# Patient Record
Sex: Female | Born: 1955 | Race: Black or African American | Hispanic: No | Marital: Single | Smoking: Former smoker
Health system: Southern US, Community
[De-identification: ages and names within clinical notes are randomized; demographics above are authoritative.]

## PROBLEM LIST (undated history)

## (undated) DIAGNOSIS — F419 Anxiety disorder, unspecified: Secondary | ICD-10-CM

## (undated) DIAGNOSIS — G4733 Obstructive sleep apnea (adult) (pediatric): Secondary | ICD-10-CM

## (undated) DIAGNOSIS — E119 Type 2 diabetes mellitus without complications: Secondary | ICD-10-CM

## (undated) DIAGNOSIS — H409 Unspecified glaucoma: Secondary | ICD-10-CM

## (undated) DIAGNOSIS — F32A Depression, unspecified: Secondary | ICD-10-CM

## (undated) DIAGNOSIS — R0600 Dyspnea, unspecified: Secondary | ICD-10-CM

## (undated) DIAGNOSIS — I509 Heart failure, unspecified: Secondary | ICD-10-CM

## (undated) DIAGNOSIS — I5030 Unspecified diastolic (congestive) heart failure: Secondary | ICD-10-CM

## (undated) DIAGNOSIS — I671 Cerebral aneurysm, nonruptured: Secondary | ICD-10-CM

## (undated) DIAGNOSIS — G4700 Insomnia, unspecified: Secondary | ICD-10-CM

## (undated) DIAGNOSIS — R079 Chest pain, unspecified: Secondary | ICD-10-CM

## (undated) DIAGNOSIS — I10 Essential (primary) hypertension: Secondary | ICD-10-CM

## (undated) DIAGNOSIS — G43109 Migraine with aura, not intractable, without status migrainosus: Secondary | ICD-10-CM

## (undated) DIAGNOSIS — K219 Gastro-esophageal reflux disease without esophagitis: Secondary | ICD-10-CM

## (undated) DIAGNOSIS — R011 Cardiac murmur, unspecified: Secondary | ICD-10-CM

## (undated) DIAGNOSIS — R32 Unspecified urinary incontinence: Secondary | ICD-10-CM

## (undated) DIAGNOSIS — J45909 Unspecified asthma, uncomplicated: Secondary | ICD-10-CM

## (undated) DIAGNOSIS — E785 Hyperlipidemia, unspecified: Secondary | ICD-10-CM

## (undated) DIAGNOSIS — J302 Other seasonal allergic rhinitis: Secondary | ICD-10-CM

## (undated) DIAGNOSIS — D649 Anemia, unspecified: Secondary | ICD-10-CM

## (undated) DIAGNOSIS — G629 Polyneuropathy, unspecified: Secondary | ICD-10-CM

## (undated) DIAGNOSIS — Z87442 Personal history of urinary calculi: Secondary | ICD-10-CM

## (undated) DIAGNOSIS — I219 Acute myocardial infarction, unspecified: Secondary | ICD-10-CM

## (undated) DIAGNOSIS — R51 Headache: Secondary | ICD-10-CM

## (undated) DIAGNOSIS — Z7982 Long term (current) use of aspirin: Secondary | ICD-10-CM

## (undated) DIAGNOSIS — G459 Transient cerebral ischemic attack, unspecified: Secondary | ICD-10-CM

## (undated) DIAGNOSIS — R519 Headache, unspecified: Secondary | ICD-10-CM

## (undated) DIAGNOSIS — F329 Major depressive disorder, single episode, unspecified: Secondary | ICD-10-CM

## (undated) DIAGNOSIS — M1990 Unspecified osteoarthritis, unspecified site: Secondary | ICD-10-CM

## (undated) DIAGNOSIS — N189 Chronic kidney disease, unspecified: Secondary | ICD-10-CM

## (undated) DIAGNOSIS — J449 Chronic obstructive pulmonary disease, unspecified: Secondary | ICD-10-CM

## (undated) DIAGNOSIS — G4730 Sleep apnea, unspecified: Secondary | ICD-10-CM

## (undated) DIAGNOSIS — M7581 Other shoulder lesions, right shoulder: Secondary | ICD-10-CM

## (undated) DIAGNOSIS — N183 Chronic kidney disease, stage 3 unspecified: Secondary | ICD-10-CM

## (undated) DIAGNOSIS — M1612 Unilateral primary osteoarthritis, left hip: Secondary | ICD-10-CM

## (undated) DIAGNOSIS — I639 Cerebral infarction, unspecified: Secondary | ICD-10-CM

## (undated) DIAGNOSIS — S46009A Unspecified injury of muscle(s) and tendon(s) of the rotator cuff of unspecified shoulder, initial encounter: Secondary | ICD-10-CM

## (undated) DIAGNOSIS — Z7901 Long term (current) use of anticoagulants: Secondary | ICD-10-CM

## (undated) HISTORY — PX: TOTAL KNEE ARTHROPLASTY: SHX125

## (undated) HISTORY — DX: Chest pain, unspecified: R07.9

## (undated) HISTORY — PX: CHOLECYSTECTOMY: SHX55

## (undated) HISTORY — DX: Unspecified diastolic (congestive) heart failure: I50.30

## (undated) HISTORY — PX: CARDIAC CATHETERIZATION: SHX172

## (undated) HISTORY — PX: JOINT REPLACEMENT: SHX530

## (undated) HISTORY — PX: TONSILLECTOMY: SUR1361

---

## 2003-02-05 DIAGNOSIS — I10 Essential (primary) hypertension: Secondary | ICD-10-CM | POA: Insufficient documentation

## 2003-12-22 ENCOUNTER — Other Ambulatory Visit: Payer: Self-pay

## 2004-01-18 ENCOUNTER — Other Ambulatory Visit: Payer: Self-pay

## 2004-06-12 ENCOUNTER — Other Ambulatory Visit: Payer: Self-pay

## 2004-10-07 ENCOUNTER — Emergency Department: Payer: Self-pay | Admitting: Emergency Medicine

## 2004-12-12 ENCOUNTER — Ambulatory Visit: Payer: Self-pay | Admitting: Anesthesiology

## 2004-12-14 ENCOUNTER — Ambulatory Visit: Payer: Self-pay | Admitting: Anesthesiology

## 2005-04-12 ENCOUNTER — Emergency Department: Payer: Self-pay | Admitting: Emergency Medicine

## 2005-09-05 ENCOUNTER — Other Ambulatory Visit: Payer: Self-pay

## 2005-09-05 ENCOUNTER — Inpatient Hospital Stay: Payer: Self-pay | Admitting: Internal Medicine

## 2005-09-21 ENCOUNTER — Emergency Department: Payer: Self-pay | Admitting: Emergency Medicine

## 2005-09-21 ENCOUNTER — Other Ambulatory Visit: Payer: Self-pay

## 2005-12-10 ENCOUNTER — Emergency Department: Payer: Self-pay | Admitting: General Practice

## 2006-07-07 ENCOUNTER — Emergency Department: Payer: Self-pay | Admitting: Emergency Medicine

## 2006-08-03 ENCOUNTER — Emergency Department: Payer: Self-pay | Admitting: Emergency Medicine

## 2006-08-07 ENCOUNTER — Emergency Department: Payer: Self-pay | Admitting: Internal Medicine

## 2006-09-16 ENCOUNTER — Emergency Department: Payer: Self-pay | Admitting: Emergency Medicine

## 2006-10-19 ENCOUNTER — Ambulatory Visit: Payer: Self-pay | Admitting: Nurse Practitioner

## 2007-01-22 ENCOUNTER — Emergency Department: Payer: Self-pay | Admitting: Emergency Medicine

## 2007-01-22 ENCOUNTER — Other Ambulatory Visit: Payer: Self-pay

## 2007-02-01 ENCOUNTER — Other Ambulatory Visit: Payer: Self-pay

## 2007-02-01 ENCOUNTER — Emergency Department: Payer: Self-pay | Admitting: Emergency Medicine

## 2007-05-25 ENCOUNTER — Emergency Department: Payer: Self-pay | Admitting: Emergency Medicine

## 2007-06-16 ENCOUNTER — Emergency Department: Payer: Self-pay | Admitting: Internal Medicine

## 2007-08-30 ENCOUNTER — Emergency Department: Payer: Self-pay | Admitting: Emergency Medicine

## 2008-01-14 ENCOUNTER — Emergency Department: Payer: Self-pay | Admitting: Emergency Medicine

## 2008-01-16 ENCOUNTER — Emergency Department (HOSPITAL_COMMUNITY): Admission: EM | Admit: 2008-01-16 | Discharge: 2008-01-16 | Payer: Self-pay | Admitting: Emergency Medicine

## 2008-01-30 ENCOUNTER — Ambulatory Visit (HOSPITAL_COMMUNITY): Admission: RE | Admit: 2008-01-30 | Discharge: 2008-01-30 | Payer: Self-pay | Admitting: Orthopedic Surgery

## 2008-03-24 IMAGING — CT CT CERVICAL SPINE WITHOUT CONTRAST
3 series · 16 of 33 positions shown, 19 images · non-contrast
Comparison: none

REASON FOR EXAM: pain
COMMENTS:   LMP: Post-Menopausal

PROCEDURE:     CT  - CT CERVICAL SPINE WO  - August 31, 2007 [DATE]
RESULT:
HISTORY: Pain.

[Series 3: axial · axial · 0.30mm/px · z∈[-310,-162]mm · 8 of 94 slices shown, 10 images]
[im 8/94  soft-tissue]
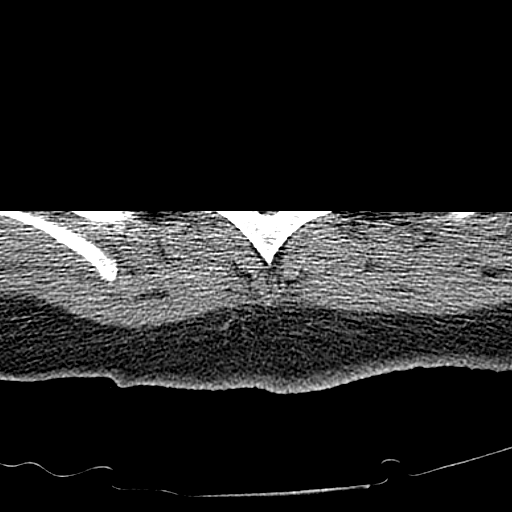
[im 8/94  bone]
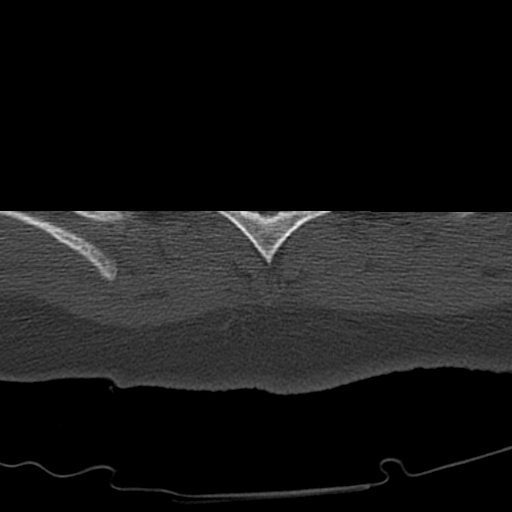
[im 22/94  bone]
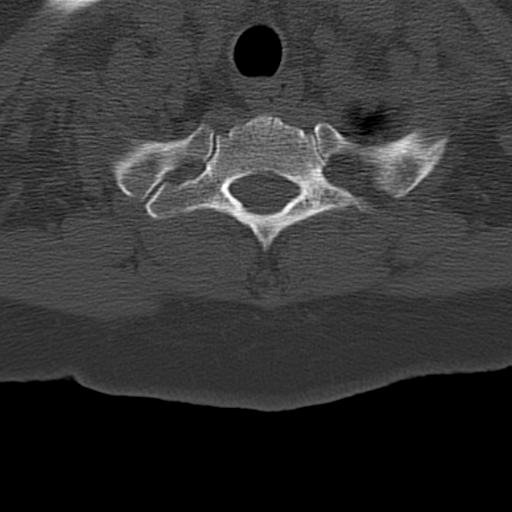
[im 29/94  bone]
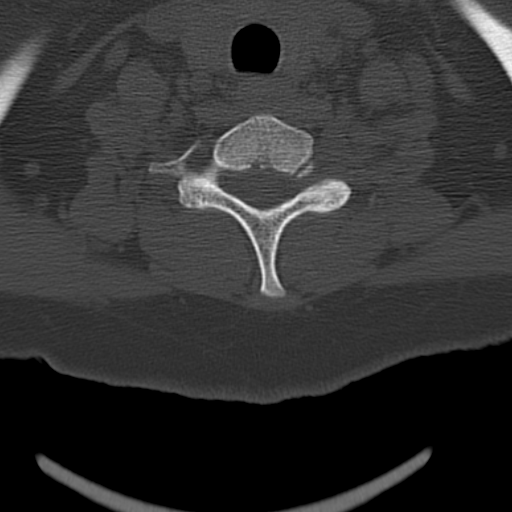
[im 43/94  bone]
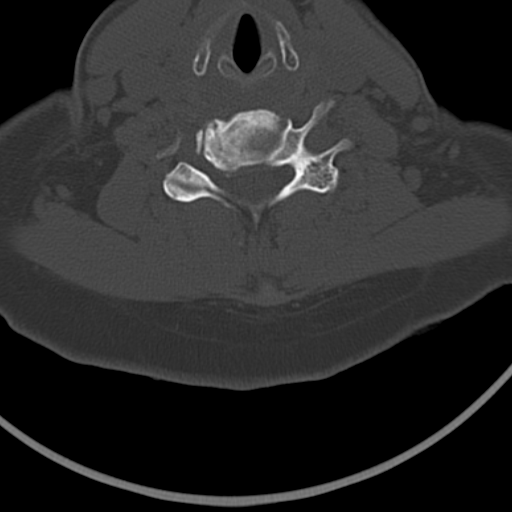
[im 51/94  soft-tissue]
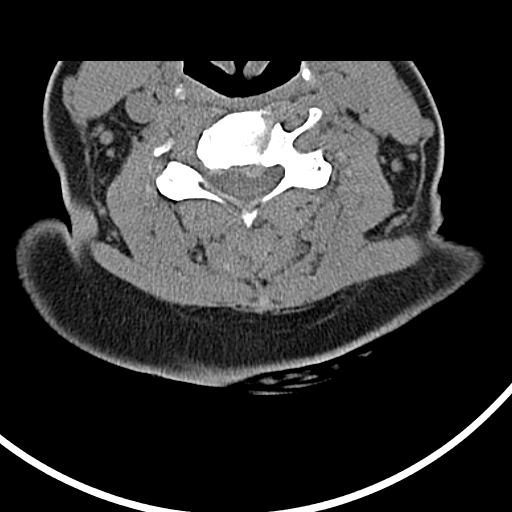
[im 51/94  bone]
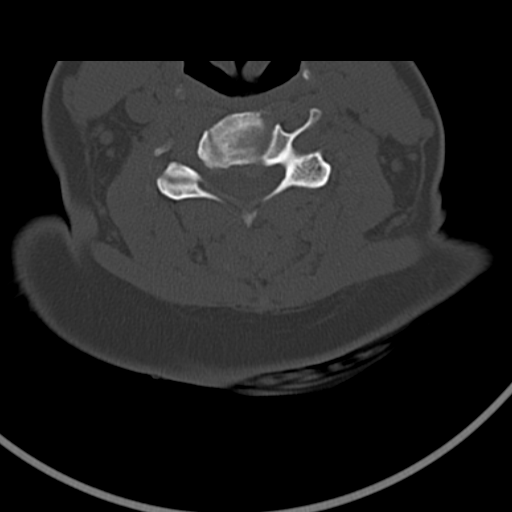
[im 65/94  bone]
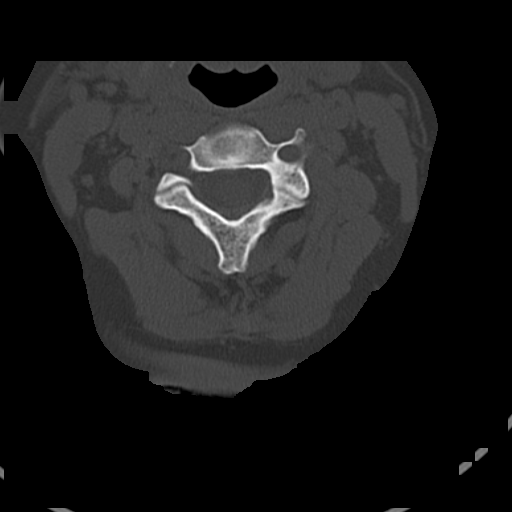
[im 72/94  bone]
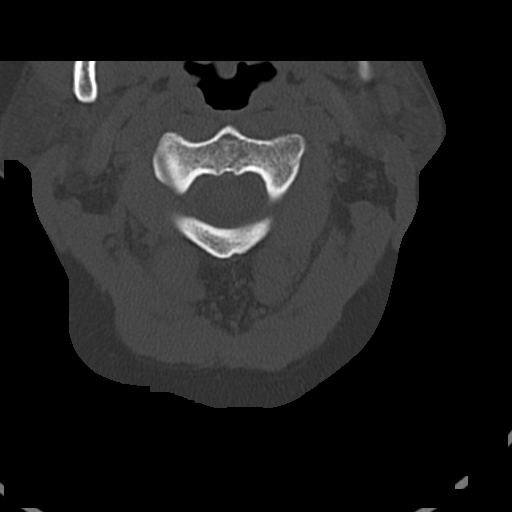
[im 86/94  bone]
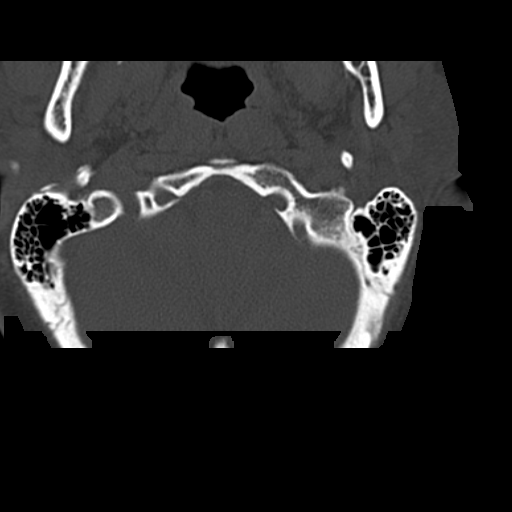

[Series 4: coronal · coronal · 0.46mm/px · 3 of 37 slices shown]
[im 8/37  bone]
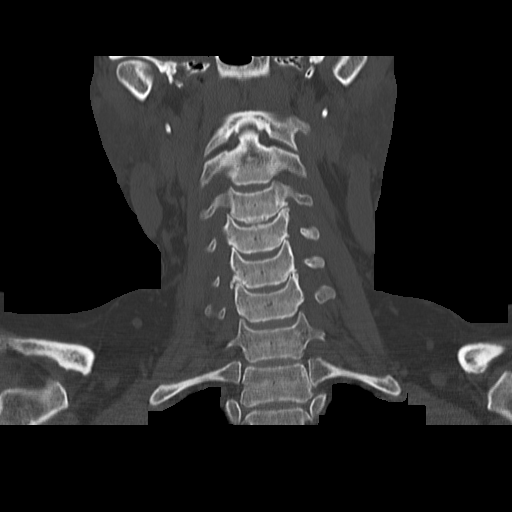
[im 15/37  bone]
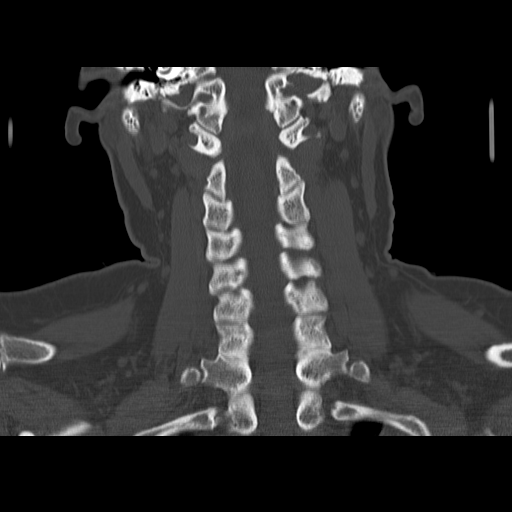
[im 22/37  bone]
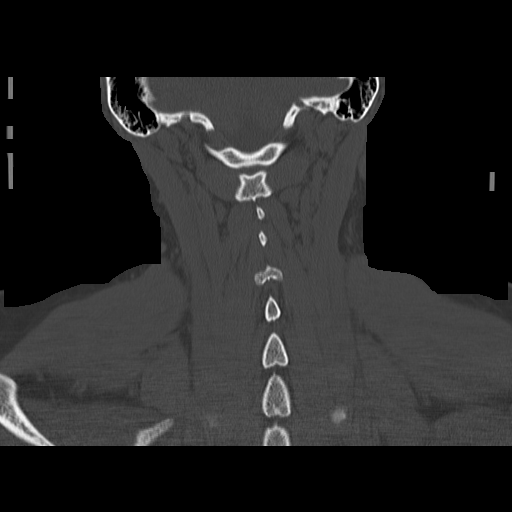

[Series 5: sagittal · sagittal · 0.46mm/px · 5 of 41 slices shown, 6 images]
[im 11/41  bone]
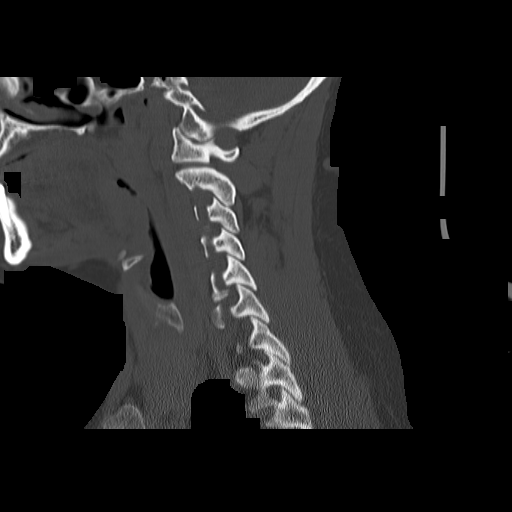
[im 14/41  bone]
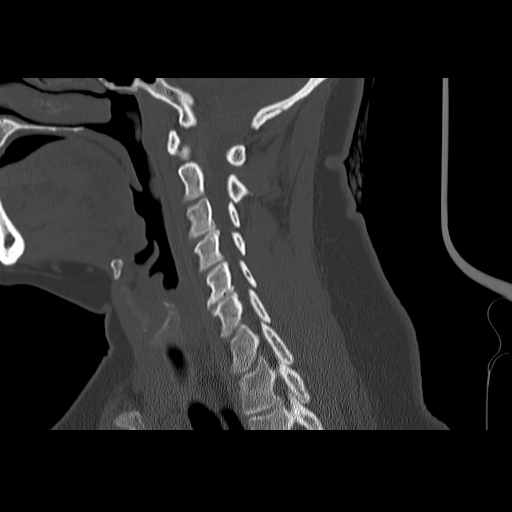
[im 17/41  bone]
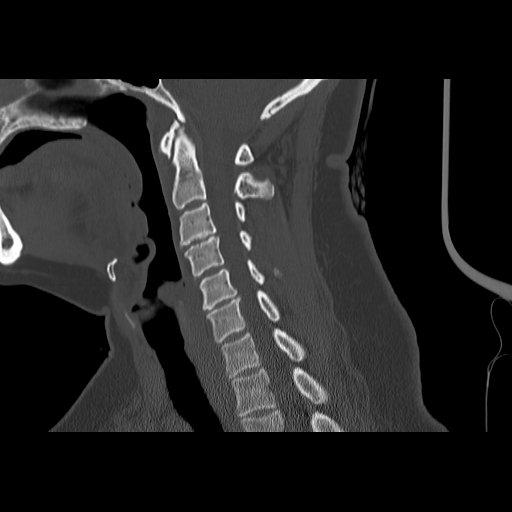
[im 21/41  soft-tissue]
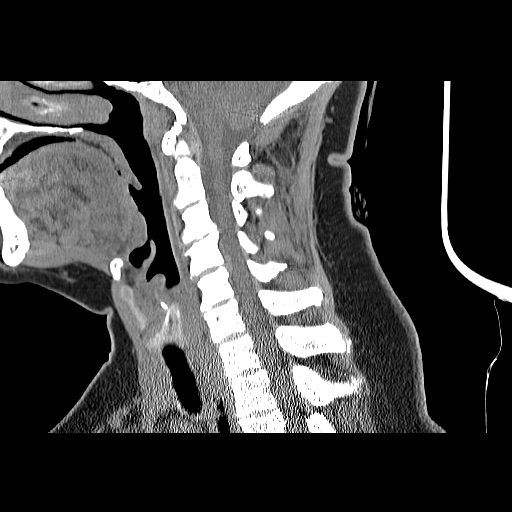
[im 21/41  bone]
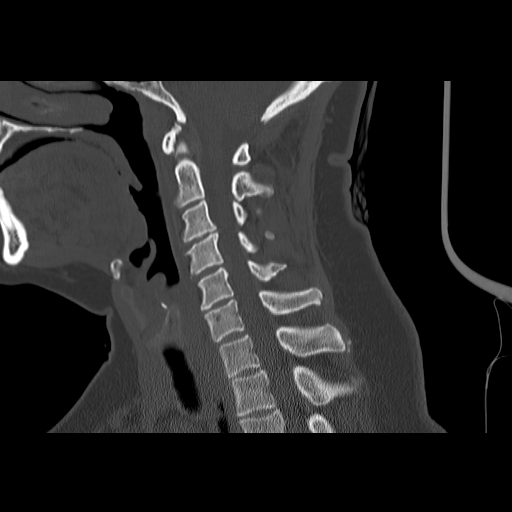
[im 27/41  bone]
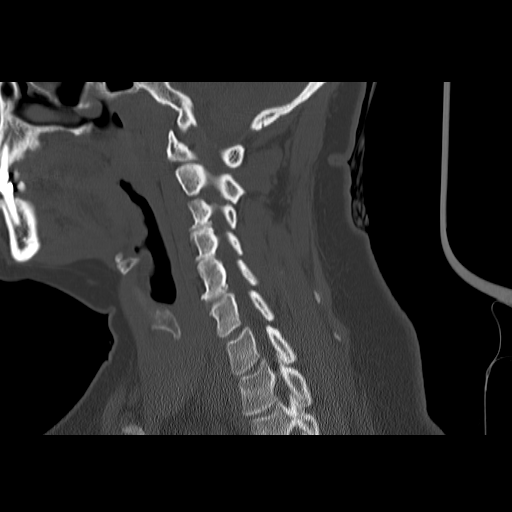

[16 of 33 positions shown; findings below may reference images not displayed]

COMPARISON STUDIES: No recent.

PROCEDURE AND FINDINGS: Standard CT of the cervical spine is obtained. No
evidence of displaced fracture. Diffuse degenerative change is noted.
Multilevel disc degeneration with degenerative endplate osteophyte formation
is noted. Multilevel annular bulge cannot be excluded.
IMPRESSION: 1)Diffuse severe degenerative changes of the cervical spine. MRI may prove
useful for further evaluation.

2)No acute abnormality.

## 2008-03-27 ENCOUNTER — Emergency Department: Payer: Self-pay | Admitting: Unknown Physician Specialty

## 2008-03-27 ENCOUNTER — Other Ambulatory Visit: Payer: Self-pay

## 2008-06-10 ENCOUNTER — Emergency Department: Payer: Self-pay | Admitting: Unknown Physician Specialty

## 2008-09-24 ENCOUNTER — Ambulatory Visit: Payer: Self-pay | Admitting: Family Medicine

## 2008-10-06 ENCOUNTER — Observation Stay: Payer: Self-pay | Admitting: *Deleted

## 2008-11-13 HISTORY — PX: BRAIN SURGERY: SHX531

## 2009-01-06 ENCOUNTER — Emergency Department: Payer: Self-pay | Admitting: Emergency Medicine

## 2009-01-31 ENCOUNTER — Emergency Department: Payer: Self-pay | Admitting: Emergency Medicine

## 2009-04-05 DIAGNOSIS — I725 Aneurysm of other precerebral arteries: Secondary | ICD-10-CM

## 2009-04-05 HISTORY — DX: Aneurysm of other precerebral arteries: I72.5

## 2009-06-01 ENCOUNTER — Ambulatory Visit: Payer: Self-pay | Admitting: Internal Medicine

## 2009-08-13 ENCOUNTER — Emergency Department: Payer: Self-pay | Admitting: Unknown Physician Specialty

## 2009-09-05 ENCOUNTER — Emergency Department: Payer: Self-pay | Admitting: Emergency Medicine

## 2010-04-20 ENCOUNTER — Emergency Department: Payer: Self-pay | Admitting: Emergency Medicine

## 2010-05-21 ENCOUNTER — Emergency Department: Payer: Self-pay | Admitting: Emergency Medicine

## 2010-08-24 ENCOUNTER — Ambulatory Visit: Payer: Self-pay | Admitting: Family Medicine

## 2010-09-14 ENCOUNTER — Ambulatory Visit: Payer: Self-pay | Admitting: Family Medicine

## 2010-12-07 ENCOUNTER — Ambulatory Visit: Payer: Self-pay

## 2010-12-13 ENCOUNTER — Ambulatory Visit: Payer: Self-pay

## 2011-01-29 ENCOUNTER — Emergency Department: Payer: Self-pay | Admitting: Emergency Medicine

## 2011-02-05 ENCOUNTER — Emergency Department: Payer: Self-pay | Admitting: Internal Medicine

## 2011-07-24 ENCOUNTER — Emergency Department: Payer: Self-pay | Admitting: Emergency Medicine

## 2011-09-10 ENCOUNTER — Inpatient Hospital Stay: Payer: Self-pay | Admitting: Internal Medicine

## 2011-09-11 DIAGNOSIS — Z9889 Other specified postprocedural states: Secondary | ICD-10-CM

## 2011-09-11 DIAGNOSIS — I2510 Atherosclerotic heart disease of native coronary artery without angina pectoris: Secondary | ICD-10-CM

## 2011-09-11 HISTORY — DX: Atherosclerotic heart disease of native coronary artery without angina pectoris: I25.10

## 2011-09-11 HISTORY — PX: LEFT HEART CATH AND CORONARY ANGIOGRAPHY: CATH118249

## 2011-09-11 HISTORY — DX: Other specified postprocedural states: Z98.890

## 2011-10-11 ENCOUNTER — Ambulatory Visit: Payer: Self-pay | Admitting: Family Medicine

## 2011-11-01 ENCOUNTER — Ambulatory Visit: Payer: Self-pay | Admitting: Gastroenterology

## 2011-11-16 ENCOUNTER — Emergency Department: Payer: Self-pay | Admitting: Emergency Medicine

## 2011-12-26 ENCOUNTER — Encounter: Payer: Self-pay | Admitting: Family Medicine

## 2012-01-12 ENCOUNTER — Encounter: Payer: Self-pay | Admitting: Family Medicine

## 2012-02-12 ENCOUNTER — Encounter: Payer: Self-pay | Admitting: Family Medicine

## 2012-04-21 ENCOUNTER — Emergency Department: Payer: Self-pay | Admitting: Emergency Medicine

## 2012-06-20 ENCOUNTER — Ambulatory Visit: Payer: Self-pay | Admitting: Specialist

## 2012-06-20 LAB — CBC WITH DIFFERENTIAL/PLATELET
Basophil %: 0.5 %
Eosinophil #: 0.2 10*3/uL (ref 0.0–0.7)
Eosinophil %: 6.9 %
HCT: 40 % (ref 35.0–47.0)
HGB: 13.1 g/dL (ref 12.0–16.0)
Lymphocyte #: 1.2 10*3/uL (ref 1.0–3.6)
MCH: 28 pg (ref 26.0–34.0)
MCHC: 32.7 g/dL (ref 32.0–36.0)
MCV: 85 fL (ref 80–100)
Monocyte #: 0.3 x10 3/mm (ref 0.2–0.9)
Neutrophil #: 1.5 10*3/uL (ref 1.4–6.5)
Neutrophil %: 44.9 %
RBC: 4.68 10*6/uL (ref 3.80–5.20)

## 2012-06-20 LAB — BASIC METABOLIC PANEL
BUN: 15 mg/dL (ref 7–18)
Creatinine: 1.19 mg/dL (ref 0.60–1.30)
EGFR (Non-African Amer.): 50 — ABNORMAL LOW
Glucose: 97 mg/dL (ref 65–99)
Potassium: 3.7 mmol/L (ref 3.5–5.1)
Sodium: 143 mmol/L (ref 136–145)

## 2012-06-27 ENCOUNTER — Ambulatory Visit: Payer: Self-pay | Admitting: Specialist

## 2012-06-28 LAB — PATHOLOGY REPORT

## 2012-10-14 ENCOUNTER — Ambulatory Visit: Payer: Self-pay | Admitting: Family Medicine

## 2012-12-19 ENCOUNTER — Emergency Department: Payer: Self-pay | Admitting: Emergency Medicine

## 2013-02-10 ENCOUNTER — Emergency Department: Payer: Self-pay | Admitting: Emergency Medicine

## 2013-02-11 LAB — URINALYSIS, COMPLETE
Glucose,UR: NEGATIVE mg/dL (ref 0–75)
Leukocyte Esterase: NEGATIVE
WBC UR: 4 /HPF (ref 0–5)

## 2013-11-13 ENCOUNTER — Emergency Department: Payer: Self-pay | Admitting: Emergency Medicine

## 2013-11-13 LAB — RAPID INFLUENZA A&B ANTIGENS

## 2014-03-20 ENCOUNTER — Emergency Department: Payer: Self-pay | Admitting: Emergency Medicine

## 2014-11-04 ENCOUNTER — Ambulatory Visit: Payer: Self-pay | Admitting: Family Medicine

## 2015-03-02 NOTE — Op Note (Signed)
PATIENT NAME:  Kristine Rangel, Vylette A MR#:  [id number] DATE OF BIRTH:  [birth year]  DATE OF PROCEDURE:  06/27/2012  PREOPERATIVE DIAGNOSIS: Recurrent volar radial ganglion, left wrist.  POSTOPERATIVE DIAGNOSIS: Recurrent volar radial ganglion, left wrist.  PROCEDURE PERFORMED:  Excision of deep volar radial ganglion, left wrist.   SURGEON: Valinda HoarHoward E. Maxwell Lemen, M.D.   ANESTHESIA: General endotracheal.   COMPLICATIONS: None.   DRAINS: None.   DESCRIPTION OF PROCEDURE: The patient was brought to the Operating Room where she underwent satisfactory general endotracheal anesthesia in the supine position. The left arm was prepped and draped in sterile fashion and an Esmarch applied. The tourniquet was inflated to 250 mmHg. Tourniquet times were 23 minutes followed by a second session of 17 minutes. The previous longitudinal incision was reopened carefully and blunt dissection carried out. She had a large recurrent ganglion which was adherent to surrounding tissues. The flexor radialis tendon sheath was opened and the tendon retracted. The radial artery was identified proximal and distal to the cyst and was found to be totally adherent to the cyst and involving the cyst capsule. This was carefully dissected out away from the ganglion itself and isolated with a vessel loop drain. A small hole in the vessel wall was identified during this process due to the extreme friability of the vessel. The cyst was then followed distally and deep down into the radiocarpal joint. It was excised along with a portion of the capsule. The rongeur was used to remove any remaining tissue in the capsule. The wound was irrigated. Sponges were applied to the wound and the tourniquet was deflated with good return of blood flow to the hand. After releasing the pressure on the artery, there was some bleeding from a hole in the artery. The tourniquet was reinflated for the second 17 minutes and a small hole in the vessel wall was repaired with  6-0 Prolene suture. The tourniquet was deflated again and there was no appreciable bleeding at all and there was good pulsation. The wound was again irrigated. There was no appreciable bleeding. Electrocautery had been used on small veins. The wound was then closed with 4-0 Vicryl and 5-0 nylon, 0.5% Marcaine was placed in the wound, and a dry sterile compression hand dressing and volar splint was applied. The tourniquet was already deflated and there was excellent circulation in the hand. The patient was awakened and taken to recovery in good condition.  ____________________________ Valinda HoarHoward E. Camary Sosa, MD hem:slb D: 06/27/2012 10:46:11 ET T: 06/27/2012 13:33:17 ET JOB#: [id number]  cc: Valinda HoarHoward E. Infant Doane, MD, <Dictator> Valinda HoarHOWARD E Sahmya Arai MD ELECTRONICALLY SIGNED 06/28/2012 10:59

## 2015-05-05 ENCOUNTER — Emergency Department: Payer: Medicare Other

## 2015-05-05 ENCOUNTER — Encounter: Payer: Self-pay | Admitting: Emergency Medicine

## 2015-05-05 ENCOUNTER — Emergency Department
Admission: EM | Admit: 2015-05-05 | Discharge: 2015-05-05 | Disposition: A | Discharge status: Home / Self Care | Payer: Medicare Other | Attending: Emergency Medicine | Admitting: Emergency Medicine

## 2015-05-05 ENCOUNTER — Other Ambulatory Visit: Payer: Self-pay

## 2015-05-05 DIAGNOSIS — M79602 Pain in left arm: Secondary | ICD-10-CM | POA: Diagnosis not present

## 2015-05-05 DIAGNOSIS — I10 Essential (primary) hypertension: Secondary | ICD-10-CM | POA: Diagnosis not present

## 2015-05-05 DIAGNOSIS — R079 Chest pain, unspecified: Secondary | ICD-10-CM | POA: Diagnosis not present

## 2015-05-05 DIAGNOSIS — E119 Type 2 diabetes mellitus without complications: Secondary | ICD-10-CM | POA: Insufficient documentation

## 2015-05-05 DIAGNOSIS — M542 Cervicalgia: Secondary | ICD-10-CM | POA: Diagnosis not present

## 2015-05-05 DIAGNOSIS — Z880 Allergy status to penicillin: Secondary | ICD-10-CM | POA: Diagnosis not present

## 2015-05-05 DIAGNOSIS — Z87891 Personal history of nicotine dependence: Secondary | ICD-10-CM | POA: Insufficient documentation

## 2015-05-05 DIAGNOSIS — I252 Old myocardial infarction: Secondary | ICD-10-CM | POA: Insufficient documentation

## 2015-05-05 HISTORY — DX: Gastro-esophageal reflux disease without esophagitis: K21.9

## 2015-05-05 HISTORY — DX: Essential (primary) hypertension: I10

## 2015-05-05 HISTORY — DX: Hyperlipidemia, unspecified: E78.5

## 2015-05-05 HISTORY — DX: Acute myocardial infarction, unspecified: I21.9

## 2015-05-05 HISTORY — DX: Type 2 diabetes mellitus without complications: E11.9

## 2015-05-05 HISTORY — DX: Unspecified asthma, uncomplicated: J45.909

## 2015-05-05 HISTORY — DX: Cerebral infarction, unspecified: I63.9

## 2015-05-05 LAB — CBC
HEMATOCRIT: 41 % (ref 35.0–47.0)
HEMOGLOBIN: 12.9 g/dL (ref 12.0–16.0)
MCH: 27.1 pg (ref 26.0–34.0)
MCHC: 31.6 g/dL — ABNORMAL LOW (ref 32.0–36.0)
MCV: 86 fL (ref 80.0–100.0)
PLATELETS: 280 10*3/uL (ref 150–440)
RBC: 4.77 MIL/uL (ref 3.80–5.20)
RDW: 15 % — AB (ref 11.5–14.5)
WBC: 4.3 10*3/uL (ref 3.6–11.0)

## 2015-05-05 LAB — BASIC METABOLIC PANEL
ANION GAP: 7 (ref 5–15)
BUN: 21 mg/dL — ABNORMAL HIGH (ref 6–20)
CHLORIDE: 111 mmol/L (ref 101–111)
CO2: 24 mmol/L (ref 22–32)
CREATININE: 1.03 mg/dL — AB (ref 0.44–1.00)
Calcium: 9.9 mg/dL (ref 8.9–10.3)
GFR calc non Af Amer: 57 mL/min — ABNORMAL LOW (ref >60)
Glucose, Bld: 94 mg/dL (ref 65–99)
POTASSIUM: 3.8 mmol/L (ref 3.5–5.1)
SODIUM: 142 mmol/L (ref 135–145)

## 2015-05-05 LAB — TROPONIN I

## 2015-05-05 MED ORDER — NITROGLYCERIN 0.4 MG SL SUBL
SUBLINGUAL_TABLET | SUBLINGUAL | Status: AC
  Filled 2015-05-05: qty 1

## 2015-05-05 MED ORDER — NITROGLYCERIN 0.4 MG SL SUBL
0.4000 mg | SUBLINGUAL_TABLET | SUBLINGUAL | Status: DC | PRN
  Administered 2015-05-05: 0.4 mg via SUBLINGUAL

## 2015-05-05 MED ORDER — IOHEXOL 240 MG/ML SOLN: 25.0000 mL | Freq: Once | INTRAMUSCULAR | Status: DC | PRN

## 2015-05-05 MED ORDER — IOHEXOL 350 MG/ML SOLN
100.0000 mL | Freq: Once | INTRAVENOUS | Status: AC | PRN
  Administered 2015-05-05: 100 mL via INTRAVENOUS

## 2015-05-05 NOTE — ED Notes (Signed)
Patient c/o left sided sharp chest pain since Sunday. Radiates to left arm, neck and back. Hx MI; states this feels similar.

## 2015-05-05 NOTE — ED Notes (Signed)
Pt reports that she has had left arm, neck and chest pain x 3-4 days.  Reports increased pain in her neck and arm with raising her arm up.  nontender on palpation.  Pt ambulatory from wheelchair to bed without difficulty.  Pt drove herself here.  Denies change in pain since Sunday.

## 2015-05-05 NOTE — ED Provider Notes (Signed)
[facility] Emergency Department Provider Note  ____________________________________________  Time seen: 12:30 PM  I have reviewed the triage vital signs and the nursing notes.   HISTORY  Chief Complaint Chest Pain    HPI Kristine Rangel is a 59 y.o. female who complains of chest pain for 4 days. The pain was rapid in onset, feels sharp in her central chest radiating to her back and left arm. It hurts when she moves her left arm. It is not exertional or pleuritic. No fever chills cough shortness of breath diaphoresis or vomiting. She does feel slightly nauseated. She reports having a heart attack in the 90s, and although she does not remember the circumstances of that or what was done are found at the time, she remembers that this feels similar. She follows up with Dr. call would for cardiology, and has an appointment on July 5.  The pain is moderate in intensity at this time. No aggravating or alleviating factors. Only associated with nausea.     Past Medical History  Diagnosis Date  . MI (myocardial infarction)   . Hypertension   . Hyperlipidemia   . Diabetes mellitus without complication   . GERD (gastroesophageal reflux disease)   . Stroke     TIA's    There are no active problems to display for this patient.   Past Surgical History  Procedure Laterality Date  . Brain surgery      anuerysm repair  . Joint replacement      bilateral knees    No current outpatient prescriptions on file.  Allergies Penicillins  No family history on file.  Social History History  Substance Use Topics  . Smoking status: Former Games developer  . Smokeless tobacco: Not on file  . Alcohol Use: No    Review of Systems  Constitutional: No fever or chills. No weight changes Eyes:No blurry vision or double vision.  ENT: No sore throat. Cardiovascular: Chest pain as above. Respiratory: No dyspnea or cough. Gastrointestinal: Negative for abdominal pain, vomiting  and diarrhea.  No BRBPR or melena. Genitourinary: Negative for dysuria, urinary retention, bloody urine, or difficulty urinating. Musculoskeletal: Negative for back pain. No joint swelling or pain. Skin: Negative for rash. Neurological: Negative for headaches, focal weakness or numbness. Psychiatric:No anxiety or depression.   Endocrine:No hot/cold intolerance, changes in energy, or sleep difficulty.  10-point ROS otherwise negative.  ____________________________________________   PHYSICAL EXAM:  VITAL SIGNS: ED Triage Vitals  Enc Vitals Group     BP 05/05/15 1117 118/59 mmHg     Pulse Rate 05/05/15 1117 75     Resp 05/05/15 1117 15     Temp 05/05/15 1117 97.5 F (36.4 C)     Temp Source 05/05/15 1117 Oral     SpO2 05/05/15 1117 97 %     Weight 05/05/15 1117 275 lb (124.739 kg)     Height 05/05/15 1117 5\' 5"  (1.651 m)     Head Cir --      Peak Flow --      Pain Score 05/05/15 1112 10     Pain Loc --      Pain Edu? --      Excl. in GC? --      Constitutional: Alert and oriented. Well appearing and in no distress. Eyes: No scleral icterus. No conjunctival pallor. PERRL. EOMI ENT   Head: Normocephalic and atraumatic.   Nose: No congestion/rhinnorhea. No septal hematoma   Mouth/Throat: MMM, no pharyngeal erythema. No peritonsillar mass.  No uvula shift.   Neck: No stridor. No SubQ emphysema. No meningismus. Hematological/Lymphatic/Immunilogical: No cervical lymphadenopathy. Cardiovascular: RRR. Normal bilateral dorsalis pedis pulses. Normal 2+ right radial pulse, nonpalpable left radial pulse, with good cap refill and warmth in the left hand. Bilateral upper extremity blood pressures are equivalent around 130/60.Marland Kitchen No murmurs, rubs, or gallops. Respiratory: Normal respiratory effort without tachypnea nor retractions. Breath sounds are clear and equal bilaterally. No wheezes/rales/rhonchi. Chest wall nontender Gastrointestinal: Soft and nontender. No distention.  There is no CVA tenderness.  No rebound, rigidity, or guarding. Genitourinary: deferred Musculoskeletal: Left neck tender with spasm of left trapezius. No head turning or deviation. Pain increased with abduction of left arm above head. normal range of motion in all extremities. No joint effusions.  No lower extremity tenderness.  No edema. Neurologic:   Normal speech and language.  CN 2-10 normal. Motor grossly intact. No pronator drift.  Normal gait. No gross focal neurologic deficits are appreciated.  Skin:  Skin is warm, dry and intact. No rash noted.  No petechiae, purpura, or bullae. Psychiatric: Mood and affect are normal. Speech and behavior are normal. Patient exhibits appropriate insight and judgment.  ____________________________________________    LABS (pertinent positives/negatives) (all labs ordered are listed, but only abnormal results are displayed) Labs Reviewed  BASIC METABOLIC PANEL - Abnormal; Notable for the following:    BUN 21 (*)    Creatinine, Ser 1.03 (*)    GFR calc non Af Amer 57 (*)    All other components within normal limits  CBC - Abnormal; Notable for the following:    MCHC 31.6 (*)    RDW 15.0 (*)    All other components within normal limits  TROPONIN I   ____________________________________________   EKG  Interpreted by me  Date: 05/05/2015  Rate: 76  Rhythm: normal sinus rhythm  QRS Axis: normal  Intervals: normal  ST/T Wave abnormalities: normal  Conduction Disutrbances: none  Narrative Interpretation: unremarkable      ____________________________________________    RADIOLOGY  CT angiography chest unremarkable  ____________________________________________   PROCEDURES  ____________________________________________   INITIAL IMPRESSION / ASSESSMENT AND PLAN / ED COURSE  Pertinent labs & imaging results that were available during my care of the patient were reviewed by me and considered in my medical decision making  (see chart for details).  Patient presents with constant chest pain for 4 days. By history this is highly unlikely to be cardiac in nature, but there is a concern that it could be due to aortic dissection. Low suspicion of PE as she has completely normal vital signs with a heart rate of 60, respiratory rate of 12, oxygen saturation of 100%, blood pressure 120/70 and symptoms are not pleuritic and there is no shortness of breath. We will do troponins and CT angiogram of the chest to evaluate. We'll give her nitroglycerin to attempt symptom relief in the meantime. ----------------------------------------- 3:27 PM on 05/05/2015 -----------------------------------------  Workup negative. Troponin negative 2. Chest pain improving. Very likely musculoskeletal in nature given the exam findings and the negative workup. Patient will try gentle range of motion exercises and follow up with cardiology as scheduled for continued monitoring of her symptoms. ____________________________________________   FINAL CLINICAL IMPRESSION(S) / ED DIAGNOSES  Final diagnoses:  Chest pain at rest   musculoskeletal pain in the chest neck and left arm    Sharman Cheek, MD 05/05/15 1529

## 2015-05-05 NOTE — ED Notes (Signed)
AAOx3.  Skin warm and dry.  Ambulates with easy, steady gait with cane.  No SOB/ DOE.  D/C home

## 2015-05-05 NOTE — Discharge Instructions (Signed)

## 2015-08-04 ENCOUNTER — Other Ambulatory Visit: Payer: Self-pay | Admitting: Orthopedic Surgery

## 2015-08-04 DIAGNOSIS — M5033 Other cervical disc degeneration, cervicothoracic region: Secondary | ICD-10-CM

## 2015-08-13 ENCOUNTER — Ambulatory Visit: Admission: RE | Admit: 2015-08-13 | Payer: Medicare Other | Source: Ambulatory Visit

## 2015-08-17 ENCOUNTER — Ambulatory Visit: Admission: RE | Admit: 2015-08-17 | Payer: Medicare Other | Source: Ambulatory Visit

## 2015-08-18 ENCOUNTER — Ambulatory Visit
Admission: RE | Admit: 2015-08-18 | Discharge: 2015-08-18 | Disposition: A | Discharge status: Home / Self Care | Payer: Medicare Other | Source: Ambulatory Visit | Attending: Orthopedic Surgery | Admitting: Orthopedic Surgery

## 2015-08-18 DIAGNOSIS — M50222 Other cervical disc displacement at C5-C6 level: Secondary | ICD-10-CM | POA: Insufficient documentation

## 2015-08-18 DIAGNOSIS — M5033 Other cervical disc degeneration, cervicothoracic region: Secondary | ICD-10-CM | POA: Insufficient documentation

## 2015-08-18 DIAGNOSIS — M4802 Spinal stenosis, cervical region: Secondary | ICD-10-CM | POA: Insufficient documentation

## 2015-10-12 ENCOUNTER — Encounter: Payer: Self-pay | Admitting: Emergency Medicine

## 2015-10-12 ENCOUNTER — Emergency Department
Admission: EM | Admit: 2015-10-12 | Discharge: 2015-10-12 | Disposition: A | Discharge status: Home / Self Care | Payer: Medicare Other | Attending: Emergency Medicine | Admitting: Emergency Medicine

## 2015-10-12 DIAGNOSIS — Z880 Allergy status to penicillin: Secondary | ICD-10-CM | POA: Diagnosis not present

## 2015-10-12 DIAGNOSIS — Y93F2 Activity, caregiving, lifting: Secondary | ICD-10-CM | POA: Insufficient documentation

## 2015-10-12 DIAGNOSIS — Y9289 Other specified places as the place of occurrence of the external cause: Secondary | ICD-10-CM | POA: Insufficient documentation

## 2015-10-12 DIAGNOSIS — M5440 Lumbago with sciatica, unspecified side: Secondary | ICD-10-CM

## 2015-10-12 DIAGNOSIS — X501XXA Overexertion from prolonged static or awkward postures, initial encounter: Secondary | ICD-10-CM | POA: Insufficient documentation

## 2015-10-12 DIAGNOSIS — Y998 Other external cause status: Secondary | ICD-10-CM | POA: Insufficient documentation

## 2015-10-12 DIAGNOSIS — I10 Essential (primary) hypertension: Secondary | ICD-10-CM | POA: Insufficient documentation

## 2015-10-12 DIAGNOSIS — S4991XA Unspecified injury of right shoulder and upper arm, initial encounter: Secondary | ICD-10-CM | POA: Diagnosis present

## 2015-10-12 DIAGNOSIS — S46911A Strain of unspecified muscle, fascia and tendon at shoulder and upper arm level, right arm, initial encounter: Secondary | ICD-10-CM | POA: Insufficient documentation

## 2015-10-12 DIAGNOSIS — Z87891 Personal history of nicotine dependence: Secondary | ICD-10-CM | POA: Insufficient documentation

## 2015-10-12 DIAGNOSIS — E119 Type 2 diabetes mellitus without complications: Secondary | ICD-10-CM | POA: Insufficient documentation

## 2015-10-12 MED ORDER — DIAZEPAM 5 MG/ML IJ SOLN
10.0000 mg | Freq: Once | INTRAMUSCULAR | Status: AC
  Administered 2015-10-12: 10 mg via INTRAMUSCULAR
  Filled 2015-10-12: qty 2

## 2015-10-12 MED ORDER — KETOROLAC TROMETHAMINE 60 MG/2ML IM SOLN
60.0000 mg | Freq: Once | INTRAMUSCULAR | Status: AC
  Administered 2015-10-12: 60 mg via INTRAMUSCULAR
  Filled 2015-10-12: qty 2

## 2015-10-12 MED ORDER — BACLOFEN 10 MG PO TABS: 10.0000 mg | ORAL_TABLET | Freq: Three times a day (TID) | ORAL | Status: DC

## 2015-10-12 MED ORDER — MELOXICAM 15 MG PO TABS: 15.0000 mg | ORAL_TABLET | Freq: Every day | ORAL | Status: DC

## 2015-10-12 MED ORDER — HYDROCODONE-ACETAMINOPHEN 5-325 MG PO TABS: 1.0000 | ORAL_TABLET | ORAL | Status: DC | PRN

## 2015-10-12 NOTE — Discharge Instructions (Signed)
Chronic Back Pain ° When back pain lasts longer than 3 months, it is called chronic back pain. People with chronic back pain often go through certain periods that are more intense (flare-ups).  °CAUSES °Chronic back pain can be caused by wear and tear (degeneration) on different structures in your back. These structures include: °· The bones of your spine (vertebrae) and the joints surrounding your spinal cord and nerve roots (facets). °· The strong, fibrous tissues that connect your vertebrae (ligaments). °Degeneration of these structures may result in pressure on your nerves. This can lead to constant pain. °HOME CARE INSTRUCTIONS °· Avoid bending, heavy lifting, prolonged sitting, and activities which make the problem worse. °· Take brief periods of rest throughout the day to reduce your pain. Lying down or standing usually is better than sitting while you are resting. °· Take over-the-counter or prescription medicines only as directed by your caregiver. °SEEK IMMEDIATE MEDICAL CARE IF:  °· You have weakness or numbness in one of your legs or feet. °· You have trouble controlling your bladder or bowels. °· You have nausea, vomiting, abdominal pain, shortness of breath, or fainting. °  °This information is not intended to replace advice given to you by your health care provider. Make sure you discuss any questions you have with your health care provider. °  °Document Released: 12/07/2004 Document Revised: 01/22/2012 Document Reviewed: 04/19/2015 °Elsevier Interactive Patient Education ©2016 [employer] ° °Sciatica °Sciatica is pain, weakness, numbness, or tingling along your sciatic nerve. The nerve starts in the lower back and runs down the back of each leg. Nerve damage or certain conditions pinch or put pressure on the sciatic nerve. This causes the pain, weakness, and other discomforts of sciatica. °HOME CARE  °· Only take medicine as told by your doctor. °· Apply ice to the affected area for 20 minutes. Do  this 3-4 times a day for the first 48-72 hours. Then try heat in the same way. °· Exercise, stretch, or do your usual activities if these do not make your pain worse. °· Go to physical therapy as told by your doctor. °· Keep all doctor visits as told. °· Do not wear high heels or shoes that are not supportive. °· Get a firm mattress if your mattress is too soft to lessen pain and discomfort. °GET HELP RIGHT AWAY IF:  °· You cannot control when you poop (bowel movement) or pee (urinate). °· You have more weakness in your lower back, lower belly (pelvis), butt (buttocks), or legs. °· You have redness or puffiness (swelling) of your back. °· You have a burning feeling when you pee. °· You have pain that gets worse when you lie down. °· You have pain that wakes you from your sleep. °· Your pain is worse than past pain. °· Your pain lasts longer than 4 weeks. °· You are suddenly losing weight without reason. °MAKE SURE YOU:  °· Understand these instructions. °· Will watch this condition. °· Will get help right away if you are not doing well or get worse. °  °This information is not intended to replace advice given to you by your health care provider. Make sure you discuss any questions you have with your health care provider. °  °Document Released: 08/08/2008 Document Revised: 07/21/2015 Document Reviewed: 03/10/2012 °Elsevier Interactive Patient Education ©2016 Elsevier Inc. ° °

## 2015-10-12 NOTE — ED Provider Notes (Signed)
[facility] Emergency Department Provider Note  ____________________________________________  Time seen: Approximately 2:11 PM  I have reviewed the triage vital signs and the nursing notes.   HISTORY  Chief Complaint Back Pain    HPI Kristine Rangel is a 59 y.o. female presents for lower back pain, right shoulder pain. Patient reports past medical history significant for bulging disks. Denies any trauma other than lifting all over the Thanksgiving weekend and putting up Christmas decorations.   Past Medical History  Diagnosis Date  . MI (myocardial infarction) (HCC)   . Hypertension   . Hyperlipidemia   . Diabetes mellitus without complication (HCC)   . GERD (gastroesophageal reflux disease)   . Stroke (HCC)     TIA's  . Asthma     There are no active problems to display for this patient.   Past Surgical History  Procedure Laterality Date  . Brain surgery      anuerysm repair  . Joint replacement      bilateral knees    Current Outpatient Rx  Name  Route  Sig  Dispense  Refill  . baclofen (LIORESAL) 10 MG tablet   Oral   Take 1 tablet (10 mg total) by mouth 3 (three) times daily.   30 tablet   0   . HYDROcodone-acetaminophen (NORCO) 5-325 MG tablet   Oral   Take 1-2 tablets by mouth every 4 (four) hours as needed for moderate pain.   15 tablet   0   . meloxicam (MOBIC) 15 MG tablet   Oral   Take 1 tablet (15 mg total) by mouth daily.   30 tablet   0     Allergies Penicillins  No family history on file.  Social History Social History  Substance Use Topics  . Smoking status: Former Games developermoker  . Smokeless tobacco: None  . Alcohol Use: No    Review of Systems Constitutional: No fever/chills Eyes: No visual changes. ENT: No sore throat. Cardiovascular: Denies chest pain. Respiratory: Denies shortness of breath. Gastrointestinal: No abdominal pain.  No nausea, no vomiting.  No diarrhea.  No constipation. Genitourinary:  Negative for dysuria. Musculoskeletal: Positive for bilateral lower back pain. Positive for right shoulder pain. Skin: Negative for rash. Neurological: Negative for headaches, focal weakness or numbness.  10-point ROS otherwise negative.  ____________________________________________   PHYSICAL EXAM:  VITAL SIGNS: ED Triage Vitals  Enc Vitals Group     BP 10/12/15 1301 126/57 mmHg     Pulse Rate 10/12/15 1301 76     Resp 10/12/15 1301 16     Temp 10/12/15 1301 97.9 F (36.6 C)     Temp src --      SpO2 10/12/15 1301 97 %     Weight 10/12/15 1301 275 lb (124.739 kg)     Height 10/12/15 1301 5\' 5"  (1.651 m)     Head Cir --      Peak Flow --      Pain Score 10/12/15 1300 10     Pain Loc --      Pain Edu? --      Excl. in GC? --     Constitutional: Alert and oriented. Well appearing and in mild distress. Patient is morbidly obese and has difficulty moving around and lying down. Eyes: Conjunctivae are normal. PERRL. EOMI. Head: Atraumatic. Nose: No congestion/rhinnorhea. Mouth/Throat: Mucous membranes are moist.  Oropharynx non-erythematous. Neck: No stridor. No cervical spinal tenderness to palpation.  Cardiovascular: Normal rate, regular rhythm. Grossly  normal heart sounds.  Good peripheral circulation. Respiratory: Normal respiratory effort.  No retractions. Lungs CTAB. Gastrointestinal: Soft and nontender. No distention. No abdominal bruits. No CVA tenderness. Musculoskeletal: Straight leg raise positive bilateral. Increased motion pain with abduction and abduction of the right shoulder. Neurologic:  Normal speech and language. No gross focal neurologic deficits are appreciated. No gait instability. Skin:  Skin is warm, dry and intact. No rash noted. Psychiatric: Mood and affect are normal. Speech and behavior are normal.  ____________________________________________   LABS (all labs ordered are listed, but only abnormal results are displayed)  Labs Reviewed - No data  to display ____________________________________________  RADIOLOGY  Deferred at this time. ____________________________________________   PROCEDURES  Procedure(s) performed: None  Critical Care performed: No  ____________________________________________   INITIAL IMPRESSION / ASSESSMENT AND PLAN / ED COURSE  Pertinent labs & imaging results that were available during my care of the patient were reviewed by me and considered in my medical decision making (see chart for details).  Acute shoulder and lumbar strain. Rx given for baclofen 10 mg daily, hydrocodone 5/325, and meloxicam 15 mg daily. Patient to follow-up with PCP or return to the ER with any worsening symptomology.  Patient voices no other emergency medical complaints at this time. ____________________________________________   FINAL CLINICAL IMPRESSION(S) / ED DIAGNOSES  Final diagnoses:  Low back pain with sciatica, sciatica laterality unspecified, unspecified back pain laterality  Shoulder strain, right, initial encounter      Evangeline Dakin, PA-C 10/12/15 1440  Sharman Cheek, MD 10/12/15 1601

## 2015-10-12 NOTE — ED Notes (Signed)
C/o lower back pain radiating to legs, right greater than left, and right arm.  Onset of symptoms Thanksgiving.  Patient has had similar episodes of back pain due to "buldging discs"

## 2015-12-08 ENCOUNTER — Other Ambulatory Visit: Payer: Self-pay | Admitting: Family Medicine

## 2015-12-08 DIAGNOSIS — Z0000 Encounter for general adult medical examination without abnormal findings: Secondary | ICD-10-CM

## 2016-01-03 ENCOUNTER — Ambulatory Visit
Admission: RE | Admit: 2016-01-03 | Discharge: 2016-01-03 | Disposition: A | Discharge status: Home / Self Care | Payer: Medicare Other | Source: Ambulatory Visit | Attending: Family Medicine | Admitting: Family Medicine

## 2016-01-03 DIAGNOSIS — Z1231 Encounter for screening mammogram for malignant neoplasm of breast: Secondary | ICD-10-CM | POA: Insufficient documentation

## 2016-01-03 DIAGNOSIS — Z0000 Encounter for general adult medical examination without abnormal findings: Secondary | ICD-10-CM

## 2016-04-08 ENCOUNTER — Emergency Department
Admission: EM | Admit: 2016-04-08 | Discharge: 2016-04-08 | Disposition: A | Discharge status: Home / Self Care | Payer: Medicare Other | Attending: Emergency Medicine | Admitting: Emergency Medicine

## 2016-04-08 ENCOUNTER — Emergency Department: Payer: Medicare Other

## 2016-04-08 ENCOUNTER — Encounter: Payer: Self-pay | Admitting: Emergency Medicine

## 2016-04-08 DIAGNOSIS — Z87891 Personal history of nicotine dependence: Secondary | ICD-10-CM | POA: Diagnosis not present

## 2016-04-08 DIAGNOSIS — I252 Old myocardial infarction: Secondary | ICD-10-CM | POA: Insufficient documentation

## 2016-04-08 DIAGNOSIS — Z8673 Personal history of transient ischemic attack (TIA), and cerebral infarction without residual deficits: Secondary | ICD-10-CM | POA: Diagnosis not present

## 2016-04-08 DIAGNOSIS — J45909 Unspecified asthma, uncomplicated: Secondary | ICD-10-CM | POA: Diagnosis not present

## 2016-04-08 DIAGNOSIS — Z7982 Long term (current) use of aspirin: Secondary | ICD-10-CM | POA: Insufficient documentation

## 2016-04-08 DIAGNOSIS — R200 Anesthesia of skin: Secondary | ICD-10-CM | POA: Insufficient documentation

## 2016-04-08 DIAGNOSIS — R531 Weakness: Secondary | ICD-10-CM

## 2016-04-08 DIAGNOSIS — Z79899 Other long term (current) drug therapy: Secondary | ICD-10-CM | POA: Insufficient documentation

## 2016-04-08 DIAGNOSIS — I10 Essential (primary) hypertension: Secondary | ICD-10-CM | POA: Insufficient documentation

## 2016-04-08 DIAGNOSIS — E785 Hyperlipidemia, unspecified: Secondary | ICD-10-CM | POA: Insufficient documentation

## 2016-04-08 DIAGNOSIS — E119 Type 2 diabetes mellitus without complications: Secondary | ICD-10-CM | POA: Diagnosis not present

## 2016-04-08 LAB — COMPREHENSIVE METABOLIC PANEL
ALT: 13 U/L — ABNORMAL LOW (ref 14–54)
AST: 18 U/L (ref 15–41)
Albumin: 3.5 g/dL (ref 3.5–5.0)
Alkaline Phosphatase: 91 U/L (ref 38–126)
Anion gap: 3 — ABNORMAL LOW (ref 5–15)
BUN: 15 mg/dL (ref 6–20)
CHLORIDE: 117 mmol/L — AB (ref 101–111)
CO2: 21 mmol/L — ABNORMAL LOW (ref 22–32)
Calcium: 9.4 mg/dL (ref 8.9–10.3)
Creatinine, Ser: 1.03 mg/dL — ABNORMAL HIGH (ref 0.44–1.00)
GFR, EST NON AFRICAN AMERICAN: 57 mL/min — AB (ref >60)
Glucose, Bld: 91 mg/dL (ref 65–99)
POTASSIUM: 5 mmol/L (ref 3.5–5.1)
Sodium: 141 mmol/L (ref 135–145)
Total Bilirubin: 0.8 mg/dL (ref 0.3–1.2)
Total Protein: 6.3 g/dL — ABNORMAL LOW (ref 6.5–8.1)

## 2016-04-08 LAB — CBC
HEMATOCRIT: 38.1 % (ref 35.0–47.0)
Hemoglobin: 12.2 g/dL (ref 12.0–16.0)
MCH: 27.3 pg (ref 26.0–34.0)
MCHC: 31.9 g/dL — ABNORMAL LOW (ref 32.0–36.0)
MCV: 85.6 fL (ref 80.0–100.0)
Platelets: 224 10*3/uL (ref 150–440)
RBC: 4.45 MIL/uL (ref 3.80–5.20)
RDW: 15.3 % — ABNORMAL HIGH (ref 11.5–14.5)
WBC: 6 10*3/uL (ref 3.6–11.0)

## 2016-04-08 LAB — URINALYSIS COMPLETE WITH MICROSCOPIC (ARMC ONLY)
Bilirubin Urine: NEGATIVE
Glucose, UA: NEGATIVE mg/dL
HGB URINE DIPSTICK: NEGATIVE
Ketones, ur: NEGATIVE mg/dL
LEUKOCYTES UA: NEGATIVE
NITRITE: NEGATIVE
PROTEIN: NEGATIVE mg/dL
SPECIFIC GRAVITY, URINE: 1.019 (ref 1.005–1.030)
pH: 7 (ref 5.0–8.0)

## 2016-04-08 LAB — TROPONIN I: Troponin I: <0.03 ng/mL (ref <0.031)

## 2016-04-08 NOTE — ED Provider Notes (Signed)
[facility] Emergency Department Provider Note  Time seen: 1:26 PM  I have reviewed the triage vital signs and the nursing notes.   HISTORY  Chief Complaint Numbness    HPI Kristine Rangel is a 60 y.o. female with a past medical history of MI, hypertension, hyperlipidemia, CVA with right-sided deficits, asthma, presents the emergency department with right-sided numbness and weakness. According to the patient around 2:00 this morning she awoke feeling that her hands were tight and numb bilaterally. She states later on this morning she feels that her symptoms have progressed more to the right side. She states a history of CVA in the past with mild right-sided deficits. The patient uses a cane at baseline to ambulate. Patient feels like her typical weakness and numbness of the right side was worsened, and then she developed a headache so she came to the emergency department for evaluation. States very mild headache at this time. Patient also has a history of a brain aneurysm status post coiling, but denies any history of bleed.Patient states she is feeling somewhat better at this time, but continues to feel more weak on the right than the left. She does admit this is somewhat baseline, but somewhat worse than baseline.     Past Medical History  Diagnosis Date  . MI (myocardial infarction) (HCC)   . Hypertension   . Hyperlipidemia   . Diabetes mellitus without complication (HCC)   . GERD (gastroesophageal reflux disease)   . Stroke (HCC)     TIA's  . Asthma     There are no active problems to display for this patient.   Past Surgical History  Procedure Laterality Date  . Brain surgery      anuerysm repair  . Joint replacement      bilateral knees    Current Outpatient Rx  Name  Route  Sig  Dispense  Refill  . baclofen (LIORESAL) 10 MG tablet   Oral   Take 1 tablet (10 mg total) by mouth 3 (three) times daily.   30 tablet   0   .  HYDROcodone-acetaminophen (NORCO) 5-325 MG tablet   Oral   Take 1-2 tablets by mouth every 4 (four) hours as needed for moderate pain.   15 tablet   0   . meloxicam (MOBIC) 15 MG tablet   Oral   Take 1 tablet (15 mg total) by mouth daily.   30 tablet   0     Allergies Penicillins  Family History  Problem Relation Age of Onset  . Breast cancer Maternal Aunt     Social History Social History  Substance Use Topics  . Smoking status: Former Games developer  . Smokeless tobacco: None  . Alcohol Use: No    Review of Systems Constitutional: Negative for fever. Cardiovascular: Negative for chest pain. Respiratory: Negative for shortness of breath. Gastrointestinal: Negative for abdominal pain, vomiting and diarrhea. Musculoskeletal: Negative for back pain. Neurological: Mild headache. Increased right-sided weakness/numbness compared to baseline. 10-point ROS otherwise negative.  ____________________________________________   PHYSICAL EXAM:  VITAL SIGNS: ED Triage Vitals  Enc Vitals Group     BP 04/08/16 1250 116/54 mmHg     Pulse Rate 04/08/16 1250 71     Resp 04/08/16 1250 18     Temp 04/08/16 1250 97.9 F (36.6 C)     Temp Source 04/08/16 1250 Oral     SpO2 04/08/16 1250 100 %     Weight 04/08/16 1250 275 lb (124.739 kg)  Height 04/08/16 1250 5\' 5"  (1.651 m)     Head Cir --      Peak Flow --      Pain Score 04/08/16 1250 10     Pain Loc --      Pain Edu? --      Excl. in GC? --     Constitutional: Alert and oriented. Well appearing and in no distress. Eyes: Normal exam ENT   Head: Normocephalic and atraumatic.   Mouth/Throat: Mucous membranes are moist. Cardiovascular: Normal rate, regular rhythm. No murmurs, rubs, or gallops. Respiratory: Normal respiratory effort without tachypnea nor retractions. Breath sounds are clearGastrointestinal: Soft and nontender. No distention.   Musculoskeletal: Nontender with normal range of motion in all extremities.   Neurologic:  Normal speech and language. No cranial nerve deficits. Equal grip strengths bilaterally. No pronator drift. Patient does states subjective decreased sensation which she states is mild in the right upper extremity and right lower extremity. 5/5 motor in all extremities. Skin:  Skin is warm, dry and intact.  Psychiatric: Mood and affect are normal. Speech is normal.  ____________________________________________    EKG  EKG reviewed and interpreted by myself shows normal sinus rhythm at 70 bpm, narrow QRS, normal axis, normal intervals, no ST changes. Normal EKG.  ____________________________________________    RADIOLOGY  CT head shows no acute change.   INITIAL IMPRESSION / ASSESSMENT AND PLAN / ED COURSE  Pertinent labs & imaging results that were available during my care of the patient were reviewed by me and considered in my medical decision making (see chart for details).  Patient presents the emergency department with bilateral hand numbness and tightness, which has now progressed more to the right side per patient. Denies any tightness or numbness in the left hand currently. Does state a mild headache with increased right-sided deficits compared to her baseline.  We will check a CT head, labs, and closely monitor in the emergency department.  Patient's labs are largely within normal limits. Patient continues to state she feels like the right arm and leg are more numb than normal. Given the increased deficit we will attempt to obtain an MRI to rule out new CVA. Patient appears very well on exam, no distress, lying in bed watching TV comfortably.   Patient care signed out to oncoming physician.  ____________________________________________   FINAL CLINICAL IMPRESSION(S) / ED DIAGNOSES  Right-sided numbness/weakness   Minna AntisKevin Mkenzie Dotts, MD 04/08/16 1529

## 2016-04-08 NOTE — ED Notes (Signed)
Patient transported to MRI 

## 2016-04-08 NOTE — ED Notes (Signed)
States awoke approx 2 or 3 in am and had bilateral hand numbness. Through day has continued and then developed gradually R face and arm and leg numbness. Slight R and leg weakness but she states this is residual from previous stroke.

## 2016-04-08 NOTE — Discharge Instructions (Signed)
Weakness °Weakness is a lack of strength. You may feel weak all over your body or just in one part of your body. Weakness can be serious. In some cases, you may need more medical tests. °HOME CARE °· Rest. °· Eat a well-balanced diet. °· Try to exercise every day. °· Only take medicines as told by your doctor. °GET HELP RIGHT AWAY IF:  °· You cannot do your normal daily activities. °· You cannot walk up and down stairs, or you feel very tired when you do so. °· You have shortness of breath or chest pain. °· You have trouble moving parts of your body. °· You have weakness in only one body part or on only one side of the body. °· You have a fever. °· You have trouble speaking or swallowing. °· You cannot control when you pee (urinate) or poop (bowel movement). °· You have black or bloody throw up (vomit) or poop. °· Your weakness gets worse or spreads to other body parts. °· You have new aches or pains. °MAKE SURE YOU:  °· Understand these instructions. °· Will watch your condition. °· Will get help right away if you are not doing well or get worse. °  °This information is not intended to replace advice given to you by your health care provider. Make sure you discuss any questions you have with your health care provider. °  °Document Released: 10/12/2008 Document Revised: 04/30/2012 Document Reviewed: 12/29/2011 °Elsevier Interactive Patient Education ©2016 [employer]vier [employer] ° °

## 2016-04-08 NOTE — ED Provider Notes (Signed)
Ms. ENT is a 60 year old female with a history of CVA who is presenting to the emergency department today with right-sided numbness and weakness. She is pending an MRI at this time. The sign out from Dr. Lenard LancePaduchowski is to follow-up with MRI. Physical Exam  BP 130/77 mmHg  Pulse 62  Temp(Src) 97.9 F (36.6 C) (Oral)  Resp 13  Ht 5\' 5"  (1.651 m)  Wt 275 lb (124.739 kg)  BMI 45.76 kg/m2  SpO2 100% ----------------------------------------- 4:33 PM on 04/08/2016 -----------------------------------------   Physical Exam Patient resting comfortably this time. 5 out of 5 motor strength in all extremities at this time. Sensation is intact to light touch throughout and the patient says that she feels that she is back to her baseline. ED Course  Procedures   MR Brain Wo Contrast (Final result) Result time: 04/08/16 15:55:45    Final result by Rad Results In Interface (04/08/16 15:55:45)    Narrative:   CLINICAL DATA: Increased right-sided weakness and numbness  EXAM: MRI HEAD WITHOUT CONTRAST  TECHNIQUE: Multiplanar, multiecho pulse sequences of the brain and surrounding structures were obtained without intravenous contrast.  COMPARISON: CT head 04/08/2016  FINDINGS: Ventricle size is normal. Cerebral volume is normal.  Negative for acute infarction. Small hyperintensity right frontal white matter likely due to chronic ischemia. Brainstem appears normal.  Embolization coils in the mid basilar artery causing artifact.  Negative for intracranial hemorrhage. Negative for mass or edema.  Paranasal sinuses clear. Normal orbit bilaterally.  Pituitary and skull base normal.  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   Electronically Signed By: Marlan Palauharles Clark M.D. On: 04/08/2016 15:55         MDM No acute findings on the MRI. The patient was made aware of her testing results. She'll be  discharged home to follow-up with her primary care doctor and I'll also be giving her the number for neurology. She says she does not have an outpatient neurologist follow with it this time. She understands the plan for discharge and is willing to comply.     Myrna Blazeravid Matthew Tarrin Menn, MD 04/08/16 [phone]

## 2016-06-20 ENCOUNTER — Other Ambulatory Visit: Payer: Self-pay | Admitting: Orthopedic Surgery

## 2016-06-20 DIAGNOSIS — M25512 Pain in left shoulder: Secondary | ICD-10-CM

## 2016-07-01 ENCOUNTER — Ambulatory Visit
Admission: RE | Admit: 2016-07-01 | Discharge: 2016-07-01 | Disposition: A | Discharge status: Home / Self Care | Payer: Medicare Other | Source: Ambulatory Visit | Attending: Orthopedic Surgery | Admitting: Orthopedic Surgery

## 2016-07-01 DIAGNOSIS — M7552 Bursitis of left shoulder: Secondary | ICD-10-CM | POA: Insufficient documentation

## 2016-07-01 DIAGNOSIS — S46022A Laceration of muscle(s) and tendon(s) of the rotator cuff of left shoulder, initial encounter: Secondary | ICD-10-CM | POA: Insufficient documentation

## 2016-07-01 DIAGNOSIS — M25512 Pain in left shoulder: Secondary | ICD-10-CM | POA: Insufficient documentation

## 2016-07-01 DIAGNOSIS — X58XXXA Exposure to other specified factors, initial encounter: Secondary | ICD-10-CM | POA: Diagnosis not present

## 2016-10-14 ENCOUNTER — Encounter: Payer: Self-pay | Admitting: Emergency Medicine

## 2016-10-14 ENCOUNTER — Emergency Department
Admission: EM | Admit: 2016-10-14 | Discharge: 2016-10-14 | Disposition: A | Discharge status: Home / Self Care | Payer: Medicare Other | Attending: Emergency Medicine | Admitting: Emergency Medicine

## 2016-10-14 DIAGNOSIS — I252 Old myocardial infarction: Secondary | ICD-10-CM | POA: Insufficient documentation

## 2016-10-14 DIAGNOSIS — E119 Type 2 diabetes mellitus without complications: Secondary | ICD-10-CM | POA: Diagnosis not present

## 2016-10-14 DIAGNOSIS — W25XXXA Contact with sharp glass, initial encounter: Secondary | ICD-10-CM | POA: Insufficient documentation

## 2016-10-14 DIAGNOSIS — Z7982 Long term (current) use of aspirin: Secondary | ICD-10-CM | POA: Insufficient documentation

## 2016-10-14 DIAGNOSIS — J45909 Unspecified asthma, uncomplicated: Secondary | ICD-10-CM | POA: Diagnosis not present

## 2016-10-14 DIAGNOSIS — Y929 Unspecified place or not applicable: Secondary | ICD-10-CM | POA: Diagnosis not present

## 2016-10-14 DIAGNOSIS — S90852A Superficial foreign body, left foot, initial encounter: Secondary | ICD-10-CM

## 2016-10-14 DIAGNOSIS — Z87891 Personal history of nicotine dependence: Secondary | ICD-10-CM | POA: Diagnosis not present

## 2016-10-14 DIAGNOSIS — Y939 Activity, unspecified: Secondary | ICD-10-CM | POA: Insufficient documentation

## 2016-10-14 DIAGNOSIS — I10 Essential (primary) hypertension: Secondary | ICD-10-CM | POA: Diagnosis not present

## 2016-10-14 DIAGNOSIS — Y999 Unspecified external cause status: Secondary | ICD-10-CM | POA: Insufficient documentation

## 2016-10-14 DIAGNOSIS — Z79899 Other long term (current) drug therapy: Secondary | ICD-10-CM | POA: Diagnosis not present

## 2016-10-14 NOTE — Discharge Instructions (Signed)
Please monitor left foot for any redness, warmth, swelling, pain. If any redness, warmth, swelling, pain return to the ER.

## 2016-10-14 NOTE — ED Notes (Signed)
See triage note. Pt states she stepped on glass yesterday after breaking a pot lid. States she removed several small shards with tweezers yesterday but feels like there is a piece she missed in her L heel. States she can feel it when she puts weight on foot.

## 2016-10-14 NOTE — ED Triage Notes (Signed)
Pt stepped on glass yesterday and feels like still has glass in left foot. Denies bleeding.

## 2016-10-14 NOTE — ED Provider Notes (Signed)
[facility]-EMERGENCY DEPARTMENT Provider Note   CSN: [id number] Arrival date & time: 10/14/16  1519     History   Chief Complaint Chief Complaint  Patient presents with  . glass in foot    HPI Kristine Rangel is a 60 y.o. female presents to the emergency department for evaluation of foreign body sensation into her left heel. Patient states yesterday she believes she stepped on a piece of glass. It is felt a sharp pain in the left heel with every step. Patient's pain is moderate and only with weightbearing. She denies any redness or swelling warmth or drainage. She is tried to remove this on her own and is also had her son try to remove it with no success. She is borderline diabetic, tetanus is up-to-date. She is currently ambulating with no assisted devices.  HPI  Past Medical History:  Diagnosis Date  . Asthma   . Diabetes mellitus without complication (HCC)   . GERD (gastroesophageal reflux disease)   . Hyperlipidemia   . Hypertension   . MI (myocardial infarction)   . Stroke [facility])    TIA's    There are no active problems to display for this patient.   Past Surgical History:  Procedure Laterality Date  . BRAIN SURGERY     anuerysm repair  . JOINT REPLACEMENT     bilateral knees    OB History    No data available       Home Medications    Prior to Admission medications   Medication Sig Start Date End Date Taking? Authorizing Provider  acetaminophen (TYLENOL) 325 MG tablet Take 650 mg by mouth every 6 (six) hours as needed for mild pain or moderate pain.    Historical Provider, MD  albuterol (PROVENTIL) (2.5 MG/3ML) 0.083% nebulizer solution Take 2.5 mg by nebulization every 6 (six) hours as needed. For wheezing. 03/16/16   Historical Provider, MD  aspirin 81 MG chewable tablet Chew 81 mg by mouth at bedtime.    Historical Provider, MD  atorvastatin (LIPITOR) 40 MG tablet Take 40 mg by mouth at bedtime. 03/13/16   Historical Provider, MD  baclofen (LIORESAL) 10 MG  tablet Take 1 tablet (10 mg total) by mouth 3 (three) times daily. 10/12/15   Charmayne Sheer Beers, PA-C  brimonidine-timolol (COMBIGAN) 0.2-0.5 % ophthalmic solution Place 1 drop into both eyes 2 (two) times daily.    Historical Provider, MD  brinzolamide (AZOPT) 1 % ophthalmic suspension Place 1 drop into both eyes at bedtime.     Historical Provider, MD  cetirizine (ZYRTEC) 10 MG tablet Take 10 mg by mouth daily.    Historical Provider, MD  DULoxetine (CYMBALTA) 60 MG capsule Take 60 mg by mouth daily. 03/13/16   Historical Provider, MD  esomeprazole (NEXIUM) 40 MG capsule Take 40 mg by mouth daily. 03/17/16   Historical Provider, MD  fluticasone (FLONASE) 50 MCG/ACT nasal spray Place 1 spray into both nostrils daily as needed for allergies or rhinitis.  03/16/16   Historical Provider, MD  furosemide (LASIX) 40 MG tablet Take 40 mg by mouth 2 (two) times daily.    Historical Provider, MD  HYDROcodone-acetaminophen (NORCO) 5-325 MG tablet Take 1-2 tablets by mouth every 4 (four) hours as needed for moderate pain. 10/12/15   Charmayne Sheer Beers, PA-C  meloxicam (MOBIC) 15 MG tablet Take 1 tablet (15 mg total) by mouth daily. 10/12/15   Charmayne Sheer Beers, PA-C  potassium chloride (K-DUR) 10 MEQ tablet Take 10 mEq by mouth daily. 03/13/16  Historical Provider, MD  SYMBICORT 160-4.5 MCG/ACT inhaler Inhale 2 puffs into the lungs 2 (two) times daily. 03/13/16   Historical Provider, MD  tiZANidine (ZANAFLEX) 2 MG tablet Take 2 mg by mouth See admin instructions. Take 1 tablet by mouth 3 to 4 times a day as needed for muscle spasms.    Historical Provider, MD  topiramate (TOPAMAX) 100 MG tablet Take 100 mg by mouth at bedtime.  03/13/16   Historical Provider, MD  VESICARE 10 MG tablet Take 10 mg by mouth daily. 03/13/16   Historical Provider, MD    Family History Family History  Problem Relation Age of Onset  . Breast cancer Maternal Aunt     Social History Social History  Substance Use Topics  . Smoking status: Former  Games developermoker  . Smokeless tobacco: Not on file  . Alcohol use No     Allergies   Penicillins   Review of Systems Review of Systems  Constitutional: Negative for activity change, chills, fatigue and fever.  Gastrointestinal: Negative for abdominal pain, diarrhea, nausea and vomiting.  Musculoskeletal: Positive for arthralgias (left heel). Negative for gait problem.  Skin: Negative for rash and wound.  Neurological: Negative for weakness and numbness.  Hematological: Negative for adenopathy.  Psychiatric/Behavioral: Negative for agitation, behavioral problems and confusion.     Physical Exam Updated Vital Signs BP 130/79 (BP Location: Left Arm)   Pulse 80   Temp 98.2 F (36.8 C) (Oral)   Resp 18   Ht 5\' 5"  (1.651 m)   Wt 116.1 kg   SpO2 96%   BMI 42.60 kg/m   Physical Exam  Constitutional: She appears well-developed and well-nourished. No distress.  HENT:  Head: Normocephalic and atraumatic.  Eyes: Conjunctivae and EOM are normal.  Neck: Normal range of motion. Neck supple.  Cardiovascular: Normal rate and regular rhythm.   No murmur heard. Pulmonary/Chest: Effort normal and breath sounds normal. No respiratory distress.  Abdominal: Soft. There is no tenderness.  Musculoskeletal:  Examination of the left foot shows patient has no swelling warmth or erythema. Patient has a tender area to the heel with no signs of infection and no visible or palpable foreign body. Area of tenderness is scraped with an 11 blade, glass is felt against the blade in this area is opened up. Along the callus of her heel there is a small piece of glass that is removed. There is no active bleeding, no redness warmth or drainage. Patient is able to ambulate with no pain and states her symptoms have resolved.  Skin: Skin is warm and dry.  Psychiatric: She has a normal mood and affect. Her behavior is normal. Thought content normal.  Nursing note and vitals reviewed.    ED Treatments / Results   Labs (all labs ordered are listed, but only abnormal results are displayed) Labs Reviewed - No data to display  EKG  EKG Interpretation None       Radiology No results found.  Procedures Procedures (including critical care time) Foreign body removal. Foreign body removed with an 11 blade. Piece of glass removed from the callus of the left heel.  Medications Ordered in ED Medications - No data to display   Initial Impression / Assessment and Plan / ED Course  I have reviewed the triage vital signs and the nursing notes.  Pertinent labs & imaging results that were available during my care of the patient were reviewed by me and considered in my medical decision making (see chart for details).  Clinical Course     60 year old female with foreign body sensation into the left heel. A small piece of glass was removed using an 11 blade. Glass was superficial within the callus of her heel. No signs of infection and no bleeding noted. Patient's symptoms resolved. She is educated on signs and symptoms to return to the emergency department for.  Final Clinical Impressions(s) / ED Diagnoses   Final diagnoses:  Foreign body in left foot, initial encounter    New Prescriptions New Prescriptions   No medications on file     Evon Slackhomas C Korion Cuevas, PA-C 10/14/16 1618    Evon Slackhomas C Nicholas Trompeter, PA-C 10/14/16 1619    Sharman CheekPhillip Stafford, MD 10/14/16 2118

## 2016-10-25 ENCOUNTER — Other Ambulatory Visit: Payer: Self-pay | Admitting: Orthopedic Surgery

## 2016-11-14 ENCOUNTER — Inpatient Hospital Stay: Admission: RE | Admit: 2016-11-14 | Payer: Medicare Other | Source: Ambulatory Visit

## 2016-11-16 ENCOUNTER — Encounter
Admission: RE | Admit: 2016-11-16 | Discharge: 2016-11-16 | Disposition: A | Discharge status: Home / Self Care | Payer: Medicare Other | Source: Ambulatory Visit | Attending: Orthopedic Surgery | Admitting: Orthopedic Surgery

## 2016-11-16 DIAGNOSIS — M25512 Pain in left shoulder: Secondary | ICD-10-CM | POA: Insufficient documentation

## 2016-11-16 DIAGNOSIS — Z01812 Encounter for preprocedural laboratory examination: Secondary | ICD-10-CM | POA: Diagnosis not present

## 2016-11-16 HISTORY — DX: Polyneuropathy, unspecified: G62.9

## 2016-11-16 HISTORY — DX: Headache: R51

## 2016-11-16 HISTORY — DX: Cerebral aneurysm, nonruptured: I67.1

## 2016-11-16 HISTORY — DX: Unspecified osteoarthritis, unspecified site: M19.90

## 2016-11-16 HISTORY — DX: Heart failure, unspecified: I50.9

## 2016-11-16 HISTORY — DX: Unspecified injury of muscle(s) and tendon(s) of the rotator cuff of unspecified shoulder, initial encounter: S46.009A

## 2016-11-16 HISTORY — DX: Unspecified glaucoma: H40.9

## 2016-11-16 HISTORY — DX: Other seasonal allergic rhinitis: J30.2

## 2016-11-16 HISTORY — DX: Unspecified urinary incontinence: R32

## 2016-11-16 HISTORY — DX: Dyspnea, unspecified: R06.00

## 2016-11-16 HISTORY — DX: Headache, unspecified: R51.9

## 2016-11-16 HISTORY — DX: Chronic kidney disease, unspecified: N18.9

## 2016-11-16 LAB — APTT: APTT: 34 s (ref 24–36)

## 2016-11-16 LAB — CBC WITH DIFFERENTIAL/PLATELET
BASOS ABS: 0 10*3/uL (ref 0–0.1)
Basophils Relative: 1 %
EOS ABS: 0.3 10*3/uL (ref 0–0.7)
EOS PCT: 8 %
HCT: 41.1 % (ref 35.0–47.0)
Hemoglobin: 13.4 g/dL (ref 12.0–16.0)
LYMPHS ABS: 1.4 10*3/uL (ref 1.0–3.6)
Lymphocytes Relative: 37 %
MCH: 27.3 pg (ref 26.0–34.0)
MCHC: 32.5 g/dL (ref 32.0–36.0)
MCV: 84.1 fL (ref 80.0–100.0)
MONO ABS: 0.5 10*3/uL (ref 0.2–0.9)
Monocytes Relative: 12 %
Neutro Abs: 1.6 10*3/uL (ref 1.4–6.5)
Neutrophils Relative %: 42 %
PLATELETS: 238 10*3/uL (ref 150–440)
RBC: 4.89 MIL/uL (ref 3.80–5.20)
RDW: 15.7 % — AB (ref 11.5–14.5)
WBC: 3.8 10*3/uL (ref 3.6–11.0)

## 2016-11-16 LAB — BASIC METABOLIC PANEL
Anion gap: 5 (ref 5–15)
BUN: 16 mg/dL (ref 6–20)
CO2: 22 mmol/L (ref 22–32)
CREATININE: 0.95 mg/dL (ref 0.44–1.00)
Calcium: 10.1 mg/dL (ref 8.9–10.3)
Chloride: 111 mmol/L (ref 101–111)
GFR calc Af Amer: >60 mL/min (ref >60)
GLUCOSE: 89 mg/dL (ref 65–99)
Potassium: 3.6 mmol/L (ref 3.5–5.1)
Sodium: 138 mmol/L (ref 135–145)

## 2016-11-16 LAB — PROTIME-INR
INR: 1.01
PROTHROMBIN TIME: 13.3 s (ref 11.4–15.2)

## 2016-11-16 MED ORDER — CHLORHEXIDINE GLUCONATE CLOTH 2 % EX PADS
6.0000 | MEDICATED_PAD | Freq: Once | CUTANEOUS | Status: DC
  Filled 2016-11-16: qty 6

## 2016-11-16 NOTE — Pre-Procedure Instructions (Signed)
Component Name Value Ref Range  LV Ejection Fraction (%) 55   Aortic Valve Stenosis Grade none   Aortic Valve Regurgitation Grade none   Aortic Valve Max Velocity (m/s) 1.2 m/sec   Mitral Valve Stenosis Grade none   Mitral Valve Regurgitation Grade moderate   Tricuspid Valve Regurgitation Grade mild   Tricuspid Valve Regurgitation Max Velocity (m/s) 2.6 m/sec   Right Ventricle Systolic Pressure (mmHg) 37.2 mmHg   LV End Diastolic Diameter (cm) 3.9 cm  LV End Systolic Diameter (cm) 2.6 cm  LV Septum Wall Thickness (cm) 1.3 cm  LV Posterior Wall Thickness (cm) 1.0 cm  Left Atrium Diameter (cm) 4.5 cm  Result Narrative   CARDIOLOGY DEPARTMENT Leafy HalfYANCEY, Alysha [facility] [id number] [facility] Acct #: [id number] [address], [place], [place] [id number] Date: 06/02/2015 11:13 AM  Adult Female Age: [age] yrs  ECHOCARDIOGRAM REPORT Outpatient STUDY:CHEST WALL TAPE: KC::KCWC  ECHO:Yes DOPPLER:YesFILE: MD1: COLOR:YesCONTRAST:Yes MACHINE:Philips RV BIOPSY:No 3D:No SOUND QLTY:Moderate  MEDIUM:None ___________________________________________________________________________________________   HISTORY:Chest pain REASON:Assess, LV function INDICATION:R06.02 (ICD-10-CM) - SOB (shortness of breath) ___________________________________________________________________________________________  ECHOCARDIOGRAPHIC MEASUREMENTS 2D DIMENSIONS AORTA ValuesNormal RangeMAIN PAValuesNormal Range Annulus:1.8 cm[2.1 - 2.5]PA  Main:nm* [1.5 - 2.1] Aorta Sin:nm* [2.7 - 3.3] RIGHT VENTRICLE ST Junction:nm* [2.3 - 2.9]RV Base:nm* [ < 4.2] Asc.Aorta:nm* [2.3 - 3.1] RV Mid:nm* [ < 3.5]  LEFT VENTRICLERV Length:nm* [ < 8.6] LVIDd:3.9 cm[3.9 - 5.3] INFERIOR VENA CAVA LVIDs:2.6 cmMax. IVC:nm* [ <= 2.1]  FS:33.8 %[> 25]Min. IVC:nm* SWT:1.3 cm[0.5 - 0.9] ------------------ PWT:1.0 cm[0.5 - 0.9] nm* - not measured  LEFT ATRIUM LA Diam:4.5 cm[2.7 - 3.8] LA A4C Area:nm* [ < 20] LA Volume:nm* [22 - 52] ___________________________________________________________________________________________  ECHOCARDIOGRAPHIC DESCRIPTIONS  AORTIC ROOT Size:Normal Dissection:INDETERM FOR DISSECTION  AORTIC VALVE Leaflets:TricuspidMorphology:Normal Mobility:Fully mobile  LEFT VENTRICLE Size:Normal Anterior:Normal  Contraction:NormalLateral:Normal Closest EF:>55% (Estimated) Septal:Normal  LV Masses:No MassesApical:Normal  HYQ:MVHQLVH:MILD LVH Inferior:Normal  Posterior:Normal Dias.FxClass:N/A  MITRAL VALVE Leaflets:Normal Mobility:Fully mobile Morphology:Normal  LEFT ATRIUM Size:MODERATELY ENLARGED LA Masses:No masses  MAIN PA Size:Normal  PULMONIC VALVE Leaflets:N/AMorphology:Normal Mobility:Fully mobile  RIGHT VENTRICLE  RV Masses:No MassesSize:Normal  Free  Wall:NormalContraction:Normal  TRICUSPID VALVE Leaflets:Normal Mobility:Fully mobile Morphology:Normal  RIGHT ATRIUM Size:Normal RA Other:None  RA Mass:No masses  PERICARDIUM  Fluid:No effusion  INFERIOR VENACAVA Size:Not seen Not Seen  ____________________________________________________________________  DOPPLER ECHO and OTHER SPECIAL PROCEDURES  Aortic:No AR No AS 124.0 cm/sec peak vel 6.2 mmHg peak grad   Mitral:MODERATE MR No MS 3.3 cm^2 by DOPPLER MV Inflow E Vel=90.7 cm/sec MV Annulus E'Vel=8.8 cm/sec E/E'Ratio=10.3  Tricuspid:MILD TR No TS 261.0 cm/sec peak TR vel37.2 mmHg peak RV pressure  Pulmonary:No PR No PS 80.2 cm/sec peak vel2.6 mmHg peak grad    ___________________________________________________________________________________________ INTERPRETATION NORMAL LEFT VENTRICULAR SYSTOLIC FUNCTION WITH MILD LVH MODERATE VALVULAR REGURGITATION (See above) NO VALVULAR STENOSIS MILD PHTN Normal overall left ventricular function ejection fraction greater than 55%   ___________________________________________________________________________________________ Electronically signed by: Dorothyann Pengwayne Callwood, MD on 06/03/2015 12:55 PM Performed By: Merla Richesoar, Christy C, RDCS Ordering Physician: Dorothyann PengALLWOOD, DWAYNE  ___________________________________________________________________________________________  Status Results Details    [facility]' [email or web address] Summary

## 2016-11-16 NOTE — Pre-Procedure Instructions (Signed)
Result Impression  Normal myocardial perfusion scan no evidence of  stress-induced myocardial ischemia ejection fraction of 54%,conclusion  negative scan  Result Narrative  Procedure: Pharmacologic Myocardial Perfusion Imaging ONE day procedure  Indication: Angina at rest - Plan: NM myocardial perfusion SPECT multiple  (stress and rest), ECG stress test only Ordering Physician:   Dr. Dorothyann Pengwayne Callwood   Clinical History: 61 y.o. year old female recent anginal symptoms Vitals: Height: 65 in Weight: 287 lb Cardiac risk factors include:  Hyperlipidemia, HTN and Obesity    Procedure:  Pharmacologic stress testing was performed with Regadenoson using a single  use 0.4mg /355ml (0.08 mg/ml) prefilled syringe intravenously infused as a  bolus dose. The stress test was stopped due to Infusion completion.Blood  pressure response was normal.  Rest HR: 55bpm Rest BP: 130/3376mmHg Max HR: 93bpm Min BP: 146/7362mmHg  Stress Test Administered by: Percell BostonINA WALLACE, CMA  ECG Interpretation: Rest ZOX:WRUEAVECG:normal sinus rhythm, none Stress WUJ:WJXBJYECG:normal sinus rhythm,  Recovery NWG:NFAOZHECG:normal sinus rhythm ECG Interpretation:non-diagnostic due to pharmacologic testing.   Administrations This Visit  regadenoson (LEXISCAN) 0.4 mg/5 mL inj syringe 0.4 mg  Admin Date Action Dose Route Administered By      06/02/2015 Given 0.4 mg Intravenous Myer PeerScott N Goard, CNMT      technetium Tc2529m sestamibi (CARDIOLITE) injection 14.24 millicurie  Admin Date Action Dose Route Administered By      06/02/2015 Given by Other 14.24 millicurie Intravenous Scott N Goard,  CNMT      technetium Tc7029m sestamibi (CARDIOLITE) injection 35.46 millicurie  Admin Date Action Dose Route Administered By      06/02/2015 Given by Other 35.46 millicurie Intravenous Scott N Goard,  CNMT        Gated post-stress perfusion imaging was performed 30 minutes after stress.  Rest  images were performed 30 minutes after injection.  Gated LV Analysis:    LVEF= 54 %  FINDINGS: Regional wall motion:reveals normal myocardial thickening and wall  motion. The overall quality of the study is good. Artifacts noted: no Left ventricular cavity: normal.  Perfusion Analysis:SPECT images demonstrate homogeneous tracer  distribution throughout the myocardium.  Status Results Details

## 2016-11-16 NOTE — Pre-Procedure Instructions (Signed)
Medical clearance on chart. 

## 2016-11-16 NOTE — Patient Instructions (Signed)
  Your procedure is scheduled on: 1/11/110/18  Thurs Report to Same Day Surgery 2nd floor medical mall [facility](Medical Mall Entrance-take elevator on left to 2nd floor.  Check in with surgery information desk.) To find out your arrival time please call [phone] between 1PM - 3PM on 11/22/16 Wed  Remember: Instructions that are not followed completely may result in serious medical risk, up to and including death, or upon the discretion of your surgeon and anesthesiologist your surgery may need to be rescheduled.    _x___ 1. Do not eat food or drink liquids after midnight. No gum chewing or hard candies.     __x__ 2. No Alcohol for 24 hours before or after surgery.   __x__3. No Smoking for 24 prior to surgery.   ____  4. Bring all medications with you on the day of surgery if instructed.    __x__ 5. Notify your doctor if there is any change in your medical condition     (cold, fever, infections).     Do not wear jewelry, make-up, hairpins, clips or nail polish.  Do not wear lotions, powders, or perfumes. You may wear deodorant.  Do not shave 48 hours prior to surgery. Men may shave face and neck.  Do not bring valuables to the hospital.    [facility] is not responsible for any belongings or valuables.               Contacts, dentures or bridgework may not be worn into surgery.  Leave your suitcase in the car. After surgery it may be brought to your room.  For patients admitted to the hospital, discharge time is determined by your treatment team.   Patients discharged the day of surgery will not be allowed to drive home.  You will need someone to drive you home and stay with you the night of your procedure.    Please read over the following fact sheets that you were given:   [facility] Preparing for Surgery and or MRSA Information   _x___ Take these medicines the morning of surgery with A SIP OF WATER:    1. esomeprazole (NEXIUM  2.SYMBICORT 160  3.traMADol (ULTRAM as  needed  4.  5.  6.  ____Fleets enema or Magnesium Citrate as directed.   _x___ Use CHG Soap or sage wipes as directed on instruction sheet   ____ Use inhalers on the day of surgery and bring to hospital day of surgery  ____ Stop metformin 2 days prior to surgery    ____ Take 1/2 of usual insulin dose the night before surgery and none on the morning of           surgery.   _x___ Stop Aspirin, Coumadin, Pllavix ,Eliquis, Effient, or Pradaxa stop Aspirin per primary MD instruction and Meloxicam today  x__ Stop Anti-inflammatories such as Advil, Aleve, Ibuprofen, Motrin, Naproxen,          Naprosyn, Goodies powders or aspirin products. Ok to take Tylenol.   ____ Stop supplements until after surgery.    ____ Bring C-Pap to the hospital.

## 2016-11-22 MED ORDER — CLINDAMYCIN PHOSPHATE 900 MG/50ML IV SOLN
900.0000 mg | Freq: Once | INTRAVENOUS | Status: AC
  Administered 2016-11-23: 900 mg via INTRAVENOUS

## 2016-11-23 ENCOUNTER — Ambulatory Visit: Payer: Medicare Other | Admitting: Anesthesiology

## 2016-11-23 ENCOUNTER — Encounter: Payer: Self-pay | Admitting: Orthopedic Surgery

## 2016-11-23 ENCOUNTER — Observation Stay
Admission: RE | Admit: 2016-11-23 | Discharge: 2016-11-24 | Disposition: A | Discharge status: Home / Self Care | Payer: Medicare Other | Source: Ambulatory Visit | Attending: Orthopedic Surgery | Admitting: Orthopedic Surgery

## 2016-11-23 ENCOUNTER — Encounter
Admission: RE | Disposition: A | Discharge status: Home / Self Care | Payer: Self-pay | Source: Ambulatory Visit | Attending: Orthopedic Surgery

## 2016-11-23 DIAGNOSIS — R2681 Unsteadiness on feet: Secondary | ICD-10-CM

## 2016-11-23 DIAGNOSIS — I272 Pulmonary hypertension, unspecified: Secondary | ICD-10-CM | POA: Diagnosis not present

## 2016-11-23 DIAGNOSIS — E1122 Type 2 diabetes mellitus with diabetic chronic kidney disease: Secondary | ICD-10-CM | POA: Insufficient documentation

## 2016-11-23 DIAGNOSIS — Z79899 Other long term (current) drug therapy: Secondary | ICD-10-CM | POA: Diagnosis not present

## 2016-11-23 DIAGNOSIS — M19012 Primary osteoarthritis, left shoulder: Secondary | ICD-10-CM | POA: Diagnosis not present

## 2016-11-23 DIAGNOSIS — M75122 Complete rotator cuff tear or rupture of left shoulder, not specified as traumatic: Secondary | ICD-10-CM | POA: Diagnosis not present

## 2016-11-23 DIAGNOSIS — H409 Unspecified glaucoma: Secondary | ICD-10-CM | POA: Diagnosis not present

## 2016-11-23 DIAGNOSIS — I69351 Hemiplegia and hemiparesis following cerebral infarction affecting right dominant side: Secondary | ICD-10-CM | POA: Diagnosis not present

## 2016-11-23 DIAGNOSIS — E1140 Type 2 diabetes mellitus with diabetic neuropathy, unspecified: Secondary | ICD-10-CM | POA: Insufficient documentation

## 2016-11-23 DIAGNOSIS — Z880 Allergy status to penicillin: Secondary | ICD-10-CM | POA: Diagnosis not present

## 2016-11-23 DIAGNOSIS — I130 Hypertensive heart and chronic kidney disease with heart failure and stage 1 through stage 4 chronic kidney disease, or unspecified chronic kidney disease: Secondary | ICD-10-CM | POA: Diagnosis not present

## 2016-11-23 DIAGNOSIS — N189 Chronic kidney disease, unspecified: Secondary | ICD-10-CM | POA: Diagnosis not present

## 2016-11-23 DIAGNOSIS — Z9889 Other specified postprocedural states: Secondary | ICD-10-CM | POA: Diagnosis not present

## 2016-11-23 DIAGNOSIS — M25512 Pain in left shoulder: Secondary | ICD-10-CM

## 2016-11-23 DIAGNOSIS — Z7982 Long term (current) use of aspirin: Secondary | ICD-10-CM | POA: Insufficient documentation

## 2016-11-23 DIAGNOSIS — I252 Old myocardial infarction: Secondary | ICD-10-CM | POA: Insufficient documentation

## 2016-11-23 DIAGNOSIS — I509 Heart failure, unspecified: Secondary | ICD-10-CM | POA: Diagnosis not present

## 2016-11-23 DIAGNOSIS — K219 Gastro-esophageal reflux disease without esophagitis: Secondary | ICD-10-CM | POA: Insufficient documentation

## 2016-11-23 DIAGNOSIS — Z91030 Bee allergy status: Secondary | ICD-10-CM | POA: Insufficient documentation

## 2016-11-23 DIAGNOSIS — Z87891 Personal history of nicotine dependence: Secondary | ICD-10-CM | POA: Insufficient documentation

## 2016-11-23 DIAGNOSIS — M7552 Bursitis of left shoulder: Secondary | ICD-10-CM | POA: Diagnosis not present

## 2016-11-23 DIAGNOSIS — M6281 Muscle weakness (generalized): Secondary | ICD-10-CM

## 2016-11-23 DIAGNOSIS — M75102 Unspecified rotator cuff tear or rupture of left shoulder, not specified as traumatic: Secondary | ICD-10-CM | POA: Diagnosis present

## 2016-11-23 DIAGNOSIS — J45909 Unspecified asthma, uncomplicated: Secondary | ICD-10-CM | POA: Insufficient documentation

## 2016-11-23 DIAGNOSIS — M7542 Impingement syndrome of left shoulder: Secondary | ICD-10-CM | POA: Diagnosis not present

## 2016-11-23 HISTORY — PX: SHOULDER ARTHROSCOPY WITH OPEN ROTATOR CUFF REPAIR: SHX6092

## 2016-11-23 LAB — GLUCOSE, CAPILLARY
Glucose-Capillary: 103 mg/dL — ABNORMAL HIGH (ref 65–99)
Glucose-Capillary: 100 mg/dL — ABNORMAL HIGH (ref 65–99)

## 2016-11-23 SURGERY — ARTHROSCOPY, SHOULDER WITH REPAIR, ROTATOR CUFF, OPEN
Anesthesia: General | Laterality: Left

## 2016-11-23 MED ORDER — ONDANSETRON HCL 4 MG/2ML IJ SOLN
INTRAMUSCULAR | Status: AC
  Filled 2016-11-23: qty 2

## 2016-11-23 MED ORDER — BRIMONIDINE TARTRATE 0.2 % OP SOLN
1.0000 [drp] | Freq: Two times a day (BID) | OPHTHALMIC | Status: DC
  Administered 2016-11-23 – 2016-11-24 (×2): 1 [drp] via OPHTHALMIC
  Filled 2016-11-23: qty 5

## 2016-11-23 MED ORDER — ROCURONIUM BROMIDE 100 MG/10ML IV SOLN
INTRAVENOUS | Status: DC | PRN
  Administered 2016-11-23: 50 mg via INTRAVENOUS
  Administered 2016-11-23: 10 mg via INTRAVENOUS

## 2016-11-23 MED ORDER — PHENOL 1.4 % MT LIQD
1.0000 | OROMUCOSAL | Status: DC | PRN
  Filled 2016-11-23: qty 177

## 2016-11-23 MED ORDER — BISACODYL 10 MG RE SUPP: 10.0000 mg | Freq: Every day | RECTAL | Status: DC | PRN

## 2016-11-23 MED ORDER — SUCCINYLCHOLINE CHLORIDE 200 MG/10ML IV SOSY
PREFILLED_SYRINGE | INTRAVENOUS | Status: AC
  Filled 2016-11-23: qty 10

## 2016-11-23 MED ORDER — ROPIVACAINE HCL 5 MG/ML IJ SOLN
INTRAMUSCULAR | Status: DC | PRN
  Administered 2016-11-23: 30 mL via PERINEURAL

## 2016-11-23 MED ORDER — LIDOCAINE HCL (CARDIAC) 20 MG/ML IV SOLN
INTRAVENOUS | Status: DC | PRN
  Administered 2016-11-23: 3 mg via INTRAVENOUS

## 2016-11-23 MED ORDER — NEOMYCIN-POLYMYXIN B GU 40-200000 IR SOLN
Status: DC | PRN
  Administered 2016-11-23: 2 mL

## 2016-11-23 MED ORDER — ONDANSETRON HCL 4 MG/2ML IJ SOLN
4.0000 mg | Freq: Four times a day (QID) | INTRAMUSCULAR | Status: DC | PRN
Start: 2016-11-23 — End: 2016-11-24

## 2016-11-23 MED ORDER — DIPHENHYDRAMINE HCL 12.5 MG/5ML PO ELIX: 12.5000 mg | ORAL_SOLUTION | ORAL | Status: DC | PRN

## 2016-11-23 MED ORDER — SENNA 8.6 MG PO TABS
1.0000 | ORAL_TABLET | Freq: Two times a day (BID) | ORAL | Status: DC
  Administered 2016-11-23 – 2016-11-24 (×3): 8.6 mg via ORAL
  Filled 2016-11-23 (×4): qty 1

## 2016-11-23 MED ORDER — SUGAMMADEX SODIUM 500 MG/5ML IV SOLN
INTRAVENOUS | Status: DC | PRN
  Administered 2016-11-23: 200 mg via INTRAVENOUS
  Administered 2016-11-23: 50 mg via INTRAVENOUS

## 2016-11-23 MED ORDER — OXYCODONE HCL 5 MG/5ML PO SOLN: 5.0000 mg | Freq: Once | ORAL | Status: DC | PRN

## 2016-11-23 MED ORDER — ONDANSETRON HCL 4 MG/2ML IJ SOLN
INTRAMUSCULAR | Status: DC | PRN
  Administered 2016-11-23: 4 mg via INTRAVENOUS

## 2016-11-23 MED ORDER — LIDOCAINE HCL (PF) 1 % IJ SOLN
INTRAMUSCULAR | Status: AC
  Filled 2016-11-23: qty 30

## 2016-11-23 MED ORDER — LIDOCAINE HCL (PF) 2 % IJ SOLN
INTRAMUSCULAR | Status: DC | PRN
  Administered 2016-11-23: 3 mL

## 2016-11-23 MED ORDER — LATANOPROST 0.005 % OP SOLN
1.0000 [drp] | Freq: Every day | OPHTHALMIC | Status: DC
  Administered 2016-11-23: 1 [drp] via OPHTHALMIC
  Filled 2016-11-23: qty 2.5

## 2016-11-23 MED ORDER — BUPIVACAINE HCL (PF) 0.25 % IJ SOLN
INTRAMUSCULAR | Status: AC
  Filled 2016-11-23: qty 30

## 2016-11-23 MED ORDER — FENTANYL CITRATE (PF) 100 MCG/2ML IJ SOLN
INTRAMUSCULAR | Status: AC
  Filled 2016-11-23: qty 2

## 2016-11-23 MED ORDER — DARIFENACIN HYDROBROMIDE ER 15 MG PO TB24
15.0000 mg | ORAL_TABLET | Freq: Every day | ORAL | Status: DC
  Administered 2016-11-24: 15 mg via ORAL
  Filled 2016-11-23 (×2): qty 1

## 2016-11-23 MED ORDER — PHENYLEPHRINE HCL 10 MG/ML IJ SOLN
INTRAMUSCULAR | Status: AC
  Filled 2016-11-23: qty 1

## 2016-11-23 MED ORDER — POLYETHYLENE GLYCOL 3350 17 G PO PACK: 17.0000 g | PACK | Freq: Every day | ORAL | Status: DC | PRN

## 2016-11-23 MED ORDER — TOPIRAMATE 25 MG PO TABS
100.0000 mg | ORAL_TABLET | Freq: Every day | ORAL | Status: DC
  Administered 2016-11-23: 100 mg via ORAL
  Filled 2016-11-23: qty 4

## 2016-11-23 MED ORDER — LIDOCAINE HCL (PF) 1 % IJ SOLN
INTRAMUSCULAR | Status: AC
  Filled 2016-11-23: qty 5

## 2016-11-23 MED ORDER — FENTANYL CITRATE (PF) 100 MCG/2ML IJ SOLN
INTRAMUSCULAR | Status: AC
  Administered 2016-11-23: 50 ug via INTRAVENOUS
  Filled 2016-11-23: qty 2

## 2016-11-23 MED ORDER — FUROSEMIDE 40 MG PO TABS
40.0000 mg | ORAL_TABLET | Freq: Two times a day (BID) | ORAL | Status: DC
  Administered 2016-11-23 – 2016-11-24 (×3): 40 mg via ORAL
  Filled 2016-11-23 (×3): qty 1

## 2016-11-23 MED ORDER — ASPIRIN 81 MG PO CHEW
81.0000 mg | CHEWABLE_TABLET | Freq: Every day | ORAL | Status: DC
  Administered 2016-11-23: 81 mg via ORAL
  Filled 2016-11-23: qty 1

## 2016-11-23 MED ORDER — EPHEDRINE SULFATE 50 MG/ML IJ SOLN
INTRAMUSCULAR | Status: DC | PRN
  Administered 2016-11-23: 50 mg via INTRAVENOUS
  Administered 2016-11-23: 15 mg via INTRAVENOUS

## 2016-11-23 MED ORDER — LORATADINE 10 MG PO TABS
10.0000 mg | ORAL_TABLET | Freq: Every day | ORAL | Status: DC
  Administered 2016-11-23 – 2016-11-24 (×2): 10 mg via ORAL
  Filled 2016-11-23 (×2): qty 1

## 2016-11-23 MED ORDER — MENTHOL 3 MG MT LOZG
1.0000 | LOZENGE | OROMUCOSAL | Status: DC | PRN
  Filled 2016-11-23: qty 9

## 2016-11-23 MED ORDER — ALBUTEROL SULFATE (2.5 MG/3ML) 0.083% IN NEBU: 2.5000 mg | INHALATION_SOLUTION | Freq: Four times a day (QID) | RESPIRATORY_TRACT | Status: DC | PRN

## 2016-11-23 MED ORDER — ONDANSETRON HCL 4 MG PO TABS: 4.0000 mg | ORAL_TABLET | Freq: Four times a day (QID) | ORAL | Status: DC | PRN

## 2016-11-23 MED ORDER — BUPIVACAINE HCL 0.25 % IJ SOLN
INTRAMUSCULAR | Status: DC | PRN
  Administered 2016-11-23 (×2): 30 mL

## 2016-11-23 MED ORDER — PROMETHAZINE HCL 25 MG/ML IJ SOLN: 6.2500 mg | INTRAMUSCULAR | Status: DC | PRN

## 2016-11-23 MED ORDER — EPINEPHRINE 0.3 MG/0.3ML IJ SOAJ
0.3000 mg | Freq: Once | INTRAMUSCULAR | Status: DC | PRN
  Filled 2016-11-23 (×2): qty 0.3

## 2016-11-23 MED ORDER — ACETAMINOPHEN 325 MG PO TABS: 650.0000 mg | ORAL_TABLET | Freq: Four times a day (QID) | ORAL | Status: DC | PRN

## 2016-11-23 MED ORDER — POTASSIUM CHLORIDE CRYS ER 10 MEQ PO TBCR
10.0000 meq | EXTENDED_RELEASE_TABLET | Freq: Every day | ORAL | Status: DC
  Administered 2016-11-23 – 2016-11-24 (×2): 10 meq via ORAL
  Filled 2016-11-23 (×2): qty 1

## 2016-11-23 MED ORDER — MIDAZOLAM HCL 2 MG/2ML IJ SOLN
1.0000 mg | Freq: Once | INTRAMUSCULAR | Status: AC
  Administered 2016-11-23: 1 mg via INTRAVENOUS

## 2016-11-23 MED ORDER — ATROPINE SULFATE 0.4 MG/ML IJ SOLN
INTRAMUSCULAR | Status: AC
  Filled 2016-11-23: qty 1

## 2016-11-23 MED ORDER — EPINEPHRINE PF 1 MG/ML IJ SOLN
INTRAMUSCULAR | Status: DC | PRN
  Administered 2016-11-23: 1 mg

## 2016-11-23 MED ORDER — PHENYLEPHRINE HCL 10 MG/ML IJ SOLN
INTRAMUSCULAR | Status: DC | PRN
  Administered 2016-11-23: 40 ug via INTRAVENOUS
  Administered 2016-11-23: 150 ug via INTRAVENOUS
  Administered 2016-11-23 (×2): 80 ug via INTRAVENOUS
  Administered 2016-11-23: 50 ug via INTRAVENOUS
  Administered 2016-11-23: 80 ug via INTRAVENOUS
  Administered 2016-11-23: 40 ug via INTRAVENOUS

## 2016-11-23 MED ORDER — FLUTICASONE PROPIONATE 50 MCG/ACT NA SUSP
1.0000 | Freq: Every day | NASAL | Status: DC | PRN
  Filled 2016-11-23: qty 16

## 2016-11-23 MED ORDER — EPHEDRINE 5 MG/ML INJ
INTRAVENOUS | Status: AC
  Filled 2016-11-23: qty 10

## 2016-11-23 MED ORDER — EPINEPHRINE 30 MG/30ML IJ SOLN
INTRAMUSCULAR | Status: AC
  Filled 2016-11-23: qty 1

## 2016-11-23 MED ORDER — PANTOPRAZOLE SODIUM 40 MG PO TBEC
40.0000 mg | DELAYED_RELEASE_TABLET | Freq: Every day | ORAL | Status: DC
  Administered 2016-11-24: 40 mg via ORAL
  Filled 2016-11-23: qty 1

## 2016-11-23 MED ORDER — SUGAMMADEX SODIUM 200 MG/2ML IV SOLN
INTRAVENOUS | Status: AC
  Filled 2016-11-23: qty 2

## 2016-11-23 MED ORDER — NEOMYCIN-POLYMYXIN B GU 40-200000 IR SOLN
Status: AC
  Filled 2016-11-23: qty 2

## 2016-11-23 MED ORDER — HYDROMORPHONE HCL 1 MG/ML IJ SOLN
0.5000 mg | INTRAMUSCULAR | Status: DC | PRN
  Administered 2016-11-23 (×2): 0.5 mg via INTRAVENOUS
  Filled 2016-11-23 (×2): qty 1

## 2016-11-23 MED ORDER — FENTANYL CITRATE (PF) 100 MCG/2ML IJ SOLN
25.0000 ug | INTRAMUSCULAR | Status: DC | PRN
  Administered 2016-11-23 (×2): 50 ug via INTRAVENOUS

## 2016-11-23 MED ORDER — MIDAZOLAM HCL 2 MG/2ML IJ SOLN
INTRAMUSCULAR | Status: AC
  Administered 2016-11-23: 1 mg via INTRAVENOUS
  Filled 2016-11-23: qty 2

## 2016-11-23 MED ORDER — GLYCOPYRROLATE 0.2 MG/ML IJ SOLN
INTRAMUSCULAR | Status: AC
  Filled 2016-11-23: qty 1

## 2016-11-23 MED ORDER — ROCURONIUM BROMIDE 50 MG/5ML IV SOSY
PREFILLED_SYRINGE | INTRAVENOUS | Status: AC
  Filled 2016-11-23: qty 5

## 2016-11-23 MED ORDER — MEPERIDINE HCL 25 MG/ML IJ SOLN: 6.2500 mg | INTRAMUSCULAR | Status: DC | PRN

## 2016-11-23 MED ORDER — DOCUSATE SODIUM 100 MG PO CAPS
100.0000 mg | ORAL_CAPSULE | Freq: Two times a day (BID) | ORAL | Status: DC
  Administered 2016-11-23 – 2016-11-24 (×3): 100 mg via ORAL
  Filled 2016-11-23 (×3): qty 1

## 2016-11-23 MED ORDER — FENTANYL CITRATE (PF) 100 MCG/2ML IJ SOLN
INTRAMUSCULAR | Status: DC | PRN
  Administered 2016-11-23: 25 ug via INTRAVENOUS
  Administered 2016-11-23 (×2): 50 ug via INTRAVENOUS

## 2016-11-23 MED ORDER — METOCLOPRAMIDE HCL 5 MG/ML IJ SOLN: 5.0000 mg | Freq: Three times a day (TID) | INTRAMUSCULAR | Status: DC | PRN

## 2016-11-23 MED ORDER — MOMETASONE FURO-FORMOTEROL FUM 200-5 MCG/ACT IN AERO
2.0000 | INHALATION_SPRAY | Freq: Two times a day (BID) | RESPIRATORY_TRACT | Status: DC
  Administered 2016-11-23 – 2016-11-24 (×2): 2 via RESPIRATORY_TRACT
  Filled 2016-11-23: qty 8.8

## 2016-11-23 MED ORDER — MAGNESIUM CITRATE PO SOLN
1.0000 | Freq: Once | ORAL | Status: DC | PRN
  Filled 2016-11-23: qty 296

## 2016-11-23 MED ORDER — MIDAZOLAM HCL 2 MG/2ML IJ SOLN
INTRAMUSCULAR | Status: AC
  Filled 2016-11-23: qty 2

## 2016-11-23 MED ORDER — LABETALOL HCL 5 MG/ML IV SOLN
INTRAVENOUS | Status: AC
  Filled 2016-11-23: qty 4

## 2016-11-23 MED ORDER — OXYCODONE HCL 5 MG PO TABS: 5.0000 mg | ORAL_TABLET | Freq: Once | ORAL | Status: DC | PRN

## 2016-11-23 MED ORDER — TIZANIDINE HCL 4 MG PO TABS
4.0000 mg | ORAL_TABLET | Freq: Four times a day (QID) | ORAL | Status: DC | PRN
  Administered 2016-11-24: 4 mg via ORAL
  Filled 2016-11-23: qty 1

## 2016-11-23 MED ORDER — DULOXETINE HCL 60 MG PO CPEP
60.0000 mg | ORAL_CAPSULE | Freq: Every day | ORAL | Status: DC
  Administered 2016-11-23: 60 mg via ORAL
  Filled 2016-11-23: qty 1

## 2016-11-23 MED ORDER — ACETAMINOPHEN 10 MG/ML IV SOLN
INTRAVENOUS | Status: DC | PRN
  Administered 2016-11-23: 1000 mg via INTRAVENOUS

## 2016-11-23 MED ORDER — CLINDAMYCIN PHOSPHATE 900 MG/50ML IV SOLN
900.0000 mg | Freq: Four times a day (QID) | INTRAVENOUS | Status: AC
  Administered 2016-11-23 – 2016-11-24 (×3): 900 mg via INTRAVENOUS
  Filled 2016-11-23 (×4): qty 50

## 2016-11-23 MED ORDER — ACETAMINOPHEN 650 MG RE SUPP: 650.0000 mg | Freq: Four times a day (QID) | RECTAL | Status: DC | PRN

## 2016-11-23 MED ORDER — PROPOFOL 10 MG/ML IV BOLUS
INTRAVENOUS | Status: AC
  Filled 2016-11-23: qty 20

## 2016-11-23 MED ORDER — CLINDAMYCIN PHOSPHATE 900 MG/50ML IV SOLN
INTRAVENOUS | Status: AC
  Filled 2016-11-23: qty 50

## 2016-11-23 MED ORDER — METOCLOPRAMIDE HCL 10 MG PO TABS: 5.0000 mg | ORAL_TABLET | Freq: Three times a day (TID) | ORAL | Status: DC | PRN

## 2016-11-23 MED ORDER — ATORVASTATIN CALCIUM 20 MG PO TABS
40.0000 mg | ORAL_TABLET | Freq: Every day | ORAL | Status: DC
  Administered 2016-11-23: 40 mg via ORAL
  Filled 2016-11-23: qty 2

## 2016-11-23 MED ORDER — MELOXICAM 7.5 MG PO TABS
15.0000 mg | ORAL_TABLET | Freq: Every day | ORAL | Status: DC
  Administered 2016-11-23 – 2016-11-24 (×2): 15 mg via ORAL
  Filled 2016-11-23 (×2): qty 2

## 2016-11-23 MED ORDER — BRIMONIDINE TARTRATE-TIMOLOL 0.2-0.5 % OP SOLN: 1.0000 [drp] | Freq: Two times a day (BID) | OPHTHALMIC | Status: DC

## 2016-11-23 MED ORDER — PROPOFOL 10 MG/ML IV BOLUS
INTRAVENOUS | Status: DC | PRN
  Administered 2016-11-23: 120 mg via INTRAVENOUS

## 2016-11-23 MED ORDER — SODIUM CHLORIDE 0.9 % IV SOLN
INTRAVENOUS | Status: DC
  Administered 2016-11-23 (×2): via INTRAVENOUS

## 2016-11-23 MED ORDER — ROPIVACAINE HCL 5 MG/ML IJ SOLN
INTRAMUSCULAR | Status: AC
  Filled 2016-11-23: qty 40

## 2016-11-23 MED ORDER — LABETALOL HCL 5 MG/ML IV SOLN
INTRAVENOUS | Status: DC | PRN
  Administered 2016-11-23: 5 mg via INTRAVENOUS

## 2016-11-23 MED ORDER — TIMOLOL MALEATE 0.5 % OP SOLN
1.0000 [drp] | Freq: Two times a day (BID) | OPHTHALMIC | Status: DC
  Administered 2016-11-23 – 2016-11-24 (×2): 1 [drp] via OPHTHALMIC
  Filled 2016-11-23: qty 5

## 2016-11-23 MED ORDER — OXYCODONE HCL 5 MG PO TABS
5.0000 mg | ORAL_TABLET | ORAL | Status: DC | PRN
  Administered 2016-11-23 – 2016-11-24 (×6): 10 mg via ORAL
  Filled 2016-11-23 (×5): qty 2

## 2016-11-23 MED ORDER — ACETAMINOPHEN 10 MG/ML IV SOLN
INTRAVENOUS | Status: AC
  Filled 2016-11-23: qty 100

## 2016-11-23 MED ORDER — GLYCOPYRROLATE 0.2 MG/ML IJ SOLN
INTRAMUSCULAR | Status: DC | PRN
  Administered 2016-11-23: .2 mg via INTRAVENOUS

## 2016-11-23 MED ORDER — ALUM & MAG HYDROXIDE-SIMETH 200-200-20 MG/5ML PO SUSP: 30.0000 mL | ORAL | Status: DC | PRN

## 2016-11-23 SURGICAL SUPPLY — 68 items
ADAPTER IRRIG TUBE 2 SPIKE SOL (ADAPTER) ×4 IMPLANT
ANCHOR ALL-SUT Q-FIX 2.8 (Anchor) ×4 IMPLANT
BUR RADIUS 4.0X18.5 (BURR) ×2 IMPLANT
BUR RADIUS 5.5 (BURR) ×2 IMPLANT
CANISTER SUCT LVC 12 LTR MEDI- (MISCELLANEOUS) ×2 IMPLANT
CANNULA 5.75X7 CRYSTAL CLEAR (CANNULA) ×6 IMPLANT
CANNULA PARTIAL THREAD 2X7 (CANNULA) ×2 IMPLANT
CANNULA TWIST IN 8.25X9CM (CANNULA) ×4 IMPLANT
CONNECTOR PERFECT PASSER (CONNECTOR) ×2 IMPLANT
COOLER POLAR GLACIER W/PUMP (MISCELLANEOUS) ×2 IMPLANT
CRADLE LAMINECT ARM (MISCELLANEOUS) ×2 IMPLANT
DEVICE SUCT BLK HOLE OR FLOOR (MISCELLANEOUS) IMPLANT
DRAPE IMP U-DRAPE 54X76 (DRAPES) ×4 IMPLANT
DRAPE INCISE IOBAN 66X45 STRL (DRAPES) ×2 IMPLANT
DRAPE SHEET LG 3/4 BI-LAMINATE (DRAPES) ×2 IMPLANT
DRAPE U-SHAPE 47X51 STRL (DRAPES) ×2 IMPLANT
DURAPREP 26ML APPLICATOR (WOUND CARE) ×6 IMPLANT
ELECT REM PT RETURN 9FT ADLT (ELECTROSURGICAL) ×2
ELECTRODE REM PT RTRN 9FT ADLT (ELECTROSURGICAL) ×1 IMPLANT
GAUZE PETRO XEROFOAM 1X8 (MISCELLANEOUS) ×2 IMPLANT
GAUZE SPONGE 4X4 12PLY STRL (GAUZE/BANDAGES/DRESSINGS) ×4 IMPLANT
GLOVE BIOGEL PI IND STRL 9 (GLOVE) ×1 IMPLANT
GLOVE BIOGEL PI INDICATOR 9 (GLOVE) ×1
GLOVE SURG 9.0 ORTHO LTXF (GLOVE) ×4 IMPLANT
GOWN STRL REUS TWL 2XL XL LVL4 (GOWN DISPOSABLE) ×2 IMPLANT
GOWN STRL REUS W/ TWL LRG LVL3 (GOWN DISPOSABLE) ×1 IMPLANT
GOWN STRL REUS W/ TWL LRG LVL4 (GOWN DISPOSABLE) ×1 IMPLANT
GOWN STRL REUS W/TWL LRG LVL3 (GOWN DISPOSABLE) ×1
GOWN STRL REUS W/TWL LRG LVL4 (GOWN DISPOSABLE) ×1
IV LACTATED RINGER IRRG 3000ML (IV SOLUTION) ×13
IV LR IRRIG 3000ML ARTHROMATIC (IV SOLUTION) ×13 IMPLANT
KIT RM TURNOVER STRD PROC AR (KITS) ×2 IMPLANT
KIT STABILIZATION SHOULDER (MISCELLANEOUS) ×2 IMPLANT
KIT SUTURE 2.8 Q-FIX DISP (MISCELLANEOUS) ×2 IMPLANT
KIT SUTURETAK 3.0 INSERT PERC (KITS) IMPLANT
MANIFOLD NEPTUNE II (INSTRUMENTS) ×2 IMPLANT
MASK FACE SPIDER DISP (MASK) ×2 IMPLANT
MAT BLUE FLOOR 46X72 FLO (MISCELLANEOUS) ×2 IMPLANT
NDL SAFETY 18GX1.5 (NEEDLE) ×2 IMPLANT
NDL SAFETY 22GX1.5 (NEEDLE) ×2 IMPLANT
NS IRRIG 500ML POUR BTL (IV SOLUTION) ×2 IMPLANT
PACK ARTHROSCOPY SHOULDER (MISCELLANEOUS) ×2 IMPLANT
PAD WRAPON POLAR SHDR XLG (MISCELLANEOUS) ×1 IMPLANT
PASSER SUT CAPTURE FIRST (SUTURE) ×2 IMPLANT
SET TUBE SUCT SHAVER OUTFL 24K (TUBING) ×2 IMPLANT
SET TUBE TIP INTRA-ARTICULAR (MISCELLANEOUS) ×2 IMPLANT
STRAP SAFETY BODY (MISCELLANEOUS) ×2 IMPLANT
STRIP CLOSURE SKIN 1/2X4 (GAUZE/BANDAGES/DRESSINGS) ×2 IMPLANT
SUT ETHILON 4-0 (SUTURE) ×1
SUT ETHILON 4-0 FS2 18XMFL BLK (SUTURE) ×1
SUT KNTLS 2.8 MAGNUM (Anchor) ×8 IMPLANT
SUT LASSO 90 DEG SD STR (SUTURE) IMPLANT
SUT MNCRL 4-0 (SUTURE) ×1
SUT MNCRL 4-0 27XMFL (SUTURE) ×1
SUT PDS AB 0 CT1 27 (SUTURE) ×4 IMPLANT
SUT PERFECTPASSER WHITE CART (SUTURE) ×8 IMPLANT
SUT SMART STITCH CARTRIDGE (SUTURE) ×8 IMPLANT
SUT VIC AB 0 CT1 36 (SUTURE) ×4 IMPLANT
SUT VIC AB 2-0 CT2 27 (SUTURE) ×2 IMPLANT
SUTURE ETHLN 4-0 FS2 18XMF BLK (SUTURE) ×1 IMPLANT
SUTURE MAGNUM WIRE 2X48 BLK (SUTURE) ×4 IMPLANT
SUTURE MNCRL 4-0 27XMF (SUTURE) ×1 IMPLANT
SYRINGE 10CC LL (SYRINGE) ×2 IMPLANT
TAPE MICROFOAM 4IN (TAPE) ×2 IMPLANT
TUBING ARTHRO INFLOW-ONLY STRL (TUBING) ×2 IMPLANT
TUBING CONNECTING 10 (TUBING) ×2 IMPLANT
WAND HAND CNTRL MULTIVAC 90 (MISCELLANEOUS) ×2 IMPLANT
WRAPON POLAR PAD SHDR XLG (MISCELLANEOUS) ×2

## 2016-11-23 NOTE — Anesthesia Preprocedure Evaluation (Signed)
Anesthesia Evaluation  Patient identified by MRN, date of birth, ID band Patient awake    Reviewed: Allergy & Precautions, NPO status , Patient's Chart, lab work & pertinent test results  History of Anesthesia Complications Negative for: history of anesthetic complications  Airway Mallampati: II  TM Distance: >3 FB Neck ROM: Full    Dental no notable dental hx.    Pulmonary asthma , neg sleep apnea, neg COPD, former smoker,    breath sounds clear to auscultation- rhonchi (-) wheezing      Cardiovascular hypertension, (-) angina+ Past MI and +CHF (diastolic)   Rhythm:Regular Rate:Normal - Systolic murmurs and - Diastolic murmurs Echo 06/02/15: NORMAL LEFT VENTRICULAR SYSTOLIC FUNCTION WITH MILD LVH MODERATE VALVULAR REGURGITATION (See above) NO VALVULAR STENOSIS MILD PHTN Normal overall left ventricular function ejection fraction greater than 55%  NM stress test 06/02/15: negative for ischemia   Neuro/Psych  Headaches, CVA (R sided weakness), Residual Symptoms negative psych ROS   GI/Hepatic Neg liver ROS, GERD  ,  Endo/Other  diabetes  Renal/GU negative Renal ROS     Musculoskeletal  (+) Arthritis ,   Abdominal (+) + obese,   Peds  Hematology negative hematology ROS (+)   Anesthesia Other Findings Past Medical History: No date: Arthritis No date: Asthma No date: Bladder leak No date: Cerebral aneurysm     Comment: history-has coils No date: CHF (congestive heart failure) (HCC) No date: Chronic kidney disease No date: Diabetes mellitus without complication (HCC) No date: Dyspnea No date: GERD (gastroesophageal reflux disease) No date: Glaucoma No date: Headache No date: Hyperlipidemia No date: Hypertension No date: MI (myocardial infarction) No date: Neuropathy (HCC) No date: Rotator cuff injury No date: Seasonal allergies No date: Stroke [facility](HCC)     Comment: TIA's   Reproductive/Obstetrics                              Anesthesia Physical Anesthesia Plan  ASA: III  Anesthesia Plan: General   Post-op Pain Management:  Regional for Post-op pain   Induction: Intravenous  Airway Management Planned: Oral ETT  Additional Equipment:   Intra-op Plan:   Post-operative Plan: Extubation in OR  Informed Consent: I have reviewed the patients History and Physical, chart, labs and discussed the procedure including the risks, benefits and alternatives for the proposed anesthesia with the patient or authorized representative who has indicated his/her understanding and acceptance.   Dental advisory given  Plan Discussed with: CRNA and Anesthesiologist  Anesthesia Plan Comments:         Anesthesia Quick Evaluation

## 2016-11-23 NOTE — Anesthesia Procedure Notes (Signed)
Anesthesia Regional Block:  Interscalene brachial plexus block  Pre-Anesthetic Checklist: ,, timeout performed, Correct Patient, Correct Site, Correct Laterality, Correct Procedure, Correct Position, site marked, Risks and benefits discussed,  Surgical consent,  Pre-op evaluation,  At surgeon's request and post-op pain management  Laterality: Left  Prep: chloraprep       Needles:  Injection technique: Single-shot  Needle Type: Stimiplex     Needle Length: 9cm 9 cm Needle Gauge: 22 and 22 G    Additional Needles:  Procedures: ultrasound guided (picture in chart) Interscalene brachial plexus block Narrative:  Start time: 11/23/2016 7:25 AM End time: 11/23/2016 7:35 AM Injection made incrementally with aspirations every 5 mL.  Performed by: Personally  Anesthesiologist: Japleen Tornow  Additional Notes: Functioning IV was confirmed and monitors were applied.  A 50mm 22ga Stimuplex needle was used. Sterile prep and drape,hand hygiene and sterile gloves were used.  Negative aspiration and negative test dose prior to incremental administration of local anesthetic. The patient tolerated the procedure well.

## 2016-11-23 NOTE — Anesthesia Postprocedure Evaluation (Signed)
Anesthesia Post Note  Patient: Sharyn Drossathy A Vanhandel  Procedure(s) Performed: Procedure(s) (LRB): SHOULDER ARTHROSCOPY WITH OPEN ROTATOR CUFF REPAIR, ARTHROSCOPIC SUBACROMIAL DECOMPRESSION, DISTAL CLAVICLE EXCISION (Left)  Patient location during evaluation: PACU Anesthesia Type: General Level of consciousness: awake and alert and oriented Pain management: pain level controlled Vital Signs Assessment: post-procedure vital signs reviewed and stable Respiratory status: spontaneous breathing, nonlabored ventilation and respiratory function stable Cardiovascular status: blood pressure returned to baseline and stable Postop Assessment: no signs of nausea or vomiting Anesthetic complications: no     Last Vitals:  Vitals:   11/23/16 1147 11/23/16 1155  BP:  (!) 133/94  Pulse: 81 83  Resp: 18 15  Temp:      Last Pain:  Vitals:   11/23/16 0614  TempSrc: Oral  PainSc: 9                  Maeleigh Buschman

## 2016-11-23 NOTE — H&P (Signed)
The patient has been re-examined, and the chart reviewed, and there have been no interval changes to the documented history and physical.    The risks, benefits, and alternatives have been discussed at length, and the patient is willing to proceed.   

## 2016-11-23 NOTE — Transfer of Care (Signed)
Immediate Anesthesia Transfer of Care Note  Patient: Kristine Rangel  Procedure(s) Performed: Procedure(s): SHOULDER ARTHROSCOPY WITH OPEN ROTATOR CUFF REPAIR, ARTHROSCOPIC SUBACROMIAL DECOMPRESSION, DISTAL CLAVICLE EXCISION (Left)  Patient Location: PACU  Anesthesia Type:General  Level of Consciousness: sedated  Airway & Oxygen Therapy: Patient Spontanous Breathing and Patient connected to face mask oxygen  Post-op Assessment: Report given to RN and Post -op Vital signs reviewed and stable  Post vital signs: Reviewed and stable  Last Vitals:  Vitals:   11/23/16 0750 11/23/16 1125  BP:  138/77  Pulse: 80 86  Resp: 20 20  Temp:  36.3 C    Last Pain:  Vitals:   11/23/16 0614  TempSrc: Oral  PainSc: 9          Complications: No apparent anesthesia complications

## 2016-11-23 NOTE — Op Note (Signed)
11/23/2016  11:32 AM  PATIENT:  Kristine Rangel  61 y.o. female  PRE-OPERATIVE DIAGNOSIS: Left shoulder partial versus full-thickness rotator cuff tear, acromioclavicular arthritis, shoulder impingement  POST-OPERATIVE DIAGNOSIS:  Full thickness rotator cuff tear involving the supra and infraspinatus, subacromial impingement and acromioclavicular arthritis  PROCEDURE:  Procedure(s): LEFT SHOULDER ARTHROSCOPY WITH OPEN ROTATOR CUFF REPAIR, ARTHROSCOPIC SUBACROMIAL DECOMPRESSION, DISTAL CLAVICLE EXCISION   SURGEON:  Surgeon(s) and Role:    * Thornton Park, MD - Primary  ANESTHESIA:   local, regional and general   PREOPERATIVE INDICATIONS:  [name] is a  60 y.o. female with a diagnosis of partial versus full-thickness tear of her left rotator cuff who failed conservative measures and elected for surgical management and elected for surgical management.  MRI suggested a full-thickness tear involving the supraspinatus.    The risks benefits and alternatives were discussed with the patient preoperatively including but not limited to the risks of infection, bleeding, nerve injury, persistent pain or weakness, failure of the hardware, re-tear of the rotator cuff and the need for further surgery. Medical risks include DVT and pulmonary embolism, myocardial infarction, stroke, pneumonia, respiratory failure and death. Patient understood these risks and wished to proceed.  OPERATIVE IMPLANTS: ArthroCare Magnum 2 anchors x 4 & Smith [employer]hew Q Fix anchors x 2  OPERATIVE FINDINGS: Full-thickness tear of the supraspinatus extending into the infraspinatus, spontaneous rupture of the long head of the biceps tendon, no glenohumeral joint arthrosis for labral tears, extensive before meals joint arthrosis and subacromial impingement with resulting bursitis.  OPERATIVE PROCEDURE: The patient was met in the preoperative area. The left shoulder was signed with the word yes and my initials according the  hospital's correct site of surgery protocol.  History and physical was updated. Patient underwent an interscalene block in the preoperative area by the anesthesia service. Patient was brought to the operating room where she underwent general anesthesia. The patient was placed in a beachchair position.  A spider arm positioner was used for this case. Examination under anesthesia revealed no limitation of motion or instability with load shift testing. The patient had a negative sulcus sign.  Patient was prepped and draped in a sterile fashion. A timeout was performed to verify the patient's name, date of birth, medical record number, correct site of surgery and correct procedure to be performed there was also used to verify the patient received antibiotics that all appropriate instruments, implants and radiographs studies were available in the room. Once all in attendance were in agreement case began.  Bony landmarks were drawn out with a surgical marker along with proposed arthroscopy incisions. These were pre-injected with 1% lidocaine plain. An 11 blade was used to establish a posterior portal through which the arthroscope was placed in the glenohumeral joint. A full diagnostic examination of the shoulder was performed.  Patient was found to have a full-thickness tear involving the supraspinatus, starting into the infraspinatus. Patient had a prior spontaneous rupture of the long head of the biceps tendon. Biceps stump was debrided with a 4.0 resector shaver blade.  The arthroscope was then placed in the subacromial space.   Extensive bursitis was encountered and debrided using a 4-0 resector shaver blade and a 90 ArthroCare wand from a lateral portal which was established under direct visualization using an 18-gauge spinal needle. A subacromial decompression was also performed using a 5.5 mm resector shaver blade from the lateral portal. A distal clavicle excision was then performed with the 5.5 resector  shaver  blade through the anterior portal. All arthroscopic instruments were then removed.  Two Perfect Pass suture were placed in the lateral border of the rotator cuff tear. All arthroscopic instruments were then removed and the mini-open portion of the procedure began.   A saber-type incision was made along the lateral border of the acromion. The deltoid muscle was identified and split in line with its fibers which allowed visualization of the rotator cuff. The Perfect Pass sutures previously placed in the lateral border of the rotator cuff were brought out through the deltoid split.  A 5.5 mm resector shaver blade was then used to debride the greater tuberosity of all torn fibers of the rotator cuff.  Two Smith and [employer] Fix anchors were placed at the articular margin of the humeral head with the greater tuberosity. The suture limbs of the Q Fix anchor were passed medially through the rotator cuff using a First Pass suture passer and clamped for later repair.   Two additional Perfect Pass sutures were then placed in the lateral border of the rotator cuff. The four Perfect Pass sutures were then anchored to the greater tuberosity footprint using four Magnum 2 anchors. These anchors were tensioned to allow for anatomic reduction of the rotator cuff to the greater tuberosity. The medial row sutures were then tied down using an arthroscopic knot tying technique.  Arthroscopic images of the repair were taken with the arthroscope both externally and from inside the glenohumeral joint.  All incisions were copiously irrigated. The deltoid fascia was repaired using a 0 Vicryl suture.  The subcutaneous tissue of all incisions were closed with a 2-0 Vicryl. Skin closure for the arthroscopic incisions was performed with 4-0 nylon. The skin edges of the saber incision was approximated with a running 4-0 undyed Monocryl.  0.25% Marcaine plain was then injected into the subacromial space for postoperative pain control. A  dry sterile dressing was applied.  The patient was placed in an abduction sling, with a Polar Care sleeve, a TENS unit.  All sharp and it instrument counts were correct at the conclusion of the case. I was scrubbed and present for the entire case. I spoke with the patient's family postoperatively to let them know the case had gone without complication and the patient was stable in recovery room.

## 2016-11-23 NOTE — Anesthesia Procedure Notes (Signed)
Procedure Name: Intubation Date/Time: 11/23/2016 8:15 AM Performed by: Henrietta HooverPOPE, Jeni Duling Pre-anesthesia Checklist: Patient identified, Emergency Drugs available, Suction available, Patient being monitored and Timeout performed Patient Re-evaluated:Patient Re-evaluated prior to inductionOxygen Delivery Method: Circle system utilized Preoxygenation: Pre-oxygenation with 100% oxygen Intubation Type: IV induction Ventilation: Mask ventilation without difficulty Tube type: Oral Tube size: 7.0 mm Number of attempts: 1 Airway Equipment and Method: Stylet Placement Confirmation: ETT inserted through vocal cords under direct vision,  positive ETCO2 and breath sounds checked- equal and bilateral Secured at: 22 cm Tube secured with: Tape Dental Injury: Teeth and Oropharynx as per pre-operative assessment

## 2016-11-23 NOTE — [place] FL2 (Signed)
[place] MEDICAID FL2 LEVEL OF CARE SCREENING TOOL     IDENTIFICATION  Patient Name: Kristine Rangel Birthdate: 28-May-1956 Sex: female Admission Date (Current Location): 11/23/2016  [facility] and [place] Number:  [address]  ([id number]) Facility and Address:  [facility], [address] [place], [place] [address]      Provider Number: [id number]  Attending Physician Name and Address:  Juanell Fairly, MD  Relative Name and Phone Number:       Current Level of Care: Hospital Recommended Level of Care: Skilled Nursing Facility Prior Approval Number:    Date Approved/Denied:   PASRR Number:  [id number])  Discharge Plan: SNF    Current Diagnoses: Patient Active Problem List   Diagnosis Date Noted  . S/P left rotator cuff repair 11/23/2016    Orientation RESPIRATION BLADDER Height & Weight     Self, Time, Situation, Place  Normal Continent Weight:   Height:     BEHAVIORAL SYMPTOMS/MOOD NEUROLOGICAL BOWEL NUTRITION STATUS   (none)  (none) Continent Diet (Regular Diet)  AMBULATORY STATUS COMMUNICATION OF NEEDS Skin   Extensive Assist Verbally Surgical wounds (Incision: Left Shoulder )                       Personal Care Assistance Level of Assistance  Bathing, Feeding, Dressing Bathing Assistance: Limited assistance Feeding assistance: Independent Dressing Assistance: Limited assistance     Functional Limitations Info  Sight, Hearing, Speech Sight Info: Adequate Hearing Info: Adequate Speech Info: Adequate    SPECIAL CARE FACTORS FREQUENCY  PT (By licensed PT), OT (By licensed OT)     PT Frequency:  (5) OT Frequency:  (5)            Contractures      Additional Factors Info  Code Status, Allergies Code Status Info:  (Full Code. ) Allergies Info:  (Bee Venom, Penicillins)           Current Medications (11/23/2016):  This is the current hospital active medication list Current Facility-Administered Medications   Medication Dose Route Frequency Provider Last Rate Last Dose  . acetaminophen (TYLENOL) tablet 650 mg  650 mg Oral Q6H PRN Juanell Fairly, MD       Or  . acetaminophen (TYLENOL) suppository 650 mg  650 mg Rectal Q6H PRN Juanell Fairly, MD      . albuterol (PROVENTIL) (2.5 MG/3ML) 0.083% nebulizer solution 2.5 mg  2.5 mg Nebulization Q6H PRN Juanell Fairly, MD      . alum & mag hydroxide-simeth (MAALOX/MYLANTA) 200-200-20 MG/5ML suspension 30 mL  30 mL Oral Q4H PRN Juanell Fairly, MD      . aspirin chewable tablet 81 mg  81 mg Oral QHS Juanell Fairly, MD      . atorvastatin (LIPITOR) tablet 40 mg  40 mg Oral QHS Juanell Fairly, MD      . bisacodyl (DULCOLAX) suppository 10 mg  10 mg Rectal Daily PRN Juanell Fairly, MD      . brimonidine (ALPHAGAN) 0.2 % ophthalmic solution 1 drop  1 drop Both Eyes BID Valentina Gu, RPH       And  . timolol (TIMOPTIC) 0.5 % ophthalmic solution 1 drop  1 drop Both Eyes BID Valentina Gu, RPH      . clindamycin (CLEOCIN) IVPB 900 mg  900 mg Intravenous Q6H Juanell Fairly, MD      . darifenacin (ENABLEX) 24 hr tablet 15 mg  15 mg Oral Daily Juanell Fairly, MD      .  diphenhydrAMINE (BENADRYL) 12.5 MG/5ML elixir 12.5-25 mg  12.5-25 mg Oral Q4H PRN Juanell FairlyKevin Krasinski, MD      . docusate sodium (COLACE) capsule 100 mg  100 mg Oral BID Juanell FairlyKevin Krasinski, MD      . DULoxetine (CYMBALTA) DR capsule 60 mg  60 mg Oral QHS Juanell FairlyKevin Krasinski, MD      . EPINEPHrine (EPI-PEN) injection 0.3 mg  0.3 mg Subcutaneous Once PRN Juanell FairlyKevin Krasinski, MD      . fluticasone (FLONASE) 50 MCG/ACT nasal spray 1 spray  1 spray Each Nare Daily PRN Juanell FairlyKevin Krasinski, MD      . furosemide (LASIX) tablet 40 mg  40 mg Oral BID WC Juanell FairlyKevin Krasinski, MD      . HYDROmorphone (DILAUDID) injection 0.5 mg  0.5 mg Intravenous Q2H PRN Juanell FairlyKevin Krasinski, MD      . latanoprost (XALATAN) 0.005 % ophthalmic solution 1 drop  1 drop Both Eyes QHS Juanell FairlyKevin Krasinski, MD      . lidocaine (PF) (XYLOCAINE) 1 %  injection           . loratadine (CLARITIN) tablet 10 mg  10 mg Oral Daily Juanell FairlyKevin Krasinski, MD      . magnesium citrate solution 1 Bottle  1 Bottle Oral Once PRN Juanell FairlyKevin Krasinski, MD      . meloxicam [facility](MOBIC) tablet 15 mg  15 mg Oral Daily Juanell FairlyKevin Krasinski, MD      . menthol-cetylpyridinium (CEPACOL) lozenge 3 mg  1 lozenge Oral PRN Juanell FairlyKevin Krasinski, MD       Or  . phenol (CHLORASEPTIC) mouth spray 1 spray  1 spray Mouth/Throat PRN Juanell FairlyKevin Krasinski, MD      . metoCLOPramide (REGLAN) tablet 5-10 mg  5-10 mg Oral Q8H PRN Juanell FairlyKevin Krasinski, MD       Or  . metoCLOPramide (REGLAN) injection 5-10 mg  5-10 mg Intravenous Q8H PRN Juanell FairlyKevin Krasinski, MD      . mometasone-formoterol [facility](DULERA) 200-5 MCG/ACT inhaler 2 puff  2 puff Inhalation BID Juanell FairlyKevin Krasinski, MD      . ondansetron [facility](ZOFRAN) tablet 4 mg  4 mg Oral Q6H PRN Juanell FairlyKevin Krasinski, MD       Or  . ondansetron [facility](ZOFRAN) injection 4 mg  4 mg Intravenous Q6H PRN Juanell FairlyKevin Krasinski, MD      . oxyCODONE (Oxy IR/ROXICODONE) immediate release tablet 5-10 mg  5-10 mg Oral Q3H PRN Juanell FairlyKevin Krasinski, MD      . Melene Muller[START ON 11/24/2016] pantoprazole (PROTONIX) EC tablet 40 mg  40 mg Oral Daily Juanell FairlyKevin Krasinski, MD      . polyethylene glycol (MIRALAX / GLYCOLAX) packet 17 g  17 g Oral Daily PRN Juanell FairlyKevin Krasinski, MD      . potassium chloride (K-DUR,KLOR-CON) CR tablet 10 mEq  10 mEq Oral Daily Juanell FairlyKevin Krasinski, MD      . [facility](SENOKOT) tablet 8.6 mg  1 tablet Oral BID Juanell FairlyKevin Krasinski, MD      . tiZANidine (ZANAFLEX) tablet 4 mg  4 mg Oral Q6H PRN Juanell FairlyKevin Krasinski, MD      . topiramate (TOPAMAX) tablet 100 mg  100 mg Oral QHS Juanell FairlyKevin Krasinski, MD         Discharge Medications: Please see discharge summary for a list of discharge medications.  Relevant Imaging Results:  Relevant Lab Results:   Additional Information  (SSN: [id number])  Dulse Rutan, Darleen CrockerBailey M, LCSW

## 2016-11-24 DIAGNOSIS — M75122 Complete rotator cuff tear or rupture of left shoulder, not specified as traumatic: Secondary | ICD-10-CM | POA: Diagnosis not present

## 2016-11-24 LAB — CBC
HCT: 34.5 % — ABNORMAL LOW (ref 35.0–47.0)
Hemoglobin: 11.4 g/dL — ABNORMAL LOW (ref 12.0–16.0)
MCH: 27.9 pg (ref 26.0–34.0)
MCHC: 33 g/dL (ref 32.0–36.0)
MCV: 84.7 fL (ref 80.0–100.0)
PLATELETS: 253 10*3/uL (ref 150–440)
RBC: 4.08 MIL/uL (ref 3.80–5.20)
RDW: 15.1 % — AB (ref 11.5–14.5)
WBC: 6.9 10*3/uL (ref 3.6–11.0)

## 2016-11-24 LAB — BASIC METABOLIC PANEL
ANION GAP: 5 (ref 5–15)
BUN: 12 mg/dL (ref 6–20)
CALCIUM: 9.1 mg/dL (ref 8.9–10.3)
CO2: 23 mmol/L (ref 22–32)
Chloride: 110 mmol/L (ref 101–111)
Creatinine, Ser: 0.94 mg/dL (ref 0.44–1.00)
GFR calc Af Amer: >60 mL/min (ref >60)
Glucose, Bld: 117 mg/dL — ABNORMAL HIGH (ref 65–99)
POTASSIUM: 3.6 mmol/L (ref 3.5–5.1)
SODIUM: 138 mmol/L (ref 135–145)

## 2016-11-24 MED ORDER — OXYCODONE HCL 5 MG PO TABS: 5.0000 mg | ORAL_TABLET | ORAL | 0 refills | Status: DC | PRN

## 2016-11-24 NOTE — Evaluation (Addendum)
Occupational Therapy Evaluation Patient Details Name: Kristine DrossCathy A Rangel MRN: [id number] DOB: 1956-06-23 Today's Date: 11/24/2016    History of Present Illness Pt is a 61 y.o. female s/p 11/23/16 L shoulder arthroscopy with open rotator cuff repair, arthroscopic subacromial decompression and distal clavicle excision secondary to full thickness rotator cuff tear involving supraspinatus and infraspinatus, subacromial impingement and acromioclavicular arthritis.  PMH includes asthma, htn, h/o MI, CHF, CVA (R sided weakness), cerebral aneurysms with coils, CKD, DM, and neuropathy.   Clinical Impression   Pt. Is a 61 y.o. Female who [facility] for a Left Rotator Cuff Repair Surgery. Pt. Presents with BUE limitations, LUE immobilization, right shoulder ROM limitations, pain, weakness, and impaired functional mobility which hinder her ability to complete ADL tasks. Pt. could benefit from skilled OT services for ADL training, A/E training, UE there. Ex., ROM, ther. Activity, positioning, and pt. education about home modification, and DME. Pt. Tolerated initial ROM per conservative Protocol well.     Follow Up Recommendations  Home health OT    Equipment Recommendations       Recommendations for Other Services       Precautions / Restrictions Precautions Precautions: Shoulder (Left) Shoulder Interventions: Shoulder sling/immobilizer Restrictions Weight Bearing Restrictions: Yes LUE Weight Bearing: Non weight bearing                                                     ADL Overall ADL's : Needs assistance/impaired Eating/Feeding: Set up               Upper Body Dressing : Maximal assistance Pt. education was provided about brace position, and application. Pt. was also educated about one armed UE dressing techniques, and position for donning shirt. Pt. reports she prefers to wear shirt positioned over brace.    Lower Body Dressing: Maximal assistance                 General ADL Comments: Pt. education was provided about A/E use for LE ADLs.     Vision     Perception     Praxis      Pertinent Vitals/Pain Pain Assessment: 0-10 Pain Score: 10-Worst pain ever Pain Location: Left shoulder Pain Intervention(s): Limited activity within patient's tolerance;Repositioned;Monitored during session;Premedicated before session     Hand Dominance Right   Extremity/Trunk Assessment Upper Extremity Assessment Upper Extremity Assessment: LUE deficits/detail (RUE shoulder limitations. Pt. reports right shoulder needs surgery next.) LUE Deficits / Details: LUE ROM per MD for conservative protocol: No shoulder ROM, AROM for elbow, wrist, and digits. LUE: Unable to fully assess due to immobilization           Communication Communication Communication: No difficulties   Cognition Arousal/Alertness: Awake/alert Behavior During Therapy: WFL for tasks assessed/performed Overall Cognitive Status: Within Functional Limits for tasks assessed                     General Comments       Exercises       Shoulder Instructions      Home Living Family/patient expects to be discharged to:: Private residence Living Arrangements: Children Available Help at Discharge: Family;Personal care attendant Type of Home: House Home Access: Level entry     Home Layout: One level         Bathroom Toilet: Standard  Bathroom Accessibility: Yes   Home Equipment: Walker - 2 wheels;Cane - single point;Tub bench;Grab bars - tub/shower          Prior Functioning/Environment Level of Independence: Needs assistance    ADL's / Homemaking Assistance Needed: Personal care aide assist with morning care, cooking, and cleaning.            OT Problem List: Decreased range of motion;Pain;Impaired UE functional use;Decreased knowledge of use of DME or AE;Decreased activity tolerance   OT Treatment/Interventions: Self-care/ADL  training;Therapeutic exercise;DME and/or AE instruction;Patient/family education;Therapeutic activities    OT Goals(Current goals can be found in the care plan section) Acute Rehab OT Goals Patient Stated Goal: To return home, reduces pain, and increase independence OT Goal Formulation: With patient Potential to Achieve Goals: Good ADL Goals Pt Will Perform Eating: with modified independence Pt Will Perform Grooming: with modified independence Pt Will Perform Upper Body Dressing: with min assist Pt Will Perform Lower Body Dressing: with min assist Pt Will Transfer to Toilet: with min assist  OT Frequency: Min 1X/week   Barriers to D/C:            Co-evaluation              End of Session    Activity Tolerance: Patient tolerated treatment well Patient left: with call bell/phone within reach;in chair;with chair alarm set   Time: [phone], and [phone] An additional 15 min ADL training session prior to discharge OT Time Calculation (min): 34 min., and 15 min Total 49 min Charges:  OT General Charges $OT Visit: 1 Procedure OT Evaluation $OT Eval Moderate Complexity: 1 Procedure OT Treatments $Self Care/Home Management : 23-37 mins G-Codes: OT G-codes **NOT FOR INPATIENT CLASS** Functional Assessment Tool Used: Clinical judgement based on pt. current functional level Functional Limitation: Self care Self Care Current Status (Z3086): At least 20 percent but less than 40 percent impaired, limited or restricted Self Care Goal Status (V7846): At least 1 percent but less than 20 percent impaired, limited or restricted  Olegario Messier, MS, OTR/L 11/24/2016, 2:00 PM

## 2016-11-24 NOTE — Care Management Note (Signed)
Case Management Note  Patient Details  Name: KADEISHA BETSCH MRN: [id number] Date of Birth: 04-Jul-1956  Subjective/Objectiveent:  Case discussed with attending and he is agreeable that patient would benefit from PT and OT. Met with patient at bedside. Discussed home health. She is agreeable. Referral to [facility] for PT and OT.  PCP is Dr. Delight Stare at [facility]. She is not able to drive, home bound.                  Action/Plan: [facility] for PT and OT.   Expected Discharge Date:                  Expected Discharge Plan:  [place]  In-House Referral:     Discharge planning Services  CM Consult  Post Acute Care Choice:  Home Health Choice offered to:  Patient  DME Arranged:    DME Agency:     HH Arranged:  PT, OT HH Agency:  [facility] (now [facility])  Status of Service:  In process, will continue to follow  If discussed at Long Length of Stay Meetings, dates discussed:    Additional Comments:  Jolly Mango, RN 11/24/2016, 1:52 PM

## 2016-11-24 NOTE — Progress Notes (Signed)
  Subjective:  Postoperative day 1 status post left shoulder rotator cuff repair. Patient reports pain as moderate.    Objective:   VITALS:   Vitals:   11/23/16 2034 11/23/16 2100 11/24/16 0442 11/24/16 0816  BP: (!) 118/48 130/71 (!) 119/53 125/60  Pulse: 100  91 87  Resp: 18  19 20   Temp: 98.5 F (36.9 C)  98.2 F (36.8 C) 98.2 F (36.8 C)  TempSrc: Oral  Oral Oral  SpO2: 95%  96% 100%  Weight:      Height:        PHYSICAL EXAM:  Left upper extremity: Patient is an abduction sling. Her dressing is clean dry and intact. She is a TENS unit in place. She can flex and extend her fingers and has intact sensation to light touch in all 5 digits. She has a palpable radial pulse and her fingers well-perfused.   LABS  Results for orders placed or performed during the hospital encounter of 11/23/16 (from the past 24 hour(s))  CBC     Status: Abnormal   Collection Time: 11/24/16  5:46 AM  Result Value Ref Range   WBC 6.9 3.6 - 11.0 K/uL   RBC 4.08 3.80 - 5.20 MIL/uL   Hemoglobin 11.4 (L) 12.0 - 16.0 g/dL   HCT 14.734.5 (L) 82.935.0 - 56.247.0 %   MCV 84.7 80.0 - 100.0 fL   MCH 27.9 26.0 - 34.0 pg   MCHC 33.0 32.0 - 36.0 g/dL   RDW 13.015.1 (H) 86.511.5 - 78.414.5 %   Platelets 253 150 - 440 K/uL  Basic metabolic panel     Status: Abnormal   Collection Time: 11/24/16  5:46 AM  Result Value Ref Range   Sodium 138 135 - 145 mmol/L   Potassium 3.6 3.5 - 5.1 mmol/L   Chloride 110 101 - 111 mmol/L   CO2 23 22 - 32 mmol/L   Glucose, Bld 117 (H) 65 - 99 mg/dL   BUN 12 6 - 20 mg/dL   Creatinine, Ser 6.960.94 0.44 - 1.00 mg/dL   Calcium 9.1 8.9 - 29.510.3 mg/dL   GFR calc non Af Amer >60 >60 mL/min   GFR calc Af Amer >60 >60 mL/min   Anion gap 5 5 - 15    No results found.  Assessment/Plan: 1 Day Post-Op   Active Problems:   S/P left rotator cuff repair  Patient is making good progress postop. Her pain is improved. She'll be discharged home today with home health. She'll follow up with me in the office  in approximately 10 days for wound check and suture removal.    Juanell FairlyKRASINSKI, Tron Flythe , MD 11/24/2016, 1:49 PM

## 2016-11-24 NOTE — Evaluation (Signed)
Physical Therapy Evaluation Patient Details Name: Kristine DrossCathy A Rangel MRN: [id number] DOB: Sep 21, 1956 Today's Date: 11/24/2016   History of Present Illness  Pt is a 61 y.o. female s/p 11/23/16 L shoulder arthroscopy with open rotator cuff repair, arthroscopic subacromial decompression and distal clavicle excision secondary to full thickness rotator cuff tear involving supraspinatus and infraspinatus, subacromial impingement and acromioclavicular arthritis.  PMH includes asthma, htn, h/o MI, CHF, CVA (R sided weakness), cerebral aneurysms with coils, CKD, DM, and neuropathy.  Clinical Impression  Prior to hospital admission, pt [facility] (pt reports someone always walks with her for safety d/t balance issues).  Pt's son lives with pt and pt has caregiver assist (see below for details); per pt report she has 24/7 assist set-up upon discharge from hospital.  Currently pt is SBA to CGA with transfers and CGA with ambulation 60 feet with SPC; pt mildly unsteady with ambulation but no overt loss of balance noted requiring assist to steady.  Pt would benefit from skilled PT to address noted impairments and functional limitations.  Recommend pt discharge to home with 24/7 assist for safety with functional mobility/ambulation, use of SPC, and HHPT when medically appropriate.    Follow Up Recommendations Home health PT;Supervision/Assistance - 24 hour    Equipment Recommendations  Cane (Pt reports already owning [facility])    Recommendations for Other Services       Precautions / Restrictions Precautions Precautions: Shoulder Shoulder Interventions: Shoulder sling/immobilizer;At all times Restrictions Weight Bearing Restrictions: Yes LUE Weight Bearing: Non weight bearing      Mobility  Bed Mobility Overal bed mobility: Needs Assistance Bed Mobility: Sit to Supine       Sit to supine: Supervision   General bed mobility comments: HOB elevated per set-up at home (pt has wedge she uses and  pillows); SBA for safety  Transfers Overall transfer level: Needs assistance Equipment used: None Transfers: Sit to/from BJ'sStand;Stand Pivot Transfers Sit to Stand: Supervision Stand pivot transfers: Min guard (to toilet)       General transfer comment: strong stand from recliner and commode; steady without loss of balance  Ambulation/Gait Ambulation/Gait assistance: Min guard Ambulation Distance (Feet): 60 Feet Assistive device: Straight cane   Gait velocity: decreased   General Gait Details: mild decreased stance time R LE; decreased R step length; mildly unsteady but no overt loss of balance noted requiring assist to steady  Stairs            Wheelchair Mobility    Modified Rankin (Stroke Patients Only)       Balance Overall balance assessment: Needs assistance Sitting-balance support: No upper extremity supported;Feet supported Sitting balance-Leahy Scale: Good     Standing balance support: No upper extremity supported Standing balance-Leahy Scale: Good Standing balance comment: doffing/donning underwear for toileting with R UE steady without loss of balance                             Pertinent Vitals/Pain Pain Assessment: 0-10 Pain Score: 10-Worst pain ever Pain Location: Left shoulder Pain Descriptors / Indicators: Sore;Tender;Sharp;Shooting Pain Intervention(s): Limited activity within patient's tolerance;Repositioned;Monitored during session;Premedicated before session  Vitals (HR and O2 on room air) stable and WFL throughout treatment session.    Home Living Family/patient expects to be discharged to:: Private residence Living Arrangements: Children (Pt's son stays with pt) Available Help at Discharge: Family;Personal care attendant Type of Home: House Home Access: Level entry     Home Layout:  One level Home Equipment: Walker - 2 wheels;Cane - single point;Tub bench;Grab bars - tub/shower      Prior Function Level of Independence:  Needs assistance   Gait / Transfers Assistance Needed: Pt ambulates with SPC and always has someone with her when ambulating for safety d/t h/o balance issues.  ADL's / Homemaking Assistance Needed: Personal care aide assists with morning care, cooking, bathing, and cleaning (8 am to noon and 12:30-3:30 pm M-F; also comes every other Sunday); pt's son and family helps otherwise  Comments: Pt denies any falls in past 6 months.  Pt reports having 24/7 assist set-up for discharge home already.     Hand Dominance   Dominant Hand: Right    Extremity/Trunk Assessment   Upper Extremity Assessment Upper Extremity Assessment: Defer to OT evaluation LUE Deficits / Details: LUE ROM per MD for conservative protocol (see OT consult): No shoulder ROM; AROM for elbow, wrist, and digits. LUE: Unable to fully assess due to immobilization    Lower Extremity Assessment Lower Extremity Assessment: RLE deficits/detail;LLE deficits/detail RLE Deficits / Details: at least 3+/5 hip flexion, knee flexion/extension, and DF LLE Deficits / Details: ROM and strength WFL       Communication   Communication: No difficulties  Cognition Arousal/Alertness: Awake/alert Behavior During Therapy: WFL for tasks assessed/performed Overall Cognitive Status: Within Functional Limits for tasks assessed                      General Comments General comments (skin integrity, edema, etc.): Pt sitting up in chair upon PT arrival.  Nursing cleared pt for participation in physical therapy.  Pt agreeable to PT session.     Exercises     Assessment/Plan    PT Assessment Patient needs continued PT services  PT Problem List Decreased activity tolerance;Decreased balance;Decreased mobility;Pain          PT Treatment Interventions DME instruction;Gait training;Functional mobility training;Therapeutic activities;Therapeutic exercise;Balance training;Patient/family education    PT Goals (Current goals can be  found in the Care Plan section)  Acute Rehab PT Goals Patient Stated Goal: to return home and have less pain PT Goal Formulation: With patient Time For Goal Achievement: 12/08/16 Potential to Achieve Goals: Fair    Frequency BID   Barriers to discharge        Co-evaluation               End of Session Equipment Utilized During Treatment: Gait belt;Other (comment) (L UE shoulder sling/immobilizer) Activity Tolerance: Patient limited by pain (in L shoulder) Patient left: in bed;with call bell/phone within reach;with bed alarm set Nurse Communication: Mobility status;Patient requests pain meds;Precautions;Weight bearing status         Time: 1610-9604 PT Time Calculation (min) (ACUTE ONLY): 33 min   Charges:   PT Evaluation $PT Eval Low Complexity: 1 Procedure PT Treatments $Therapeutic Activity: 8-22 mins   PT G Codes:   PT G-Codes **NOT FOR INPATIENT CLASS** Functional Assessment Tool Used: AM-PAC without stairs Functional Limitation: Mobility: Walking and moving around Mobility: Walking and Moving Around Current Status (V4098): At least 40 percent but less than 60 percent impaired, limited or restricted Mobility: Walking and Moving Around Goal Status [phone]): At least 1 percent but less than 20 percent impaired, limited or restricted    Hendricks Limes 11/24/2016, 2:12 PM Hendricks Limes, PT [phone]

## 2016-11-24 NOTE — Progress Notes (Signed)
Kristine Rangel to be D/C'd Home per MD order.  Discussed with the patient and all questions fully answered.  VSS, Skin clean, dry and intact without evidence of skin break down, no evidence of skin tears noted. IV catheter discontinued intact. Site without signs and symptoms of complications. Dressing and pressure applied.  An After Visit Summary was printed and given to the patient. Patient received prescription.  D/c education completed with patient/family including follow up instructions, medication list, d/c activities limitations if indicated, with other d/c instructions as indicated by MD - patient able to verbalize understanding, all questions fully answered.   Patient instructed to return to ED, call 911, or call MD for any changes in condition.   Patient escorted via WC, and D/C home via private auto.  Harvie HeckMelanie Anel Creighton 11/24/2016 5:24 PM

## 2016-11-24 NOTE — Progress Notes (Signed)
Physical Therapy Treatment Patient Details Name: Kristine Rangel MRN: [id number] DOB: [birth year] Today's Date: 11/24/2016    History of Present Illness Pt is a 61 y.o. female s/p 11/23/16 L shoulder arthroscopy with open rotator cuff repair, arthroscopic subacromial decompression and distal clavicle excision secondary to full thickness rotator cuff tear involving supraspinatus and infraspinatus, subacromial impingement and acromioclavicular arthritis.  PMH includes asthma, htn, h/o MI, CHF, CVA (R sided weakness), cerebral aneurysms with coils, CKD, DM, and neuropathy.    PT Comments    Pt able to progress to ambulating 120 feet with SPC CGA; pt with decreased cadence but no loss of balance noted with ambulation.  Pt reports having 24/7 assist set-up at home.  Recommend discharge home with 24/7 assist for functional mobility/ambulation, use of SPC, and HHPT.   Follow Up Recommendations  Home health PT;Supervision/Assistance - 24 hour     Equipment Recommendations  Cane (Pt reports already owning [facility])    Recommendations for Other Services       Precautions / Restrictions Precautions Precautions: Shoulder Shoulder Interventions: Shoulder sling/immobilizer;At all times Restrictions Weight Bearing Restrictions: Yes LUE Weight Bearing: Non weight bearing    Mobility  Bed Mobility  Transfers Overall transfer level: Needs assistance Equipment used: None Transfers: Sit to/from Stand;Stand Pivot Transfers Sit to Stand: Supervision Stand pivot transfers: Supervision (to bedside commode)       General transfer comment: steady standing without loss of balance  Ambulation/Gait Ambulation/Gait assistance: Min guard Ambulation Distance (Feet): 120 Feet Assistive device: Straight cane   Gait velocity: decreased   General Gait Details: mild decreased stance time R LE; decreased R step length; improved steadiness noted from morning session   Stairs            Wheelchair  Mobility    Modified Rankin (Stroke Patients Only)       Balance Overall balance assessment: Needs assistance Sitting-balance support: No upper extremity supported;Feet supported Sitting balance-Leahy Scale: Good     Standing balance support: No upper extremity supported Standing balance-Leahy Scale: Good Standing balance comment: doffing/donning underwear for toileting with R UE steady without loss of balance                    Cognition Arousal/Alertness: Awake/alert Behavior During Therapy: WFL for tasks assessed/performed Overall Cognitive Status: Within Functional Limits for tasks assessed                      Exercises      General Comments General comments (skin integrity, edema, etc.): Pt sitting up in chair upon PT arrival.  Nursing cleared pt for participation in physical therapy.  Pt agreeable to PT session.      Pertinent Vitals/Pain Pain Assessment: 0-10 Pain Score: 6  Pain Location: Left shoulder Pain Descriptors / Indicators: Sore;Tender;Sharp;Shooting Pain Intervention(s): Limited activity within patient's tolerance;Monitored during session;Premedicated before session;Ice applied  Vitals (HR and O2 on room air) stable and WFL throughout treatment session.    Home Living Family/patient expects to be discharged to:: Private residence Living Arrangements: Children (Pt's son stays with pt) Available Help at Discharge: Family;Personal care attendant Type of Home: House Home Access: Level entry   Home Layout: One level Home Equipment: Walker - 2 wheels;Cane - single point;Tub bench;Grab bars - tub/shower      Prior Function Level of Independence: Needs assistance  Gait / Transfers Assistance Needed: Pt ambulates with SPC and always has someone with her when ambulating for safety d/t h/o  balance issues. ADL's / Homemaking Assistance Needed: Personal care aide assists with morning care, cooking, bathing, and cleaning (8 am to noon and  12:30-3:30 pm M-F; also comes every other Sunday); pt's son and family helps otherwise Comments: Pt denies any falls in past 6 months.  Pt reports having 24/7 assist set-up for discharge home already.   PT Goals (current goals can now be found in the care plan section) Acute Rehab PT Goals Patient Stated Goal: to return home and have less pain PT Goal Formulation: With patient Time For Goal Achievement: 12/08/16 Potential to Achieve Goals: Fair Progress towards PT goals: Progressing toward goals    Frequency    BID      PT Plan Current plan remains appropriate    Co-evaluation             End of Session Equipment Utilized During Treatment: Gait belt;Other (comment) (L UE shoulder sling/immobilizer) Activity Tolerance: Patient tolerated treatment well Patient left:  (sitting on edge of bed with OT present)     Time: 1440-1503 PT Time Calculation (min) (ACUTE ONLY): 23 min  Charges:  $Gait Training: 8-22 mins $Therapeutic Activity: 8-22 mins                    G Codes:  Functional Assessment Tool Used: AM-PAC without stairs Functional Limitation: Mobility: Walking and moving around Mobility: Walking and Moving Around Current Status ([id number](G8978): At least 40 percent but less than 60 percent impaired, limited or restricted Mobility: Walking and Moving Around Goal Status [phone](G8979): At least 1 percent but less than 20 percent impaired, limited or restricted   Hendricks Limesmily Gurjit Loconte 11/24/2016, 5:17 PM Hendricks LimesEmily Chrisma Hurlock, PT [phone]

## 2016-11-24 NOTE — Progress Notes (Signed)
Patient complaining of 10 out of 10 pain after receiving muscle relaxer and pain medication. When RN rounds patient is usually asleep. Polar care has been readjusted, patient repositioned. Surgical site looks clean and dry. Patient got up to chair with RN and tech this morning but is now back to bed. Harvie Heck.  Javeah Loeza, RN

## 2016-11-24 NOTE — Progress Notes (Signed)
  Subjective:  Patient is up out of bed to a chair. Patient reports pain as mild to moderate.    Objective:   VITALS:   Vitals:   11/23/16 2100 11/24/16 0442 11/24/16 0816 11/24/16 1352  BP: 130/71 (!) 119/53 125/60 107/66  Pulse:  91 87 81  Resp:  19 20 18   Temp:  98.2 F (36.8 C) 98.2 F (36.8 C) 97.9 F (36.6 C)  TempSrc:  Oral Oral   SpO2:  96% 100% 98%  Weight:      Height:        PHYSICAL EXAM:  Left upper extremity extremity is in an abduction sling. Dressing remains clean and dry. She is nervous intact.   LABS  Results for orders placed or performed during the hospital encounter of 11/23/16 (from the past 24 hour(s))  CBC     Status: Abnormal   Collection Time: 11/24/16  5:46 AM  Result Value Ref Range   WBC 6.9 3.6 - 11.0 K/uL   RBC 4.08 3.80 - 5.20 MIL/uL   Hemoglobin 11.4 (L) 12.0 - 16.0 g/dL   HCT 96.034.5 (L) 45.435.0 - 09.847.0 %   MCV 84.7 80.0 - 100.0 fL   MCH 27.9 26.0 - 34.0 pg   MCHC 33.0 32.0 - 36.0 g/dL   RDW 11.915.1 (H) 14.711.5 - 82.914.5 %   Platelets 253 150 - 440 K/uL  Basic metabolic panel     Status: Abnormal   Collection Time: 11/24/16  5:46 AM  Result Value Ref Range   Sodium 138 135 - 145 mmol/L   Potassium 3.6 3.5 - 5.1 mmol/L   Chloride 110 101 - 111 mmol/L   CO2 23 22 - 32 mmol/L   Glucose, Bld 117 (H) 65 - 99 mg/dL   BUN 12 6 - 20 mg/dL   Creatinine, Ser 5.620.94 0.44 - 1.00 mg/dL   Calcium 9.1 8.9 - 13.010.3 mg/dL   GFR calc non Af Amer >60 >60 mL/min   GFR calc Af Amer >60 >60 mL/min   Anion gap 5 5 - 15    No results found.  Assessment/Plan: 1 Day Post-Op   Active Problems:   S/P left rotator cuff repair  Patient is going to be discharged home this evening with home health. She'll follow up with me in the office in 7-10 days. She is to wear the abduction sling at all times. Her bandage remained be removed in 3 days. She may remove the sling after bandage comes off to shower. Otherwise she remains in the sling at all times.    Juanell FairlyKRASINSKI,  Kayan Blissett , MD 11/24/2016, 5:02 PM

## 2016-11-24 NOTE — Progress Notes (Signed)
PT is recommending home health. RN case manager aware of above. Please reconsult if future social work needs arise. CSW signing off.   Alliana Mcauliff, LCSW [phone]  

## 2016-11-24 NOTE — Discharge Summary (Signed)
Physician Discharge Summary  Patient ID: Kristine DrossCathy A Parillo MRN: [id number] DOB/AGE: Dec 16, 1955 61 y.o.  Admit date: 11/23/2016 Discharge date: 11/24/2016  Admission Diagnoses:  Left shoulder rotator cuff tear status post surgical fixation  Discharge Diagnoses:  Left shoulder rotator cuff tear Active Problems:   S/P left rotator cuff repair   Past Medical History:  Diagnosis Date  . Arthritis   . Asthma   . Bladder leak   . Cerebral aneurysm    history-has coils  . CHF (congestive heart failure) (HCC)   . Chronic kidney disease   . Diabetes mellitus without complication (HCC)   . Dyspnea   . GERD (gastroesophageal reflux disease)   . Glaucoma   . Headache   . Hyperlipidemia   . Hypertension   . MI (myocardial infarction)   . Neuropathy (HCC)   . Rotator cuff injury   . Seasonal allergies   . Stroke [facility](HCC)    TIA's    Surgeries: Procedure(s): SHOULDER ARTHROSCOPY WITH OPEN ROTATOR CUFF REPAIR, ARTHROSCOPIC SUBACROMIAL DECOMPRESSION, DISTAL CLAVICLE EXCISION on 11/23/2016   Consultants (if any):   Discharged Condition: Improved  Hospital Course: Kristine DrossCathy A Lewers is an 61 y.o. female who was admitted 11/23/2016 with a diagnosis of  The shoulder rotator cuff tear and went to the operating room on 11/23/2016 and underwent an uncomplicated surgical repair.  Patient was admitted for postop pain control.  She was given perioperative antibiotics:  Anti-infectives    Start     Dose/Rate Route Frequency Ordered Stop   11/23/16 1700  clindamycin (CLEOCIN) IVPB 900 mg     900 mg 100 mL/hr over 30 Minutes Intravenous Every 6 hours 11/23/16 1521 11/24/16 0527   11/23/16 0555  clindamycin (CLEOCIN) 900 MG/50ML IVPB    Comments:  SHARPE, EVE: cabinet override      11/23/16 0555 11/23/16 0758   11/22/16 2300  clindamycin (CLEOCIN) IVPB 900 mg     900 mg 100 mL/hr over 30 Minutes Intravenous  Once 11/22/16 2248 11/23/16 0758    .  She was given sequential compression devices, early  ambulation for DVT prophylaxis.  She benefited maximally from the hospital stay and there were no complications.    Recent vital signs:  Vitals:   11/24/16 0816 11/24/16 1352  BP: 125/60 107/66  Pulse: 87 81  Resp: 20 18  Temp: 98.2 F (36.8 C) 97.9 F (36.6 C)    Recent laboratory studies:  Lab Results  Component Value Date   HGB 11.4 (L) 11/24/2016   HGB 13.4 11/16/2016   HGB 12.2 04/08/2016   Lab Results  Component Value Date   WBC 6.9 11/24/2016   PLT 253 11/24/2016   Lab Results  Component Value Date   INR 1.01 11/16/2016   Lab Results  Component Value Date   NA 138 11/24/2016   K 3.6 11/24/2016   CL 110 11/24/2016   CO2 23 11/24/2016   BUN 12 11/24/2016   CREATININE 0.94 11/24/2016   GLUCOSE 117 (H) 11/24/2016    Discharge Medications:   Allergies as of 11/24/2016      Reactions   Bee Venom Anaphylaxis   Penicillins Hives, Swelling   Has patient had a PCN reaction causing immediate rash, facial/tongue/throat swelling, SOB or lightheadedness with hypotension: Yes Has patient had a PCN reaction causing severe rash involving mucus membranes or skin necrosis: No Has patient had a PCN reaction that required hospitalization No Has patient had a PCN reaction occurring within the last 10 years: No  If all of the above answers are "NO", then may proceed with Cephalosporin use.      Medication List    STOP taking these medications   traMADol 50 MG tablet Commonly known as:  ULTRAM     TAKE these medications   albuterol (2.5 MG/3ML) 0.083% nebulizer solution Commonly known as:  PROVENTIL Take 2.5 mg by nebulization every 6 (six) hours as needed. For wheezing.   aspirin 81 MG chewable tablet Chew 81 mg by mouth at bedtime.   atorvastatin 40 MG tablet Commonly known as:  LIPITOR Take 40 mg by mouth at bedtime.   cetirizine 10 MG tablet Commonly known as:  ZYRTEC Take 10 mg by mouth daily.   COMBIGAN 0.2-0.5 % ophthalmic solution Generic drug:   brimonidine-timolol Place 1 drop into both eyes 2 (two) times daily.   DULoxetine 60 MG capsule Commonly known as:  CYMBALTA Take 60 mg by mouth at bedtime.   EPINEPHrine 0.3 mg/0.3 mL Soaj injection Commonly known as:  EPI-PEN Inject 0.3 mg into the skin once as needed.   esomeprazole 40 MG capsule Commonly known as:  NEXIUM Take 40 mg by mouth daily.   fluticasone 50 MCG/ACT nasal spray Commonly known as:  FLONASE Place 1 spray into both nostrils daily as needed for allergies or rhinitis.   furosemide 40 MG tablet Commonly known as:  LASIX Take 40 mg by mouth 2 (two) times daily with a meal.   latanoprost 0.005 % ophthalmic solution Commonly known as:  XALATAN Place 1 drop into both eyes at bedtime.   meloxicam 15 MG tablet Commonly known as:  MOBIC Take 1 tablet (15 mg total) by mouth daily.   oxyCODONE 5 MG immediate release tablet Commonly known as:  Oxy IR/ROXICODONE Take 1-2 tablets (5-10 mg total) by mouth every 4 (four) hours as needed for breakthrough pain.   potassium chloride 10 MEQ tablet Commonly known as:  K-DUR Take 10 mEq by mouth daily.   SYMBICORT 160-4.5 MCG/ACT inhaler Generic drug:  budesonide-formoterol Inhale 2 puffs into the lungs 2 (two) times daily.   tiZANidine 4 MG tablet Commonly known as:  ZANAFLEX Take 4 mg by mouth every 6 (six) hours as needed for muscle spasms.   topiramate 100 MG tablet Commonly known as:  TOPAMAX Take 100 mg by mouth at bedtime.   VESICARE 10 MG tablet Generic drug:  solifenacin Take 10 mg by mouth daily.       Diagnostic Studies: No results found.  Disposition: 01-Home or Self Care  Discharge Instructions    Call MD / Call 911    Complete by:  As directed    If you experience chest pain or shortness of breath, CALL 911 and be transported to the hospital emergency room.  If you develope a fever above 101 F, pus (white drainage) or increased drainage or redness at the wound, or calf pain, call your  surgeon's office.   Constipation Prevention    Complete by:  As directed    Drink plenty of fluids.  Prune juice may be helpful.  You may use a stool softener, such as Colace (over the counter) 100 mg twice a day.  Use MiraLax (over the counter) for constipation as needed.   Diet - low sodium heart healthy    Complete by:  As directed    Discharge instructions    Complete by:  As directed    Wear sling at all times, including sleep.  You will need to use  the sling for a total of 4 weeks following surgery.  Do not try and lift your arm away from your body for any reason.   Keep the dressing dry.  You may remove bandage in 3 days.  Leave the Steri-Strips (white medical tape) in place.  You may place additional Band-Aids over top of the Steri-Strips if you wish.  May shower once dressing is removed in 3 days.  Remove sling carefully only for showers, leaving arm down by your side while in the shower.  Make sure to take some pain medication this evening before you fall asleep, in preparation for the nerve block wearing off in the middle of the night.  If the the pain medication causes itching, or is too strong, try taking a single tablet at a time, or combining with Benadryl.  You may be most comfortable sleeping in a recliner.  If you do sleep in near bed, placed pillows behind the shoulder that have the operation to support it.   Driving restrictions    Complete by:  As directed    No driving for 4 weeks   Increase activity slowly as tolerated    Complete by:  As directed    Lifting restrictions    Complete by:  As directed    No lifting for 8-12 weeks         Signed: Juanell Fairly ,MD 11/24/2016, 5:04 PM

## 2016-12-07 ENCOUNTER — Encounter: Payer: Self-pay | Admitting: Orthopedic Surgery

## 2017-01-08 ENCOUNTER — Other Ambulatory Visit: Payer: Self-pay | Admitting: Family Medicine

## 2017-01-08 DIAGNOSIS — Z1231 Encounter for screening mammogram for malignant neoplasm of breast: Secondary | ICD-10-CM

## 2017-01-12 ENCOUNTER — Encounter: Payer: Self-pay | Admitting: Emergency Medicine

## 2017-01-12 ENCOUNTER — Emergency Department: Payer: Medicare Other

## 2017-01-12 ENCOUNTER — Emergency Department
Admission: EM | Admit: 2017-01-12 | Discharge: 2017-01-12 | Disposition: A | Discharge status: Home / Self Care | Payer: Medicare Other | Attending: Emergency Medicine | Admitting: Emergency Medicine

## 2017-01-12 DIAGNOSIS — Y999 Unspecified external cause status: Secondary | ICD-10-CM | POA: Diagnosis not present

## 2017-01-12 DIAGNOSIS — J45909 Unspecified asthma, uncomplicated: Secondary | ICD-10-CM | POA: Diagnosis not present

## 2017-01-12 DIAGNOSIS — Z79899 Other long term (current) drug therapy: Secondary | ICD-10-CM | POA: Diagnosis not present

## 2017-01-12 DIAGNOSIS — N189 Chronic kidney disease, unspecified: Secondary | ICD-10-CM | POA: Diagnosis not present

## 2017-01-12 DIAGNOSIS — Z7982 Long term (current) use of aspirin: Secondary | ICD-10-CM | POA: Insufficient documentation

## 2017-01-12 DIAGNOSIS — Y9289 Other specified places as the place of occurrence of the external cause: Secondary | ICD-10-CM | POA: Insufficient documentation

## 2017-01-12 DIAGNOSIS — I130 Hypertensive heart and chronic kidney disease with heart failure and stage 1 through stage 4 chronic kidney disease, or unspecified chronic kidney disease: Secondary | ICD-10-CM | POA: Insufficient documentation

## 2017-01-12 DIAGNOSIS — M25512 Pain in left shoulder: Secondary | ICD-10-CM | POA: Insufficient documentation

## 2017-01-12 DIAGNOSIS — Y9301 Activity, walking, marching and hiking: Secondary | ICD-10-CM | POA: Insufficient documentation

## 2017-01-12 DIAGNOSIS — F17290 Nicotine dependence, other tobacco product, uncomplicated: Secondary | ICD-10-CM | POA: Insufficient documentation

## 2017-01-12 DIAGNOSIS — I509 Heart failure, unspecified: Secondary | ICD-10-CM | POA: Insufficient documentation

## 2017-01-12 DIAGNOSIS — M25561 Pain in right knee: Secondary | ICD-10-CM | POA: Insufficient documentation

## 2017-01-12 DIAGNOSIS — E1122 Type 2 diabetes mellitus with diabetic chronic kidney disease: Secondary | ICD-10-CM | POA: Diagnosis not present

## 2017-01-12 DIAGNOSIS — S8991XA Unspecified injury of right lower leg, initial encounter: Secondary | ICD-10-CM | POA: Diagnosis present

## 2017-01-12 MED ORDER — OXYCODONE HCL 5 MG PO TABS: 5.0000 mg | ORAL_TABLET | Freq: Four times a day (QID) | ORAL | 0 refills | Status: DC | PRN

## 2017-01-12 NOTE — ED Notes (Addendum)
See triage note [facility]  Having pain left shoulder and right knee
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
## 2017-01-12 NOTE — ED Triage Notes (Signed)
Pt was at [employer] and someone in a power wheelchair was turning around and the cart wouldn't stop and pinned her right ankle/leg per pt.  She fell with incident. Recently had left shoulder surgery. Able to walk to front of store to get to chair.  Appears to some swelling to right thigh. Some pain to left arm.

## 2017-02-02 ENCOUNTER — Ambulatory Visit: Payer: Medicare Other | Attending: Family Medicine

## 2017-03-28 ENCOUNTER — Ambulatory Visit: Payer: No Typology Code available for payment source | Attending: Family Medicine

## 2017-06-05 ENCOUNTER — Other Ambulatory Visit: Payer: Self-pay | Admitting: Orthopedic Surgery

## 2017-06-05 ENCOUNTER — Encounter
Admission: RE | Admit: 2017-06-05 | Discharge: 2017-06-05 | Disposition: A | Discharge status: Home / Self Care | Payer: Medicare Other | Source: Ambulatory Visit | Attending: Orthopedic Surgery | Admitting: Orthopedic Surgery

## 2017-06-05 DIAGNOSIS — Z01818 Encounter for other preprocedural examination: Secondary | ICD-10-CM | POA: Diagnosis not present

## 2017-06-05 DIAGNOSIS — Z01812 Encounter for preprocedural laboratory examination: Secondary | ICD-10-CM | POA: Diagnosis not present

## 2017-06-05 DIAGNOSIS — G5601 Carpal tunnel syndrome, right upper limb: Secondary | ICD-10-CM | POA: Insufficient documentation

## 2017-06-05 HISTORY — DX: Personal history of urinary calculi: Z87.442

## 2017-06-05 LAB — CBC WITH DIFFERENTIAL/PLATELET
BASOS ABS: 0 10*3/uL (ref 0–0.1)
Basophils Relative: 1 %
Eosinophils Absolute: 0.3 10*3/uL (ref 0–0.7)
Eosinophils Relative: 7 %
HEMATOCRIT: 39.8 % (ref 35.0–47.0)
HEMOGLOBIN: 12.8 g/dL (ref 12.0–16.0)
LYMPHS ABS: 1.5 10*3/uL (ref 1.0–3.6)
LYMPHS PCT: 38 %
MCH: 27.6 pg (ref 26.0–34.0)
MCHC: 32.2 g/dL (ref 32.0–36.0)
MCV: 85.7 fL (ref 80.0–100.0)
Monocytes Absolute: 0.5 10*3/uL (ref 0.2–0.9)
Monocytes Relative: 13 %
NEUTROS ABS: 1.5 10*3/uL (ref 1.4–6.5)
Neutrophils Relative %: 41 %
Platelets: 295 10*3/uL (ref 150–440)
RBC: 4.65 MIL/uL (ref 3.80–5.20)
RDW: 15.4 % — ABNORMAL HIGH (ref 11.5–14.5)
WBC: 3.8 10*3/uL (ref 3.6–11.0)

## 2017-06-05 LAB — PROTIME-INR
INR: 1.03
Prothrombin Time: 13.5 seconds (ref 11.4–15.2)

## 2017-06-05 LAB — BASIC METABOLIC PANEL
ANION GAP: 8 (ref 5–15)
BUN: 23 mg/dL — ABNORMAL HIGH (ref 6–20)
CO2: 22 mmol/L (ref 22–32)
Calcium: 10 mg/dL (ref 8.9–10.3)
Chloride: 107 mmol/L (ref 101–111)
Creatinine, Ser: 1.11 mg/dL — ABNORMAL HIGH (ref 0.44–1.00)
GFR calc Af Amer: 60 mL/min — ABNORMAL LOW (ref >60)
GFR, EST NON AFRICAN AMERICAN: 52 mL/min — AB (ref >60)
GLUCOSE: 80 mg/dL (ref 65–99)
POTASSIUM: 4.1 mmol/L (ref 3.5–5.1)
SODIUM: 137 mmol/L (ref 135–145)

## 2017-06-05 LAB — APTT: APTT: 33 s (ref 24–36)

## 2017-06-05 NOTE — Patient Instructions (Signed)
Your procedure is scheduled on: June 12, 2017 [facility]) Report to Same Day Surgery 2nd floor medical mall (Medical Mall Entrance-take elevator on left to 2nd floor.  Check in with surgery information desk.) To find out your arrival time please call [phone] between 1PM - 3PM on June 11, 2017 (MONDAY) Remember: Instructions that are not followed completely may result in serious medical risk, up to and including death, or upon the discretion of your surgeon and anesthesiologist your surgery may need to be rescheduled.    _x___ 1. Do not eat food or drink liquids after midnight. No gum chewing or hard candies                            __x__ 2. No Alcohol for 24 hours before or after surgery.   __x__3. No Smoking for 24 prior to surgery.   ____  4. Bring all medications with you on the day of surgery if instructed.    __x__ 5. Notify your doctor if there is any change in your medical condition     (cold, fever, infections).     Do not wear jewelry, make-up, hairpins, clips or nail polish.  Do not wear lotions, powders, or perfumes. You may wear deodorant.  Do not shave 48 hours prior to surgery. Men may shave face and neck.  Do not bring valuables to the hospital.    [facility] is not responsible for any belongings or valuables.               Contacts, dentures or bridgework may not be worn into surgery.  Leave your suitcase in the car. After surgery it may be brought to your room.  For patients admitted to the hospital, discharge time is determined by your treatment team                      Patients discharged the day of surgery will not be allowed to drive home.  You will need someone to drive you home and stay with you the night of your procedure.    Please read over the following fact sheets that you were given:   [facility] Preparing for Surgery and or MRSA Information   TAKE THE FOLLOWING MEDICATION THE MORNING OF SURGERY WITH A SIP OF WATER :  1. NEXIUM ( NEXIUM AT  BEDTIME ON JULY 30 )    ____Fleets enema or Magnesium Citrate as directed.   _x___ Use CHG Soap or sage wipes as directed on instruction sheet   _X___ Use inhalers on the day of surgery and bring to hospital day of surgery (USE ALBUTEROL NEBULIZER  AND SYMBICORT INHALER THE MORNING OF SURGERY AND BRING SYMBICORT INHALER WITH YOU TO HOSPITAL ) ____ Stop Metformin and Janumet 2 days prior to surgery.    ____ Take 1/2 of usual insulin dose the night before surgery and none on the morning surgery      _x___ Follow recommendations from Cardiologist, Pulmonologist or PCP regarding          stopping Aspirin, Coumadin, Plavix ,Eliquis, Effient, or Pradaxa, and Pletal. (STOP ASPIRIN TODAY )  X____Stop Anti-inflammatories such as Advil, Aleve, Ibuprofen, Motrin, Naproxen, Naprosyn, Goodies powders or aspirin products. ( STOP MELOXICAM TODAY ) OK to take Tylenol                            _x___ Stop  supplements until after surgery.  But may continue Vitamin D, Vitamin B,  and multivitamin     ____ Bring C-Pap to the hospital.

## 2017-06-11 MED ORDER — CLINDAMYCIN PHOSPHATE 900 MG/50ML IV SOLN
900.0000 mg | Freq: Once | INTRAVENOUS | Status: AC
  Administered 2017-06-12: 900 mg via INTRAVENOUS

## 2017-06-11 NOTE — Pre-Procedure Instructions (Signed)
Medical clearance received from Dr. Darreld McleanLinda Rangel stating medical risk for surgical procedure is low risk and clearance placed on chart.

## 2017-06-12 ENCOUNTER — Encounter: Payer: Self-pay | Admitting: Anesthesiology

## 2017-06-12 ENCOUNTER — Encounter
Admission: RE | Disposition: A | Discharge status: Home / Self Care | Payer: Self-pay | Source: Ambulatory Visit | Attending: Orthopedic Surgery

## 2017-06-12 ENCOUNTER — Ambulatory Visit: Payer: Medicare Other | Admitting: Anesthesiology

## 2017-06-12 ENCOUNTER — Ambulatory Visit
Admission: RE | Admit: 2017-06-12 | Discharge: 2017-06-12 | Disposition: A | Discharge status: Home / Self Care | Payer: Medicare Other | Source: Ambulatory Visit | Attending: Orthopedic Surgery | Admitting: Orthopedic Surgery

## 2017-06-12 DIAGNOSIS — I110 Hypertensive heart disease with heart failure: Secondary | ICD-10-CM | POA: Diagnosis not present

## 2017-06-12 DIAGNOSIS — I252 Old myocardial infarction: Secondary | ICD-10-CM | POA: Insufficient documentation

## 2017-06-12 DIAGNOSIS — Z6841 Body Mass Index (BMI) 40.0 and over, adult: Secondary | ICD-10-CM | POA: Diagnosis not present

## 2017-06-12 DIAGNOSIS — E119 Type 2 diabetes mellitus without complications: Secondary | ICD-10-CM | POA: Diagnosis not present

## 2017-06-12 DIAGNOSIS — Z79899 Other long term (current) drug therapy: Secondary | ICD-10-CM | POA: Insufficient documentation

## 2017-06-12 DIAGNOSIS — I509 Heart failure, unspecified: Secondary | ICD-10-CM | POA: Diagnosis not present

## 2017-06-12 DIAGNOSIS — G5601 Carpal tunnel syndrome, right upper limb: Secondary | ICD-10-CM | POA: Diagnosis not present

## 2017-06-12 DIAGNOSIS — K219 Gastro-esophageal reflux disease without esophagitis: Secondary | ICD-10-CM | POA: Insufficient documentation

## 2017-06-12 DIAGNOSIS — Z87891 Personal history of nicotine dependence: Secondary | ICD-10-CM | POA: Diagnosis not present

## 2017-06-12 HISTORY — PX: CARPAL TUNNEL RELEASE: SHX101

## 2017-06-12 LAB — GLUCOSE, CAPILLARY: Glucose-Capillary: 101 mg/dL — ABNORMAL HIGH (ref 65–99)

## 2017-06-12 SURGERY — CARPAL TUNNEL RELEASE
Anesthesia: General | Laterality: Right

## 2017-06-12 MED ORDER — PROPOFOL 10 MG/ML IV BOLUS
INTRAVENOUS | Status: DC | PRN
  Administered 2017-06-12 (×3): 50 mg via INTRAVENOUS
  Administered 2017-06-12: 150 mg via INTRAVENOUS

## 2017-06-12 MED ORDER — NEOMYCIN-POLYMYXIN B GU 40-200000 IR SOLN
Status: DC | PRN
  Administered 2017-06-12: 2 mL

## 2017-06-12 MED ORDER — BUPIVACAINE HCL (PF) 0.25 % IJ SOLN
INTRAMUSCULAR | Status: AC
  Filled 2017-06-12: qty 30

## 2017-06-12 MED ORDER — FENTANYL CITRATE (PF) 100 MCG/2ML IJ SOLN
25.0000 ug | INTRAMUSCULAR | Status: AC | PRN
  Administered 2017-06-12 (×6): 25 ug via INTRAVENOUS

## 2017-06-12 MED ORDER — CLINDAMYCIN PHOSPHATE 900 MG/50ML IV SOLN
INTRAVENOUS | Status: AC
  Filled 2017-06-12: qty 50

## 2017-06-12 MED ORDER — CHLORHEXIDINE GLUCONATE CLOTH 2 % EX PADS: 6.0000 | MEDICATED_PAD | Freq: Once | CUTANEOUS | Status: DC

## 2017-06-12 MED ORDER — LIDOCAINE HCL (PF) 2 % IJ SOLN
INTRAMUSCULAR | Status: AC
  Filled 2017-06-12: qty 2

## 2017-06-12 MED ORDER — SODIUM CHLORIDE 0.9 % IV SOLN
INTRAVENOUS | Status: DC
  Administered 2017-06-12: 25 mL/h via INTRAVENOUS

## 2017-06-12 MED ORDER — OXYCODONE HCL 5 MG PO TABS: 5.0000 mg | ORAL_TABLET | Freq: Four times a day (QID) | ORAL | 0 refills | Status: DC | PRN

## 2017-06-12 MED ORDER — ONDANSETRON HCL 4 MG/2ML IJ SOLN
INTRAMUSCULAR | Status: DC | PRN
Start: 2017-06-12 — End: 2017-06-12
  Administered 2017-06-12: 4 mg via INTRAVENOUS

## 2017-06-12 MED ORDER — LACTATED RINGERS IV SOLN
INTRAVENOUS | Status: DC | PRN
  Administered 2017-06-12: 08:00:00 via INTRAVENOUS

## 2017-06-12 MED ORDER — LIDOCAINE HCL (CARDIAC) 20 MG/ML IV SOLN
INTRAVENOUS | Status: DC | PRN
  Administered 2017-06-12: 100 mg via INTRAVENOUS

## 2017-06-12 MED ORDER — FENTANYL CITRATE (PF) 100 MCG/2ML IJ SOLN
INTRAMUSCULAR | Status: DC | PRN
  Administered 2017-06-12 (×2): 50 ug via INTRAVENOUS

## 2017-06-12 MED ORDER — PROPOFOL 10 MG/ML IV BOLUS
INTRAVENOUS | Status: AC
  Filled 2017-06-12: qty 20

## 2017-06-12 MED ORDER — ONDANSETRON HCL 4 MG/2ML IJ SOLN: 4.0000 mg | Freq: Once | INTRAMUSCULAR | Status: DC | PRN

## 2017-06-12 MED ORDER — FENTANYL CITRATE (PF) 100 MCG/2ML IJ SOLN
INTRAMUSCULAR | Status: AC
  Filled 2017-06-12: qty 2

## 2017-06-12 MED ORDER — GLYCOPYRROLATE 0.2 MG/ML IJ SOLN
INTRAMUSCULAR | Status: DC | PRN
  Administered 2017-06-12: 0.2 mg via INTRAVENOUS

## 2017-06-12 MED ORDER — PHENYLEPHRINE HCL 10 MG/ML IJ SOLN
INTRAMUSCULAR | Status: AC
  Filled 2017-06-12: qty 1

## 2017-06-12 MED ORDER — NEOMYCIN-POLYMYXIN B GU 40-200000 IR SOLN
Status: AC
  Filled 2017-06-12: qty 2

## 2017-06-12 SURGICAL SUPPLY — 44 items
BANDAGE ELASTIC 4 LF NS (GAUZE/BANDAGES/DRESSINGS) ×2 IMPLANT
BLADE SURG MINI STRL (BLADE) ×2 IMPLANT
BNDG COHESIVE 3X5 WHT NS (GAUZE/BANDAGES/DRESSINGS) ×2 IMPLANT
BNDG ESMARK 4X12 TAN STRL LF (GAUZE/BANDAGES/DRESSINGS) ×2 IMPLANT
CANISTER SUCT 1200ML W/VALVE (MISCELLANEOUS) ×2 IMPLANT
CORD BIP STRL DISP 12FT (MISCELLANEOUS) ×2 IMPLANT
CUFF TOURN 18 STER (MISCELLANEOUS) ×2 IMPLANT
DRAPE IMP U-DRAPE 54X76 (DRAPES) ×2 IMPLANT
DRAPE SURG 17X11 SM STRL (DRAPES) ×2 IMPLANT
DURAPREP 26ML APPLICATOR (WOUND CARE) ×2 IMPLANT
ELECT REM PT RETURN 9FT ADLT (ELECTROSURGICAL) ×2
ELECTRODE REM PT RTRN 9FT ADLT (ELECTROSURGICAL) ×1 IMPLANT
FORCEPS JEWEL BIP 4-3/4 STR (INSTRUMENTS) ×2 IMPLANT
GAUZE FLUFF 18X24 1PLY STRL (GAUZE/BANDAGES/DRESSINGS) ×2 IMPLANT
GAUZE PETRO XEROFOAM 1X8 (MISCELLANEOUS) ×2 IMPLANT
GAUZE SPONGE 4X4 12PLY STRL (GAUZE/BANDAGES/DRESSINGS) ×2 IMPLANT
GAUZE XEROFORM 4X4 STRL (GAUZE/BANDAGES/DRESSINGS) ×2 IMPLANT
GLOVE BIOGEL PI IND STRL 9 (GLOVE) ×1 IMPLANT
GLOVE BIOGEL PI INDICATOR 9 (GLOVE) ×1
GLOVE SURG 9.0 ORTHO LTXF (GLOVE) ×8 IMPLANT
GOWN STRL REUS TWL 2XL XL LVL4 (GOWN DISPOSABLE) ×2 IMPLANT
GOWN STRL REUS W/ TWL LRG LVL3 (GOWN DISPOSABLE) ×1 IMPLANT
GOWN STRL REUS W/TWL LRG LVL3 (GOWN DISPOSABLE) ×1
KIT RM TURNOVER STRD PROC AR (KITS) ×2 IMPLANT
NEEDLE FILTER BLUNT 18X 1/2SAF (NEEDLE) ×1
NEEDLE FILTER BLUNT 18X1 1/2 (NEEDLE) ×1 IMPLANT
NS IRRIG 500ML POUR BTL (IV SOLUTION) ×2 IMPLANT
PACK EXTREMITY ARMC (MISCELLANEOUS) ×2 IMPLANT
PAD CAST CTTN 4X4 STRL (SOFTGOODS) ×1 IMPLANT
PADDING CAST 4IN STRL (MISCELLANEOUS) ×1
PADDING CAST BLEND 4X4 STRL (MISCELLANEOUS) ×1 IMPLANT
PADDING CAST COTTON 4X4 STRL (SOFTGOODS) ×1
SLING ARM LRG DEEP (SOFTGOODS) ×2 IMPLANT
SLING ARM M TX990204 (SOFTGOODS) IMPLANT
SPLINT CAST 1 STEP 3X12 (MISCELLANEOUS) ×2 IMPLANT
STOCKINETTE STRL 4IN 9604848 (GAUZE/BANDAGES/DRESSINGS) ×2 IMPLANT
STRIP CLOSURE SKIN 1/2X4 (GAUZE/BANDAGES/DRESSINGS) ×2 IMPLANT
SUT ETHILON 3-0 FS-10 30 BLK (SUTURE)
SUT ETHILON 4-0 (SUTURE) ×2
SUT ETHILON 4-0 FS2 18XMFL BLK (SUTURE) ×2
SUT ETHILON 5-0 FS-2 18 BLK (SUTURE) IMPLANT
SUTURE EHLN 3-0 FS-10 30 BLK (SUTURE) IMPLANT
SUTURE ETHLN 4-0 FS2 18XMF BLK (SUTURE) ×2 IMPLANT
SYR 3ML LL SCALE MARK (SYRINGE) ×2 IMPLANT

## 2017-06-12 NOTE — Anesthesia Postprocedure Evaluation (Signed)
Anesthesia Post Note  Patient: Kristine Rangel  Procedure(s) Performed: Procedure(s) (LRB): CARPAL TUNNEL RELEASE (Right)  Patient location during evaluation: PACU Anesthesia Type: General Level of consciousness: awake and alert Pain management: pain level controlled Vital Signs Assessment: post-procedure vital signs reviewed and stable Respiratory status: spontaneous breathing, nonlabored ventilation, respiratory function stable and patient connected to nasal cannula oxygen Cardiovascular status: blood pressure returned to baseline and stable Postop Assessment: no signs of nausea or vomiting Anesthetic complications: no     Last Vitals:  Vitals:   06/12/17 1009 06/12/17 1015  BP: (!) 114/47 (!) 124/48  Pulse: 88 85  Resp: 20 20  Temp: (!) 36 C     Last Pain:  Vitals:   06/12/17 1015  TempSrc:   PainSc: 8                  Christy Friede S

## 2017-06-12 NOTE — Anesthesia Procedure Notes (Signed)
Procedure Name: LMA Insertion Date/Time: 06/12/2017 7:50 AM Performed by: Ginger CarneMICHELET, Elizabeht Suto Pre-anesthesia Checklist: Patient identified, Emergency Drugs available, Suction available and Patient being monitored Patient Re-evaluated:Patient Re-evaluated prior to induction Oxygen Delivery Method: Circle system utilized Preoxygenation: Pre-oxygenation with 100% oxygen Induction Type: IV induction LMA: LMA inserted LMA Size: 3.5 Tube type: Oral Number of attempts: 1 Placement Confirmation: positive ETCO2 and breath sounds checked- equal and bilateral Tube secured with: Tape Dental Injury: Teeth and Oropharynx as per pre-operative assessment  Comments: LMA placed by P. Primitivo GauzeFletcher CRNA

## 2017-06-12 NOTE — H&P (Signed)
The patient has been re-examined, and the chart reviewed, and there have been no interval changes to the documented history and physical.    The risks, benefits, and alternatives have been discussed at length, and the patient is willing to proceed.   

## 2017-06-12 NOTE — Discharge Instructions (Signed)

## 2017-06-12 NOTE — Op Note (Signed)
  06/12/2017  9:03 AM  PATIENT:  Kristine Rangel    PRE-OPERATIVE DIAGNOSIS:  Carpal tunnel syndrome right upper limb,hand numbness  POST-OPERATIVE DIAGNOSIS:  Same  PROCEDURE:  RIGHT CARPAL TUNNEL RELEASE  SURGEON:  Thornton Park, MD  ANESTHESIA:   General  PREOPERATIVE INDICATIONS:  Kristine Rangel is a  61 y.o. female with a diagnosis of G56.01 Carpal tunnel syndrome right upper limb,hand numbness who failed conservative measures and elected for surgical management.    I discussed the risks and benefits of surgery. The risks include but are not limited to infection, bleeding, nerve or blood vessel injury, joint stiffness or loss of motion, persistent pain, weakness, recurrence of symptoms and the need for further surgery. Medical risks include but are not limited to DVT and pulmonary embolism, myocardial infarction, stroke, pneumonia, respiratory failure and death. Patient understood these risks and wished to proceed.   OPERATIVE FINDINGS: Significant median nerve compression at the carpal tunnel, right upper extremity  OPERATIVE PROCEDURE: Patient was met in the preoperative area. I signed the right wrist with my initials and the word yes according the hospital's correct site of surgery protocol. The H&P was updated. I answered all the patient's questions. She was then brought to the operating room where she underwent general with LMA.  The patient was positioned supine on the operative table. The right arm was placed on a hand table. A tourniquet was applied to the right upper extremity. She was prepped and draped in a sterile fashion. A timeout was performed to verify the patient's name, date of birth, medical record number, correct site of surgery correct procedure to be performed. The time out was also used to confirm the patient received antibiotics that all necessary instruments were available in the room. The right upper extremity was then exsanguinated with an Esmarch and the  tourniquet inflated to 250 mmHg.   An incision following the palmar crease was made. This was made in line with the web space between the middle and ring fingers and the distal extent of the incision was where it intersected Kaplan's cardinal line. Bleeding vessels were cauterized with a bipolar.  The subcutaneous tissue was carefully dissected out with a Metzenbaum scissor and pickup until the palmar fascia was encountered. The distal extent of the transverse carpal ligament was then identified. A Freer elevator was placed under the transverse carpal ligament running distally to proximally. A micro-Beaver blade was then used to incise the transverse carpal ligament taking care to avoid injury to any neurovascular structures. The carpal tunnel was found to be extremely constricted. There was significant compression on the median nerve. The transverse carpal ligament was completely released. The nerve was visualized in its entirety in the carpal tunnel. The wound was copiously irrigated. The skin was then approximated with 5-0 nylon. Xeroform was placed over the incision. A dry sterile dressing was applied along with a volar fiberglass splint. Patient was overwrapped with an Ace wrap. The tourniquet was deflated at 33 minutes.  Sling was placed on the right upper extremity. The patient was extubated and brought to the PACU in stable condition. I was scrubbed and present the entire case and all sharp and instrument counts were correct at the conclusion the case.     Timoteo Gaul, MD

## 2017-06-12 NOTE — Transfer of Care (Signed)
Immediate Anesthesia Transfer of Care Note  Patient: Kristine Rangel  Procedure(s) Performed: Procedure(s): CARPAL TUNNEL RELEASE (Right)  Patient Location: PACU  Anesthesia Type:General  Level of Consciousness: sedated  Airway & Oxygen Therapy: Patient Spontanous Breathing and Patient connected to nasal cannula oxygen  Post-op Assessment: Report given to RN and Post -op Vital signs reviewed and stable  Post vital signs: Reviewed and stable  Last Vitals:  Vitals:   06/12/17 0634  BP: 136/64  Pulse: 77  Resp: 16  Temp: (!) 36.1 C    Last Pain:  Vitals:   06/12/17 0634  TempSrc: Tympanic  PainSc: 8       Patients Stated Pain Goal: 1 (06/12/17 [id number])  Complications: No apparent anesthesia complications

## 2017-06-12 NOTE — Anesthesia Preprocedure Evaluation (Signed)
Anesthesia Evaluation  Patient identified by MRN, date of birth, ID band Patient awake    Reviewed: Allergy & Precautions, NPO status , Patient's Chart, lab work & pertinent test results, reviewed documented beta blocker date and time   Airway Mallampati: III  TM Distance: >3 FB     Dental  (+) Chipped   Pulmonary shortness of breath, asthma , former smoker,           Cardiovascular hypertension, Pt. on medications + Past MI, + Peripheral Vascular Disease and +CHF       Neuro/Psych  Headaches, CVA    GI/Hepatic GERD  Controlled,  Endo/Other  diabetes, Type obesity  Renal/GU Renal disease     Musculoskeletal  (+) Arthritis ,   Abdominal   Peds  Hematology   Anesthesia Other Findings   Reproductive/Obstetrics                             Anesthesia Physical Anesthesia Plan  ASA: III  Anesthesia Plan: General   Post-op Pain Management:    Induction: Intravenous  PONV Risk Score and Plan:   Airway Management Planned: Oral ETT and LMA  Additional Equipment:   Intra-op Plan:   Post-operative Plan:   Informed Consent: I have reviewed the patients History and Physical, chart, labs and discussed the procedure including the risks, benefits and alternatives for the proposed anesthesia with the patient or authorized representative who has indicated his/her understanding and acceptance.     Plan Discussed with: CRNA  Anesthesia Plan Comments:         Anesthesia Quick Evaluation

## 2017-06-12 NOTE — Anesthesia Post-op Follow-up Note (Cosign Needed)
Anesthesia QCDR form completed.        

## 2017-08-19 ENCOUNTER — Emergency Department
Admission: EM | Admit: 2017-08-19 | Discharge: 2017-08-19 | Disposition: A | Discharge status: Home / Self Care | Payer: Medicare Other | Attending: Emergency Medicine | Admitting: Emergency Medicine

## 2017-08-19 ENCOUNTER — Emergency Department: Payer: Medicare Other

## 2017-08-19 DIAGNOSIS — M545 Low back pain: Secondary | ICD-10-CM | POA: Insufficient documentation

## 2017-08-19 DIAGNOSIS — Z8673 Personal history of transient ischemic attack (TIA), and cerebral infarction without residual deficits: Secondary | ICD-10-CM | POA: Diagnosis not present

## 2017-08-19 DIAGNOSIS — N189 Chronic kidney disease, unspecified: Secondary | ICD-10-CM | POA: Diagnosis not present

## 2017-08-19 DIAGNOSIS — Z87891 Personal history of nicotine dependence: Secondary | ICD-10-CM | POA: Diagnosis not present

## 2017-08-19 DIAGNOSIS — G8929 Other chronic pain: Secondary | ICD-10-CM | POA: Insufficient documentation

## 2017-08-19 DIAGNOSIS — Z79899 Other long term (current) drug therapy: Secondary | ICD-10-CM | POA: Diagnosis not present

## 2017-08-19 DIAGNOSIS — E1122 Type 2 diabetes mellitus with diabetic chronic kidney disease: Secondary | ICD-10-CM | POA: Diagnosis not present

## 2017-08-19 DIAGNOSIS — I130 Hypertensive heart and chronic kidney disease with heart failure and stage 1 through stage 4 chronic kidney disease, or unspecified chronic kidney disease: Secondary | ICD-10-CM | POA: Diagnosis not present

## 2017-08-19 DIAGNOSIS — J45909 Unspecified asthma, uncomplicated: Secondary | ICD-10-CM | POA: Diagnosis not present

## 2017-08-19 DIAGNOSIS — I509 Heart failure, unspecified: Secondary | ICD-10-CM | POA: Insufficient documentation

## 2017-08-19 DIAGNOSIS — R079 Chest pain, unspecified: Secondary | ICD-10-CM

## 2017-08-19 DIAGNOSIS — R110 Nausea: Secondary | ICD-10-CM | POA: Insufficient documentation

## 2017-08-19 DIAGNOSIS — M62838 Other muscle spasm: Secondary | ICD-10-CM | POA: Diagnosis not present

## 2017-08-19 DIAGNOSIS — Z7982 Long term (current) use of aspirin: Secondary | ICD-10-CM | POA: Insufficient documentation

## 2017-08-19 LAB — CBC WITH DIFFERENTIAL/PLATELET
BASOS PCT: 1 %
Basophils Absolute: 0 10*3/uL (ref 0–0.1)
EOS ABS: 0.3 10*3/uL (ref 0–0.7)
EOS PCT: 9 %
HCT: 36.3 % (ref 35.0–47.0)
Hemoglobin: 12 g/dL (ref 12.0–16.0)
LYMPHS ABS: 1.2 10*3/uL (ref 1.0–3.6)
Lymphocytes Relative: 40 %
MCH: 27.8 pg (ref 26.0–34.0)
MCHC: 33 g/dL (ref 32.0–36.0)
MCV: 84.5 fL (ref 80.0–100.0)
MONOS PCT: 13 %
Monocytes Absolute: 0.4 10*3/uL (ref 0.2–0.9)
Neutro Abs: 1.2 10*3/uL — ABNORMAL LOW (ref 1.4–6.5)
Neutrophils Relative %: 37 %
PLATELETS: 308 10*3/uL (ref 150–440)
RBC: 4.29 MIL/uL (ref 3.80–5.20)
RDW: 15.1 % — ABNORMAL HIGH (ref 11.5–14.5)
WBC: 3.1 10*3/uL — AB (ref 3.6–11.0)

## 2017-08-19 LAB — COMPREHENSIVE METABOLIC PANEL
ALBUMIN: 3.3 g/dL — AB (ref 3.5–5.0)
ALK PHOS: 97 U/L (ref 38–126)
ALT: 10 U/L — ABNORMAL LOW (ref 14–54)
ANION GAP: 7 (ref 5–15)
AST: 15 U/L (ref 15–41)
BILIRUBIN TOTAL: 0.5 mg/dL (ref 0.3–1.2)
BUN: 16 mg/dL (ref 6–20)
CALCIUM: 9.5 mg/dL (ref 8.9–10.3)
CO2: 21 mmol/L — ABNORMAL LOW (ref 22–32)
Chloride: 112 mmol/L — ABNORMAL HIGH (ref 101–111)
Creatinine, Ser: 1.07 mg/dL — ABNORMAL HIGH (ref 0.44–1.00)
GFR calc Af Amer: >60 mL/min (ref >60)
GFR, EST NON AFRICAN AMERICAN: 54 mL/min — AB (ref >60)
GLUCOSE: 92 mg/dL (ref 65–99)
Potassium: 3.7 mmol/L (ref 3.5–5.1)
Sodium: 140 mmol/L (ref 135–145)
TOTAL PROTEIN: 6.3 g/dL — AB (ref 6.5–8.1)

## 2017-08-19 LAB — TROPONIN I: Troponin I: <0.03 ng/mL (ref <0.03)

## 2017-08-19 LAB — LIPASE, BLOOD: LIPASE: 20 U/L (ref 11–51)

## 2017-08-19 MED ORDER — CYCLOBENZAPRINE HCL 10 MG PO TABS
10.0000 mg | ORAL_TABLET | Freq: Once | ORAL | Status: AC
  Administered 2017-08-19: 10 mg via ORAL
  Filled 2017-08-19: qty 1

## 2017-08-19 MED ORDER — CYCLOBENZAPRINE HCL 10 MG PO TABS: 10.0000 mg | ORAL_TABLET | Freq: Three times a day (TID) | ORAL | 0 refills | Status: DC | PRN

## 2017-08-19 MED ORDER — TRAMADOL HCL 50 MG PO TABS: 50.0000 mg | ORAL_TABLET | Freq: Four times a day (QID) | ORAL | 0 refills | Status: DC | PRN

## 2017-08-19 MED ORDER — KETOROLAC TROMETHAMINE 30 MG/ML IJ SOLN
30.0000 mg | Freq: Once | INTRAMUSCULAR | Status: AC
  Administered 2017-08-19: 30 mg via INTRAVENOUS
  Filled 2017-08-19: qty 1

## 2017-08-19 MED ORDER — SODIUM CHLORIDE 0.9 % IV BOLUS (SEPSIS)
1000.0000 mL | Freq: Once | INTRAVENOUS | Status: AC
  Administered 2017-08-19: 1000 mL via INTRAVENOUS

## 2017-08-19 MED ORDER — ONDANSETRON HCL 4 MG/2ML IJ SOLN
4.0000 mg | Freq: Once | INTRAMUSCULAR | Status: AC
  Administered 2017-08-19: 4 mg via INTRAVENOUS
  Filled 2017-08-19: qty 2

## 2017-08-19 MED ORDER — TRAMADOL HCL 50 MG PO TABS
50.0000 mg | ORAL_TABLET | Freq: Once | ORAL | Status: AC
  Administered 2017-08-19: 50 mg via ORAL
  Filled 2017-08-19: qty 1

## 2017-08-19 MED ORDER — ONDANSETRON HCL 4 MG PO TABS: 4.0000 mg | ORAL_TABLET | Freq: Three times a day (TID) | ORAL | 0 refills | Status: DC | PRN

## 2017-08-19 NOTE — ED Triage Notes (Signed)
Pt reports central chest pain with radiation down right arm. Also c/o n/v starting last night. Some sob. Pt also reports swelling to right hand, history of carpal tunnel surgery about 3 months ago. VS stable.

## 2017-08-19 NOTE — Discharge Instructions (Signed)
You were evaluated for right arm pain and chest pain and although no certain cause was found your exam and evaluation are overall reassuring in the ER today.  I suspect your pain is from muscle spasm of the trapezius muscle.  You are prescribed muscle relaxer Flexeril and pain medication tramadol to help with symptoms.  Try to stretch and use heat as needed.  Return to the ER immediately for any new or worsening condition including worsening or uncontrolled pain, chest pain or shortness of breath or trouble breathing, palpitations, dizziness or passing out, abdominal pain, weakness, numbness, or any other symptoms concerning to you.

## 2017-08-19 NOTE — ED Notes (Signed)
Warm blanket given

## 2017-08-19 NOTE — ED Provider Notes (Signed)
[facility] Emergency Department Provider Note ____________________________________________   I have reviewed the triage vital signs and the triage nursing note.  HISTORY  Chief Complaint Chest Pain   Historian Patient  HPI Kristine Rangel is a 61 y.o. female obese, diabetic, history of gerd, "mild heart attack," stroke, brain aneurysm with coils and right carpal tunnel surgery a few months ago presents with several hours of nausea and vomiting.  Reports overnight developed pain into the right wrist where she had surgery a few months ago and that started to go up the arm into the right shoulder and right neck, across the chest and down to the left side and back into the legs.  Reports then developed nausea and several episodes of nonbloody nonbilious vomiting.  No fevers.  No constipation or diarrhea.  Pain is mild at present, was moderate.  Nothing making it better or worse.  Low back pain is most prominent now.  No abdominal pain.  Has chronic low back pain for which she takes tramadol (out of) and meloxicam.      Past Medical History:  Diagnosis Date  . Arthritis   . Asthma   . Bladder leak   . Cerebral aneurysm    history-has coils  . CHF (congestive heart failure) (HCC)   . Chronic kidney disease   . Diabetes mellitus without complication (HCC)   . Dyspnea   . GERD (gastroesophageal reflux disease)   . Glaucoma   . Headache   . History of kidney stones   . Hyperlipidemia   . Hypertension   . MI (myocardial infarction) (HCC)    unsure when  . Neuropathy   . Rotator cuff injury   . Seasonal allergies   . Stroke [facility])    TIA's    Patient Active Problem List   Diagnosis Date Noted  . S/P left rotator cuff repair 11/23/2016    Past Surgical History:  Procedure Laterality Date  . BRAIN SURGERY Right 2010   anuerysm repair  . CARPAL TUNNEL RELEASE Right 06/12/2017   Procedure: CARPAL TUNNEL RELEASE;  Surgeon: Juanell Fairly, MD;   Location: [facility] ORS;  Service: Orthopedics;  Laterality: Right;  . CHOLECYSTECTOMY    . JOINT REPLACEMENT Bilateral    TOTAL KNEE REPLACEMENT  . SHOULDER ARTHROSCOPY WITH OPEN ROTATOR CUFF REPAIR Left 11/23/2016   Procedure: SHOULDER ARTHROSCOPY WITH OPEN ROTATOR CUFF REPAIR, ARTHROSCOPIC SUBACROMIAL DECOMPRESSION, DISTAL CLAVICLE EXCISION;  Surgeon: Juanell Fairly, MD;  Location: [facility] ORS;  Service: Orthopedics;  Laterality: Left;    Prior to Admission medications   Medication Sig Start Date End Date Taking? Authorizing Provider  albuterol (PROVENTIL) (2.5 MG/3ML) 0.083% nebulizer solution Take 2.5 mg by nebulization every 6 (six) hours as needed for wheezing or shortness of breath.  03/16/16  Yes [provider]  aspirin 81 MG chewable tablet Chew 81 mg by mouth daily.    Yes [provider]  atorvastatin (LIPITOR) 40 MG tablet Take 40 mg by mouth at bedtime. 03/13/16  Yes [provider]  brimonidine-timolol (COMBIGAN) 0.2-0.5 % ophthalmic solution Place 1 drop into both eyes 2 (two) times daily.   Yes [provider]  cetirizine (ZYRTEC) 10 MG tablet Take 10 mg by mouth daily.   Yes [provider]  DULoxetine (CYMBALTA) 60 MG capsule Take 60 mg by mouth at bedtime.  03/13/16  Yes [provider]  EPINEPHrine 0.3 mg/0.3 mL IJ SOAJ injection Inject 0.3 mg into the skin once as needed (allergic reactions).  Yes [provider]  esomeprazole (NEXIUM) 40 MG capsule Take 40 mg by mouth daily. 03/17/16  Yes [provider]  fluticasone (FLONASE) 50 MCG/ACT nasal spray Place 1 spray into both nostrils daily as needed for allergies or rhinitis.  03/16/16  Yes [provider]  furosemide (LASIX) 40 MG tablet Take 40 mg by mouth 2 (two) times daily.    Yes [provider]  latanoprost (XALATAN) 0.005 % ophthalmic solution Place 1 drop into both eyes at bedtime.   Yes [provider]  meloxicam (MOBIC) 7.5 MG  tablet Take 7.5 mg by mouth 2 (two) times daily. 08/11/17  Yes [provider]  mirabegron ER (MYRBETRIQ) 50 MG TB24 tablet Take 50 mg by mouth daily.   Yes [provider]  potassium chloride (K-DUR) 10 MEQ tablet Take 10 mEq by mouth daily. 03/13/16  Yes [provider]  SYMBICORT 160-4.5 MCG/ACT inhaler Inhale 2 puffs into the lungs 2 (two) times daily. 03/13/16  Yes [provider]  tiZANidine (ZANAFLEX) 4 MG tablet Take 2-4 mg by mouth every 6 (six) hours as needed for muscle spasms.  09/26/16  Yes [provider]  topiramate (TOPAMAX) 100 MG tablet Take 100 mg by mouth at bedtime.  03/13/16  Yes [provider]  traMADol (ULTRAM) 50 MG tablet Take 50 mg by mouth every 6 (six) hours as needed. 07/20/17  Yes [provider]  meloxicam (MOBIC) 15 MG tablet Take 1 tablet (15 mg total) by mouth daily. Patient not taking: Reported on 08/19/2017 10/12/15   Evangeline Dakin, PA-C  oxyCODONE (OXY IR/ROXICODONE) 5 MG immediate release tablet Take 1 tablet (5 mg total) by mouth every 6 (six) hours as needed for severe pain. Patient not taking: Reported on 08/19/2017 06/12/17   Juanell Fairly, MD    Allergies  Allergen Reactions  . Bee Venom Anaphylaxis  . Penicillins Hives and Swelling    Has patient had a PCN reaction causing immediate rash, facial/tongue/throat swelling, SOB or lightheadedness with hypotension: Yes Has patient had a PCN reaction causing severe rash involving mucus membranes or skin necrosis: No Has patient had a PCN reaction that required hospitalization No Has patient had a PCN reaction occurring within the last 10 years: No If all of the above answers are "NO", then may proceed with Cephalosporin use.    Family History  Problem Relation Age of Onset  . Breast cancer Maternal Aunt     Social History Social History  Substance Use Topics  . Smoking status: Former Smoker    Quit date: 11/17/1987  . Smokeless tobacco:  Current User    Types: Snuff  . Alcohol use No    Review of Systems  Constitutional: Negative for fever. Eyes: Negative for visual changes. ENT: Negative for sore throat. Cardiovascular: Negative for palpitations. Respiratory: Negative for shortness of breath. Gastrointestinal: Negative for diarrhea. Genitourinary: Negative for dysuria. Musculoskeletal: Positive for chronic low back pain. Skin: Negative for rash. Neurological: Negative for headache.  ____________________________________________   PHYSICAL EXAM:  VITAL SIGNS: ED Triage Vitals [08/19/17 0858]  Enc Vitals Group     BP (!) 145/79     Pulse Rate 63     Resp 20     Temp 98.5 F (36.9 C)     Temp Source Oral     SpO2 99 %     Weight 265 lb (120.2 kg)     Height  (1.676 m)     Head Circumference  Peak Flow      Pain Score      Pain Loc      Pain Edu?      Excl. in GC?      Constitutional: Alert and oriented. Well appearing and in no distress. HEENT   Head: Normocephalic and atraumatic.      Eyes: Conjunctivae are normal. Pupils equal and round.       Ears:         Nose: No congestion/rhinnorhea.   Mouth/Throat: Mucous membranes are moist.   Neck: No stridor. Cardiovascular/Chest: Normal rate, regular rhythm.  No murmurs, rubs, or gallops. Respiratory: Normal respiratory effort without tachypnea nor retractions. Breath sounds are clear and equal bilaterally. No wheezes/rales/rhonchi. Gastrointestinal: Soft. No distention, no guarding, no rebound. Nontender.  Obese  Genitourinary/rectal:Deferred Musculoskeletal: Right wrist with incision, no redness or swelling.   Very tender and spastic right trapezius muscle.  Nontender with normal range of motion in all extremities. No joint effusions.  No lower extremity tenderness.  No edema. Neurologic:  Normal speech and language. No gross or focal neurologic deficits are appreciated. Skin:  Skin is warm, dry and intact. No rash  noted. Psychiatric: Mood and affect are normal. Speech and behavior are normal. Patient exhibits appropriate insight and judgment.   ____________________________________________  LABS (pertinent positives/negatives) I, Governor Rooks, MD the attending physician have reviewed the labs noted below.  Labs Reviewed  COMPREHENSIVE METABOLIC PANEL - Abnormal; Notable for the following:       Result Value   Chloride 112 (*)    CO2 21 (*)    Creatinine, Ser 1.07 (*)    Total Protein 6.3 (*)    Albumin 3.3 (*)    ALT 10 (*)    GFR calc non Af Amer 54 (*)    All other components within normal limits  CBC WITH DIFFERENTIAL/PLATELET - Abnormal; Notable for the following:    WBC 3.1 (*)    RDW 15.1 (*)    Neutro Abs 1.2 (*)    All other components within normal limits  LIPASE, BLOOD  TROPONIN I  URINALYSIS, COMPLETE (UACMP) WITH MICROSCOPIC    ____________________________________________    EKG I, Governor Rooks, MD, the attending physician have personally viewed and interpreted all ECGs.  62 bpm. Normal sinus rhythm. Narrow QRS. Normal axis. Normal ST and T-wave ____________________________________________  RADIOLOGY All Xrays were viewed by me.  Imaging interpreted by Radiologist, and I, Governor Rooks, MD the attending physician have reviewed the radiologist interpretation noted below.  Chest portable: IMPRESSION: No acute cardiopulmonary disease.  __________________________________________  PROCEDURES  Procedure(s) performed: None  Critical Care performed: None  ____________________________________________  No current facility-administered medications on file prior to encounter.    Current Outpatient Prescriptions on File Prior to Encounter  Medication Sig Dispense Refill  . albuterol (PROVENTIL) (2.5 MG/3ML) 0.083% nebulizer solution Take 2.5 mg by nebulization every 6 (six) hours as needed for wheezing or shortness of breath.     Marland Kitchen aspirin 81 MG chewable tablet Chew  81 mg by mouth daily.     Marland Kitchen atorvastatin (LIPITOR) 40 MG tablet Take 40 mg by mouth at bedtime.    . brimonidine-timolol (COMBIGAN) 0.2-0.5 % ophthalmic solution Place 1 drop into both eyes 2 (two) times daily.    . cetirizine (ZYRTEC) 10 MG tablet Take 10 mg by mouth daily.    . DULoxetine (CYMBALTA) 60 MG capsule Take 60 mg by mouth at bedtime.     Marland Kitchen EPINEPHrine 0.3 mg/0.3 mL  IJ SOAJ injection Inject 0.3 mg into the skin once as needed (allergic reactions).     . esomeprazole (NEXIUM) 40 MG capsule Take 40 mg by mouth daily.    . fluticasone (FLONASE) 50 MCG/ACT nasal spray Place 1 spray into both nostrils daily as needed for allergies or rhinitis.     . furosemide (LASIX) 40 MG tablet Take 40 mg by mouth 2 (two) times daily.     Marland Kitchen latanoprost (XALATAN) 0.005 % ophthalmic solution Place 1 drop into both eyes at bedtime.    . mirabegron ER (MYRBETRIQ) 50 MG TB24 tablet Take 50 mg by mouth daily.    . potassium chloride (K-DUR) 10 MEQ tablet Take 10 mEq by mouth daily.    . SYMBICORT 160-4.5 MCG/ACT inhaler Inhale 2 puffs into the lungs 2 (two) times daily.    Marland Kitchen tiZANidine (ZANAFLEX) 4 MG tablet Take 2-4 mg by mouth every 6 (six) hours as needed for muscle spasms.     Marland Kitchen topiramate (TOPAMAX) 100 MG tablet Take 100 mg by mouth at bedtime.     . meloxicam (MOBIC) 15 MG tablet Take 1 tablet (15 mg total) by mouth daily. (Patient not taking: Reported on 08/19/2017) 30 tablet 0  . oxyCODONE (OXY IR/ROXICODONE) 5 MG immediate release tablet Take 1 tablet (5 mg total) by mouth every 6 (six) hours as needed for severe pain. (Patient not taking: Reported on 08/19/2017) 40 tablet 0    ____________________________________________  ED COURSE / ASSESSMENT AND PLAN  Pertinent labs & imaging results that were available during my care of the patient were reviewed by me and considered in my medical decision making (see chart for details).   Patient is currently at present mostly improved from the episode over  night that seems most pronounced oof right arm pain, not situated at the right shoulder where there is a tender trapezius muscle spasm.  She did however have some nausea and chest pain as well as flare of chronic low back pain.  Chest discomfort was well over 4 hours ago now, and no persistent chest pain.  laboratory evaluation and clinical exam are overall reassuring in the emergency department today.  She does have a very tender and palpable muscle spasm at the right trapezius muscle which I think may be part of it was giving her the pains down into her right arm. She's not swollen L think that she has a upper extremity DVT. Her symptoms included the wrist up to the arm to the shoulder across the chest and down into the side of the low left low back, I am not suspicious of an aortic, intrathoracic or intraabdominal emergency at this point in time in terms of the waxing and waning component, and the very tender muscle spasm of the right shoulder which is the only thing that really left right now.  We discussed the unusual presentation overnight from wrist to shoulder to chest to back and now just the shoulder.  We discussed worrisome signs and symptoms to watch for.  she's also previously been on tramadol and does not have a right now, and has chronic pain issues, I am going to give her a course of tramadol for the right-sided shoulder and muscle spasm, she does have a primary care doctor to follow up with.    DIFFERENTIAL DIAGNOSIS: Differential diagnosis includes, but is not limited to, ACS, aortic dissection, pulmonary embolism, cardiac tamponade, pneumothorax, pneumonia, pericarditis/myocarditis, GI-related causes including esophagitis/gastritis, and musculoskeletal chest wall pain.  CONSULTATIONS:   None   Patient / Family / Caregiver informed of clinical course, medical decision-making process, and agree with plan.   I discussed return precautions, follow-up instructions, and discharge  instructions with patient and/or family.  Discharge Instructions :You were evaluated for right arm pain and chest pain and although no certain cause was found your exam and evaluation are overall reassuring in the ER today.  I suspect your pain is from muscle spasm of the trapezius muscle.  You are prescribed muscle relaxer Flexeril and pain medication tramadol to help with symptoms.  Try to stretch and use heat as needed.  Return to the ER immediately for any new or worsening condition including worsening or uncontrolled pain, chest pain or shortness of breath or trouble breathing, palpitations, dizziness or passing out, abdominal pain, weakness, numbness, or any other symptoms concerning to you.  ___________________________________________   FINAL CLINICAL IMPRESSION(S) / ED DIAGNOSES   Final diagnoses:  Trapezius muscle spasm  Nonspecific chest pain  Chronic midline low back pain without sciatica  Nausea              Note: This dictation was prepared with Dragon dictation. Any transcriptional errors that result from this process are unintentional    Governor Rooks, MD 08/19/17 1045

## 2017-08-22 ENCOUNTER — Other Ambulatory Visit: Payer: Self-pay | Admitting: Orthopedic Surgery

## 2017-08-22 DIAGNOSIS — M25521 Pain in right elbow: Secondary | ICD-10-CM

## 2017-08-27 ENCOUNTER — Ambulatory Visit
Admission: RE | Admit: 2017-08-27 | Discharge: 2017-08-27 | Disposition: A | Discharge status: Home / Self Care | Payer: Medicare Other | Source: Ambulatory Visit | Attending: Orthopedic Surgery | Admitting: Orthopedic Surgery

## 2017-08-27 DIAGNOSIS — M25521 Pain in right elbow: Secondary | ICD-10-CM | POA: Diagnosis not present

## 2017-09-09 ENCOUNTER — Emergency Department
Admission: EM | Admit: 2017-09-09 | Discharge: 2017-09-09 | Disposition: A | Discharge status: Home / Self Care | Payer: Medicare Other | Attending: Emergency Medicine | Admitting: Emergency Medicine

## 2017-09-09 ENCOUNTER — Other Ambulatory Visit: Payer: Self-pay

## 2017-09-09 ENCOUNTER — Emergency Department: Payer: Medicare Other

## 2017-09-09 DIAGNOSIS — R079 Chest pain, unspecified: Secondary | ICD-10-CM | POA: Diagnosis not present

## 2017-09-09 DIAGNOSIS — Z87891 Personal history of nicotine dependence: Secondary | ICD-10-CM | POA: Insufficient documentation

## 2017-09-09 DIAGNOSIS — E1122 Type 2 diabetes mellitus with diabetic chronic kidney disease: Secondary | ICD-10-CM | POA: Diagnosis not present

## 2017-09-09 DIAGNOSIS — I509 Heart failure, unspecified: Secondary | ICD-10-CM | POA: Diagnosis not present

## 2017-09-09 DIAGNOSIS — Z7901 Long term (current) use of anticoagulants: Secondary | ICD-10-CM | POA: Insufficient documentation

## 2017-09-09 DIAGNOSIS — I130 Hypertensive heart and chronic kidney disease with heart failure and stage 1 through stage 4 chronic kidney disease, or unspecified chronic kidney disease: Secondary | ICD-10-CM | POA: Insufficient documentation

## 2017-09-09 DIAGNOSIS — Z91030 Bee allergy status: Secondary | ICD-10-CM | POA: Insufficient documentation

## 2017-09-09 DIAGNOSIS — N189 Chronic kidney disease, unspecified: Secondary | ICD-10-CM | POA: Diagnosis not present

## 2017-09-09 DIAGNOSIS — Z79899 Other long term (current) drug therapy: Secondary | ICD-10-CM | POA: Insufficient documentation

## 2017-09-09 DIAGNOSIS — Z7982 Long term (current) use of aspirin: Secondary | ICD-10-CM | POA: Insufficient documentation

## 2017-09-09 DIAGNOSIS — Z880 Allergy status to penicillin: Secondary | ICD-10-CM | POA: Diagnosis not present

## 2017-09-09 DIAGNOSIS — J45909 Unspecified asthma, uncomplicated: Secondary | ICD-10-CM | POA: Diagnosis not present

## 2017-09-09 DIAGNOSIS — M79601 Pain in right arm: Secondary | ICD-10-CM

## 2017-09-09 DIAGNOSIS — Z8673 Personal history of transient ischemic attack (TIA), and cerebral infarction without residual deficits: Secondary | ICD-10-CM | POA: Insufficient documentation

## 2017-09-09 LAB — BASIC METABOLIC PANEL
Anion gap: 3 — ABNORMAL LOW (ref 5–15)
BUN: 17 mg/dL (ref 6–20)
CHLORIDE: 106 mmol/L (ref 101–111)
CO2: 24 mmol/L (ref 22–32)
Calcium: 9.7 mg/dL (ref 8.9–10.3)
Creatinine, Ser: 1.06 mg/dL — ABNORMAL HIGH (ref 0.44–1.00)
GFR calc Af Amer: >60 mL/min (ref >60)
GFR calc non Af Amer: 55 mL/min — ABNORMAL LOW (ref >60)
GLUCOSE: 86 mg/dL (ref 65–99)
POTASSIUM: 3.9 mmol/L (ref 3.5–5.1)
Sodium: 133 mmol/L — ABNORMAL LOW (ref 135–145)

## 2017-09-09 LAB — CBC
HCT: 42.9 % (ref 35.0–47.0)
Hemoglobin: 13.6 g/dL (ref 12.0–16.0)
MCH: 27.1 pg (ref 26.0–34.0)
MCHC: 31.7 g/dL — AB (ref 32.0–36.0)
MCV: 85.5 fL (ref 80.0–100.0)
PLATELETS: 282 10*3/uL (ref 150–440)
RBC: 5.02 MIL/uL (ref 3.80–5.20)
RDW: 14.6 % — AB (ref 11.5–14.5)
WBC: 3.4 10*3/uL — ABNORMAL LOW (ref 3.6–11.0)

## 2017-09-09 LAB — TROPONIN I

## 2017-09-09 MED ORDER — HYDROCODONE-ACETAMINOPHEN 5-325 MG PO TABS: 1.0000 | ORAL_TABLET | ORAL | 0 refills | Status: DC | PRN

## 2017-09-09 MED ORDER — HYDROMORPHONE HCL 1 MG/ML IJ SOLN
1.0000 mg | Freq: Once | INTRAMUSCULAR | Status: AC
  Administered 2017-09-09: 1 mg via INTRAMUSCULAR
  Filled 2017-09-09: qty 1

## 2017-09-09 NOTE — Discharge Instructions (Signed)
We discussed please follow-up with your primary care doctor soon as possible and avoid pain management consultation as soon as possible.  Please take your pain medication as needed, as written.  Return to the emergency department for any acute worsening of pain, or any other symptom personally concerning to yourself.

## 2017-09-09 NOTE — ED Notes (Signed)
Patient does not appear to be in any acute distress at time of discharge. Patient wheeled to lobby in wheelchair. Patient denies any comments or concerns regarding discharge.  

## 2017-09-09 NOTE — ED Notes (Signed)
Unable to get labs, attempted x 2, previous nurse attempted x2 Will call lab to draw pt's labs.

## 2017-09-09 NOTE — ED Provider Notes (Signed)
[facility] Emergency Department Provider Note  Time seen: 2:46 PM  I have reviewed the triage vital signs and the nursing notes.   HISTORY  Chief Complaint Chest Pain    HPI Kristine Rangel is a 61 y.o. female with a past medical history of arthritis, CHF, cerebral aneurysm, diabetes, gastric reflux, hypertension, hyperlipidemia, neuropathy, presents to the emergency department for chest pain, right shoulder pain and right hand pain.  Patient and family state the pain has been ongoing for the past 3 months but has been worse over the past 3 days.  Patient has been seen in the emergency department for the same at her primary care doctor for the same and orthopedic surgery for the same.  Patient recently had an elbow MRI done on the right side which was essentially normal.  States the pain always starts in her right shoulder and then will go down into her chest shoot down the right arm into her hand.  She feels like her thumb is stuck at the proximal joint and is having pain anytime she attempts to move it.  Denies any history of gout.  Does state a history of arthritis.  Has had a prior carpal tunnel surgery to the right hand previously.  Patient has been seen by her primary care doctor and has been referred to pain management but has not yet had her first appointment.  Denies any shortness of breath.  Denies nausea or diaphoresis.     Past Medical History:  Diagnosis Date  . Arthritis   . Asthma   . Bladder leak   . Cerebral aneurysm    history-has coils  . CHF (congestive heart failure) (HCC)   . Chronic kidney disease   . Diabetes mellitus without complication (HCC)   . Dyspnea   . GERD (gastroesophageal reflux disease)   . Glaucoma   . Headache   . History of kidney stones   . Hyperlipidemia   . Hypertension   . MI (myocardial infarction) (HCC)    unsure when  . Neuropathy   . Rotator cuff injury   . Seasonal allergies   . Stroke [facility])    TIA's     Patient Active Problem List   Diagnosis Date Noted  . S/P left rotator cuff repair 11/23/2016    Past Surgical History:  Procedure Laterality Date  . BRAIN SURGERY Right 2010   anuerysm repair  . CARPAL TUNNEL RELEASE Right 06/12/2017   Procedure: CARPAL TUNNEL RELEASE;  Surgeon: Juanell Fairly, MD;  Location: [facility] ORS;  Service: Orthopedics;  Laterality: Right;  . CHOLECYSTECTOMY    . JOINT REPLACEMENT Bilateral    TOTAL KNEE REPLACEMENT  . SHOULDER ARTHROSCOPY WITH OPEN ROTATOR CUFF REPAIR Left 11/23/2016   Procedure: SHOULDER ARTHROSCOPY WITH OPEN ROTATOR CUFF REPAIR, ARTHROSCOPIC SUBACROMIAL DECOMPRESSION, DISTAL CLAVICLE EXCISION;  Surgeon: Juanell Fairly, MD;  Location: [facility] ORS;  Service: Orthopedics;  Laterality: Left;    Prior to Admission medications   Medication Sig Start Date End Date Taking? Authorizing Provider  albuterol (PROVENTIL) (2.5 MG/3ML) 0.083% nebulizer solution Take 2.5 mg by nebulization every 6 (six) hours as needed for wheezing or shortness of breath.  03/16/16   [provider]  aspirin 81 MG chewable tablet Chew 81 mg by mouth daily.     [provider]  atorvastatin (LIPITOR) 40 MG tablet Take 40 mg by mouth at bedtime. 03/13/16   [provider]  brimonidine-timolol (COMBIGAN) 0.2-0.5 % ophthalmic solution Place 1 drop into  both eyes 2 (two) times daily.    [provider]  cetirizine (ZYRTEC) 10 MG tablet Take 10 mg by mouth daily.    [provider]  cyclobenzaprine (FLEXERIL) 10 MG tablet Take 1 tablet (10 mg total) by mouth 3 (three) times daily as needed for muscle spasms. 08/19/17   Governor Rooks, MD  DULoxetine (CYMBALTA) 60 MG capsule Take 60 mg by mouth at bedtime.  03/13/16   [provider]  EPINEPHrine 0.3 mg/0.3 mL IJ SOAJ injection Inject 0.3 mg into the skin once as needed (allergic reactions).     [provider]  esomeprazole (NEXIUM) 40 MG capsule Take 40 mg by mouth daily.  03/17/16   [provider]  fluticasone (FLONASE) 50 MCG/ACT nasal spray Place 1 spray into both nostrils daily as needed for allergies or rhinitis.  03/16/16   [provider]  furosemide (LASIX) 40 MG tablet Take 40 mg by mouth 2 (two) times daily.     [provider]  latanoprost (XALATAN) 0.005 % ophthalmic solution Place 1 drop into both eyes at bedtime.    [provider]  meloxicam (MOBIC) 15 MG tablet Take 1 tablet (15 mg total) by mouth daily. 10/12/15   Beers, Charmayne Sheer, PA-C  meloxicam (MOBIC) 7.5 MG tablet Take 7.5 mg by mouth 2 (two) times daily. 08/11/17   [provider]  mirabegron ER (MYRBETRIQ) 50 MG TB24 tablet Take 50 mg by mouth daily.    [provider]  ondansetron (ZOFRAN) 4 MG tablet Take 1 tablet (4 mg total) by mouth every 8 (eight) hours as needed for nausea or vomiting. 08/19/17   Governor Rooks, MD  oxyCODONE (OXY IR/ROXICODONE) 5 MG immediate release tablet Take 1 tablet (5 mg total) by mouth every 6 (six) hours as needed for severe pain. Patient not taking: Reported on 08/19/2017 06/12/17   Juanell Fairly, MD  potassium chloride (K-DUR) 10 MEQ tablet Take 10 mEq by mouth daily. 03/13/16   [provider]  SYMBICORT 160-4.5 MCG/ACT inhaler Inhale 2 puffs into the lungs 2 (two) times daily. 03/13/16   [provider]  tiZANidine (ZANAFLEX) 4 MG tablet Take 2-4 mg by mouth every 6 (six) hours as needed for muscle spasms.  09/26/16   [provider]  topiramate (TOPAMAX) 100 MG tablet Take 100 mg by mouth at bedtime.  03/13/16   [provider]  traMADol (ULTRAM) 50 MG tablet Take 50 mg by mouth every 6 (six) hours as needed. 07/20/17   [provider]  traMADol (ULTRAM) 50 MG tablet Take 1 tablet (50 mg total) by mouth every 6 (six) hours as needed. 08/19/17   Governor Rooks, MD    Allergies  Allergen Reactions  . Bee Venom Anaphylaxis  . Penicillins Hives and Swelling    Has patient  had a PCN reaction causing immediate rash, facial/tongue/throat swelling, SOB or lightheadedness with hypotension: Yes Has patient had a PCN reaction causing severe rash involving mucus membranes or skin necrosis: No Has patient had a PCN reaction that required hospitalization No Has patient had a PCN reaction occurring within the last 10 years: No If all of the above answers are "NO", then may proceed with Cephalosporin use.    Family History  Problem Relation Age of Onset  . Breast cancer Maternal Aunt     Social History Social History  Substance Use Topics  . Smoking status: Former Smoker    Quit date: 11/17/1987  . Smokeless tobacco: Current User  Types: Snuff  . Alcohol use No    Review of Systems Constitutional: Negative for fever. Cardiovascular: Positive for chest pain, starts in the right shoulder and radiates to the chest per patient. Respiratory: Negative for shortness of breath. Gastrointestinal: Negative for abdominal pain, vomiting Musculoskeletal: Right shoulder pain that shoots into her right hand.  Pain at the base of her right thumb. Skin: Negative for rash. Neurological: Negative for headache All other ROS negative  ____________________________________________   PHYSICAL EXAM:  VITAL SIGNS: ED Triage Vitals  Enc Vitals Group     BP 09/09/17 1152 110/78     Pulse Rate 09/09/17 1152 75     Resp 09/09/17 1152 16     Temp 09/09/17 1152 98.1 F (36.7 C)     Temp Source 09/09/17 1152 Oral     SpO2 09/09/17 1152 98 %     Weight 09/09/17 1149 246 lb (111.6 kg)     Height 09/09/17 1149 5\' 5"  (1.651 m)     Head Circumference --      Peak Flow --      Pain Score --      Pain Loc --      Pain Edu? --      Excl. in GC? --     Constitutional: Alert and oriented. Well appearing and in no distress. Eyes: Normal exam ENT   Head: Normocephalic and atraumatic.   Mouth/Throat: Mucous membranes are moist. Cardiovascular: Normal rate, regular rhythm.  No murmur Respiratory: Normal respiratory effort without tachypnea nor retractions. Breath sounds are clear  Gastrointestinal: Soft and nontender. No distention.   Musculoskeletal: Moderate tenderness over the proximal thumb joint on the right hand.  Moderate pain with range of motion at the right elbow and right shoulder. Neurologic:  Normal speech and language. No gross focal neurologic deficits Skin:  Skin is warm, dry and intact.  Psychiatric: Mood and affect are normal.   ____________________________________________    EKG  EKG reviewed and interpreted by myself shows normal sinus rhythm at 74 bpm with a narrow QRS, normal axis, normal intervals, no ST changes.  Normal EKG  ____________________________________________    RADIOLOGY  X-rays normal.  ____________________________________________   INITIAL IMPRESSION / ASSESSMENT AND PLAN / ED COURSE  Pertinent labs & imaging results that were available during my care of the patient were reviewed by me and considered in my medical decision making (see chart for details).  Patient presents to the emergency department for pain ongoing for the past 3 months worse over the past 3 days which she describes as shoulder pain radiating into her right hand and occasionally into the chest.  Differential would include ACS, muscular skeletal pain, radicular pain, neuropathy, joint pain, arthritis, gout.  Patient's labs are largely within normal limits, troponin is negative.  Chest x-ray is normal.  We will obtain imaging of the right thumb.  Patient does have range of motion in the thumb but with pain.  States that this has also been ongoing for 3 months but worse over the past 3 days.  We will dose pain medication.  I discussed with the patient that she needs to see her primary care doctor and needs to see pain management for further treatment.  If her x-ray is negative we will discharge with a short course of pain medication.  Patient already  follows up with orthopedics.  Thumb x-ray appears to show likely arthritis at the base of the thumb but otherwise negative.  No fractures seen.  Patient will follow up with orthopedics.  ____________________________________________   FINAL CLINICAL IMPRESSION(S) / ED DIAGNOSES  Pain without a known cause Musculoskeletal pain    Minna AntisPaduchowski, Vera Furniss, MD 09/09/17 [phone]

## 2017-09-09 NOTE — ED Triage Notes (Signed)
Pt c/o chest pain for past few days radiating down right arm. Pt has been seen for same problem. Per family member, pt keeps getting prescribed pain medication but "they are not telling us where it is coming from." VS stable

## 2017-09-17 ENCOUNTER — Other Ambulatory Visit: Payer: Self-pay | Admitting: Orthopedic Surgery

## 2017-09-17 DIAGNOSIS — M7541 Impingement syndrome of right shoulder: Secondary | ICD-10-CM

## 2017-09-26 ENCOUNTER — Ambulatory Visit: Payer: Medicare Other

## 2017-12-31 ENCOUNTER — Other Ambulatory Visit: Payer: Self-pay | Admitting: Orthopedic Surgery

## 2017-12-31 DIAGNOSIS — M5412 Radiculopathy, cervical region: Secondary | ICD-10-CM

## 2017-12-31 DIAGNOSIS — M9907 Segmental and somatic dysfunction of upper extremity: Secondary | ICD-10-CM

## 2018-01-08 ENCOUNTER — Ambulatory Visit: Payer: Medicare Other

## 2018-01-08 ENCOUNTER — Ambulatory Visit: Admission: RE | Admit: 2018-01-08 | Payer: Medicare Other | Source: Ambulatory Visit

## 2018-01-24 ENCOUNTER — Ambulatory Visit
Admission: RE | Admit: 2018-01-24 | Discharge: 2018-01-24 | Disposition: A | Discharge status: Home / Self Care | Payer: Medicare Other | Source: Ambulatory Visit | Attending: Orthopedic Surgery | Admitting: Orthopedic Surgery

## 2018-01-24 DIAGNOSIS — M9907 Segmental and somatic dysfunction of upper extremity: Secondary | ICD-10-CM | POA: Insufficient documentation

## 2018-01-24 DIAGNOSIS — M5412 Radiculopathy, cervical region: Secondary | ICD-10-CM

## 2018-01-24 DIAGNOSIS — M19011 Primary osteoarthritis, right shoulder: Secondary | ICD-10-CM | POA: Insufficient documentation

## 2018-01-24 DIAGNOSIS — M4802 Spinal stenosis, cervical region: Secondary | ICD-10-CM | POA: Insufficient documentation

## 2018-01-24 DIAGNOSIS — M7541 Impingement syndrome of right shoulder: Secondary | ICD-10-CM

## 2018-04-01 ENCOUNTER — Other Ambulatory Visit: Payer: Self-pay | Admitting: Family Medicine

## 2018-04-01 DIAGNOSIS — Z1231 Encounter for screening mammogram for malignant neoplasm of breast: Secondary | ICD-10-CM

## 2018-04-04 IMAGING — DX DG FINGER THUMB 2+V*R*
3 series · 3 of 3 positions shown · non-contrast
Comparison: None.

CLINICAL DATA: Thumb pain for 3 days.  No known injury.

EXAM:
RIGHT THUMB 2+V

[finger ap]
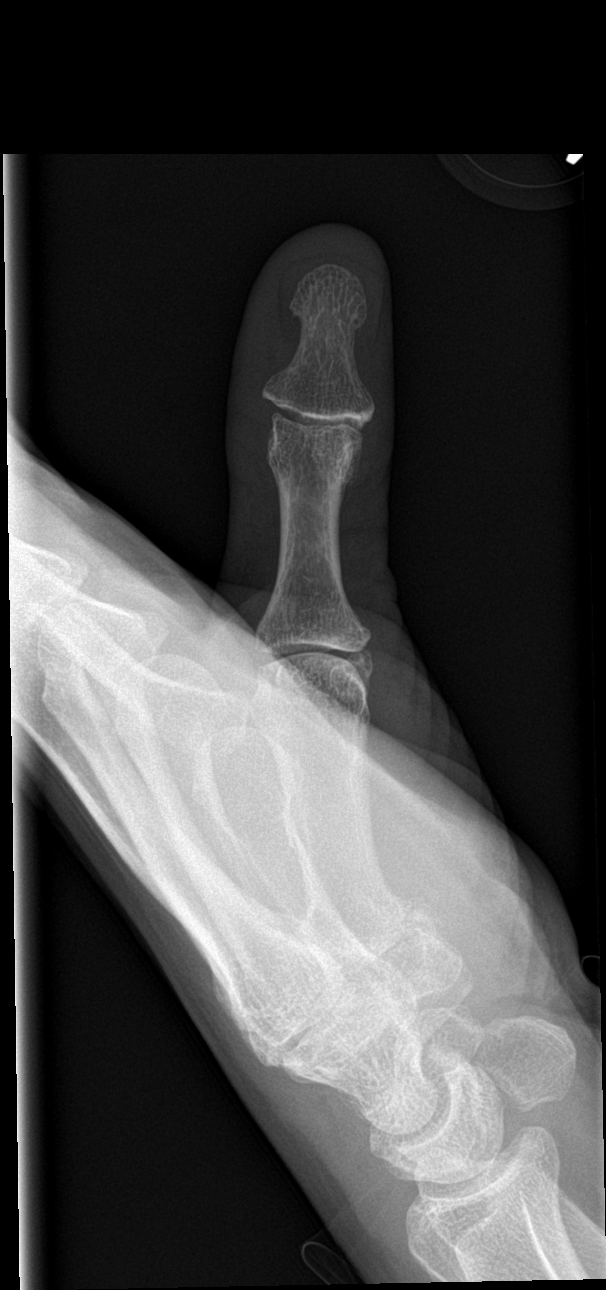

[finger obl]
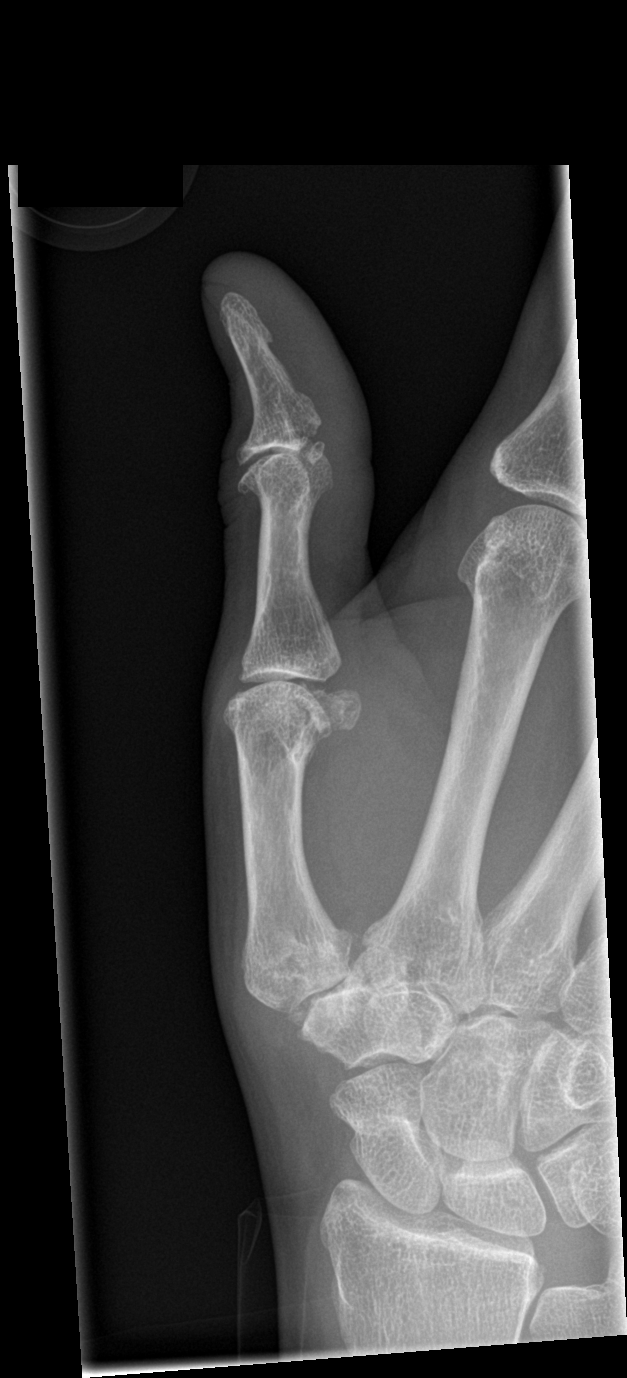

[finger lat]
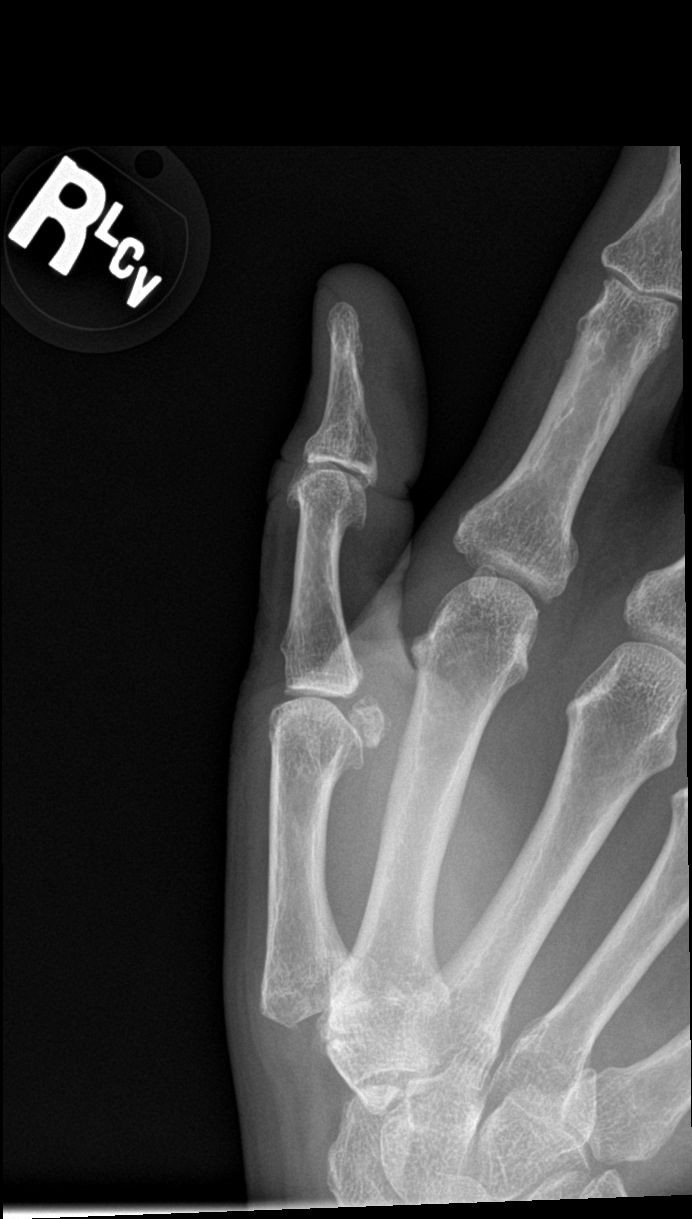

[3 of 3 positions shown; findings below may reference images not displayed]

FINDINGS: There is no evidence of fracture or dislocation. Moderate
osteoarthritis is seen involving the interphalangeal joint, and mild
osteoarthritis is seen involving the MCP and first carpal-
metacarpal joints. Soft tissues are unremarkable.
IMPRESSION: No acute findings.  Osteoarthritis.

## 2018-05-23 ENCOUNTER — Inpatient Hospital Stay: Admission: RE | Admit: 2018-05-23 | Payer: Medicare Other | Source: Ambulatory Visit

## 2018-05-23 ENCOUNTER — Other Ambulatory Visit: Payer: Self-pay | Admitting: Family Medicine

## 2018-05-23 DIAGNOSIS — R600 Localized edema: Secondary | ICD-10-CM

## 2018-05-27 ENCOUNTER — Ambulatory Visit: Payer: Medicare Other

## 2018-06-13 ENCOUNTER — Other Ambulatory Visit: Payer: Medicare Other

## 2018-06-13 ENCOUNTER — Ambulatory Visit
Admission: RE | Admit: 2018-06-13 | Discharge: 2018-06-13 | Disposition: A | Discharge status: Home / Self Care | Payer: Medicare Other | Source: Ambulatory Visit | Attending: Family Medicine | Admitting: Family Medicine

## 2018-06-13 DIAGNOSIS — Z1231 Encounter for screening mammogram for malignant neoplasm of breast: Secondary | ICD-10-CM | POA: Diagnosis not present

## 2018-07-11 ENCOUNTER — Other Ambulatory Visit: Payer: Medicare Other

## 2018-07-23 ENCOUNTER — Other Ambulatory Visit: Payer: Self-pay | Admitting: Orthopedic Surgery

## 2018-07-23 ENCOUNTER — Other Ambulatory Visit: Payer: Self-pay

## 2018-07-23 ENCOUNTER — Encounter
Admission: RE | Admit: 2018-07-23 | Discharge: 2018-07-23 | Disposition: A | Discharge status: Home / Self Care | Payer: Medicare Other | Source: Ambulatory Visit | Attending: Orthopedic Surgery | Admitting: Orthopedic Surgery

## 2018-07-23 DIAGNOSIS — I10 Essential (primary) hypertension: Secondary | ICD-10-CM | POA: Insufficient documentation

## 2018-07-23 DIAGNOSIS — Z01818 Encounter for other preprocedural examination: Secondary | ICD-10-CM | POA: Diagnosis not present

## 2018-07-23 HISTORY — DX: Anxiety disorder, unspecified: F41.9

## 2018-07-23 HISTORY — DX: Depression, unspecified: F32.A

## 2018-07-23 HISTORY — DX: Major depressive disorder, single episode, unspecified: F32.9

## 2018-07-23 LAB — BASIC METABOLIC PANEL
ANION GAP: 4 — AB (ref 5–15)
BUN: 14 mg/dL (ref 8–23)
CHLORIDE: 112 mmol/L — AB (ref 98–111)
CO2: 24 mmol/L (ref 22–32)
Calcium: 9.7 mg/dL (ref 8.9–10.3)
Creatinine, Ser: 1 mg/dL (ref 0.44–1.00)
GFR calc Af Amer: >60 mL/min (ref >60)
GFR calc non Af Amer: 58 mL/min — ABNORMAL LOW (ref >60)
Glucose, Bld: 82 mg/dL (ref 70–99)
POTASSIUM: 4.1 mmol/L (ref 3.5–5.1)
SODIUM: 140 mmol/L (ref 135–145)

## 2018-07-23 NOTE — Pre-Procedure Instructions (Signed)
Pt arrived to PAT at 0753 and was registered. Upon review of chart it was identified that patient did not have any pre op surgery orders nor was there an updated version of patients H&P since 03/2018. Called MD office LVM with Henrine Screws regarding above. Fax received 05/23/18 noting MD will address H&P DOS. Checking now for orders and still no orders in Epic.

## 2018-07-23 NOTE — Patient Instructions (Signed)
Your procedure is scheduled on: Tuesday 07/30/18  Report to DAY SURGERY DEPARTMENT LOCATED ON 2ND FLOOR MEDICAL MALL ENTRANCE. To find out your arrival time please call [phone] between 1PM - 3PM on Monday 07/29/18.  Remember: Instructions that are not followed completely may result in serious medical risk, up to and including death, or upon the discretion of your surgeon and anesthesiologist your surgery may need to be rescheduled.     _X__ 1. Do not eat food after midnight the night before your procedure.                 No gum chewing or hard candies. You may drink clear liquids up to 2 hours                 before you are scheduled to arrive for your surgery- DO not drink clear                 liquids within 2 hours of the start of your surgery.                 Clear Liquids include:  water, apple juice without pulp, clear carbohydrate                 drink such as Clearfast or Gatorade, Black Coffee or Tea (Do not add                 anything to coffee or tea).  __X__2.  On the morning of surgery brush your teeth with toothpaste and water, you                 may rinse your mouth with mouthwash if you wish.  Do not swallow any              toothpaste of mouthwash.     _X__ 3.  No Alcohol for 24 hours before or after surgery.   _X__ 4.  Do Not Smoke or use e-cigarettes For 24 Hours Prior to Your Surgery.                 Do not use any chewable tobacco products for at least 6 hours prior to                 surgery.  ____  5.  Bring all medications with you on the day of surgery if instructed.   __X__  6.  Notify your doctor if there is any change in your medical condition      (cold, fever, infections).     Do not wear jewelry, make-up, hairpins, clips or nail polish. Do not wear lotions, powders, or perfumes.  Do not shave 48 hours prior to surgery. Men may shave face and neck. Do not bring valuables to the hospital.    [facility] is not responsible for any belongings or  valuables.  Contacts, dentures/partials or body piercings may not be worn into surgery. Bring a case for your contacts, glasses or hearing aids, a denture cup will be supplied. Leave your suitcase in the car. After surgery it may be brought to your room. For patients admitted to the hospital, discharge time is determined by your treatment team.   Patients discharged the day of surgery will not be allowed to drive home.   Please read over the following fact sheets that you were given:   MRSA Information  __X__ Take these medicines the morning of surgery with A SIP OF WATER:  1. cetirizine (ZYRTEC)  2. esomeprazole (NEXIUM)  3. gabapentin (NEURONTIN)  4.  5.  6.  ____ Fleet Enema (as directed)   __X__ Use CHG Soap/SAGE wipes as directed  __X__ Use inhalers on the day of surgery  ____ Stop metformin/Janumet/Farxiga 2 days prior to surgery    ____ Take 1/2 of usual insulin dose the night before surgery. No insulin the morning          of surgery.   ____ Stop Blood Thinners Coumadin/Plavix/Xarelto/Pleta/Pradaxa/Eliquis/Effient/Aspirin  on   Or contact your Surgeon, Cardiologist or Medical Doctor regarding  ability to stop your blood thinners  __X__ Stop Anti-inflammatories 7 days before surgery such as Advil, Ibuprofen, Motrin,  BC or Goodies Powder, Naprosyn, Naproxen, Aleve, Aspirin AND MELOXICAM   __X__ Stop all herbal supplements, fish oil or vitamin E until after surgery.    ____ Bring C-Pap to the hospital.

## 2018-07-24 ENCOUNTER — Encounter
Admission: RE | Admit: 2018-07-24 | Discharge: 2018-07-24 | Disposition: A | Discharge status: Home / Self Care | Payer: Medicare Other | Source: Ambulatory Visit | Attending: Orthopedic Surgery | Admitting: Orthopedic Surgery

## 2018-07-24 DIAGNOSIS — Z01818 Encounter for other preprocedural examination: Secondary | ICD-10-CM | POA: Diagnosis not present

## 2018-07-24 LAB — CBC WITH DIFFERENTIAL/PLATELET
BASOS PCT: 2 %
Basophils Absolute: 0.1 10*3/uL (ref 0–0.1)
EOS ABS: 0.3 10*3/uL (ref 0–0.7)
Eosinophils Relative: 8 %
HCT: 37.3 % (ref 35.0–47.0)
Hemoglobin: 12.1 g/dL (ref 12.0–16.0)
LYMPHS ABS: 1.2 10*3/uL (ref 1.0–3.6)
Lymphocytes Relative: 32 %
MCH: 28 pg (ref 26.0–34.0)
MCHC: 32.5 g/dL (ref 32.0–36.0)
MCV: 86 fL (ref 80.0–100.0)
MONO ABS: 0.5 10*3/uL (ref 0.2–0.9)
MONOS PCT: 12 %
NEUTROS PCT: 46 %
Neutro Abs: 1.8 10*3/uL (ref 1.4–6.5)
PLATELETS: 291 10*3/uL (ref 150–440)
RBC: 4.34 MIL/uL (ref 3.80–5.20)
RDW: 15 % — ABNORMAL HIGH (ref 11.5–14.5)
WBC: 3.8 10*3/uL (ref 3.6–11.0)

## 2018-07-24 LAB — APTT: aPTT: 34 seconds (ref 24–36)

## 2018-07-24 LAB — HEMOGLOBIN A1C
HEMOGLOBIN A1C: 6.1 % — AB (ref 4.8–5.6)
MEAN PLASMA GLUCOSE: 128.37 mg/dL

## 2018-07-24 LAB — PROTIME-INR
INR: 0.98
Prothrombin Time: 12.9 seconds (ref 11.4–15.2)

## 2018-07-25 NOTE — Pre-Procedure Instructions (Signed)
HGB AIC FAXED TO DR Martha ClanKRASINSKI

## 2018-07-29 MED ORDER — CLINDAMYCIN PHOSPHATE 900 MG/50ML IV SOLN
900.0000 mg | INTRAVENOUS | Status: AC
  Administered 2018-07-30: 900 mg via INTRAVENOUS

## 2018-07-30 ENCOUNTER — Observation Stay
Admission: RE | Admit: 2018-07-30 | Discharge: 2018-07-31 | Disposition: A | Discharge status: Home / Self Care | Payer: Medicare Other | Source: Ambulatory Visit | Attending: Orthopedic Surgery | Admitting: Orthopedic Surgery

## 2018-07-30 ENCOUNTER — Encounter
Admission: RE | Disposition: A | Discharge status: Home / Self Care | Payer: Self-pay | Source: Ambulatory Visit | Attending: Orthopedic Surgery

## 2018-07-30 ENCOUNTER — Other Ambulatory Visit: Payer: Self-pay

## 2018-07-30 ENCOUNTER — Ambulatory Visit: Payer: Medicare Other | Admitting: Certified Registered Nurse Anesthetist

## 2018-07-30 ENCOUNTER — Encounter: Payer: Self-pay | Admitting: Anesthesiology

## 2018-07-30 DIAGNOSIS — M1990 Unspecified osteoarthritis, unspecified site: Secondary | ICD-10-CM | POA: Insufficient documentation

## 2018-07-30 DIAGNOSIS — Z91030 Bee allergy status: Secondary | ICD-10-CM | POA: Insufficient documentation

## 2018-07-30 DIAGNOSIS — N189 Chronic kidney disease, unspecified: Secondary | ICD-10-CM | POA: Insufficient documentation

## 2018-07-30 DIAGNOSIS — Z87891 Personal history of nicotine dependence: Secondary | ICD-10-CM | POA: Diagnosis not present

## 2018-07-30 DIAGNOSIS — F329 Major depressive disorder, single episode, unspecified: Secondary | ICD-10-CM | POA: Insufficient documentation

## 2018-07-30 DIAGNOSIS — Z9889 Other specified postprocedural states: Secondary | ICD-10-CM

## 2018-07-30 DIAGNOSIS — E1140 Type 2 diabetes mellitus with diabetic neuropathy, unspecified: Secondary | ICD-10-CM | POA: Insufficient documentation

## 2018-07-30 DIAGNOSIS — M19012 Primary osteoarthritis, left shoulder: Secondary | ICD-10-CM | POA: Insufficient documentation

## 2018-07-30 DIAGNOSIS — J45909 Unspecified asthma, uncomplicated: Secondary | ICD-10-CM | POA: Diagnosis not present

## 2018-07-30 DIAGNOSIS — Z7982 Long term (current) use of aspirin: Secondary | ICD-10-CM | POA: Diagnosis not present

## 2018-07-30 DIAGNOSIS — Z79899 Other long term (current) drug therapy: Secondary | ICD-10-CM | POA: Diagnosis not present

## 2018-07-30 DIAGNOSIS — I130 Hypertensive heart and chronic kidney disease with heart failure and stage 1 through stage 4 chronic kidney disease, or unspecified chronic kidney disease: Secondary | ICD-10-CM | POA: Diagnosis not present

## 2018-07-30 DIAGNOSIS — R0600 Dyspnea, unspecified: Secondary | ICD-10-CM | POA: Diagnosis not present

## 2018-07-30 DIAGNOSIS — E785 Hyperlipidemia, unspecified: Secondary | ICD-10-CM | POA: Diagnosis not present

## 2018-07-30 DIAGNOSIS — K219 Gastro-esophageal reflux disease without esophagitis: Secondary | ICD-10-CM | POA: Insufficient documentation

## 2018-07-30 DIAGNOSIS — I509 Heart failure, unspecified: Secondary | ICD-10-CM | POA: Diagnosis not present

## 2018-07-30 DIAGNOSIS — M7542 Impingement syndrome of left shoulder: Secondary | ICD-10-CM | POA: Insufficient documentation

## 2018-07-30 DIAGNOSIS — M75101 Unspecified rotator cuff tear or rupture of right shoulder, not specified as traumatic: Secondary | ICD-10-CM | POA: Diagnosis present

## 2018-07-30 DIAGNOSIS — Z8673 Personal history of transient ischemic attack (TIA), and cerebral infarction without residual deficits: Secondary | ICD-10-CM | POA: Insufficient documentation

## 2018-07-30 DIAGNOSIS — Z880 Allergy status to penicillin: Secondary | ICD-10-CM | POA: Insufficient documentation

## 2018-07-30 DIAGNOSIS — H409 Unspecified glaucoma: Secondary | ICD-10-CM | POA: Insufficient documentation

## 2018-07-30 DIAGNOSIS — I252 Old myocardial infarction: Secondary | ICD-10-CM | POA: Insufficient documentation

## 2018-07-30 DIAGNOSIS — Z87442 Personal history of urinary calculi: Secondary | ICD-10-CM | POA: Diagnosis not present

## 2018-07-30 DIAGNOSIS — F419 Anxiety disorder, unspecified: Secondary | ICD-10-CM | POA: Diagnosis not present

## 2018-07-30 DIAGNOSIS — E1122 Type 2 diabetes mellitus with diabetic chronic kidney disease: Secondary | ICD-10-CM | POA: Diagnosis not present

## 2018-07-30 DIAGNOSIS — E669 Obesity, unspecified: Secondary | ICD-10-CM | POA: Insufficient documentation

## 2018-07-30 DIAGNOSIS — M75122 Complete rotator cuff tear or rupture of left shoulder, not specified as traumatic: Secondary | ICD-10-CM | POA: Diagnosis not present

## 2018-07-30 DIAGNOSIS — I739 Peripheral vascular disease, unspecified: Secondary | ICD-10-CM | POA: Insufficient documentation

## 2018-07-30 DIAGNOSIS — Z6841 Body Mass Index (BMI) 40.0 and over, adult: Secondary | ICD-10-CM | POA: Insufficient documentation

## 2018-07-30 HISTORY — PX: SHOULDER ARTHROSCOPY WITH OPEN ROTATOR CUFF REPAIR: SHX6092

## 2018-07-30 LAB — GLUCOSE, CAPILLARY: GLUCOSE-CAPILLARY: 92 mg/dL (ref 70–99)

## 2018-07-30 SURGERY — ARTHROSCOPY, SHOULDER WITH REPAIR, ROTATOR CUFF, OPEN
Anesthesia: Choice | Site: Shoulder | Laterality: Right

## 2018-07-30 MED ORDER — MIDAZOLAM HCL 2 MG/2ML IJ SOLN
INTRAMUSCULAR | Status: DC | PRN
  Administered 2018-07-30: 2 mg via INTRAVENOUS

## 2018-07-30 MED ORDER — FENTANYL CITRATE (PF) 100 MCG/2ML IJ SOLN
INTRAMUSCULAR | Status: AC
  Filled 2018-07-30: qty 2

## 2018-07-30 MED ORDER — FENTANYL CITRATE (PF) 100 MCG/2ML IJ SOLN
25.0000 ug | INTRAMUSCULAR | Status: DC | PRN
  Administered 2018-07-30 (×4): 25 ug via INTRAVENOUS

## 2018-07-30 MED ORDER — SODIUM CHLORIDE 0.9 % IV SOLN
INTRAVENOUS | Status: DC | PRN
  Administered 2018-07-30: 50 ug/min via INTRAVENOUS

## 2018-07-30 MED ORDER — ACETAMINOPHEN 10 MG/ML IV SOLN
INTRAVENOUS | Status: DC | PRN
  Administered 2018-07-30: 1000 mg via INTRAVENOUS

## 2018-07-30 MED ORDER — FENTANYL CITRATE (PF) 100 MCG/2ML IJ SOLN
INTRAMUSCULAR | Status: AC
  Administered 2018-07-30: 25 ug via INTRAVENOUS
  Filled 2018-07-30: qty 2

## 2018-07-30 MED ORDER — ACETAMINOPHEN 500 MG PO TABS
1000.0000 mg | ORAL_TABLET | Freq: Four times a day (QID) | ORAL | Status: DC
  Administered 2018-07-30 – 2018-07-31 (×3): 1000 mg via ORAL
  Filled 2018-07-30 (×3): qty 2

## 2018-07-30 MED ORDER — ACETAMINOPHEN 325 MG PO TABS: 325.0000 mg | ORAL_TABLET | Freq: Four times a day (QID) | ORAL | Status: DC | PRN

## 2018-07-30 MED ORDER — ASPIRIN EC 81 MG PO TBEC
81.0000 mg | DELAYED_RELEASE_TABLET | Freq: Every day | ORAL | Status: DC
  Administered 2018-07-30 – 2018-07-31 (×2): 81 mg via ORAL
  Filled 2018-07-30 (×2): qty 1

## 2018-07-30 MED ORDER — ALUM & MAG HYDROXIDE-SIMETH 200-200-20 MG/5ML PO SUSP: 30.0000 mL | ORAL | Status: DC | PRN

## 2018-07-30 MED ORDER — FUROSEMIDE 40 MG PO TABS: 40.0000 mg | ORAL_TABLET | Freq: Two times a day (BID) | ORAL | Status: DC

## 2018-07-30 MED ORDER — MIDAZOLAM HCL 2 MG/2ML IJ SOLN
INTRAMUSCULAR | Status: AC
  Filled 2018-07-30: qty 2

## 2018-07-30 MED ORDER — DEXAMETHASONE SODIUM PHOSPHATE 10 MG/ML IJ SOLN
INTRAMUSCULAR | Status: AC
  Filled 2018-07-30: qty 1

## 2018-07-30 MED ORDER — BUPIVACAINE HCL (PF) 0.25 % IJ SOLN
INTRAMUSCULAR | Status: DC | PRN
  Administered 2018-07-30: 30 mL

## 2018-07-30 MED ORDER — MOMETASONE FURO-FORMOTEROL FUM 200-5 MCG/ACT IN AERO
2.0000 | INHALATION_SPRAY | Freq: Two times a day (BID) | RESPIRATORY_TRACT | Status: DC
  Administered 2018-07-30 – 2018-07-31 (×2): 2 via RESPIRATORY_TRACT
  Filled 2018-07-30: qty 8.8

## 2018-07-30 MED ORDER — BUPIVACAINE HCL (PF) 0.25 % IJ SOLN
INTRAMUSCULAR | Status: AC
  Filled 2018-07-30: qty 30

## 2018-07-30 MED ORDER — LACTATED RINGERS IV SOLN
INTRAVENOUS | Status: DC
  Administered 2018-07-30: 12:00:00 via INTRAVENOUS

## 2018-07-30 MED ORDER — PHENYLEPHRINE HCL 10 MG/ML IJ SOLN
INTRAMUSCULAR | Status: DC | PRN
  Administered 2018-07-30: 200 ug via INTRAVENOUS
  Administered 2018-07-30 (×2): 100 ug via INTRAVENOUS

## 2018-07-30 MED ORDER — NEOMYCIN-POLYMYXIN B GU 40-200000 IR SOLN
Status: DC | PRN
  Administered 2018-07-30: 2 mL

## 2018-07-30 MED ORDER — LIDOCAINE HCL (CARDIAC) PF 100 MG/5ML IV SOSY
PREFILLED_SYRINGE | INTRAVENOUS | Status: DC | PRN
  Administered 2018-07-30: 80 mg via INTRAVENOUS

## 2018-07-30 MED ORDER — DEXAMETHASONE SODIUM PHOSPHATE 10 MG/ML IJ SOLN
INTRAMUSCULAR | Status: DC | PRN
  Administered 2018-07-30: 5 mg via INTRAVENOUS

## 2018-07-30 MED ORDER — ONDANSETRON HCL 4 MG PO TABS: 4.0000 mg | ORAL_TABLET | Freq: Four times a day (QID) | ORAL | Status: DC | PRN

## 2018-07-30 MED ORDER — DARIFENACIN HYDROBROMIDE ER 15 MG PO TB24
15.0000 mg | ORAL_TABLET | Freq: Every day | ORAL | Status: DC
  Filled 2018-07-30: qty 1

## 2018-07-30 MED ORDER — TOPIRAMATE 100 MG PO TABS
100.0000 mg | ORAL_TABLET | Freq: Every day | ORAL | Status: DC
  Administered 2018-07-30: 100 mg via ORAL
  Filled 2018-07-30 (×2): qty 1

## 2018-07-30 MED ORDER — CLINDAMYCIN PHOSPHATE 600 MG/50ML IV SOLN
600.0000 mg | Freq: Four times a day (QID) | INTRAVENOUS | Status: AC
  Administered 2018-07-30 – 2018-07-31 (×3): 600 mg via INTRAVENOUS
  Filled 2018-07-30 (×4): qty 50

## 2018-07-30 MED ORDER — OXYCODONE HCL 5 MG PO TABS
5.0000 mg | ORAL_TABLET | ORAL | Status: DC | PRN
  Administered 2018-07-30 – 2018-07-31 (×3): 5 mg via ORAL
  Filled 2018-07-30 (×3): qty 1

## 2018-07-30 MED ORDER — LIDOCAINE HCL 1 % IJ SOLN
INTRAMUSCULAR | Status: DC | PRN
  Administered 2018-07-30: 10 mL

## 2018-07-30 MED ORDER — ROCURONIUM BROMIDE 50 MG/5ML IV SOLN
INTRAVENOUS | Status: AC
  Filled 2018-07-30: qty 1

## 2018-07-30 MED ORDER — BRIMONIDINE TARTRATE-TIMOLOL 0.2-0.5 % OP SOLN
1.0000 [drp] | Freq: Two times a day (BID) | OPHTHALMIC | Status: DC
  Filled 2018-07-30: qty 5

## 2018-07-30 MED ORDER — FENTANYL CITRATE (PF) 100 MCG/2ML IJ SOLN
INTRAMUSCULAR | Status: DC | PRN
  Administered 2018-07-30 (×2): 50 ug via INTRAVENOUS

## 2018-07-30 MED ORDER — PROPOFOL 10 MG/ML IV BOLUS
INTRAVENOUS | Status: DC | PRN
  Administered 2018-07-30: 150 mg via INTRAVENOUS

## 2018-07-30 MED ORDER — POTASSIUM CHLORIDE CRYS ER 10 MEQ PO TBCR: 10.0000 meq | EXTENDED_RELEASE_TABLET | Freq: Every day | ORAL | Status: DC

## 2018-07-30 MED ORDER — CLINDAMYCIN PHOSPHATE 900 MG/50ML IV SOLN
INTRAVENOUS | Status: AC
  Filled 2018-07-30: qty 50

## 2018-07-30 MED ORDER — LATANOPROST 0.005 % OP SOLN
1.0000 [drp] | Freq: Every day | OPHTHALMIC | Status: DC
  Administered 2018-07-30: 1 [drp] via OPHTHALMIC
  Filled 2018-07-30: qty 2.5

## 2018-07-30 MED ORDER — CHLORHEXIDINE GLUCONATE CLOTH 2 % EX PADS: 6.0000 | MEDICATED_PAD | Freq: Once | CUTANEOUS | Status: DC

## 2018-07-30 MED ORDER — ATORVASTATIN CALCIUM 20 MG PO TABS
40.0000 mg | ORAL_TABLET | Freq: Every day | ORAL | Status: DC
  Administered 2018-07-30: 40 mg via ORAL
  Filled 2018-07-30: qty 2

## 2018-07-30 MED ORDER — SUCCINYLCHOLINE CHLORIDE 20 MG/ML IJ SOLN
INTRAMUSCULAR | Status: DC | PRN
  Administered 2018-07-30: 100 mg via INTRAVENOUS

## 2018-07-30 MED ORDER — ONDANSETRON HCL 4 MG/2ML IJ SOLN: 4.0000 mg | Freq: Once | INTRAMUSCULAR | Status: DC | PRN

## 2018-07-30 MED ORDER — DULOXETINE HCL 60 MG PO CPEP
60.0000 mg | ORAL_CAPSULE | Freq: Every day | ORAL | Status: DC
  Administered 2018-07-30: 60 mg via ORAL
  Filled 2018-07-30 (×2): qty 1

## 2018-07-30 MED ORDER — EPINEPHRINE 0.3 MG/0.3ML IJ SOAJ: 0.3000 mg | Freq: Once | INTRAMUSCULAR | Status: DC | PRN

## 2018-07-30 MED ORDER — ACETAMINOPHEN 10 MG/ML IV SOLN
INTRAVENOUS | Status: AC
  Filled 2018-07-30: qty 100

## 2018-07-30 MED ORDER — PHENOL 1.4 % MT LIQD
1.0000 | OROMUCOSAL | Status: DC | PRN
  Filled 2018-07-30: qty 177

## 2018-07-30 MED ORDER — ONDANSETRON HCL 4 MG/2ML IJ SOLN
INTRAMUSCULAR | Status: DC | PRN
  Administered 2018-07-30: 4 mg via INTRAVENOUS

## 2018-07-30 MED ORDER — PANTOPRAZOLE SODIUM 40 MG PO TBEC
40.0000 mg | DELAYED_RELEASE_TABLET | Freq: Every day | ORAL | Status: DC
  Administered 2018-07-31: 40 mg via ORAL
  Filled 2018-07-30: qty 1

## 2018-07-30 MED ORDER — MORPHINE SULFATE (PF) 2 MG/ML IV SOLN: 2.0000 mg | INTRAVENOUS | Status: DC | PRN

## 2018-07-30 MED ORDER — ALBUTEROL SULFATE (2.5 MG/3ML) 0.083% IN NEBU: 2.5000 mg | INHALATION_SOLUTION | Freq: Four times a day (QID) | RESPIRATORY_TRACT | Status: DC | PRN

## 2018-07-30 MED ORDER — LORATADINE 10 MG PO TABS
10.0000 mg | ORAL_TABLET | Freq: Every day | ORAL | Status: DC
  Administered 2018-07-31: 10 mg via ORAL
  Filled 2018-07-30: qty 1

## 2018-07-30 MED ORDER — ROCURONIUM BROMIDE 100 MG/10ML IV SOLN
INTRAVENOUS | Status: DC | PRN
  Administered 2018-07-30: 25 mg via INTRAVENOUS
  Administered 2018-07-30: 5 mg via INTRAVENOUS

## 2018-07-30 MED ORDER — MENTHOL 3 MG MT LOZG
1.0000 | LOZENGE | OROMUCOSAL | Status: DC | PRN
  Filled 2018-07-30: qty 9

## 2018-07-30 MED ORDER — DOCUSATE SODIUM 100 MG PO CAPS
100.0000 mg | ORAL_CAPSULE | Freq: Two times a day (BID) | ORAL | Status: DC
  Administered 2018-07-30 – 2018-07-31 (×2): 100 mg via ORAL
  Filled 2018-07-30 (×2): qty 1

## 2018-07-30 MED ORDER — BRIMONIDINE TARTRATE 0.2 % OP SOLN
1.0000 [drp] | Freq: Two times a day (BID) | OPHTHALMIC | Status: DC
  Administered 2018-07-30: 1 [drp] via OPHTHALMIC
  Filled 2018-07-30: qty 5

## 2018-07-30 MED ORDER — MAGNESIUM CITRATE PO SOLN
1.0000 | Freq: Once | ORAL | Status: DC | PRN
  Filled 2018-07-30: qty 296

## 2018-07-30 MED ORDER — TRAMADOL HCL 50 MG PO TABS
50.0000 mg | ORAL_TABLET | Freq: Four times a day (QID) | ORAL | Status: DC
  Administered 2018-07-30 – 2018-07-31 (×3): 50 mg via ORAL
  Filled 2018-07-30 (×3): qty 1

## 2018-07-30 MED ORDER — TIMOLOL MALEATE 0.5 % OP SOLN
1.0000 [drp] | Freq: Two times a day (BID) | OPHTHALMIC | Status: DC
  Filled 2018-07-30: qty 5

## 2018-07-30 MED ORDER — GABAPENTIN 300 MG PO CAPS: 300.0000 mg | ORAL_CAPSULE | Freq: Three times a day (TID) | ORAL | Status: DC | PRN

## 2018-07-30 MED ORDER — LIDOCAINE HCL (PF) 2 % IJ SOLN
INTRAMUSCULAR | Status: AC
  Filled 2018-07-30: qty 10

## 2018-07-30 MED ORDER — SODIUM CHLORIDE 0.9 % IV SOLN
INTRAVENOUS | Status: DC
  Administered 2018-07-30: 18:00:00 via INTRAVENOUS

## 2018-07-30 MED ORDER — TIZANIDINE HCL 2 MG PO TABS
2.0000 mg | ORAL_TABLET | Freq: Four times a day (QID) | ORAL | Status: DC | PRN
  Filled 2018-07-30: qty 2

## 2018-07-30 MED ORDER — ONDANSETRON HCL 4 MG/2ML IJ SOLN
INTRAMUSCULAR | Status: AC
  Filled 2018-07-30: qty 2

## 2018-07-30 MED ORDER — MELOXICAM 7.5 MG PO TABS
15.0000 mg | ORAL_TABLET | Freq: Every day | ORAL | Status: DC
  Administered 2018-07-30 – 2018-07-31 (×2): 15 mg via ORAL
  Filled 2018-07-30 (×2): qty 2

## 2018-07-30 MED ORDER — EPINEPHRINE PF 1 MG/ML IJ SOLN
INTRAMUSCULAR | Status: DC | PRN
  Administered 2018-07-30: 12 mL

## 2018-07-30 MED ORDER — OXYCODONE HCL 5 MG PO TABS
10.0000 mg | ORAL_TABLET | ORAL | Status: DC | PRN
  Administered 2018-07-30: 10 mg via ORAL
  Filled 2018-07-30: qty 2

## 2018-07-30 MED ORDER — DIPHENHYDRAMINE HCL 12.5 MG/5ML PO ELIX: 12.5000 mg | ORAL_SOLUTION | ORAL | Status: DC | PRN

## 2018-07-30 MED ORDER — PROPOFOL 10 MG/ML IV BOLUS
INTRAVENOUS | Status: AC
  Filled 2018-07-30: qty 20

## 2018-07-30 MED ORDER — ONDANSETRON HCL 4 MG/2ML IJ SOLN: 4.0000 mg | Freq: Four times a day (QID) | INTRAMUSCULAR | Status: DC | PRN

## 2018-07-30 MED ORDER — BISACODYL 10 MG RE SUPP: 10.0000 mg | Freq: Every day | RECTAL | Status: DC | PRN

## 2018-07-30 MED ORDER — POLYETHYLENE GLYCOL 3350 17 G PO PACK: 17.0000 g | PACK | Freq: Every day | ORAL | Status: DC | PRN

## 2018-07-30 SURGICAL SUPPLY — 72 items
ADAPTER IRRIG TUBE 2 SPIKE SOL (ADAPTER) ×4 IMPLANT
ANCHOR ALL-SUT Q-FIX 2.8 (Anchor) ×4 IMPLANT
ANCHOR SUT 5.5 MULTIFIX (Orthopedic Implant) ×4 IMPLANT
BUR RADIUS 4.0X18.5 (BURR) ×2 IMPLANT
BUR RADIUS 5.5 (BURR) ×2 IMPLANT
CANISTER SUCT 3000ML (MISCELLANEOUS) ×2 IMPLANT
CANNULA 5.75X7 CRYSTAL CLEAR (CANNULA) ×4 IMPLANT
CANNULA PARTIAL THREAD 2X7 (CANNULA) ×2 IMPLANT
CANNULA TWIST IN 8.25X9CM (CANNULA) IMPLANT
CONNECTOR PERFECT PASSER (CONNECTOR) ×2 IMPLANT
COOLER POLAR GLACIER W/PUMP (MISCELLANEOUS) ×2 IMPLANT
CRADLE LAMINECT ARM (MISCELLANEOUS) ×2 IMPLANT
DEVICE SUCT BLK HOLE OR FLOOR (MISCELLANEOUS) ×4 IMPLANT
DRAPE IMP U-DRAPE 54X76 (DRAPES) ×4 IMPLANT
DRAPE INCISE IOBAN 66X45 STRL (DRAPES) ×2 IMPLANT
DRAPE SHEET LG 3/4 BI-LAMINATE (DRAPES) ×2 IMPLANT
DRAPE U-SHAPE 47X51 STRL (DRAPES) IMPLANT
DURAPREP 26ML APPLICATOR (WOUND CARE) ×6 IMPLANT
ELECT REM PT RETURN 9FT ADLT (ELECTROSURGICAL) ×2
ELECTRODE REM PT RTRN 9FT ADLT (ELECTROSURGICAL) ×1 IMPLANT
GAUZE PETRO XEROFOAM 1X8 (MISCELLANEOUS) ×2 IMPLANT
GAUZE SPONGE 4X4 12PLY STRL (GAUZE/BANDAGES/DRESSINGS) ×4 IMPLANT
GLOVE BIOGEL PI IND STRL 7.0 (GLOVE) ×4 IMPLANT
GLOVE BIOGEL PI IND STRL 9 (GLOVE) ×1 IMPLANT
GLOVE BIOGEL PI INDICATOR 7.0 (GLOVE) ×4
GLOVE BIOGEL PI INDICATOR 9 (GLOVE) ×1
GLOVE SURG 9.0 ORTHO LTXF (GLOVE) ×4 IMPLANT
GOWN STRL REUS TWL 2XL XL LVL4 (GOWN DISPOSABLE) ×2 IMPLANT
GOWN STRL REUS W/ TWL LRG LVL3 (GOWN DISPOSABLE) ×1 IMPLANT
GOWN STRL REUS W/ TWL LRG LVL4 (GOWN DISPOSABLE) ×1 IMPLANT
GOWN STRL REUS W/TWL LRG LVL3 (GOWN DISPOSABLE) ×1
GOWN STRL REUS W/TWL LRG LVL4 (GOWN DISPOSABLE) ×1
IV LACTATED RINGER IRRG 3000ML (IV SOLUTION) ×12
IV LR IRRIG 3000ML ARTHROMATIC (IV SOLUTION) ×12 IMPLANT
KIT STABILIZATION SHOULDER (MISCELLANEOUS) ×2 IMPLANT
KIT SUTURE 2.8 Q-FIX DISP (MISCELLANEOUS) ×2 IMPLANT
KIT SUTURETAK 3.0 INSERT PERC (KITS) IMPLANT
KIT TURNOVER KIT A (KITS) ×2 IMPLANT
MANIFOLD NEPTUNE II (INSTRUMENTS) ×2 IMPLANT
MASK FACE SPIDER DISP (MASK) ×2 IMPLANT
MAT ABSORB  FLUID 56X50 GRAY (MISCELLANEOUS) ×2
MAT ABSORB FLUID 56X50 GRAY (MISCELLANEOUS) ×2 IMPLANT
NDL SAFETY ECLIPSE 18X1.5 (NEEDLE) ×1 IMPLANT
NEEDLE HYPO 18GX1.5 SHARP (NEEDLE) ×1
NEEDLE HYPO 22GX1.5 SAFETY (NEEDLE) ×4 IMPLANT
NS IRRIG 500ML POUR BTL (IV SOLUTION) ×2 IMPLANT
PACK ARTHROSCOPY SHOULDER (MISCELLANEOUS) ×2 IMPLANT
PAD WRAPON POLAR SHDR XLG (MISCELLANEOUS) ×1 IMPLANT
PASSER SUT CAPTURE FIRST (SUTURE) ×2 IMPLANT
SET TUBE SUCT SHAVER OUTFL 24K (TUBING) ×2 IMPLANT
SET TUBE TIP INTRA-ARTICULAR (MISCELLANEOUS) ×2 IMPLANT
STRAP SAFETY 5IN WIDE (MISCELLANEOUS) ×2 IMPLANT
STRIP CLOSURE SKIN 1/2X4 (GAUZE/BANDAGES/DRESSINGS) ×4 IMPLANT
SUT ETHILON 4-0 (SUTURE) ×1
SUT ETHILON 4-0 FS2 18XMFL BLK (SUTURE) ×1
SUT LASSO 90 DEG SD STR (SUTURE) IMPLANT
SUT MNCRL 4-0 (SUTURE) ×1
SUT MNCRL 4-0 27XMFL (SUTURE) ×1
SUT PDS AB 0 CT1 27 (SUTURE) ×2 IMPLANT
SUT PERFECTPASSER WHITE CART (SUTURE) ×8 IMPLANT
SUT SMART STITCH CARTRIDGE (SUTURE) ×6 IMPLANT
SUT VIC AB 0 CT1 36 (SUTURE) ×4 IMPLANT
SUT VIC AB 2-0 CT2 27 (SUTURE) ×2 IMPLANT
SUTURE ETHLN 4-0 FS2 18XMF BLK (SUTURE) ×1 IMPLANT
SUTURE MAGNUM WIRE 2X48 BLK (SUTURE) IMPLANT
SUTURE MNCRL 4-0 27XMF (SUTURE) ×1 IMPLANT
SYR 10ML LL (SYRINGE) ×2 IMPLANT
TAPE MICROFOAM 4IN (TAPE) ×2 IMPLANT
TUBING ARTHRO INFLOW-ONLY STRL (TUBING) ×2 IMPLANT
TUBING CONNECTING 10 (TUBING) ×2 IMPLANT
WAND HAND CNTRL MULTIVAC 90 (MISCELLANEOUS) ×2 IMPLANT
WRAPON POLAR PAD SHDR XLG (MISCELLANEOUS) ×2

## 2018-07-30 NOTE — Anesthesia Post-op Follow-up Note (Signed)
Anesthesia QCDR form completed.        

## 2018-07-30 NOTE — Anesthesia Procedure Notes (Signed)
Procedure Name: Intubation Date/Time: 07/30/2018 12:56 PM Performed by: Ginger CarneMichelet, Deshaun Weisinger, CRNA Pre-anesthesia Checklist: Patient identified, Emergency Drugs available, Suction available, Patient being monitored and Timeout performed Patient Re-evaluated:Patient Re-evaluated prior to induction Oxygen Delivery Method: Circle system utilized Preoxygenation: Pre-oxygenation with 100% oxygen Induction Type: IV induction Ventilation: Mask ventilation without difficulty Laryngoscope Size: Miller and 2 Grade View: Grade I Tube type: Oral Tube size: 7.0 mm Number of attempts: 1 Airway Equipment and Method: Stylet Placement Confirmation: ETT inserted through vocal cords under direct vision,  positive ETCO2 and breath sounds checked- equal and bilateral Secured at: 21 cm Dental Injury: Teeth and Oropharynx as per pre-operative assessment

## 2018-07-30 NOTE — Anesthesia Postprocedure Evaluation (Signed)
Anesthesia Post Note  Patient: Kristine Rangel  Procedure(s) Performed: SHOULDER ARTHROSCOPY WITH OPEN ROTATOR CUFF REPAIR,SUBACROMINAL DECOMPRESSION AND DISTAL CLAVICLE EXCISION (Right Shoulder)  Patient location during evaluation: PACU Anesthesia Type: General Level of consciousness: awake and alert and oriented Pain management: pain level controlled Vital Signs Assessment: post-procedure vital signs reviewed and stable Respiratory status: spontaneous breathing Cardiovascular status: blood pressure returned to baseline Anesthetic complications: no     Last Vitals:  Vitals:   07/30/18 1005 07/30/18 1544  BP: 111/66 (!) 149/71  Pulse: 61 78  Resp: 16 (!) 21  Temp: (!) 36.4 C 37 C  SpO2: 100% 100%    Last Pain:  Vitals:   07/30/18 1544  TempSrc: Tympanic  PainSc: Asleep                 Belvie Iribe

## 2018-07-30 NOTE — Op Note (Addendum)
07/30/2018  4:07 PM  PATIENT:  Kristine Rangel  62 y.o. female  PRE-OPERATIVE DIAGNOSIS:  FULL THICKNESS ROTATOR CUFF, RIGHT SHOULDER  POST-OPERATIVE DIAGNOSIS:  FULL THICKNESS ROTATOR CUFF, SUBACROMIAL IMPINGEMENT, ACROMIOCLAVICULAR JOINT ARTHROSIS  PROCEDURE:  Procedure(s): RIGHT SHOULDER ARTHROSCOPY WITH OPEN ROTATOR CUFF REPAIR,SUBACROMINAL DECOMPRESSION AND DISTAL CLAVICLE EXCISION   SURGEON:  Surgeon(s) and Role:    * Thornton Park, MD - Primary  ANESTHESIA:   local and general   PREOPERATIVE INDICATIONS:  LUDY MESSAMORE is a  62 y.o. female with a diagnosis of right shoulder FULL THICKNESS ROTATOR CUFF, confirmed by MRI, who failed conservative treatment and elected for surgical management.    The risks benefits and alternatives were discussed with the patient preoperatively including but not limited to the risks of infection, bleeding, nerve injury, persistent pain or weakness, failure of the hardware, re-tear of the rotator cuff and the need for further surgery. Medical risks include DVT and pulmonary embolism, myocardial infarction, stroke, pneumonia, respiratory failure and death. Patient understood these risks and wished to proceed.  Patient had previously undergone a successful rotator cuff repair on her left shoulder by me.  OPERATIVE IMPLANTS: [place] Multifix anchors x 2 & Smith [employer]hew Q Fix anchors x 2  OPERATIVE FINDINGS: Previous rupture of the biceps tendon, fraying of the superior labrum, fraying of the superior aspect of the subscapularis without tear, full-thickness V-shaped tear involving the supra and infraspinatus, mild glenohumeral joint arthrosis, subacromial spurring with impingement, and advanced acromioclavicular joint arthrosis.  OPERATIVE PROCEDURE: The patient was met in the preoperative area. The right shoulder was signed with the word yes and my initials according the hospital's correct site of surgery protocol.   History and physical was  updated.  Patient was brought to the operating room where she underwent general anesthesia.  The patient was placed in a beachchair position.  A spider arm positioner was used for this case. Examination under anesthesia revealed no limitation of motion or instability with load shift testing. The patient had a negative sulcus sign.  Patient was prepped and draped in a sterile fashion. A timeout was performed to verify the patient's name, date of birth, medical record number, correct site of surgery and correct procedure to be performed there was also used to verify the patient received antibiotics that all appropriate instruments, implants and radiographs studies were available in the room. Once all in attendance were in agreement case began.  Bony landmarks were drawn out with a surgical marker along with proposed arthroscopy incisions. These were pre-injected with 1% lidocaine plain. An 11 blade was used to establish a posterior portal through which the arthroscope was placed in the glenohumeral joint. A full diagnostic examination of the shoulder was performed.  Patient was found to have a large full-thickness tear involving the supra and infraspinatus.  The arthroscope was then placed in the subacromial space.  Extensive bursitis was encountered and debrided using a 4-0 resector shaver blade and a 90 ArthroCare wand from a lateral portal which was established under direct visualization using an 18-gauge spinal needle. A subacromial decompression was also performed using a 5.5 mm resector shaver blade from the lateral portal.  The 5.5 mm resector shaver blade was also used to debride the greater tuberosity of all fibers of the rotator cuff.  The greater tuberosity was debrided until punctate bleeding was identified.  The 5.5 mm resector shaver blade was then placed to the anterior portal and a distal clavicle excision was performed.  Through the lateral portal perfect pass sutures were placed in a  side-to-side fashion to approximate the V-shaped tear.   Perfect Pass suture were then placed in the lateral border of the rotator cuff tear. All arthroscopic instruments were then removed and the mini-open portion of the procedure began.  A saber-type incision was made along the lateral border of the acromion. The deltoid muscle was identified and split in line with its fibers which allowed visualization of the rotator cuff. The Perfect Pass sutures previously placed in the lateral border of the rotator cuff were brought out through the deltoid split.  A 5.5 mm resector shaver blade was then used to debride the greater tuberosity of all torn fibers of the rotator cuff.  Two Smith and [employer] Fix anchors were placed at the articular margin of the humeral head with the greater tuberosity. The suture limbs of the Q Fix anchors were passed medially through the rotator cuff using a First Pass suture passer.   The two Perfect Pass sutures in the lateral aspect of the cuff were then anchored to the greater tuberosity footprint using two [place] Multifix fix anchors. These anchors were tensioned to allow for anatomic reduction of the rotator cuff to the greater tuberosity. The medial row sutures from the Q fix anchors were then tied down using an arthroscopic knot tying technique.  Arthroscopic images of the repair were taken with the arthroscope both externally and from inside the glenohumeral joint.  All incisions were copiously irrigated. The deltoid fascia was repaired using a 0 Vicryl suture.  The subcutaneous tissue of all incisions were closed with a 2-0 Vicryl. Skin closure for the arthroscopic incisions was performed with 4-0 nylon. The skin edges of the saber incision was approximated with a running 4-0 undyed Monocryl.  0.25% Marcaine plain was then injected into the subacromial space for postoperative pain control. A dry sterile dressing was applied.  The patient was placed in an abduction sling and  a Polar Care was applied to the shoulder.  All sharp and it instrument counts were correct at the conclusion of the case. I was scrubbed and present for the entire case. I spoke with the patient's care giver postoperatively to let her know the case had gone without complication and the patient was stable in recovery room.

## 2018-07-30 NOTE — H&P (Signed)
PREOPERATIVE H&P  Chief Complaint:  RIGHT SHOULDER ROTATOR CUFF TEAR  HPI: Kristine Rangel is a 62 y.o. female who presents for preoperative history and physical with a diagnosis of RIGHT SHOULDER ROTATOR CUFF TEAR. Symptoms are significantly impairing activities of daily living.  She has elected for surgical management.  Patient is undergone successful left shoulder rotator cuff repair by me in the past.  Past Medical History:  Diagnosis Date  . Anxiety   . Arthritis   . Asthma   . Bladder leak   . Cerebral aneurysm    history-has coils  . CHF (congestive heart failure) (HCC)   . Chronic kidney disease   . Depression   . Diabetes mellitus without complication (HCC)   . Dyspnea   . GERD (gastroesophageal reflux disease)   . Glaucoma   . Headache   . History of kidney stones   . Hyperlipidemia   . Hypertension   . MI (myocardial infarction) (HCC)    unsure when  . Neuropathy   . Rotator cuff injury   . Seasonal allergies   . Stroke [facility](HCC)    TIA's   Past Surgical History:  Procedure Laterality Date  . BRAIN SURGERY Right 2010   anuerysm repair  . CARPAL TUNNEL RELEASE Right 06/12/2017   Procedure: CARPAL TUNNEL RELEASE;  Surgeon: Juanell FairlyKrasinski, Trentan Trippe, MD;  Location: [facility] ORS;  Service: Orthopedics;  Laterality: Right;  . CHOLECYSTECTOMY    . JOINT REPLACEMENT Bilateral    TOTAL KNEE REPLACEMENT  . SHOULDER ARTHROSCOPY WITH OPEN ROTATOR CUFF REPAIR Left 11/23/2016   Procedure: SHOULDER ARTHROSCOPY WITH OPEN ROTATOR CUFF REPAIR, ARTHROSCOPIC SUBACROMIAL DECOMPRESSION, DISTAL CLAVICLE EXCISION;  Surgeon: Juanell FairlyKevin Jahmeek Shirk, MD;  Location: [facility] ORS;  Service: Orthopedics;  Laterality: Left;   Social History   Socioeconomic History  . Marital status: Single    Spouse name: Not on file  . Number of children: Not on file  . Years of education: Not on file  . Highest education level: Not on file  Occupational History  . Not on file  Social Needs  . Financial resource strain: Not  on file  . Food insecurity:    Worry: Not on file    Inability: Not on file  . Transportation needs:    Medical: Not on file    Non-medical: Not on file  Tobacco Use  . Smoking status: Former Smoker    Last attempt to quit: 11/17/1987    Years since quitting: 30.7  . Smokeless tobacco: Current User    Types: Snuff  Substance and Sexual Activity  . Alcohol use: No  . Drug use: No  . Sexual activity: Not on file  Lifestyle  . Physical activity:    Days per week: Not on file    Minutes per session: Not on file  . Stress: Not on file  Relationships  . Social connections:    Talks on phone: Not on file    Gets together: Not on file    Attends religious service: Not on file    Active member of club or organization: Not on file    Attends meetings of clubs or organizations: Not on file    Relationship status: Not on file  Other Topics Concern  . Not on file  Social History Narrative  . Not on file   Family History  Problem Relation Age of Onset  . Breast cancer Maternal Aunt    Allergies  Allergen Reactions  . Bee Venom Anaphylaxis  . Penicillins  Hives, Swelling and Other (See Comments)    Has patient had a PCN reaction causing immediate rash, facial/tongue/throat swelling, SOB or lightheadedness with hypotension: Yes Has patient had a PCN reaction causing severe rash involving mucus membranes or skin necrosis: No Has patient had a PCN reaction that required hospitalization No Has patient had a PCN reaction occurring within the last 10 years: No If all of the above answers are "NO", then may proceed with Cephalosporin use.   Prior to Admission medications   Medication Sig Start Date End Date Taking? Authorizing Provider  albuterol (PROVENTIL) (2.5 MG/3ML) 0.083% nebulizer solution Take 2.5 mg by nebulization every 6 (six) hours as needed for wheezing or shortness of breath.  03/16/16  Yes [provider]  aspirin EC 81 MG tablet Take 81 mg by mouth daily.   Yes  [provider]  atorvastatin (LIPITOR) 40 MG tablet Take 40 mg by mouth at bedtime. 03/13/16  Yes [provider]  brimonidine-timolol (COMBIGAN) 0.2-0.5 % ophthalmic solution Place 1 drop into both eyes 2 (two) times daily.   Yes [provider]  cetirizine (ZYRTEC) 10 MG tablet Take 10 mg by mouth daily.   Yes [provider]  DULoxetine (CYMBALTA) 60 MG capsule Take 60 mg by mouth at bedtime.  03/13/16  Yes [provider]  EPINEPHrine 0.3 mg/0.3 mL IJ SOAJ injection Inject 0.3 mg into the skin once as needed (allergic reactions).    Yes [provider]  esomeprazole (NEXIUM) 40 MG capsule Take 40 mg by mouth daily. 03/17/16  Yes [provider]  furosemide (LASIX) 40 MG tablet Take 40 mg by mouth 2 (two) times daily.    Yes [provider]  gabapentin (NEURONTIN) 300 MG capsule Take 300 mg by mouth 3 (three) times daily as needed (pain).   Yes [provider]  latanoprost (XALATAN) 0.005 % ophthalmic solution Place 1 drop into both eyes at bedtime.   Yes [provider]  meloxicam (MOBIC) 15 MG tablet Take 1 tablet (15 mg total) by mouth daily. 10/12/15  Yes Beers, Charmayne Sheer, PA-C  potassium chloride (K-DUR) 10 MEQ tablet Take 10 mEq by mouth daily. 03/13/16  Yes [provider]  solifenacin (VESICARE) 10 MG tablet Take 10 mg by mouth daily.   Yes [provider]  SYMBICORT 160-4.5 MCG/ACT inhaler Inhale 2 puffs into the lungs 2 (two) times daily. 03/13/16  Yes [provider]  tiZANidine (ZANAFLEX) 4 MG tablet Take 2-4 mg by mouth every 6 (six) hours as needed for muscle spasms.  09/26/16  Yes [provider]  topiramate (TOPAMAX) 100 MG tablet Take 100 mg by mouth at bedtime.  03/13/16  Yes [provider]     Positive ROS: All other systems have been reviewed and were otherwise negative with the exception of those mentioned in the HPI and as above.  Physical  Exam: General: Alert, no acute distress Cardiovascular: Regular rate and rhythm, no murmurs rubs or gallops.  No pedal edema Respiratory: Clear to auscultation bilaterally, no wheezes rales or rhonchi. No cyanosis, no use of accessory musculature GI: No organomegaly, abdomen is soft and non-tender nondistended with positive bowel sounds. Skin: Skin intact, no lesions within the operative field. Neurologic: Sensation intact distally Psychiatric: Patient is competent for consent with normal mood and affect Lymphatic: No cervical lymphadenopathy  MUSCULOSKELETAL: Right upper extremity: Patient skin is intact.  There is no erythema, ecchymosis, swelling or deformity.  Patient has limitation of forward elevation and  abduction to 90 degrees.  She demonstrates pain and weakness to a downward directed force on her abducted shoulder.  She has weakness to external rotation.  Patient has full digital wrist and elbow range of motion, intact sensation to touch and a palpable radial pulse.  Assessment: RIGHT SHOULDER ROTATOR CUFF TEAR  Plan: Plan for Procedure(s): RIGHT SHOULDER ARTHROSCOPY WITH MINI-OPEN ROTATOR CUFF REPAIR  I have reviewed the details of the operation as well as the postoperative course is reviewed with the patient.  She is familiar with the procedure and postop course from her prior surgery.  I discussed the risks and benefits of surgery. The patient understands the risks include but are not limited to infection, bleeding, nerve or blood vessel injury, joint stiffness or loss of motion, persistent pain, weakness or instability, retear of the rotator cuff and hardware failure and the need for further surgery. Medical risks include but are not limited to DVT and pulmonary embolism, myocardial infarction, stroke, pneumonia, respiratory failure and death. Patient understood these risks and wished to proceed.    Juanell Fairly, MD   07/30/2018 12:35 PM

## 2018-07-30 NOTE — Transfer of Care (Signed)
Immediate Anesthesia Transfer of Care Note  Patient: Kristine Rangel  Procedure(s) Performed: SHOULDER ARTHROSCOPY WITH OPEN ROTATOR CUFF REPAIR,SUBACROMINAL DECOMPRESSION AND DISTAL CLAVICLE EXCISION (Right Shoulder)  Patient Location: PACU  Anesthesia Type:General  Level of Consciousness: awake and alert   Airway & Oxygen Therapy: Patient Spontanous Breathing and Patient connected to face mask oxygen  Post-op Assessment: Report given to RN and Post -op Vital signs reviewed and stable  Post vital signs: Reviewed and stable  Last Vitals:  Vitals Value Taken Time  BP 149/71 07/30/2018  3:44 PM  Temp 37 C 07/30/2018  3:44 PM  Pulse 77 07/30/2018  3:45 PM  Resp 21 07/30/2018  3:45 PM  SpO2 100 % 07/30/2018  3:45 PM  Vitals shown include unvalidated device data.  Last Pain:  Vitals:   07/30/18 1544  TempSrc: Tympanic  PainSc:          Complications: No apparent anesthesia complications

## 2018-07-30 NOTE — Anesthesia Preprocedure Evaluation (Signed)
Anesthesia Evaluation  Patient identified by MRN, date of birth, ID band Patient awake    Reviewed: Allergy & Precautions, NPO status , Patient's Chart, lab work & pertinent test results  History of Anesthesia Complications Negative for: history of anesthetic complications  Airway Mallampati: III  TM Distance: <3 FB Neck ROM: Full    Dental no notable dental hx.    Pulmonary shortness of breath and with exertion, asthma , neg sleep apnea, neg COPD, former smoker,    Pulmonary exam normal breath sounds clear to auscultation- rhonchi (-) wheezing      Cardiovascular hypertension, (-) angina+ Past MI, + Peripheral Vascular Disease and +CHF  Normal cardiovascular exam Rhythm:Regular Rate:Normal - Systolic murmurs and - Diastolic murmurs Echo 06/02/15: NORMAL LEFT VENTRICULAR SYSTOLIC FUNCTION WITH MILD LVH MODERATE VALVULAR REGURGITATION (See above) NO VALVULAR STENOSIS MILD PHTN Normal overall left ventricular function ejection fraction greater than 55%  NM stress test 06/02/15: negative for ischemia   Neuro/Psych  Headaches, PSYCHIATRIC DISORDERS Anxiety Depression TIACVA (R sided weakness), Residual Symptoms negative psych ROS   GI/Hepatic Neg liver ROS, GERD  ,  Endo/Other  diabetes, Well Controlled, Type 2, Oral Hypoglycemic Agents  Renal/GU Renal InsufficiencyRenal diseasenegative Renal ROS     Musculoskeletal  (+) Arthritis ,   Abdominal Normal abdominal exam  (+) + obese,   Peds negative pediatric ROS (+)  Hematology negative hematology ROS (+)   Anesthesia Other Findings Past Medical History: No date: Arthritis No date: Asthma No date: Bladder leak No date: Cerebral aneurysm     Comment: history-has coils No date: CHF (congestive heart failure) (HCC) No date: Chronic kidney disease No date: Diabetes mellitus without complication (HCC) No date: Dyspnea No date: GERD (gastroesophageal reflux  disease) No date: Glaucoma No date: Headache No date: Hyperlipidemia No date: Hypertension No date: MI (myocardial infarction) No date: Neuropathy (HCC) No date: Rotator cuff injury No date: Seasonal allergies No date: Stroke [facility](HCC)     Comment: TIA's   Reproductive/Obstetrics                             Anesthesia Physical  Anesthesia Plan  ASA: III  Anesthesia Plan: General   Post-op Pain Management:    Induction: Intravenous  PONV Risk Score and Plan:   Airway Management Planned: Oral ETT  Additional Equipment:   Intra-op Plan:   Post-operative Plan: Extubation in OR  Informed Consent: I have reviewed the patients History and Physical, chart, labs and discussed the procedure including the risks, benefits and alternatives for the proposed anesthesia with the patient or authorized representative who has indicated his/her understanding and acceptance.   Dental advisory given  Plan Discussed with: CRNA and Anesthesiologist  Anesthesia Plan Comments:         Anesthesia Quick Evaluation

## 2018-07-31 ENCOUNTER — Encounter: Payer: Self-pay | Admitting: Orthopedic Surgery

## 2018-07-31 DIAGNOSIS — M75122 Complete rotator cuff tear or rupture of left shoulder, not specified as traumatic: Secondary | ICD-10-CM | POA: Diagnosis not present

## 2018-07-31 LAB — BASIC METABOLIC PANEL
Anion gap: 6 (ref 5–15)
BUN: 15 mg/dL (ref 8–23)
CALCIUM: 9 mg/dL (ref 8.9–10.3)
CO2: 21 mmol/L — AB (ref 22–32)
CREATININE: 1.05 mg/dL — AB (ref 0.44–1.00)
Chloride: 112 mmol/L — ABNORMAL HIGH (ref 98–111)
GFR calc Af Amer: >60 mL/min (ref >60)
GFR, EST NON AFRICAN AMERICAN: 55 mL/min — AB (ref >60)
GLUCOSE: 155 mg/dL — AB (ref 70–99)
Potassium: 3.9 mmol/L (ref 3.5–5.1)
Sodium: 139 mmol/L (ref 135–145)

## 2018-07-31 LAB — CBC
HEMATOCRIT: 36.2 % (ref 35.0–47.0)
Hemoglobin: 11.8 g/dL — ABNORMAL LOW (ref 12.0–16.0)
MCH: 28.1 pg (ref 26.0–34.0)
MCHC: 32.7 g/dL (ref 32.0–36.0)
MCV: 85.8 fL (ref 80.0–100.0)
PLATELETS: 262 10*3/uL (ref 150–440)
RBC: 4.22 MIL/uL (ref 3.80–5.20)
RDW: 15.7 % — AB (ref 11.5–14.5)
WBC: 8.4 10*3/uL (ref 3.6–11.0)

## 2018-07-31 MED ORDER — OXYCODONE HCL 5 MG PO TABS: 5.0000 mg | ORAL_TABLET | ORAL | 0 refills | Status: DC | PRN

## 2018-07-31 MED ORDER — DOCUSATE SODIUM 100 MG PO CAPS: 100.0000 mg | ORAL_CAPSULE | Freq: Two times a day (BID) | ORAL | 0 refills | Status: DC

## 2018-07-31 NOTE — Progress Notes (Signed)
Physical Therapy Treatment Patient Details Name: Kristine DrossCathy A Peruski MRN: [id number] DOB: [birth year] Today's Date: 07/31/2018    History of Present Illness 62yo female pt s/p R shoulder arthroscopy w/ open RTC repair, subacrominal decompression and distal clavicle exision 07/30/2018. PMHx includes L RTC repair, B TKR, PVD, CHF, MI, CVA (R side weakness), anxiety, depression, DM2, CKD, glaucoma, HTN, HLD, and TIAs.     PT Comments    Pt is pleasant on arrival and ready to work with PT. Pt performed ther-ex with VC for technique, requires frequent short rest breaks. Pt has improved amb, less unsteadiness present and slightly longer distance ambulated. Still requires seated rest break midway. Pt performed toileting mobility, requires physical assist to dress. Education given on how to safely ambulate in the home, importance of only ambulating short distances and very gradually progressing. Will continue to progress strength and endurance as able.  Follow Up Recommendations  Home health PT;Supervision/Assistance - 24 hour     Equipment Recommendations  None recommended by PT    Recommendations for Other Services       Precautions / Restrictions Precautions Precautions: Fall;Shoulder Type of Shoulder Precautions: NO ROM of shoulder, sling/immobilizer on at all times except ADL and exercises, RUE NWBing, elbow/wrist/hand AROM okay Shoulder Interventions: Shoulder sling/immobilizer;Shoulder abduction pillow;At all times;Off for dressing/bathing/exercises Precaution Booklet Issued: Yes (comment) Restrictions Weight Bearing Restrictions: Yes RUE Weight Bearing: Non weight bearing    Mobility  Bed Mobility               General bed mobility comments: Pt recieved sitting in chair, did not assess bed mobility  Transfers Overall transfer level: Needs assistance Equipment used: Straight cane Transfers: Sit to/from Stand Sit to Stand: Min guard         General transfer comment:  Performed with SPC and CGA, increased effort/time needed.  Ambulation/Gait Ambulation/Gait assistance: Min guard;+2 safety/equipment Gait Distance (Feet): 35 Feet Assistive device: Straight cane Gait Pattern/deviations: Decreased step length - right;Decreased step length - left;Decreased stride length     General Gait Details: Pt has B dec step length, foot clearance, and very slow gait. Pt continues to fatigue quickly and required seated rest break halfway through amb. Ambulation still requires full effort.   Stairs             Wheelchair Mobility    Modified Rankin (Stroke Patients Only)       Balance Overall balance assessment: Needs assistance Sitting-balance support: No upper extremity supported;Feet supported Sitting balance-Leahy Scale: Fair     Standing balance support: Single extremity supported;During functional activity Standing balance-Leahy Scale: Fair                              Cognition Arousal/Alertness: Awake/alert Behavior During Therapy: WFL for tasks assessed/performed Overall Cognitive Status: Within Functional Limits for tasks assessed                                        Exercises Other Exercises Other Exercises: Pt performed toileting activities and then transfered to chair. Pt required physical assist for dressing and supervision for balance throughout Other Exercises: Seated BLE glute sets, quad sets, hip ABD/ABD, SLRs, and marching. All ther-ex performed 12 x reps with supervision and VC for technique.    General Comments General comments (skin integrity, edema, etc.): Polar care and sling in place  at start and end of session.      Pertinent Vitals/Pain Pain Assessment: 0-10 Pain Score: 9  Pain Location: R shoulder Pain Descriptors / Indicators: Grimacing;Guarding;Throbbing Pain Intervention(s): Limited activity within patient's tolerance;Monitored during session    Home Living                       Prior Function            PT Goals (current goals can now be found in the care plan section) Acute Rehab PT Goals Patient Stated Goal: to go home and recover PT Goal Formulation: With patient Time For Goal Achievement: 08/14/18 Potential to Achieve Goals: Fair Progress towards PT goals: Progressing toward goals    Frequency    BID      PT Plan Current plan remains appropriate    Co-evaluation              AM-PAC PT "6 Clicks" Daily Activity  Outcome Measure  Difficulty turning over in bed (including adjusting bedclothes, sheets and blankets)?: Unable Difficulty moving from lying on back to sitting on the side of the bed? : Unable Difficulty sitting down on and standing up from a chair with arms (e.g., wheelchair, bedside commode, etc,.)?: Unable Help needed moving to and from a bed to chair (including a wheelchair)?: A Little Help needed walking in hospital room?: A Little Help needed climbing 3-5 steps with a railing? : Total 6 Click Score: 10    End of Session Equipment Utilized During Treatment: Gait belt Activity Tolerance: Patient limited by fatigue;Patient limited by pain Patient left: in chair;with call bell/phone within reach;with chair alarm set;with nursing/sitter in room Nurse Communication: Mobility status PT Visit Diagnosis: Unsteadiness on feet (R26.81);Other abnormalities of gait and mobility (R26.89);Muscle weakness (generalized) (M62.81)     Time: 4540-9811 PT Time Calculation (min) (ACUTE ONLY): 31 min  Charges:  $Gait Training: 8-22 mins $Therapeutic Exercise: 8-22 mins                     Arvilla Meres, SPT    Arvilla Meres 07/31/2018, 3:59 PM

## 2018-07-31 NOTE — Care Management Note (Signed)
Case Management Note  Patient Details  Name: Kristine Rangel MRN: [id number] Date of Birth: 04/07/1956  Subjective/Objective:     Admitted to [facility] under observation status with the diagnosis of rotator cuff repair. Lives alone. Friend is Cherlyn LabellaShirley Royster,  [facility] in the past. No skilled nursing. No home oxygen. Personal care services per [place] currently. Bedside commode, rolling walker and shower chair in the home. Drives, but has no car. No falls. Demetrios LollFriend, Shirley Royster will transport              Action/Plan: Wants [facility] again. Will update Mellissa Kohuteresa Cooper, [facility] representative   Expected Discharge Date:  07/31/18               Expected Discharge Plan:     In-House Referral:     Discharge planning Services     Post Acute Care Choice:    Choice offered to:     DME Arranged:    DME Agency:     HH Arranged:   yes HH Agency:   [facility]  Status of Service:     If discussed at [employer] Meetings, dates discussed:    Additional Comments:  Gwenette GreetBrenda S Landrie Beale, RN MSN CCM Care Management [phone] 07/31/2018, 11:16 AM

## 2018-07-31 NOTE — Care Management Important Message (Signed)
Important Message  Patient Details  Name: Kristine Rangel MRN: [id number] Date of Birth: 12-Jun-1956   Medicare Important Message Given:   yes yes    Gwenette GreetBrenda S Kelen Laura, RN 07/31/2018, 10:16 AM

## 2018-07-31 NOTE — Evaluation (Signed)
Occupational Therapy Evaluation Patient Details Name: Kristine Rangel MRN: [id number] DOB: 02-Nov-1956 Today's Date: 07/31/2018    History of Present Illness 62yo female pt s/p R shoulder arthroscopy w/ open RTC repair, subacrominal decompression and distal clavicle exision 07/30/2018. PMHx includes L RTC repair, B TKR, PVD, CHF, MI, CVA (R side weakness), anxiety, depression, DM2, CKD, glaucoma, HTN, HLD, and TIAs.    Clinical Impression   Patient was seen for an OT evaluation this date, POD#1 above surgery. Pt lives alone in an entry level apartment. Pt has a PCA daily who assists with bathing, dressing, toileting as needed, housekeeping, and meals. Pt currently doesn't have a car but is capable of driving. Prior to surgery, pt was not very physically active, denies falls, and enjoys doing word puzzles. Pt plans to have grandson stay with her overnight for a while and will continue to have her PCA during the day while recovering. Pt notes that PCA has been with her ~10 years and assisted the pt after her previous L RTC repair. Pt has orders for RUE to be immobilized and will be NWBing per MD. Patient presents with impaired strength/ROM, pain, balance, and impaired functional use of RUE (dominant). These impairments result in a decreased ability to perform self care tasks requiring mod-max assist for UB dressing and bathing and max assist for LB ADL, application of polar care, and sling/immobilizer. Pt instructed in polar care mgt, sling/immobilizer mgt, ROM exercises for RUE elbow/wrist/hand (with instructions for NO shoulder exercises until instructed otherwise), RUE precautions, adaptive strategies for bathing/dressing/toileting/grooming, positioning and considerations for sleep, and home/routines modifications to maximize falls prevention, safety, and independence. Handout provided. OT adjusted sling/immobilizer and polar care to improve comfort, optimize positioning, and to maximize skin  integrity/safety. Pt verbalized understanding of all education/training provided. Pt will benefit from skilled OT services to address these limitations and improve independence in daily tasks. Recommend HHOT services to continue therapy to maximize return to PLOF, address home/routines modifications and safety, minimize falls risk, and minimize caregiver burden.     Follow Up Recommendations  Home health OT;Follow surgeon's recommendation for DC plan and follow-up therapies;Supervision - Intermittent    Equipment Recommendations  None recommended by OT    Recommendations for Other Services       Precautions / Restrictions Precautions Precautions: Fall;Shoulder Type of Shoulder Precautions: NO ROM of shoulder, sling/immobilizer on at all times except ADL and exercises, RUE NWBing, elbow/wrist/hand AROM okay Shoulder Interventions: Shoulder sling/immobilizer;Shoulder abduction pillow;At all times;Off for dressing/bathing/exercises Restrictions Weight Bearing Restrictions: Yes RUE Weight Bearing: Non weight bearing      Mobility Bed Mobility Overal bed mobility: Needs Assistance Bed Mobility: Supine to Sit     Supine to sit: HOB elevated;Min guard     General bed mobility comments: additional time/effort to get out to R side  Transfers Overall transfer level: Needs assistance Equipment used: 1 person hand held assist Transfers: Sit to/from Stand Sit to Stand: Min assist;Min guard         General transfer comment: initial min assist decreasing to CGA with subsequent transfers, handheld assist on L side    Balance Overall balance assessment: Needs assistance Sitting-balance support: No upper extremity supported;Feet supported Sitting balance-Leahy Scale: Fair     Standing balance support: Single extremity supported;During functional activity Standing balance-Leahy Scale: Fair                             ADL either performed  or assessed with clinical  judgement   ADL Overall ADL's : Needs assistance/impaired Eating/Feeding: Sitting;Set up   Grooming: Sitting;Minimal assistance;With caregiver independent assisting   Upper Body Bathing: Sitting;Moderate assistance;With caregiver independent assisting   Lower Body Bathing: Sit to/from stand;Maximal assistance;With caregiver independent assisting   Upper Body Dressing : Sitting;Moderate assistance;With caregiver independent assisting;Maximal assistance   Lower Body Dressing: Sit to/from stand;Maximal assistance;With caregiver independent assisting   Toilet Transfer: Ambulation;BSC;Min guard   Toileting- Architect and Hygiene: Sit to/from stand;Min guard;Set up               Vision Baseline Vision/History: Wears glasses Wears Glasses: At all times Patient Visual Report: No change from baseline       Perception     Praxis      Pertinent Vitals/Pain Pain Assessment: 0-10 Pain Score: 9  Pain Location: R shoulder Pain Descriptors / Indicators: Sharp;Aching;Grimacing;Guarding Pain Intervention(s): Limited activity within patient's tolerance;Monitored during session;Repositioned;Ice applied;RN gave pain meds during session     Hand Dominance Right   Extremity/Trunk Assessment Upper Extremity Assessment Upper Extremity Assessment: RUE deficits/detail(LUE grossly WFL) RUE Deficits / Details: intact sensation, unable to fully assess 2/2 pain/immobilization RUE: Unable to fully assess due to immobilization;Unable to fully assess due to pain RUE Sensation: WNL RUE Coordination: decreased gross motor   Lower Extremity Assessment Lower Extremity Assessment: Defer to PT evaluation;Generalized weakness   Cervical / Trunk Assessment Cervical / Trunk Assessment: Normal   Communication Communication Communication: No difficulties   Cognition Arousal/Alertness: Awake/alert Behavior During Therapy: WFL for tasks assessed/performed Overall Cognitive Status: Within  Functional Limits for tasks assessed                                     General Comments  polar care and sling in place at start and end of session    Exercises Other Exercises Other Exercises: pt instructed in hemi techniques for UB dressing and bathing, adaptive techniques for underarm grooming/bathing on R side, and safety strategies for LB ADL Other Exercises: pt instructed in polar care and shoulder sling/immobilizer mgt including donning/doffing, wear schedule, and positioning Other Exercises: pt educated in positioning for RUE while sleeping - pt plans to sleep in recliner, OT positioned pillow to minimize risk of shoulder extension Other Exercises: pt educated in precautions and ex - RUE NWBing, AROM elbow/wrist/hand is okay, NO shoulder ROM   Shoulder Instructions      Home Living Family/patient expects to be discharged to:: Private residence Living Arrangements: Alone Available Help at Discharge: Family;Personal care attendant Type of Home: Apartment Home Access: Level entry     Home Layout: One level     Bathroom Shower/Tub: Chief Strategy Officer: Standard     Home Equipment: Environmental consultant - 4 wheels;Cane - single point;Tub bench;Toilet riser;Grab bars - toilet;Grab bars - tub/shower;Adaptive equipment;Bedside commode Adaptive Equipment: Reacher        Prior Functioning/Environment Level of Independence: Needs assistance  Gait / Transfers Assistance Needed: pt ambulates primarily with SPC, sometimes rollator, denies falls in past 12 months ADL's / Homemaking Assistance Needed: Pt has PCA who assists daily ~8am-5pm with ADL, meals, housekeeping; indep with med mgt; family assists with transportation (pt doesn't have a car, but can drive)            OT Problem List: Decreased strength;Decreased knowledge of use of DME or AE;Decreased range of motion;Decreased knowledge of precautions;Decreased  activity tolerance;Impaired UE functional  use;Pain;Impaired balance (sitting and/or standing)      OT Treatment/Interventions: Self-care/ADL training;Balance training;Therapeutic exercise;Therapeutic activities;DME and/or AE instruction;Patient/family education    OT Goals(Current goals can be found in the care plan section) Acute Rehab OT Goals Patient Stated Goal: to go home and recover OT Goal Formulation: With patient Time For Goal Achievement: 08/14/18 Potential to Achieve Goals: Good ADL Goals Pt Will Perform Upper Body Dressing: with mod assist;with caregiver independent in assisting;with min assist;sitting Pt Will Transfer to Toilet: with supervision;ambulating;grab bars(comfort height toilet, LRAD for ambulation) Additional ADL Goal #1: Pt will independently instruct caregiver in polar care mgt including donning/doffing, wear schedule, and positioning. Additional ADL Goal #2: Pt will independently instruct caregiver in shoulder sling/immobilizer mgt including donning/doffing, wear schedule, and positioning.  OT Frequency: Min 1X/week   Barriers to D/C:            Co-evaluation              AM-PAC PT "6 Clicks" Daily Activity     Outcome Measure Help from another person eating meals?: A Little Help from another person taking care of personal grooming?: A Little Help from another person toileting, which includes using toliet, bedpan, or urinal?: A Little Help from another person bathing (including washing, rinsing, drying)?: A Lot Help from another person to put on and taking off regular upper body clothing?: A Lot Help from another person to put on and taking off regular lower body clothing?: A Lot 6 Click Score: 15   End of Session Equipment Utilized During Treatment: Gait belt  Activity Tolerance: Patient tolerated treatment well Patient left: in chair;with call bell/phone within reach;with chair alarm set;Other (comment)(sling and polar care in place)  OT Visit Diagnosis: Other abnormalities of gait and  mobility (R26.89);Muscle weakness (generalized) (M62.81);Pain Pain - Right/Left: Right Pain - part of body: Shoulder                Time: [id number] OT Time Calculation (min): 42 min Charges:  OT General Charges $OT Visit: 1 Visit OT Evaluation $OT Eval Moderate Complexity: 1 Mod OT Treatments $Self Care/Home Management : 23-37 mins  Richrd PrimeJamie Stiller, MPH, MS, OTR/L ascom [phone] 07/31/18, 9:45 AM

## 2018-07-31 NOTE — Progress Notes (Signed)
Physical Therapy Evaluation Patient Details Name: Kristine Rangel MRN: [id number] DOB: [birth year] Today's Date: 07/31/2018   History of Present Illness  62yo female pt s/p R shoulder arthroscopy w/ open RTC repair, subacrominal decompression and distal clavicle exision 07/30/2018. PMHx includes L RTC repair, B TKR, PVD, CHF, MI, CVA (R side weakness), anxiety, depression, DM2, CKD, glaucoma, HTN, HLD, and TIAs.   Clinical Impression  Pt is a pleasant 62 year old female who was admitted for R RTC repair. Pt performs mobility with SPC, transfers with CGA, and amb with CGA, +2 for chair follow. Pt demonstrates deficits with RUE weakness and B LE weakness. Pt given brief education on HEP will review this afternoon. Would benefit from skilled PT to address above deficits and promote optimal return to PLOF. This entire session was guided, instructed, and directly supervised by Elizabeth Palau, DPT.       Follow Up Recommendations Home health PT;Supervision/Assistance - 24 hour    Equipment Recommendations  None recommended by PT    Recommendations for Other Services       Precautions / Restrictions Precautions Precautions: Fall;Shoulder Type of Shoulder Precautions: NO ROM of shoulder, sling/immobilizer on at all times except ADL and exercises, RUE NWBing, elbow/wrist/hand AROM okay Shoulder Interventions: Shoulder sling/immobilizer;Shoulder abduction pillow;At all times;Off for dressing/bathing/exercises Precaution Booklet Issued: Yes (comment) Precaution Comments: Exercises reviewed, but will review again this afternoon Restrictions Weight Bearing Restrictions: Yes RUE Weight Bearing: Non weight bearing      Mobility  Bed Mobility Overal bed mobility: Needs Assistance Bed Mobility: Supine to Sit     Supine to sit: HOB elevated;Min guard     General bed mobility comments: Pt recieved sitting in chair, did not assess bed mobility  Transfers Overall transfer level: Needs  assistance Equipment used: Straight cane Transfers: Sit to/from Stand Sit to Stand: Min guard         General transfer comment: Performed with SPC and CGA, increased effort/time needed.  Ambulation/Gait Ambulation/Gait assistance: Min guard;+2 safety/equipment(Requires chair follow due to patients quick fatigue.) Gait Distance (Feet): 30 Feet Assistive device: Straight cane       General Gait Details: Pt has B dec step length, foot clearance, and very slow gait. Pt fatigues quickly due to pain and generalized weakness of LE. Ambulation requires full effort. Needed seated rest break in chair after 15 feet.  Stairs            Wheelchair Mobility    Modified Rankin (Stroke Patients Only)       Balance Overall balance assessment: Needs assistance Sitting-balance support: No upper extremity supported;Feet supported Sitting balance-Leahy Scale: Fair     Standing balance support: Single extremity supported;During functional activity Standing balance-Leahy Scale: Fair                               Pertinent Vitals/Pain Pain Assessment: Faces  Faces Pain Scale: Hurts whole lot Pain Location: R shoulder Pain Descriptors / Indicators: Sharp;Aching;Grimacing;Guarding Pain Intervention(s): Limited activity within patient's tolerance;Monitored during session    Home Living Family/patient expects to be discharged to:: Private residence Living Arrangements: Alone Available Help at Discharge: Family;Personal care attendant Type of Home: Apartment Home Access: Level entry     Home Layout: One level Home Equipment: Walker - 4 wheels;Cane - single point;Tub bench;Toilet riser;Grab bars - toilet;Grab bars - tub/shower;Adaptive equipment;Bedside commode      Prior Function Level of Independence: Needs assistance   Gait /  Transfers Assistance Needed: pt ambulates primarily with SPC, sometimes rollator, denies falls in past 12 months  ADL's / Homemaking  Assistance Needed: Pt has PCA who assists daily ~8am-5pm with ADL, meals, housekeeping; indep with med mgt; family assists with transportation (pt doesn't have a car, but can drive)        Hand Dominance   Dominant Hand: Right    Extremity/Trunk Assessment   Upper Extremity Assessment Upper Extremity Assessment: RUE deficits/detail(LUE grossly WFL) RUE Deficits / Details: Generalized weakness in RUE(3+/5) RUE: Unable to fully assess due to immobilization;Unable to fully assess due to pain RUE Sensation: WNL RUE Coordination: decreased gross motor    Lower Extremity Assessment Lower Extremity Assessment: Generalized weakness    Cervical / Trunk Assessment Cervical / Trunk Assessment: Normal  Communication   Communication: No difficulties  Cognition Arousal/Alertness: Awake/alert Behavior During Therapy: WFL for tasks assessed/performed Overall Cognitive Status: Within Functional Limits for tasks assessed                                        General Comments General comments (skin integrity, edema, etc.): Polar care and sling in place at start and end of session    Exercises Other Exercises: Seated SLRs, hip ABD/ABD, and ankle pumps. All ther-ex performed 10 x reps with supervision and VC for technique.   Assessment/Plan    PT Assessment Patient needs continued PT services  PT Problem List Decreased strength;Decreased activity tolerance;Decreased balance;Decreased mobility;Obesity;Pain       PT Treatment Interventions Gait training;Functional mobility training;Therapeutic activities;Therapeutic exercise;Balance training;Neuromuscular re-education;Patient/family education    PT Goals (Current goals can be found in the Care Plan section)  Acute Rehab PT Goals Patient Stated Goal: to go home and recover PT Goal Formulation: With patient Time For Goal Achievement: 08/14/18 Potential to Achieve Goals: Fair Additional Goals Additional Goal #1: Pt will  be able to perform bed mobility and transfers with supervision to improve ability to mobilize in the home.    Frequency BID   Barriers to discharge        Co-evaluation               AM-PAC PT "6 Clicks" Daily Activity  Outcome Measure Difficulty turning over in bed (including adjusting bedclothes, sheets and blankets)?: Unable Difficulty moving from lying on back to sitting on the side of the bed? : Unable Difficulty sitting down on and standing up from a chair with arms (e.g., wheelchair, bedside commode, etc,.)?: Unable Help needed moving to and from a bed to chair (including a wheelchair)?: A Little Help needed walking in hospital room?: A Little Help needed climbing 3-5 steps with a railing? : Total 6 Click Score: 10    End of Session Equipment Utilized During Treatment: Gait belt Activity Tolerance: Patient limited by fatigue;Patient limited by pain Patient left: in chair;with call bell/phone within reach;with chair alarm set;with nursing/sitter in room Nurse Communication: Mobility status PT Visit Diagnosis: Unsteadiness on feet (R26.81);Other abnormalities of gait and mobility (R26.89);Muscle weakness (generalized) (M62.81)    Time: 1030-1101 PT Time Calculation (min) (ACUTE ONLY): 31 min   Charges:   PT Evaluation $PT Eval Moderate Complexity: 1 Mod PT Treatments $Therapeutic Exercise: 8-22 mins        Arvilla MeresSarah Adewale Pucillo, SPT   Arvilla MeresSarah Joesphine Schemm 07/31/2018, 12:32 PM

## 2018-07-31 NOTE — Discharge Summary (Signed)
Physician Discharge Summary  Patient ID: Kristine DrossCathy A Hass MRN: [id number] DOB/AGE: 64-Aug-1957 64 y.o.  Admit date: 07/30/2018 Discharge date: 07/31/2018  Admission Diagnoses:  FULL THICKNESS ROTATOR CUFF <principal problem not specified>  Discharge Diagnoses:  FULL THICKNESS ROTATOR CUFF Active Problems:   S/P right rotator cuff repair   Past Medical History:  Diagnosis Date  . Anxiety   . Arthritis   . Asthma   . Bladder leak   . Cerebral aneurysm    history-has coils  . CHF (congestive heart failure) (HCC)   . Chronic kidney disease   . Depression   . Diabetes mellitus without complication (HCC)   . Dyspnea   . GERD (gastroesophageal reflux disease)   . Glaucoma   . Headache   . History of kidney stones   . Hyperlipidemia   . Hypertension   . MI (myocardial infarction) (HCC)    unsure when  . Neuropathy   . Rotator cuff injury   . Seasonal allergies   . Stroke [facility](HCC)    TIA's    Surgeries: Procedure(s): SHOULDER ARTHROSCOPY WITH OPEN ROTATOR CUFF REPAIR,SUBACROMINAL DECOMPRESSION AND DISTAL CLAVICLE EXCISION on 07/30/2018   Consultants (if any):   Discharged Condition: Improved  Hospital Course: Kristine DrossCathy A Medinger is an 62 y.o. female who was admitted 07/30/2018 with a diagnosis of  FULL THICKNESS ROTATOR CUFF, RIGHT SHOULDER and went to the operating room on 07/30/2018 and underwent an uncomplicated rotator cuff repair.    She was given perioperative antibiotics:  Anti-infectives (From admission, onward)   Start     Dose/Rate Route Frequency Ordered Stop   07/30/18 1900  clindamycin (CLEOCIN) IVPB 600 mg     600 mg 100 mL/hr over 30 Minutes Intravenous Every 6 hours 07/30/18 1642 07/31/18 0710   07/30/18 1016  clindamycin (CLEOCIN) 900 MG/50ML IVPB    Note to Pharmacy:  Mike CrazeHolmes, Stephen   : cabinet override      07/30/18 1016 07/30/18 1316   07/30/18 0600  clindamycin (CLEOCIN) IVPB 900 mg     900 mg 100 mL/hr over 30 Minutes Intravenous On call to O.R. 07/29/18  2312 07/30/18 1331    .  She was given sequential compression devices, early ambulation, and aspirin for DVT prophylaxis.  She benefited maximally from the hospital stay and there were no complications.    Recent vital signs:  Vitals:   07/31/18 0458 07/31/18 0830  BP: 96/84 (!) 109/54  Pulse: (!) 52 (!) 57  Resp: 17 17  Temp: (!) 97.5 F (36.4 C) 98.2 F (36.8 C)  SpO2: 100% 100%    Recent laboratory studies:  Lab Results  Component Value Date   HGB 11.8 (L) 07/31/2018   HGB 12.1 07/24/2018   HGB 13.6 09/09/2017   Lab Results  Component Value Date   WBC 8.4 07/31/2018   PLT 262 07/31/2018   Lab Results  Component Value Date   INR 0.98 07/24/2018   Lab Results  Component Value Date   NA 139 07/31/2018   K 3.9 07/31/2018   CL 112 (H) 07/31/2018   CO2 21 (L) 07/31/2018   BUN 15 07/31/2018   CREATININE 1.05 (H) 07/31/2018   GLUCOSE 155 (H) 07/31/2018    Discharge Medications:   Allergies as of 07/31/2018      Reactions   Bee Venom Anaphylaxis   Penicillins Hives, Swelling, Other (See Comments)   Has patient had a PCN reaction causing immediate rash, facial/tongue/throat swelling, SOB or lightheadedness with hypotension: Yes Has patient  had a PCN reaction causing severe rash involving mucus membranes or skin necrosis: No Has patient had a PCN reaction that required hospitalization No Has patient had a PCN reaction occurring within the last 10 years: No If all of the above answers are "NO", then may proceed with Cephalosporin use.      Medication List    TAKE these medications   albuterol (2.5 MG/3ML) 0.083% nebulizer solution Commonly known as:  PROVENTIL Take 2.5 mg by nebulization every 6 (six) hours as needed for wheezing or shortness of breath.   aspirin EC 81 MG tablet Take 81 mg by mouth daily.   atorvastatin 40 MG tablet Commonly known as:  LIPITOR Take 40 mg by mouth at bedtime.   cetirizine 10 MG tablet Commonly known as:  ZYRTEC Take 10  mg by mouth daily.   COMBIGAN 0.2-0.5 % ophthalmic solution Generic drug:  brimonidine-timolol Place 1 drop into both eyes 2 (two) times daily.   docusate sodium 100 MG capsule Commonly known as:  COLACE Take 1 capsule (100 mg total) by mouth 2 (two) times daily.   DULoxetine 60 MG capsule Commonly known as:  CYMBALTA Take 60 mg by mouth at bedtime.   EPINEPHrine 0.3 mg/0.3 mL Soaj injection Commonly known as:  EPI-PEN Inject 0.3 mg into the skin once as needed (allergic reactions).   esomeprazole 40 MG capsule Commonly known as:  NEXIUM Take 40 mg by mouth daily.   furosemide 40 MG tablet Commonly known as:  LASIX Take 40 mg by mouth 2 (two) times daily.   gabapentin 300 MG capsule Commonly known as:  NEURONTIN Take 300 mg by mouth 3 (three) times daily as needed (pain).   latanoprost 0.005 % ophthalmic solution Commonly known as:  XALATAN Place 1 drop into both eyes at bedtime.   meloxicam 15 MG tablet Commonly known as:  MOBIC Take 1 tablet (15 mg total) by mouth daily.   oxyCODONE 5 MG immediate release tablet Commonly known as:  Oxy IR/ROXICODONE Take 1 tablet (5 mg total) by mouth every 4 (four) hours as needed for moderate pain (pain score 4-6).   potassium chloride 10 MEQ tablet Commonly known as:  K-DUR Take 10 mEq by mouth daily.   solifenacin 10 MG tablet Commonly known as:  VESICARE Take 10 mg by mouth daily.   SYMBICORT 160-4.5 MCG/ACT inhaler Generic drug:  budesonide-formoterol Inhale 2 puffs into the lungs 2 (two) times daily.   tiZANidine 4 MG tablet Commonly known as:  ZANAFLEX Take 2-4 mg by mouth every 6 (six) hours as needed for muscle spasms.   topiramate 100 MG tablet Commonly known as:  TOPAMAX Take 100 mg by mouth at bedtime.       Diagnostic Studies: No results found.  Disposition: Discharge disposition: 01-Home or Self Care       Discharge Instructions    Call MD / Call 911   Complete by:  As directed    If you  experience chest pain or shortness of breath, CALL 911 and be transported to the hospital emergency room.  If you develope a fever above 101 F, pus (white drainage) or increased drainage or redness at the wound, or calf pain, call your surgeon's office.   Constipation Prevention   Complete by:  As directed    Drink plenty of fluids.  Prune juice may be helpful.  You may use a stool softener, such as Colace (over the counter) 100 mg twice a day.  Use MiraLax (over  the counter) for constipation as needed.   Diet general   Complete by:  As directed    Discharge instructions   Complete by:  As directed    Wear sling at all times, including sleep.  You will need to use the sling for a total of 4 weeks following surgery.  Do not try and lift your arm up or away from your body for any reason.   Keep the dressing dry.  You may remove bandage in 3 days.  Leave the Steri-Strips (white medical tape) in place.  You may place additional Band-Aids over top of the Steri-Strips if you wish.  May shower once dressing is removed in 3 days.  Remove sling carefully only for showers, leaving arm down by your side while in the shower.  If the the pain medication causes itching, or is too strong, try taking a single tablet at a time, or combining with Benadryl.  You may be most comfortable sleeping in a recliner.  If you do sleep in near bed, placed pillows behind the shoulder that have the operation to support it.   Driving restrictions   Complete by:  As directed    No driving for 4 weeks   Increase activity slowly as tolerated   Complete by:  As directed    Lifting restrictions   Complete by:  As directed    No lifting for 12-16 weeks         Signed: Juanell Fairly ,MD 07/31/2018, 1:26 PM

## 2018-07-31 NOTE — Progress Notes (Signed)
  Subjective:  POD #1 s/p right shoulder rotator cuff repair.  Patient reports right shoulder pain as mild to moderate.    Objective:   VITALS:   Vitals:   07/30/18 2145 07/31/18 0036 07/31/18 0458 07/31/18 0830  BP: 114/61 (!) 118/52 96/84 (!) 109/54  Pulse: (!) 51 61 (!) 52 (!) 57  Resp: 17 19 17 17   Temp: 97.6 F (36.4 C) 98.6 F (37 C) (!) 97.5 F (36.4 C) 98.2 F (36.8 C)  TempSrc: Oral  Oral Oral  SpO2: 100% 100% 100% 100%  Weight:      Height:        PHYSICAL EXAM: Right upper extremity: Neurovascular intact Sensation intact distally Intact pulses distally No cellulitis present Compartment soft  LABS  Results for orders placed or performed during the hospital encounter of 07/30/18 (from the past 24 hour(s))  Glucose, capillary     Status: None   Collection Time: 07/30/18  3:49 PM  Result Value Ref Range   Glucose-Capillary 92 70 - 99 mg/dL  CBC     Status: Abnormal   Collection Time: 07/31/18  6:04 AM  Result Value Ref Range   WBC 8.4 3.6 - 11.0 K/uL   RBC 4.22 3.80 - 5.20 MIL/uL   Hemoglobin 11.8 (L) 12.0 - 16.0 g/dL   HCT 40.936.2 81.135.0 - 91.447.0 %   MCV 85.8 80.0 - 100.0 fL   MCH 28.1 26.0 - 34.0 pg   MCHC 32.7 32.0 - 36.0 g/dL   RDW 78.215.7 (H) 95.611.5 - 21.314.5 %   Platelets 262 150 - 440 K/uL  Basic metabolic panel     Status: Abnormal   Collection Time: 07/31/18  6:04 AM  Result Value Ref Range   Sodium 139 135 - 145 mmol/L   Potassium 3.9 3.5 - 5.1 mmol/L   Chloride 112 (H) 98 - 111 mmol/L   CO2 21 (L) 22 - 32 mmol/L   Glucose, Bld 155 (H) 70 - 99 mg/dL   BUN 15 8 - 23 mg/dL   Creatinine, Ser 0.861.05 (H) 0.44 - 1.00 mg/dL   Calcium 9.0 8.9 - 57.810.3 mg/dL   GFR calc non Af Amer 55 (L) >60 mL/min   GFR calc Af Amer >60 >60 mL/min   Anion gap 6 5 - 15    No results found.  Assessment/Plan: 1 Day Post-Op   Active Problems:   S/P right rotator cuff repair  Patient will be discharged home today.  She will work with physical therapy again this afternoon  before she leaves.  Patient is being recommended for home health PT.  I am going to place the order for this home service.  Patient will wear her abduction sling at all times at home.  Her dressing may be removed on postop day #3.  She will avoid any active forward elevation or abduction.  Patient will follow-up with me in 7 to 10 days.   Juanell FairlyKRASINSKI, Lynnex Fulp , MD 07/31/2018, 1:20 PM

## 2018-07-31 NOTE — Progress Notes (Signed)
Patient is being discharged home with friend. IV removed by Cortney, NT.  Polar care reviewed and sent with patient. Reviewed dress care, activity, and medications. Script sent with patient.

## 2018-12-04 ENCOUNTER — Emergency Department: Payer: Medicare Other

## 2018-12-04 ENCOUNTER — Emergency Department
Admission: EM | Admit: 2018-12-04 | Discharge: 2018-12-04 | Disposition: A | Discharge status: Home / Self Care | Payer: Medicare Other | Attending: Emergency Medicine | Admitting: Emergency Medicine

## 2018-12-04 ENCOUNTER — Encounter: Payer: Self-pay | Admitting: Emergency Medicine

## 2018-12-04 DIAGNOSIS — Z79899 Other long term (current) drug therapy: Secondary | ICD-10-CM | POA: Diagnosis not present

## 2018-12-04 DIAGNOSIS — J45909 Unspecified asthma, uncomplicated: Secondary | ICD-10-CM | POA: Insufficient documentation

## 2018-12-04 DIAGNOSIS — M546 Pain in thoracic spine: Secondary | ICD-10-CM

## 2018-12-04 DIAGNOSIS — Z87891 Personal history of nicotine dependence: Secondary | ICD-10-CM | POA: Insufficient documentation

## 2018-12-04 DIAGNOSIS — M542 Cervicalgia: Secondary | ICD-10-CM

## 2018-12-04 DIAGNOSIS — I130 Hypertensive heart and chronic kidney disease with heart failure and stage 1 through stage 4 chronic kidney disease, or unspecified chronic kidney disease: Secondary | ICD-10-CM | POA: Insufficient documentation

## 2018-12-04 DIAGNOSIS — E119 Type 2 diabetes mellitus without complications: Secondary | ICD-10-CM | POA: Insufficient documentation

## 2018-12-04 DIAGNOSIS — Z7982 Long term (current) use of aspirin: Secondary | ICD-10-CM | POA: Insufficient documentation

## 2018-12-04 DIAGNOSIS — I509 Heart failure, unspecified: Secondary | ICD-10-CM | POA: Diagnosis not present

## 2018-12-04 DIAGNOSIS — N189 Chronic kidney disease, unspecified: Secondary | ICD-10-CM | POA: Insufficient documentation

## 2018-12-04 LAB — BASIC METABOLIC PANEL
Anion gap: 6 (ref 5–15)
BUN: 11 mg/dL (ref 8–23)
CHLORIDE: 108 mmol/L (ref 98–111)
CO2: 23 mmol/L (ref 22–32)
CREATININE: 0.83 mg/dL (ref 0.44–1.00)
Calcium: 10.1 mg/dL (ref 8.9–10.3)
Glucose, Bld: 96 mg/dL (ref 70–99)
Potassium: 3.8 mmol/L (ref 3.5–5.1)
SODIUM: 137 mmol/L (ref 135–145)

## 2018-12-04 LAB — CBC WITH DIFFERENTIAL/PLATELET
Abs Immature Granulocytes: 0.02 10*3/uL (ref 0.00–0.07)
BASOS PCT: 1 %
Basophils Absolute: 0.1 10*3/uL (ref 0.0–0.1)
EOS ABS: 0.1 10*3/uL (ref 0.0–0.5)
EOS PCT: 2 %
HEMATOCRIT: 40.9 % (ref 36.0–46.0)
Hemoglobin: 12.8 g/dL (ref 12.0–15.0)
IMMATURE GRANULOCYTES: 0 %
LYMPHS ABS: 1.2 10*3/uL (ref 0.7–4.0)
Lymphocytes Relative: 17 %
MCH: 26.7 pg (ref 26.0–34.0)
MCHC: 31.3 g/dL (ref 30.0–36.0)
MCV: 85.2 fL (ref 80.0–100.0)
Monocytes Absolute: 0.9 10*3/uL (ref 0.1–1.0)
Monocytes Relative: 14 %
NEUTROS PCT: 66 %
NRBC: 0 % (ref 0.0–0.2)
Neutro Abs: 4.6 10*3/uL (ref 1.7–7.7)
PLATELETS: 381 10*3/uL (ref 150–400)
RBC: 4.8 MIL/uL (ref 3.87–5.11)
RDW: 14.6 % (ref 11.5–15.5)
WBC: 6.8 10*3/uL (ref 4.0–10.5)

## 2018-12-04 LAB — TROPONIN I

## 2018-12-04 MED ORDER — LIDOCAINE 5 % EX PTCH
1.0000 | MEDICATED_PATCH | CUTANEOUS | Status: DC
  Administered 2018-12-04: 1 via TRANSDERMAL
  Filled 2018-12-04: qty 1

## 2018-12-04 MED ORDER — LIDOCAINE 5 % EX PTCH: 1.0000 | MEDICATED_PATCH | Freq: Two times a day (BID) | CUTANEOUS | 0 refills | Status: DC

## 2018-12-04 NOTE — ED Triage Notes (Signed)
Patient presents to the ED with complaint of neck pain since Saturday.  Patient states she has had similar pain in the past.  Patient reports seeing her doctor on Friday and was prescribed muscle relaxers for back pain.  Patient states muscle relaxers and tramadol are not improving her neck pain.

## 2018-12-04 NOTE — ED Notes (Signed)
Pt c/o thoracic back pain with radiation into neck - describes pain as sharp/shooting 8/10 - she reports she has had this same pain for some time but is having no relief from medication rx'd by PCP recently - pt reports that the reason she came today was an increase in weakness with general malaise

## 2018-12-04 NOTE — ED Notes (Signed)
Patient verbalized understanding of discharge instructions, no questions. Patient out of ED via wheelchair to wait in lobby for ride in no distress.

## 2018-12-04 NOTE — ED Notes (Signed)
Pt c/o of pain that started in her back that traveled up to her neck.

## 2018-12-04 NOTE — ED Notes (Addendum)
Attempted to start IV without success - Helmut Muster RN looked for access without success - IV team order for IV ultrasound

## 2018-12-04 NOTE — ED Provider Notes (Signed)
[facility] Emergency Department Provider Note ________________________________________   I have reviewed the triage vital signs and the nursing notes.   HISTORY  Chief Complaint Neck Pain   History limited by: Not Limited   HPI Kristine Rangel is a 63 y.o. female who presents to the emergency department today because of concerns for continued neck pain.  The patient states that she started having some pain 5 days ago in her mid back.  Since that time it is now come to her head.  She has been taking tramadol and muscle relaxants without great pain relief.  She saw Dr. for this who prescribed a muscle relaxers.  Patient does have a history of chronic neck pain but states that it feels similar however normally her pain does not last this long it typically comes and goes.  Patient is also complained of some generalized weakness.  She denies any fevers. Denies any falls or trauma to her back.    Per medical record review patient has a history of neck pain and cervical stenosis seen on MRI from 10 months ago  Past Medical History:  Diagnosis Date  . Anxiety   . Arthritis   . Asthma   . Bladder leak   . Cerebral aneurysm    history-has coils  . CHF (congestive heart failure) (HCC)   . Chronic kidney disease   . Depression   . Diabetes mellitus without complication (HCC)   . Dyspnea   . GERD (gastroesophageal reflux disease)   . Glaucoma   . Headache   . History of kidney stones   . Hyperlipidemia   . Hypertension   . MI (myocardial infarction) (HCC)    unsure when  . Neuropathy   . Rotator cuff injury   . Seasonal allergies   . Stroke [facility](HCC)    TIA's    Patient Active Problem List   Diagnosis Date Noted  . S/P right rotator cuff repair 07/30/2018  . S/P left rotator cuff repair 11/23/2016    Past Surgical History:  Procedure Laterality Date  . BRAIN SURGERY Right 2010   anuerysm repair  . CARPAL TUNNEL RELEASE Right 06/12/2017   Procedure:  CARPAL TUNNEL RELEASE;  Surgeon: Juanell FairlyKrasinski, Kevin, MD;  Location: [facility] ORS;  Service: Orthopedics;  Laterality: Right;  . CHOLECYSTECTOMY    . JOINT REPLACEMENT Bilateral    TOTAL KNEE REPLACEMENT  . SHOULDER ARTHROSCOPY WITH OPEN ROTATOR CUFF REPAIR Left 11/23/2016   Procedure: SHOULDER ARTHROSCOPY WITH OPEN ROTATOR CUFF REPAIR, ARTHROSCOPIC SUBACROMIAL DECOMPRESSION, DISTAL CLAVICLE EXCISION;  Surgeon: Juanell FairlyKevin Krasinski, MD;  Location: [facility] ORS;  Service: Orthopedics;  Laterality: Left;  . SHOULDER ARTHROSCOPY WITH OPEN ROTATOR CUFF REPAIR Right 07/30/2018   Procedure: SHOULDER ARTHROSCOPY WITH OPEN ROTATOR CUFF REPAIR,SUBACROMINAL DECOMPRESSION AND DISTAL CLAVICLE EXCISION;  Surgeon: Juanell FairlyKrasinski, Kevin, MD;  Location: [facility] ORS;  Service: Orthopedics;  Laterality: Right;    Prior to Admission medications   Medication Sig Start Date End Date Taking? Authorizing Provider  albuterol (PROVENTIL) (2.5 MG/3ML) 0.083% nebulizer solution Take 2.5 mg by nebulization every 6 (six) hours as needed for wheezing or shortness of breath.  03/16/16   [provider]  aspirin EC 81 MG tablet Take 81 mg by mouth daily.    [provider]  atorvastatin (LIPITOR) 40 MG tablet Take 40 mg by mouth at bedtime. 03/13/16   [provider]  brimonidine-timolol (COMBIGAN) 0.2-0.5 % ophthalmic solution Place 1 drop into both eyes 2 (two) times daily.  [provider]  cetirizine (ZYRTEC) 10 MG tablet Take 10 mg by mouth daily.    [provider]  docusate sodium (COLACE) 100 MG capsule Take 1 capsule (100 mg total) by mouth 2 (two) times daily. 07/31/18   Juanell FairlyKrasinski, Kevin, MD  DULoxetine (CYMBALTA) 60 MG capsule Take 60 mg by mouth at bedtime.  03/13/16   [provider]  EPINEPHrine 0.3 mg/0.3 mL IJ SOAJ injection Inject 0.3 mg into the skin once as needed (allergic reactions).     [provider]  esomeprazole (NEXIUM) 40 MG capsule Take 40 mg by mouth daily. 03/17/16    [provider]  furosemide (LASIX) 40 MG tablet Take 40 mg by mouth 2 (two) times daily.     [provider]  gabapentin (NEURONTIN) 300 MG capsule Take 300 mg by mouth 3 (three) times daily as needed (pain).    [provider]  latanoprost (XALATAN) 0.005 % ophthalmic solution Place 1 drop into both eyes at bedtime.    [provider]  meloxicam (MOBIC) 15 MG tablet Take 1 tablet (15 mg total) by mouth daily. 10/12/15   Beers, Charmayne Sheerharles M, PA-C  oxyCODONE (OXY IR/ROXICODONE) 5 MG immediate release tablet Take 1 tablet (5 mg total) by mouth every 4 (four) hours as needed for moderate pain (pain score 4-6). 07/31/18   Juanell FairlyKrasinski, Kevin, MD  potassium chloride (K-DUR) 10 MEQ tablet Take 10 mEq by mouth daily. 03/13/16   [provider]  solifenacin (VESICARE) 10 MG tablet Take 10 mg by mouth daily.    [provider]  SYMBICORT 160-4.5 MCG/ACT inhaler Inhale 2 puffs into the lungs 2 (two) times daily. 03/13/16   [provider]  tiZANidine (ZANAFLEX) 4 MG tablet Take 2-4 mg by mouth every 6 (six) hours as needed for muscle spasms.  09/26/16   [provider]  topiramate (TOPAMAX) 100 MG tablet Take 100 mg by mouth at bedtime.  03/13/16   [provider]    Allergies Bee venom and Penicillins  Family History  Problem Relation Age of Onset  . Breast cancer Maternal Aunt     Social History Social History   Tobacco Use  . Smoking status: Former Smoker    Last attempt to quit: 11/17/1987    Years since quitting: 31.0  . Smokeless tobacco: Current User    Types: Snuff  Substance Use Topics  . Alcohol use: No  . Drug use: No    Review of Systems Constitutional: No fever/chills. Positive for generalized weakness.  Eyes: No visual changes. ENT: No sore throat. Cardiovascular: Denies chest pain. Respiratory: Denies shortness of breath. Gastrointestinal: No abdominal pain.  No nausea, no vomiting.  No diarrhea.    Genitourinary: Negative for dysuria. Musculoskeletal: Positive for back and neck pain. Skin: Negative for rash. Neurological: Negative for headaches, focal weakness or numbness.  ____________________________________________   PHYSICAL EXAM:  VITAL SIGNS: ED Triage Vitals  Enc Vitals Group     BP 12/04/18 0812 133/67     Pulse Rate 12/04/18 0812 (!) 106     Resp 12/04/18 0812 18     Temp 12/04/18 0812 98.5 F (36.9 C)     Temp Source 12/04/18 0812 Oral     SpO2 12/04/18 0812 95 %     Weight 12/04/18 0813 246 lb (111.6 kg)     Height 12/04/18 0813 5\' 5"  (1.651 m)     Head Circumference --      Peak Flow --  Pain Score 12/04/18 0813 10   Constitutional: Alert and oriented.  Eyes: Conjunctivae are normal.  ENT      Head: Normocephalic and atraumatic.      Nose: No congestion/rhinnorhea.      Mouth/Throat: Mucous membranes are moist.      Neck: No stridor. No cervical spine tenderness.  Hematological/Lymphatic/Immunilogical: No cervical lymphadenopathy. Cardiovascular: Normal rate, regular rhythm.  No murmurs, rubs, or gallops.  Respiratory: Normal respiratory effort without tachypnea nor retractions. Breath sounds are clear and equal bilaterally. No wheezes/rales/rhonchi. Gastrointestinal: Soft and non tender. No rebound. No guarding.  Genitourinary: Deferred Musculoskeletal: Positive for tenderness to the thoracic spine Neurologic:  Normal speech and language. No gross focal neurologic deficits are appreciated.  Skin:  Skin is warm, dry and intact. No rash noted. Psychiatric: Mood and affect are normal. Speech and behavior are normal. Patient exhibits appropriate insight and judgment.  ____________________________________________    LABS (pertinent positives/negatives)  Trop <0.03 CBC wbc 6.8, hgb 12.8, plt 381 BMP wnl  ____________________________________________   EKG  I, Phineas Semen, attending physician, personally viewed and interpreted this  EKG  EKG Time: 0838 Rate: 98 Rhythm: sinus rhythm Axis: normal Intervals: qtc 440 QRS: narrow ST changes: no st elevation Impression: normal ekg   ____________________________________________    RADIOLOGY  Thoracic spine Osteoarthritic change  ____________________________________________   PROCEDURES  Procedures  ____________________________________________   INITIAL IMPRESSION / ASSESSMENT AND PLAN / ED COURSE  Pertinent labs & imaging results that were available during my care of the patient were reviewed by me and considered in my medical decision making (see chart for details).   Patient presented to the emergency department today with complaints of back, neck pain as well as some generalized weakness.  Patient does have a history of some chronic neck issues.  Patient states the pain reminds her of the pain in the past although it is been more persistent.  On exam patient did have some tenderness over the thoracic spine although no tenderness over the cervical spine.  Patient thoracic x-ray without concerning findings.  Patient did get blood work checked given complaints of generalized weakness without any concerning findings.  Patient denying any focal weakness.  At this point I doubt significant central cord lesion given the lack of focal weakness.  Additionally doubt vertebral artery dissection given lack of focal weakness and chronic nature.  This point do feel is reasonable for patient to continue follow-up with outpatient providers.   ____________________________________________   FINAL CLINICAL IMPRESSION(S) / ED DIAGNOSES  Final diagnoses:  Neck pain  Thoracic back pain, unspecified back pain laterality, unspecified chronicity     Note: This dictation was prepared with Dragon dictation. Any transcriptional errors that result from this process are unintentional     Phineas Semen, MD 12/04/18 1150

## 2018-12-04 NOTE — Discharge Instructions (Signed)
Please seek medical attention for any high fevers, chest pain, shortness of breath, change in behavior, persistent vomiting, bloody stool or any other new or concerning symptoms.  

## 2018-12-04 NOTE — ED Triage Notes (Signed)
Pt in via EMS from home with c/o neck pain and back pain. Pt was seen at [facility] and given muscle relaxer which are not helping. VS 140/80, HR 90's, 99%RA

## 2018-12-04 NOTE — ED Notes (Signed)
IV team unable to access vein - Dr Lenard Lance at bedside attempt IV ultrasound Dr Derrill Kay aware of delay

## 2018-12-16 ENCOUNTER — Ambulatory Visit (INDEPENDENT_AMBULATORY_CARE_PROVIDER_SITE_OTHER): Payer: Commercial Managed Care - HMO | Admitting: Urology

## 2018-12-16 ENCOUNTER — Encounter: Payer: Self-pay | Admitting: Urology

## 2018-12-16 VITALS — BP 135/76 | HR 119 | Ht 65.0 in | Wt 291.6 lb

## 2018-12-16 DIAGNOSIS — N3281 Overactive bladder: Secondary | ICD-10-CM

## 2018-12-16 LAB — MICROSCOPIC EXAMINATION
RBC MICROSCOPIC, UA: NONE SEEN /HPF (ref 0–2)
WBC, UA: NONE SEEN /hpf (ref 0–5)

## 2018-12-16 LAB — URINALYSIS, COMPLETE
Bilirubin, UA: NEGATIVE
Glucose, UA: NEGATIVE
KETONES UA: NEGATIVE
Leukocytes, UA: NEGATIVE
NITRITE UA: NEGATIVE
Protein, UA: NEGATIVE
RBC, UA: NEGATIVE
Specific Gravity, UA: 1.02 (ref 1.005–1.030)
Urobilinogen, Ur: 1 mg/dL (ref 0.2–1.0)
pH, UA: 7 (ref 5.0–7.5)

## 2018-12-16 MED ORDER — TOLTERODINE TARTRATE ER 4 MG PO CP24: 4.0000 mg | ORAL_CAPSULE | Freq: Every day | ORAL | 11 refills | Status: DC

## 2018-12-16 NOTE — Progress Notes (Signed)
12/16/2018 9:20 AM   Kristine Rangel 1956/06/26 [id number]  Referring provider: Leanna Sato, MD [address] [place], [place] [address]  CC: Overactive bladder  HPI: I saw Kristine Rangel in urology clinic today in consultation for overactive bladder from Dr. Marvis Rangel.  She is a 63 year old morbidly obese and co-morbid female including depression/anxiety, diabetes, congestive heart failure on Lasix, and history of stroke.  Her primary complaint is urinary urgency and frequency, with urge incontinence.  She also has nocturia 2-3 times overnight.  She takes her second dose of Lasix around 2 PM to prevent worse nocturia.  She previously tried Myrbetriq 50 mg daily which did not improve her symptoms, as well as Vesicare and oxybutynin.  Vesicare was very effective, however not affordable and she was transitioned to oxybutynin which did not improve her symptoms.  She is referred for other options for overactive bladder.  She drinks 2-3 sodas per day.  She denies any dysuria, gross hematuria, or history of urinary tract infections.  She denies stress incontinence.  She ambulates with a walker.  There are no aggravating factors.  Severity is moderate.  PMH: Past Medical History:  Diagnosis Date  . Anxiety   . Arthritis   . Asthma   . Bladder leak   . Cerebral aneurysm    history-has coils  . CHF (congestive heart failure) (HCC)   . Chronic kidney disease   . Depression   . Diabetes mellitus without complication (HCC)   . Dyspnea   . GERD (gastroesophageal reflux disease)   . Glaucoma   . Headache   . History of kidney stones   . Hyperlipidemia   . Hypertension   . MI (myocardial infarction) (HCC)    unsure when  . Neuropathy   . Rotator cuff injury   . Seasonal allergies   . Stroke [facility])    TIA's    Surgical History: Past Surgical History:  Procedure Laterality Date  . BRAIN SURGERY Right 2010   anuerysm repair  . CARPAL TUNNEL RELEASE Right 06/12/2017   Procedure: CARPAL  TUNNEL RELEASE;  Surgeon: Kristine Fairly, MD;  Location: [facility] ORS;  Service: Orthopedics;  Laterality: Right;  . CHOLECYSTECTOMY    . JOINT REPLACEMENT Bilateral    TOTAL KNEE REPLACEMENT  . SHOULDER ARTHROSCOPY WITH OPEN ROTATOR CUFF REPAIR Left 11/23/2016   Procedure: SHOULDER ARTHROSCOPY WITH OPEN ROTATOR CUFF REPAIR, ARTHROSCOPIC SUBACROMIAL DECOMPRESSION, DISTAL CLAVICLE EXCISION;  Surgeon: Kristine Fairly, MD;  Location: [facility] ORS;  Service: Orthopedics;  Laterality: Left;  . SHOULDER ARTHROSCOPY WITH OPEN ROTATOR CUFF REPAIR Right 07/30/2018   Procedure: SHOULDER ARTHROSCOPY WITH OPEN ROTATOR CUFF REPAIR,SUBACROMINAL DECOMPRESSION AND DISTAL CLAVICLE EXCISION;  Surgeon: Kristine Fairly, MD;  Location: [facility] ORS;  Service: Orthopedics;  Laterality: Right;    Allergies:  Allergies  Allergen Reactions  . Bee Venom Anaphylaxis  . Penicillins Hives, Swelling and Other (See Comments)    Has patient had a PCN reaction causing immediate rash, facial/tongue/throat swelling, SOB or lightheadedness with hypotension: Yes Has patient had a PCN reaction causing severe rash involving mucus membranes or skin necrosis: No Has patient had a PCN reaction that required hospitalization No Has patient had a PCN reaction occurring within the last 10 years: No If all of the above answers are "NO", then may proceed with Cephalosporin use.    Family History: Family History  Problem Relation Age of Onset  . Breast cancer Maternal Aunt     Social History:  reports that she quit smoking  about 31 years ago. Her smokeless tobacco use includes snuff. She reports that she does not drink alcohol or use drugs.  ROS: Please see flowsheet from today's date for complete review of systems.  Physical Exam: BP 135/76 (BP Location: Left Arm, Patient Position: Sitting, Cuff Size: Large)   Pulse (!) 119   Ht 5\' 5"  (1.651 m)   Wt 291 lb 9.6 oz (132.3 kg)   BMI 48.52 kg/m    Constitutional:  Alert and oriented, No  acute distress.  Obese Cardiovascular: No clubbing, cyanosis, or edema. Respiratory: Normal respiratory effort, no increased work of breathing. GI: Abdomen is soft, nontender, nondistended, no abdominal masses GU: No CVA tenderness Lymph: No cervical or inguinal lymphadenopathy. Skin: No rashes, bruises or suspicious lesions. Neurologic: Grossly intact, no focal deficits, moving all 4 extremities. Psychiatric: Normal mood and affect.  Laboratory Data: Urinalysis today 0 WBCs, 0 RBCs, moderate bacteria, nitrite negative, yeast negative  Pertinent Imaging: None to review  Assessment & Plan:   In summary, the patient is a 63 year old very co-morbid female with moderate overactive bladder symptoms.  These were improved with an anticholinergic, however she felt the generic oxybutynin did not help as much as Vesicare, and she would like to explore other options.  We discussed that overactive bladder (OAB) is not a disease, but is a symptom complex that is generally not life-threatening.  Symptoms typically include urinary urgency, frequency, and urge incontinence.  There are numerous treatment options, however there are risks and benefits with both medical and surgical management.  First-line treatment is behavioral therapies including bladder training, pelvic floor muscle training, and fluid management.  Second line treatments include oral antimuscarinics(Ditropan er, Trospium) and beta-3 agonist (Mybetriq). There is typically a period of medication trial (4-8 weeks) to find the optimal therapy and dosing. If symptoms are bothersome despite the above management, third line options include intra-detrusor botox, peripheral tibial nerve stimulation (PTNS), and interstim (SNS). These are more invasive treatments with higher side effect profile, but may improve quality of life for patients with severe OAB symptoms.   -Minimize sodas -Trial of Detrol 4mg  daily, information on good [email or web address] provided -RTC 3  months for symptom check with Kristine Cowboy, PA, consider PTNS if refractory symptoms  Kristine Come, MD  [facility] [address], [address] [place], [place] [address] [phone]

## 2018-12-16 NOTE — Patient Instructions (Signed)

## 2018-12-23 ENCOUNTER — Telehealth: Payer: Self-pay

## 2018-12-23 NOTE — Telephone Encounter (Signed)
Called pt's home number, female answers and then hangs phone up. Will call pt again.

## 2018-12-24 NOTE — Telephone Encounter (Signed)
Called pt informed her that medication was approved, pt states that she is aware and has picked medication up.

## 2019-03-18 ENCOUNTER — Telehealth (INDEPENDENT_AMBULATORY_CARE_PROVIDER_SITE_OTHER): Payer: Medicare Other | Admitting: Urology

## 2019-03-18 ENCOUNTER — Other Ambulatory Visit: Payer: Self-pay

## 2019-03-18 ENCOUNTER — Telehealth: Payer: Self-pay | Admitting: Urology

## 2019-03-18 DIAGNOSIS — N3281 Overactive bladder: Secondary | ICD-10-CM | POA: Diagnosis not present

## 2019-03-18 DIAGNOSIS — N3941 Urge incontinence: Secondary | ICD-10-CM

## 2019-03-18 DIAGNOSIS — R351 Nocturia: Secondary | ICD-10-CM

## 2019-03-18 NOTE — Progress Notes (Signed)
Virtual Visit via Telephone Note  I connected with [name] on 03/18/2019 at 0914 by telephone and verified that I am speaking with the correct person using two identifiers.  They are located at home.  I am located at my home.    This visit type was conducted due to national recommendations for restrictions regarding the COVID-19 Pandemic (e.g. social distancing).  This format is felt to be most appropriate for this patient at this time.  All issues noted in this document were discussed and addressed.  No physical exam was performed.   I discussed the limitations, risks, security and privacy concerns of performing an evaluation and management service by telephone and the availability of in person appointments. I also discussed with the patient that there may be a patient responsible charge related to this service. The patient expressed understanding and agreed to proceed.   History of Present Illness: Mrs. Enciso is a 63 year old African American female with OAB that has undergone a three months trial of Detrol LA 4 mg daily for her symptoms of urgency, urge incontinence, frequency and nocturia x 2-3.  She has failed Myrbetriq and oxybutynin previously.    Vesicare was successful at reaching her goals, but it was cost prohibitive.   She states she has been taking the Detrol LA 4 mg daily as prescribed.  She states that it has improved her urinary symptoms.  She states that she has noted the urinary frequency has decreased.  She is still having some issues with urge incontinence, but she has gone from wearing 4-5 pads a day to 3-4 pads daily.  She is still having nocturia x2 3.  She denies any dry mouth or constipation or cognitive change while on the Detrol.  She would like to continue the Detrol LA 4 mg daily.  Patient does have a prescription written in February 2020 for a 30-day supply with 11 refills.  Patient denies any gross hematuria, dysuria or suprapubic/flank pain.  Patient denies  any fevers, chills, nausea or vomiting.    Observations/Objective: Mrs. Troyan did not sound distressed on the phone.  She engaged in appropriate conversation.    Assessment and Plan:  1. OAB -Continue Detrol LA 4 mg daily -Return in 6 months for OAB questionnaire and PVR  2. Urge incontinence -Managing with incontinence pads at this time -Will assess again when she returns in 6 months  3. Nocturia -Unchanged -May need discuss sleep study when she returns in 6 months  Follow Up Instructions:  Mrs. Forbes will follow-up in 6 months for an OAB questionnaire and PVR.  She is instructed to contact the office if she should develop symptoms of a UTI or her incontinence symptoms worsen.     I discussed the assessment and treatment plan with the patient. The patient was provided an opportunity to ask questions and all were answered. The patient agreed with the plan and demonstrated an understanding of the instructions.   The patient was advised to call back or seek an in-person evaluation if the symptoms worsen or if the condition fails to improve as anticipated.  I provided 8 minutes of non-face-to-face time during this encounter.   Berna Gitto, PA-C

## 2019-03-18 NOTE — Telephone Encounter (Signed)
Would you call Kristine Rangel and schedule her for a follow up appointment in 6 months with me for an OAB questionnaire and PVR?

## 2019-05-07 ENCOUNTER — Other Ambulatory Visit: Payer: Self-pay | Admitting: Family Medicine

## 2019-05-07 DIAGNOSIS — Z1231 Encounter for screening mammogram for malignant neoplasm of breast: Secondary | ICD-10-CM

## 2019-07-15 ENCOUNTER — Other Ambulatory Visit: Payer: Self-pay | Admitting: Family Medicine

## 2019-07-15 DIAGNOSIS — Z13820 Encounter for screening for osteoporosis: Secondary | ICD-10-CM

## 2019-07-30 ENCOUNTER — Ambulatory Visit
Admission: RE | Admit: 2019-07-30 | Discharge: 2019-07-30 | Disposition: A | Discharge status: Home / Self Care | Payer: Medicare HMO | Source: Ambulatory Visit | Attending: Family Medicine | Admitting: Family Medicine

## 2019-07-30 DIAGNOSIS — Z1231 Encounter for screening mammogram for malignant neoplasm of breast: Secondary | ICD-10-CM

## 2019-08-12 ENCOUNTER — Other Ambulatory Visit: Payer: Medicare Other

## 2019-09-26 ENCOUNTER — Ambulatory Visit: Payer: Medicare Other | Admitting: Urology

## 2019-10-20 NOTE — Progress Notes (Deleted)
10/21/2019 9:57 PM   Kristine Rangel December 08, 1955 [id number]  Referring provider: Leanna Sato, MD [address] [place],  [place] [address]  No chief complaint on file.   HPI: Kristine Rangel is a 63 year old female with OAB who presents today for a 6 months follow up.  The patient is  experiencing urgency x *** (***), frequency x *** (***), not/is restricting fluids to avoid visits to the restroom ***, not/is engaging in toilet mapping, incontinence x *** (***) and nocturia x *** (***).   Her BP is ***.   Her PVR is ***.    PMH: Past Medical History:  Diagnosis Date  . Anxiety   . Arthritis   . Asthma   . Bladder leak   . Cerebral aneurysm    history-has coils  . CHF (congestive heart failure) (HCC)   . Chronic kidney disease   . Depression   . Diabetes mellitus without complication (HCC)   . Dyspnea   . GERD (gastroesophageal reflux disease)   . Glaucoma   . Headache   . History of kidney stones   . Hyperlipidemia   . Hypertension   . MI (myocardial infarction) (HCC)    unsure when  . Neuropathy   . Rotator cuff injury   . Seasonal allergies   . Stroke [facility])    TIA's    Surgical History: Past Surgical History:  Procedure Laterality Date  . BRAIN SURGERY Right 2010   anuerysm repair  . CARPAL TUNNEL RELEASE Right 06/12/2017   Procedure: CARPAL TUNNEL RELEASE;  Surgeon: Juanell Fairly, MD;  Location: [facility] ORS;  Service: Orthopedics;  Laterality: Right;  . CHOLECYSTECTOMY    . JOINT REPLACEMENT Bilateral    TOTAL KNEE REPLACEMENT  . SHOULDER ARTHROSCOPY WITH OPEN ROTATOR CUFF REPAIR Left 11/23/2016   Procedure: SHOULDER ARTHROSCOPY WITH OPEN ROTATOR CUFF REPAIR, ARTHROSCOPIC SUBACROMIAL DECOMPRESSION, DISTAL CLAVICLE EXCISION;  Surgeon: Juanell Fairly, MD;  Location: [facility] ORS;  Service: Orthopedics;  Laterality: Left;  . SHOULDER ARTHROSCOPY WITH OPEN ROTATOR CUFF REPAIR Right 07/30/2018   Procedure: SHOULDER ARTHROSCOPY WITH OPEN ROTATOR CUFF  REPAIR,SUBACROMINAL DECOMPRESSION AND DISTAL CLAVICLE EXCISION;  Surgeon: Juanell Fairly, MD;  Location: [facility] ORS;  Service: Orthopedics;  Laterality: Right;    Home Medications:  Allergies as of 10/21/2019      Reactions   Bee Venom Anaphylaxis   Penicillins Hives, Swelling, Other (See Comments)   Has patient had a PCN reaction causing immediate rash, facial/tongue/throat swelling, SOB or lightheadedness with hypotension: Yes Has patient had a PCN reaction causing severe rash involving mucus membranes or skin necrosis: No Has patient had a PCN reaction that required hospitalization No Has patient had a PCN reaction occurring within the last 10 years: No If all of the above answers are "NO", then may proceed with Cephalosporin use.      Medication List       Accurate as of October 20, 2019  9:57 PM. If you have any questions, ask your nurse or doctor.        albuterol (2.5 MG/3ML) 0.083% nebulizer solution Commonly known as: PROVENTIL Take 2.5 mg by nebulization every 6 (six) hours as needed for wheezing or shortness of breath.   ALPRAZolam 0.5 MG tablet Commonly known as: XANAX alprazolam 0.5 mg tablet   aspirin EC 81 MG tablet Take 81 mg by mouth daily.   atorvastatin 40 MG tablet Commonly known as: LIPITOR Take 40 mg by mouth at bedtime.   Azopt 1 %  ophthalmic suspension Generic drug: brinzolamide Azopt 1 % eye drops,suspension   baclofen 10 MG tablet Commonly known as: LIORESAL baclofen 10 mg tablet   Boostrix 5-2.5-18.5 LF-MCG/0.5 injection Generic drug: Tdap Boostrix Tdap 2.5 Lf unit-8 mcg-5 Lf/0.5 mL intramuscular suspension   cetirizine 10 MG tablet Commonly known as: ZYRTEC Take 10 mg by mouth daily.   chlorhexidine 0.12 % solution Commonly known as: PERIDEX chlorhexidine gluconate 0.12 % mouthwash   Combigan 0.2-0.5 % ophthalmic solution Generic drug: brimonidine-timolol Place 1 drop into both eyes 2 (two) times daily.   cyclobenzaprine 10 MG  tablet Commonly known as: FLEXERIL cyclobenzaprine 10 mg tablet  Take 1 tablet(s) every 8 hours by oral route.   diazepam 2 MG tablet Commonly known as: VALIUM diazepam 2 mg tablet   diclofenac 75 MG EC tablet Commonly known as: VOLTAREN diclofenac sodium 75 mg tablet,delayed release   dicyclomine 10 MG capsule Commonly known as: BENTYL dicyclomine 10 mg capsule   docusate sodium 100 MG capsule Commonly known as: COLACE Take 1 capsule (100 mg total) by mouth 2 (two) times daily.   DULoxetine 60 MG capsule Commonly known as: CYMBALTA Take 60 mg by mouth at bedtime.   EPINEPHrine 0.3 mg/0.3 mL Soaj injection Commonly known as: EPI-PEN Inject 0.3 mg into the skin once as needed (allergic reactions).   esomeprazole 40 MG capsule Commonly known as: NEXIUM Take 40 mg by mouth daily.   Flucelvax Quadrivalent Susp injection Generic drug: Influenza Vac Subunit Quad Flucelvax Quad 2019-2020 60 mcg (15 mcg x 4)/0.5 mL intramuscular susp   fluticasone 50 MCG/ACT nasal spray Commonly known as: FLONASE fluticasone propionate 50 mcg/actuation nasal spray,suspension   furosemide 40 MG tablet Commonly known as: LASIX Take 40 mg by mouth 2 (two) times daily.   gabapentin 300 MG capsule Commonly known as: NEURONTIN Take 300 mg by mouth 3 (three) times daily as needed (pain).   HYDROcodone-acetaminophen 5-325 MG tablet Commonly known as: NORCO/VICODIN hydrocodone 5 mg-acetaminophen 325 mg tablet  take 1 tablet by mouth every 6 hours   ibuprofen 800 MG tablet Commonly known as: ADVIL ibuprofen 800 mg tablet   latanoprost 0.005 % ophthalmic solution Commonly known as: XALATAN Place 1 drop into both eyes at bedtime.   lidocaine 5 % Commonly known as: Lidoderm Place 1 patch onto the skin every 12 (twelve) hours. Remove & Discard patch within 12 hours or as directed by MD   meloxicam 7.5 MG tablet Commonly known as: MOBIC   oxybutynin 10 MG 24 hr tablet Commonly known as:  DITROPAN-XL oxybutynin chloride ER 10 mg tablet,extended release 24 hr   oxyCODONE 5 MG immediate release tablet Commonly known as: Oxy IR/ROXICODONE Take 1 tablet (5 mg total) by mouth every 4 (four) hours as needed for moderate pain (pain score 4-6).   potassium chloride 10 MEQ tablet Commonly known as: KLOR-CON Take 10 mEq by mouth daily.   solifenacin 10 MG tablet Commonly known as: VESICARE Take 10 mg by mouth daily.   Symbicort 160-4.5 MCG/ACT inhaler Generic drug: budesonide-formoterol Inhale 2 puffs into the lungs 2 (two) times daily.   tiZANidine 4 MG tablet Commonly known as: ZANAFLEX Take 2-4 mg by mouth every 6 (six) hours as needed for muscle spasms.   tolterodine 4 MG 24 hr capsule Commonly known as: Detrol LA Take 1 capsule (4 mg total) by mouth daily.   topiramate 100 MG tablet Commonly known as: TOPAMAX Take 100 mg by mouth at bedtime.   traMADol 50 MG tablet  Commonly known as: ULTRAM Take by mouth.       Allergies:  Allergies  Allergen Reactions  . Bee Venom Anaphylaxis  . Penicillins Hives, Swelling and Other (See Comments)    Has patient had a PCN reaction causing immediate rash, facial/tongue/throat swelling, SOB or lightheadedness with hypotension: Yes Has patient had a PCN reaction causing severe rash involving mucus membranes or skin necrosis: No Has patient had a PCN reaction that required hospitalization No Has patient had a PCN reaction occurring within the last 10 years: No If all of the above answers are "NO", then may proceed with Cephalosporin use.    Family History: Family History  Problem Relation Age of Onset  . Breast cancer Maternal Aunt     Social History:  reports that she quit smoking about 31 years ago. Her smokeless tobacco use includes snuff. She reports that she does not drink alcohol or use drugs.  ROS:                                        Physical Exam: There were no vitals taken for  this visit.  Constitutional:  Well nourished. Alert and oriented, No acute distress. HEENT: [place] AT, moist mucus membranes.  Trachea midline, no masses. Cardiovascular: No clubbing, cyanosis, or edema. Respiratory: Normal respiratory effort, no increased work of breathing. GI: Abdomen is soft, non tender, non distended, no abdominal masses. Liver and spleen not palpable.  No hernias appreciated.  Stool sample for occult testing is not indicated.   GU: No CVA tenderness.  No bladder fullness or masses.  *** external genitalia, *** pubic hair distribution, no lesions.  Normal urethral meatus, no lesions, no prolapse, no discharge.   No urethral masses, tenderness and/or tenderness. No bladder fullness, tenderness or masses. *** vagina mucosa, *** estrogen effect, no discharge, no lesions, *** pelvic support, *** cystocele and *** rectocele noted.  No cervical motion tenderness.  Uterus is freely mobile and non-fixed.  No adnexal/parametria masses or tenderness noted.  Anus and perineum are without rashes or lesions.   ***  Skin: No rashes, bruises or suspicious lesions. Lymph: No cervical or inguinal adenopathy. Neurologic: Grossly intact, no focal deficits, moving all 4 extremities. Psychiatric: Normal mood and affect.   Laboratory Data: Lab Results  Component Value Date   WBC 6.8 12/04/2018   HGB 12.8 12/04/2018   HCT 40.9 12/04/2018   MCV 85.2 12/04/2018   PLT 381 12/04/2018    Lab Results  Component Value Date   CREATININE 0.83 12/04/2018    No results found for: PSA  No results found for: TESTOSTERONE  Lab Results  Component Value Date   HGBA1C 6.1 (H) 07/24/2018    No results found for: TSH  No results found for: CHOL, HDL, CHOLHDL, VLDL, LDLCALC  Lab Results  Component Value Date   AST 15 08/19/2017   Lab Results  Component Value Date   ALT 10 (L) 08/19/2017   No components found for: ALKALINEPHOPHATASE No components found for: BILIRUBINTOTAL  No results found  for: ESTRADIOL  Urinalysis    Component Value Date/Time   COLORURINE YELLOW (A) 04/08/2016 1429   APPEARANCEUR Cloudy (A) 12/16/2018 0853   LABSPEC 1.019 04/08/2016 1429   LABSPEC 1.027 02/11/2013 0109   PHURINE 7.0 04/08/2016 1429   GLUCOSEU Negative 12/16/2018 0853   GLUCOSEU Negative 02/11/2013 0109   HGBUR NEGATIVE 04/08/2016 1429  BILIRUBINUR Negative 12/16/2018 0853   BILIRUBINUR Negative 02/11/2013 0109   KETONESUR NEGATIVE 04/08/2016 1429   PROTEINUR Negative 12/16/2018 0853   PROTEINUR NEGATIVE 04/08/2016 1429   NITRITE Negative 12/16/2018 0853   NITRITE NEGATIVE 04/08/2016 1429   LEUKOCYTESUR Negative 12/16/2018 0853   LEUKOCYTESUR Negative 02/11/2013 0109    I have reviewed the labs.   Pertinent Imaging: *** I have independently reviewed the films.    Assessment & Plan:  ***  Incontinence  - offered behavioral therapies  - bladder training  - bladder control strategies  - pelvic floor muscle training  - fluid management   - offered medical therapy with anticholinergic therapy or beta-3 adrenergic receptor agonist and the potential side effects of each therapy ***  - offered refer to gynecology for a pessary fitting ***  - offered an appointment with one of our surgeon for a possible pelvic sling procedure ***  - would like to try the beta-3 adrenergic receptor agonist (Myrbetriq).  Given Myrbetriq 25 mg samples, #28.  I have reviewed with the patient of the side effects of Myrbetriq, such as: elevation in BP, urinary retention and/or HA.  She will return in one month for PVR and symptom recheck.  ***  - would like to try anticholinergic therapy.  Given Vesicare /10mg , Toviaz /8mg  samples, # 28.   Advised of the side effects, such as: Dry eyes, dry mouth, constipation, mental confusion and/or urinary retention. ***  - RTC in 3 weeks for PVR and symptom recheck ***     No follow-ups on file.  These notes generated with voice recognition software. I  apologize for typographical errors.  Michiel Cowboy, PA-C  [facility] [address]  [address] [place], [place] [address] [phone]

## 2019-10-21 ENCOUNTER — Ambulatory Visit: Payer: Medicare HMO | Admitting: Urology

## 2019-11-10 NOTE — Progress Notes (Signed)
11/11/2019 11:32 AM   Kristine Rangel 02/05/1956 [id number]  Referring provider: Leanna SatoMiles, Linda M, MD [address] [place],  [place] [id number]  Chief Complaint  Patient presents with  . Over Active Bladder    HPI: Kristine Rangel is a 63 year old female with OAB who presents today for a 6 months follow up.  The patient is  experiencing urgency x 4-7, frequency x 4-7, not restricting fluids to avoid visits to the restroom, is engaging in toilet mapping, incontinence x 4-7 (unchanged) and nocturia x 4-7 (worsened).   Her BP is 142/89.   Her PVR is 86 mL.    She states the tolterodine 4 mg daily works in the morning, but it wears off by the end of the day.    Her blood sugars run 74 to 179 in the am and 117 to 120 at night.    Patient denies any gross hematuria, dysuria or suprapubic/flank pain.  Patient denies any fevers, chills, nausea or vomiting.   She is drinking two glasses a day of regular sodas.  She is drinking one cup of coffee in the morning.  She does not drink alcohol.    She is walking for exercise.    PMH: Past Medical History:  Diagnosis Date  . Anxiety   . Arthritis   . Asthma   . Bladder leak   . Cerebral aneurysm    history-has coils  . CHF (congestive heart failure) (HCC)   . Chronic kidney disease   . Depression   . Diabetes mellitus without complication (HCC)   . Dyspnea   . GERD (gastroesophageal reflux disease)   . Glaucoma   . Headache   . History of kidney stones   . Hyperlipidemia   . Hypertension   . MI (myocardial infarction) (HCC)    unsure when  . Neuropathy   . Rotator cuff injury   . Seasonal allergies   . Stroke [facility](HCC)    TIA's    Surgical History: Past Surgical History:  Procedure Laterality Date  . BRAIN SURGERY Right 2010   anuerysm repair  . CARPAL TUNNEL RELEASE Right 06/12/2017   Procedure: CARPAL TUNNEL RELEASE;  Surgeon: Juanell FairlyKrasinski, Kevin, MD;  Location: [facility] ORS;  Service: Orthopedics;  Laterality: Right;  .  CHOLECYSTECTOMY    . JOINT REPLACEMENT Bilateral    TOTAL KNEE REPLACEMENT  . SHOULDER ARTHROSCOPY WITH OPEN ROTATOR CUFF REPAIR Left 11/23/2016   Procedure: SHOULDER ARTHROSCOPY WITH OPEN ROTATOR CUFF REPAIR, ARTHROSCOPIC SUBACROMIAL DECOMPRESSION, DISTAL CLAVICLE EXCISION;  Surgeon: Juanell FairlyKevin Krasinski, MD;  Location: [facility] ORS;  Service: Orthopedics;  Laterality: Left;  . SHOULDER ARTHROSCOPY WITH OPEN ROTATOR CUFF REPAIR Right 07/30/2018   Procedure: SHOULDER ARTHROSCOPY WITH OPEN ROTATOR CUFF REPAIR,SUBACROMINAL DECOMPRESSION AND DISTAL CLAVICLE EXCISION;  Surgeon: Juanell FairlyKrasinski, Kevin, MD;  Location: [facility] ORS;  Service: Orthopedics;  Laterality: Right;    Home Medications:  Allergies as of 11/11/2019      Reactions   Bee Venom Anaphylaxis   Penicillins Hives, Swelling, Other (See Comments)   Has patient had a PCN reaction causing immediate rash, facial/tongue/throat swelling, SOB or lightheadedness with hypotension: Yes Has patient had a PCN reaction causing severe rash involving mucus membranes or skin necrosis: No Has patient had a PCN reaction that required hospitalization No Has patient had a PCN reaction occurring within the last 10 years: No If all of the above answers are "NO", then may proceed with Cephalosporin use.      Medication List  Accurate as of November 11, 2019 11:32 AM. If you have any questions, ask your nurse or doctor.        STOP taking these medications   baclofen 10 MG tablet Commonly known as: LIORESAL Stopped by: Michiel Cowboy, PA-C   Boostrix 5-2.5-18.5 LF-MCG/0.5 injection Generic drug: Tdap Stopped by: Michiel Cowboy, PA-C   diazepam 2 MG tablet Commonly known as: VALIUM Stopped by: Tivon Lemoine, PA-C   diclofenac 75 MG EC tablet Commonly known as: VOLTAREN Stopped by: Daneille Desilva, PA-C   dicyclomine 10 MG capsule Commonly known as: BENTYL Stopped by: Hughie Melroy, PA-C   docusate sodium 100 MG capsule Commonly known as:  COLACE Stopped by: Angel Weedon, PA-C   HYDROcodone-acetaminophen 5-325 MG tablet Commonly known as: NORCO/VICODIN Stopped by: Jordyne Poehlman, PA-C   ibuprofen 800 MG tablet Commonly known as: ADVIL Stopped by: Anjali Manzella, PA-C   lidocaine 5 % Commonly known as: Lidoderm Stopped by: Jahyra Sukup, PA-C   oxybutynin 10 MG 24 hr tablet Commonly known as: DITROPAN-XL Replaced by: oxybutynin 5 MG tablet Stopped by: Tremond Shimabukuro, PA-C   oxyCODONE 5 MG immediate release tablet Commonly known as: Oxy IR/ROXICODONE Stopped by: Lillie Portner, PA-C     TAKE these medications   albuterol (2.5 MG/3ML) 0.083% nebulizer solution Commonly known as: PROVENTIL Take 2.5 mg by nebulization every 6 (six) hours as needed for wheezing or shortness of breath.   ALPRAZolam 0.5 MG tablet Commonly known as: XANAX alprazolam 0.5 mg tablet   aspirin EC 81 MG tablet Take 81 mg by mouth daily.   atorvastatin 40 MG tablet Commonly known as: LIPITOR Take 40 mg by mouth at bedtime.   Azopt 1 % ophthalmic suspension Generic drug: brinzolamide Azopt 1 % eye drops,suspension   cetirizine 10 MG tablet Commonly known as: ZYRTEC Take 10 mg by mouth daily.   chlorhexidine 0.12 % solution Commonly known as: PERIDEX chlorhexidine gluconate 0.12 % mouthwash   Combigan 0.2-0.5 % ophthalmic solution Generic drug: brimonidine-timolol Place 1 drop into both eyes 2 (two) times daily.   cyclobenzaprine 10 MG tablet Commonly known as: FLEXERIL cyclobenzaprine 10 mg tablet  Take 1 tablet(s) every 8 hours by oral route.   DULoxetine 60 MG capsule Commonly known as: CYMBALTA Take 60 mg by mouth at bedtime.   EPINEPHrine 0.3 mg/0.3 mL Soaj injection Commonly known as: EPI-PEN Inject 0.3 mg into the skin once as needed (allergic reactions).   esomeprazole 40 MG capsule Commonly known as: NEXIUM Take 40 mg by mouth daily.   Flucelvax Quadrivalent Susp injection Generic drug:  Influenza Vac Subunit Quad Flucelvax Quad 2019-2020 60 mcg (15 mcg x 4)/0.5 mL intramuscular susp   fluticasone 50 MCG/ACT nasal spray Commonly known as: FLONASE fluticasone propionate 50 mcg/actuation nasal spray,suspension   furosemide 40 MG tablet Commonly known as: LASIX Take 40 mg by mouth 2 (two) times daily.   gabapentin 300 MG capsule Commonly known as: NEURONTIN Take 300 mg by mouth 3 (three) times daily as needed (pain).   latanoprost 0.005 % ophthalmic solution Commonly known as: XALATAN Place 1 drop into both eyes at bedtime.   meloxicam 7.5 MG tablet Commonly known as: MOBIC   oxybutynin 5 MG tablet Commonly known as: DITROPAN Take 1 tablet (5 mg total) by mouth 3 (three) times daily. Replaces: oxybutynin 10 MG 24 hr tablet Started by: Michiel Cowboy, PA-C   potassium chloride 10 MEQ tablet Commonly known as: KLOR-CON Take 10 mEq by mouth daily.   solifenacin 10 MG  tablet Commonly known as: VESICARE Take 10 mg by mouth daily.   Symbicort 160-4.5 MCG/ACT inhaler Generic drug: budesonide-formoterol Inhale 2 puffs into the lungs 2 (two) times daily.   tiZANidine 4 MG tablet Commonly known as: ZANAFLEX Take 2-4 mg by mouth every 6 (six) hours as needed for muscle spasms.   tolterodine 4 MG 24 hr capsule Commonly known as: Detrol LA Take 1 capsule (4 mg total) by mouth daily.   topiramate 100 MG tablet Commonly known as: TOPAMAX Take 100 mg by mouth at bedtime.   traMADol 50 MG tablet Commonly known as: ULTRAM Take by mouth.       Allergies:  Allergies  Allergen Reactions  . Bee Venom Anaphylaxis  . Penicillins Hives, Swelling and Other (See Comments)    Has patient had a PCN reaction causing immediate rash, facial/tongue/throat swelling, SOB or lightheadedness with hypotension: Yes Has patient had a PCN reaction causing severe rash involving mucus membranes or skin necrosis: No Has patient had a PCN reaction that required hospitalization  No Has patient had a PCN reaction occurring within the last 10 years: No If all of the above answers are "NO", then may proceed with Cephalosporin use.    Family History: Family History  Problem Relation Age of Onset  . Breast cancer Maternal Aunt     Social History:  reports that she quit smoking about 32 years ago. Her smokeless tobacco use includes snuff. She reports that she does not drink alcohol or use drugs.  ROS: UROLOGY Frequent Urination?: Yes Hard to postpone urination?: No Burning/pain with urination?: No Get up at night to urinate?: No Leakage of urine?: No Urine stream starts and stops?: No Trouble starting stream?: No Do you have to strain to urinate?: No Blood in urine?: No Urinary tract infection?: No Sexually transmitted disease?: No Injury to kidneys or bladder?: No Painful intercourse?: No Weak stream?: No Currently pregnant?: No Vaginal bleeding?: No Last menstrual period?: n  Gastrointestinal Nausea?: No Vomiting?: No Indigestion/heartburn?: No Diarrhea?: No Constipation?: No  Constitutional Fever: No Night sweats?: No Weight loss?: No Fatigue?: No  Skin Skin rash/lesions?: No Itching?: No  Eyes Blurred vision?: No Double vision?: No  Ears/Nose/Throat Sore throat?: No Sinus problems?: No  Hematologic/Lymphatic Swollen glands?: No Easy bruising?: No  Cardiovascular Leg swelling?: Yes Chest pain?: No  Respiratory Cough?: No Shortness of breath?: No  Endocrine Excessive thirst?: No  Musculoskeletal Back pain?: Yes Joint pain?: No  Neurological Headaches?: No Dizziness?: No  Psychologic Depression?: No Anxiety?: No  Physical Exam: BP (!) 142/89   Pulse 98   Ht 5\' 5"  (1.651 m)   BMI 48.52 kg/m   Constitutional:  Well nourished. Obese.  Alert and oriented, No acute distress. HEENT: [place] AT, mask in place.   Trachea midline, no masses. Cardiovascular: No clubbing, cyanosis, or edema. Respiratory: Normal  respiratory effort, no increased work of breathing. Neurologic: Grossly intact, no focal deficits, moving all 4 extremities. Psychiatric: Normal mood and affect.   Laboratory Data: Lab Results  Component Value Date   WBC 6.8 12/04/2018   HGB 12.8 12/04/2018   HCT 40.9 12/04/2018   MCV 85.2 12/04/2018   PLT 381 12/04/2018    Lab Results  Component Value Date   CREATININE 0.83 12/04/2018    No results found for: PSA  No results found for: TESTOSTERONE  Lab Results  Component Value Date   HGBA1C 6.1 (H) 07/24/2018    No results found for: TSH  No results found for:  CHOL, HDL, CHOLHDL, VLDL, LDLCALC  Lab Results  Component Value Date   AST 15 08/19/2017   Lab Results  Component Value Date   ALT 10 (L) 08/19/2017   No components found for: ALKALINEPHOPHATASE No components found for: BILIRUBINTOTAL  No results found for: ESTRADIOL  Urinalysis    Component Value Date/Time   COLORURINE YELLOW (A) 04/08/2016 1429   APPEARANCEUR Cloudy (A) 12/16/2018 0853   LABSPEC 1.019 04/08/2016 1429   LABSPEC 1.027 02/11/2013 0109   PHURINE 7.0 04/08/2016 1429   GLUCOSEU Negative 12/16/2018 0853   GLUCOSEU Negative 02/11/2013 0109   HGBUR NEGATIVE 04/08/2016 1429   BILIRUBINUR Negative 12/16/2018 0853   BILIRUBINUR Negative 02/11/2013 0109   KETONESUR NEGATIVE 04/08/2016 1429   PROTEINUR Negative 12/16/2018 0853   PROTEINUR NEGATIVE 04/08/2016 1429   NITRITE Negative 12/16/2018 0853   NITRITE NEGATIVE 04/08/2016 1429   LEUKOCYTESUR Negative 12/16/2018 0853   LEUKOCYTESUR Negative 02/11/2013 0109    I have reviewed the labs.   Pertinent Imaging: Results for CARALINE, DEUTSCHMAN (MRN [id number]) as of 11/11/2019 11:36  Ref. Range 11/11/2019 11:20  Scan Result Unknown 86    Assessment & Plan:    1. Mixed Incontinence Reviewed fluid intake Not at goal with Detrol LA 4 mg daily - switch to oxybutynin 5 mg TID as new insurance requires step therapy - will see in 3 months  for OAB questionnaire and PVR - may need to switch to other medication if not at goal with the oxybutynin.  Return in about 3 months (around 02/09/2020) for PVR and OAB questionnaire.  These notes generated with voice recognition software. I apologize for typographical errors.  Zara Council, PA-C  [facility] [address]  [place], [place] [address] [phone]

## 2019-11-11 ENCOUNTER — Other Ambulatory Visit: Payer: Self-pay

## 2019-11-11 ENCOUNTER — Encounter: Payer: Self-pay | Admitting: Urology

## 2019-11-11 ENCOUNTER — Ambulatory Visit (INDEPENDENT_AMBULATORY_CARE_PROVIDER_SITE_OTHER): Payer: Medicare HMO | Admitting: Urology

## 2019-11-11 VITALS — BP 142/89 | HR 98 | Ht 65.0 in

## 2019-11-11 DIAGNOSIS — N3946 Mixed incontinence: Secondary | ICD-10-CM

## 2019-11-11 LAB — BLADDER SCAN AMB NON-IMAGING: Scan Result: 86

## 2019-11-11 MED ORDER — OXYBUTYNIN CHLORIDE 5 MG PO TABS: 5.0000 mg | ORAL_TABLET | Freq: Three times a day (TID) | ORAL | 3 refills | Status: DC

## 2020-02-10 ENCOUNTER — Ambulatory Visit: Payer: Self-pay | Admitting: Urology

## 2020-02-24 ENCOUNTER — Ambulatory Visit: Payer: Self-pay | Admitting: Urology

## 2020-02-24 ENCOUNTER — Encounter: Payer: Self-pay | Admitting: Urology

## 2020-02-24 NOTE — Progress Notes (Incomplete)
02/24/20 01:00 PM  Kristine Rangel 01-13-1956 [id number]  Referring provider: Leanna Sato, MD [address] [place],  [place] [address]  No chief complaint on file.   HPI: Kristine Rangel is a 64 year old female who returns today for the evaluation and management of OAB.  The most recent PVR is 86 mL from 11/11/19.   PMH: Past Medical History:  Diagnosis Date   Anxiety    Arthritis    Asthma    Bladder leak    Cerebral aneurysm    history-has coils   CHF (congestive heart failure) (HCC)    Chronic kidney disease    Depression    Diabetes mellitus without complication (HCC)    Dyspnea    GERD (gastroesophageal reflux disease)    Glaucoma    Headache    History of kidney stones    Hyperlipidemia    Hypertension    MI (myocardial infarction) (HCC)    unsure when   Neuropathy    Rotator cuff injury    Seasonal allergies    Stroke [facility])    TIA's    Surgical History: Past Surgical History:  Procedure Laterality Date   BRAIN SURGERY Right 2010   anuerysm repair   CARPAL TUNNEL RELEASE Right 06/12/2017   Procedure: CARPAL TUNNEL RELEASE;  Surgeon: Juanell Fairly, MD;  Location: [facility] ORS;  Service: Orthopedics;  Laterality: Right;   CHOLECYSTECTOMY     JOINT REPLACEMENT Bilateral    TOTAL KNEE REPLACEMENT   SHOULDER ARTHROSCOPY WITH OPEN ROTATOR CUFF REPAIR Left 11/23/2016   Procedure: SHOULDER ARTHROSCOPY WITH OPEN ROTATOR CUFF REPAIR, ARTHROSCOPIC SUBACROMIAL DECOMPRESSION, DISTAL CLAVICLE EXCISION;  Surgeon: Juanell Fairly, MD;  Location: [facility] ORS;  Service: Orthopedics;  Laterality: Left;   SHOULDER ARTHROSCOPY WITH OPEN ROTATOR CUFF REPAIR Right 07/30/2018   Procedure: SHOULDER ARTHROSCOPY WITH OPEN ROTATOR CUFF REPAIR,SUBACROMINAL DECOMPRESSION AND DISTAL CLAVICLE EXCISION;  Surgeon: Juanell Fairly, MD;  Location: [facility] ORS;  Service: Orthopedics;  Laterality: Right;    Home Medications:  Allergies as of 02/24/2020    Reactions   Bee Venom Anaphylaxis   Penicillins Hives, Swelling, Other (See Comments)   Has patient had a PCN reaction causing immediate rash, facial/tongue/throat swelling, SOB or lightheadedness with hypotension: Yes Has patient had a PCN reaction causing severe rash involving mucus membranes or skin necrosis: No Has patient had a PCN reaction that required hospitalization No Has patient had a PCN reaction occurring within the last 10 years: No If all of the above answers are "NO", then may proceed with Cephalosporin use.      Medication List       Accurate as of February 24, 2020 12:28 PM. If you have any questions, ask your nurse or doctor.        albuterol (2.5 MG/3ML) 0.083% nebulizer solution Commonly known as: PROVENTIL Take 2.5 mg by nebulization every 6 (six) hours as needed for wheezing or shortness of breath.   ALPRAZolam 0.5 MG tablet Commonly known as: XANAX alprazolam 0.5 mg tablet   aspirin EC 81 MG tablet Take 81 mg by mouth daily.   atorvastatin 40 MG tablet Commonly known as: LIPITOR Take 40 mg by mouth at bedtime.   Azopt 1 % ophthalmic suspension Generic drug: brinzolamide Azopt 1 % eye drops,suspension   cetirizine 10 MG tablet Commonly known as: ZYRTEC Take 10 mg by mouth daily.   chlorhexidine 0.12 % solution Commonly known as: PERIDEX chlorhexidine gluconate 0.12 % mouthwash   Combigan 0.2-0.5 %  ophthalmic solution Generic drug: brimonidine-timolol Place 1 drop into both eyes 2 (two) times daily.   cyclobenzaprine 10 MG tablet Commonly known as: FLEXERIL cyclobenzaprine 10 mg tablet  Take 1 tablet(s) every 8 hours by oral route.   DULoxetine 60 MG capsule Commonly known as: CYMBALTA Take 60 mg by mouth at bedtime.   EPINEPHrine 0.3 mg/0.3 mL Soaj injection Commonly known as: EPI-PEN Inject 0.3 mg into the skin once as needed (allergic reactions).   esomeprazole 40 MG capsule Commonly known as: NEXIUM Take 40 mg by mouth daily.     Flucelvax Quadrivalent Susp injection Generic drug: Influenza Vac Subunit Quad Flucelvax Quad 2019-2020 60 mcg (15 mcg x 4)/0.5 mL intramuscular susp   fluticasone 50 MCG/ACT nasal spray Commonly known as: FLONASE fluticasone propionate 50 mcg/actuation nasal spray,suspension   furosemide 40 MG tablet Commonly known as: LASIX Take 40 mg by mouth 2 (two) times daily.   gabapentin 300 MG capsule Commonly known as: NEURONTIN Take 300 mg by mouth 3 (three) times daily as needed (pain).   latanoprost 0.005 % ophthalmic solution Commonly known as: XALATAN Place 1 drop into both eyes at bedtime.   meloxicam 7.5 MG tablet Commonly known as: MOBIC   oxybutynin 5 MG tablet Commonly known as: DITROPAN Take 1 tablet (5 mg total) by mouth 3 (three) times daily.   potassium chloride 10 MEQ tablet Commonly known as: KLOR-CON Take 10 mEq by mouth daily.   solifenacin 10 MG tablet Commonly known as: VESICARE Take 10 mg by mouth daily.   Symbicort 160-4.5 MCG/ACT inhaler Generic drug: budesonide-formoterol Inhale 2 puffs into the lungs 2 (two) times daily.   tiZANidine 4 MG tablet Commonly known as: ZANAFLEX Take 2-4 mg by mouth every 6 (six) hours as needed for muscle spasms.   tolterodine 4 MG 24 hr capsule Commonly known as: Detrol LA Take 1 capsule (4 mg total) by mouth daily.   topiramate 100 MG tablet Commonly known as: TOPAMAX Take 100 mg by mouth at bedtime.   traMADol 50 MG tablet Commonly known as: ULTRAM Take by mouth.       Allergies:  Allergies  Allergen Reactions   Bee Venom Anaphylaxis   Penicillins Hives, Swelling and Other (See Comments)    Has patient had a PCN reaction causing immediate rash, facial/tongue/throat swelling, SOB or lightheadedness with hypotension: Yes Has patient had a PCN reaction causing severe rash involving mucus membranes or skin necrosis: No Has patient had a PCN reaction that required hospitalization No Has patient had a  PCN reaction occurring within the last 10 years: No If all of the above answers are "NO", then may proceed with Cephalosporin use.    Family History: Family History  Problem Relation Age of Onset   Breast cancer Maternal Aunt     Social History:  reports that she quit smoking about 32 years ago. Her smokeless tobacco use includes snuff. She reports that she does not drink alcohol or use drugs.   Physical Exam: There were no vitals taken for this visit.  Constitutional:  Alert and oriented, No acute distress. HEENT: [place] AT, moist mucus membranes.  Trachea midline, no masses. Cardiovascular: No clubbing, cyanosis, or edema. Respiratory: Normal respiratory effort, no increased work of breathing. GI: Abdomen is soft, nontender, nondistended, no abdominal masses GU: No CVA tenderness Lymph: No cervical or inguinal lymphadenopathy. Skin: No rashes, bruises or suspicious lesions. Neurologic: Grossly intact, no focal deficits, moving all 4 extremities. Psychiatric: Normal mood and affect.  Laboratory  Data: Lab Results  Component Value Date   WBC 6.8 12/04/2018   HGB 12.8 12/04/2018   HCT 40.9 12/04/2018   MCV 85.2 12/04/2018   PLT 381 12/04/2018    Lab Results  Component Value Date   CREATININE 0.83 12/04/2018    No results found for: PSA  No results found for: TESTOSTERONE  Lab Results  Component Value Date   HGBA1C 6.1 (H) 07/24/2018    Urinalysis    Component Value Date/Time   COLORURINE YELLOW (A) 04/08/2016 1429   APPEARANCEUR Cloudy (A) 12/16/2018 0853   LABSPEC 1.019 04/08/2016 1429   LABSPEC 1.027 02/11/2013 0109   PHURINE 7.0 04/08/2016 1429   GLUCOSEU Negative 12/16/2018 0853   GLUCOSEU Negative 02/11/2013 0109   HGBUR NEGATIVE 04/08/2016 1429   BILIRUBINUR Negative 12/16/2018 0853   BILIRUBINUR Negative 02/11/2013 0109   KETONESUR NEGATIVE 04/08/2016 1429   PROTEINUR Negative 12/16/2018 0853   PROTEINUR NEGATIVE 04/08/2016 1429   NITRITE Negative  12/16/2018 0853   NITRITE NEGATIVE 04/08/2016 1429   LEUKOCYTESUR Negative 12/16/2018 0853   LEUKOCYTESUR Negative 02/11/2013 0109    Lab Results  Component Value Date   LABMICR See below: 12/16/2018   WBCUA None seen 12/16/2018   RBCUA None seen 12/16/2018   LABEPIT 0-10 12/16/2018   MUCUS Present (A) 12/16/2018   BACTERIA Moderate (A) 12/16/2018    Pertinent Imaging: *** No results found for this or any previous visit. No results found for this or any previous visit. No results found for this or any previous visit. No results found for this or any previous visit. No results found for this or any previous visit. No results found for this or any previous visit. No results found for this or any previous visit. No results found for this or any previous visit.  Assessment & Plan:      No follow-ups on file.  [facility] [address], [place], [place] [address] [phone]  I, Noland Fordyce, am acting as a Education administrator for [employer].

## 2020-03-01 ENCOUNTER — Other Ambulatory Visit: Payer: Self-pay | Admitting: Orthopedic Surgery

## 2020-03-01 DIAGNOSIS — Z96651 Presence of right artificial knee joint: Secondary | ICD-10-CM

## 2020-03-08 ENCOUNTER — Other Ambulatory Visit: Payer: Self-pay | Admitting: Urology

## 2020-03-12 ENCOUNTER — Other Ambulatory Visit: Payer: Self-pay

## 2020-03-12 ENCOUNTER — Ambulatory Visit
Admission: RE | Admit: 2020-03-12 | Discharge: 2020-03-12 | Disposition: A | Discharge status: Home / Self Care | Payer: Medicare HMO | Source: Ambulatory Visit | Attending: Orthopedic Surgery | Admitting: Orthopedic Surgery

## 2020-03-12 ENCOUNTER — Encounter
Admission: RE | Admit: 2020-03-12 | Discharge: 2020-03-12 | Disposition: A | Discharge status: Home / Self Care | Payer: Medicare HMO | Source: Ambulatory Visit | Attending: Orthopedic Surgery | Admitting: Orthopedic Surgery

## 2020-03-12 DIAGNOSIS — Z96651 Presence of right artificial knee joint: Secondary | ICD-10-CM | POA: Diagnosis present

## 2020-03-12 MED ORDER — TECHNETIUM TC 99M MEDRONATE IV KIT
20.0000 | PACK | Freq: Once | INTRAVENOUS | Status: AC | PRN
  Administered 2020-03-12: 10:00:00 22.91 via INTRAVENOUS

## 2020-03-16 ENCOUNTER — Encounter: Payer: Self-pay | Admitting: Urology

## 2020-03-16 ENCOUNTER — Ambulatory Visit (INDEPENDENT_AMBULATORY_CARE_PROVIDER_SITE_OTHER): Payer: Medicare HMO | Admitting: Urology

## 2020-03-16 ENCOUNTER — Other Ambulatory Visit: Payer: Self-pay

## 2020-03-16 VITALS — BP 123/80 | HR 77 | Ht 65.0 in | Wt 279.0 lb

## 2020-03-16 DIAGNOSIS — N3946 Mixed incontinence: Secondary | ICD-10-CM

## 2020-03-16 LAB — URINALYSIS, COMPLETE
Bilirubin, UA: NEGATIVE
Glucose, UA: NEGATIVE
Ketones, UA: NEGATIVE
Leukocytes,UA: NEGATIVE
Nitrite, UA: NEGATIVE
Protein,UA: NEGATIVE
RBC, UA: NEGATIVE
Specific Gravity, UA: 1.02 (ref 1.005–1.030)
Urobilinogen, Ur: 0.2 mg/dL (ref 0.2–1.0)
pH, UA: 5 (ref 5.0–7.5)

## 2020-03-16 LAB — MICROSCOPIC EXAMINATION: Epithelial Cells (non renal): >10 /hpf — AB (ref 0–10)

## 2020-03-16 LAB — BLADDER SCAN AMB NON-IMAGING: Scan Result: 20

## 2020-03-16 MED ORDER — GEMTESA 75 MG PO TABS: 1.0000 | ORAL_TABLET | Freq: Every day | ORAL | 0 refills | Status: DC

## 2020-03-16 NOTE — Progress Notes (Signed)
02/24/20 01:00 PM  VIA ROSADO 13-Jul-1956 [id number]  Referring provider: Leanna Sato, MD [address] [place],  [place] [address]  Chief Complaint  Patient presents with  . Urinary Incontinence    HPI: Kristine Rangel is a 64 year old female with mixed incontinence who presents today for a follow-up.  The patient is  experiencing urgency x 4-7 (stable), frequency x 4-7 (stable), not restricting fluids to avoid visits to the restroom, is engaging in toilet mapping, incontinence x 0-3 (improved) and nocturia x 0-3 (improved).   Her BP is 123/80. Her PVR is 20 mL.  UA is unremarkable.  Patient denies any modifying or aggravating factors.  Patient denies any gross hematuria, dysuria or suprapubic/flank pain.  Patient denies any fevers, chills, nausea or vomiting.  She is currently taking oxybutynin XL 10 mg daily.   She is experiencing a dry mouth.   PMH: Past Medical History:  Diagnosis Date  . Anxiety   . Arthritis   . Asthma   . Bladder leak   . Cerebral aneurysm    history-has coils  . CHF (congestive heart failure) (HCC)   . Chronic kidney disease   . Depression   . Diabetes mellitus without complication (HCC)   . Dyspnea   . GERD (gastroesophageal reflux disease)   . Glaucoma   . Headache   . History of kidney stones   . Hyperlipidemia   . Hypertension   . MI (myocardial infarction) (HCC)    unsure when  . Neuropathy   . Rotator cuff injury   . Seasonal allergies   . Stroke [facility])    TIA's    Surgical History: Past Surgical History:  Procedure Laterality Date  . BRAIN SURGERY Right 2010   anuerysm repair  . CARPAL TUNNEL RELEASE Right 06/12/2017   Procedure: CARPAL TUNNEL RELEASE;  Surgeon: Juanell Fairly, MD;  Location: [facility] ORS;  Service: Orthopedics;  Laterality: Right;  . CHOLECYSTECTOMY    . JOINT REPLACEMENT Bilateral    TOTAL KNEE REPLACEMENT  . SHOULDER ARTHROSCOPY WITH OPEN ROTATOR CUFF REPAIR Left 11/23/2016   Procedure: SHOULDER  ARTHROSCOPY WITH OPEN ROTATOR CUFF REPAIR, ARTHROSCOPIC SUBACROMIAL DECOMPRESSION, DISTAL CLAVICLE EXCISION;  Surgeon: Juanell Fairly, MD;  Location: [facility] ORS;  Service: Orthopedics;  Laterality: Left;  . SHOULDER ARTHROSCOPY WITH OPEN ROTATOR CUFF REPAIR Right 07/30/2018   Procedure: SHOULDER ARTHROSCOPY WITH OPEN ROTATOR CUFF REPAIR,SUBACROMINAL DECOMPRESSION AND DISTAL CLAVICLE EXCISION;  Surgeon: Juanell Fairly, MD;  Location: [facility] ORS;  Service: Orthopedics;  Laterality: Right;    Home Medications:  Current Outpatient Medications on File Prior to Visit  Medication Sig Dispense Refill  . albuterol (PROVENTIL) (2.5 MG/3ML) 0.083% nebulizer solution Take 2.5 mg by nebulization every 6 (six) hours as needed for wheezing or shortness of breath.     . ALPRAZolam (XANAX) 0.5 MG tablet alprazolam 0.5 mg tablet    . aspirin EC 81 MG tablet Take 81 mg by mouth daily.    Marland Kitchen atorvastatin (LIPITOR) 40 MG tablet Take 40 mg by mouth at bedtime.    . brimonidine-timolol (COMBIGAN) 0.2-0.5 % ophthalmic solution Place 1 drop into both eyes 2 (two) times daily.    . brinzolamide (AZOPT) 1 % ophthalmic suspension Azopt 1 % eye drops,suspension    . cetirizine (ZYRTEC) 10 MG tablet Take 10 mg by mouth daily.    . chlorhexidine (PERIDEX) 0.12 % solution chlorhexidine gluconate 0.12 % mouthwash    . DULoxetine (CYMBALTA) 60 MG capsule Take 60 mg  by mouth at bedtime.     Marland Kitchen EPINEPHrine 0.3 mg/0.3 mL IJ SOAJ injection Inject 0.3 mg into the skin once as needed (allergic reactions).     . esomeprazole (NEXIUM) 40 MG capsule Take 40 mg by mouth daily.    . fluticasone (FLONASE) 50 MCG/ACT nasal spray fluticasone propionate 50 mcg/actuation nasal spray,suspension    . furosemide (LASIX) 40 MG tablet Take 40 mg by mouth 2 (two) times daily.     Marland Kitchen gabapentin (NEURONTIN) 300 MG capsule Take 300 mg by mouth 3 (three) times daily as needed (pain).    . Influenza Vac Subunit Quad (FLUCELVAX QUADRIVALENT) SUSP injection  Flucelvax Quad 2019-2020 60 mcg (15 mcg x 4)/0.5 mL intramuscular susp    . meloxicam (MOBIC) 7.5 MG tablet     . oxybutynin (DITROPAN-XL) 10 MG 24 hr tablet     . potassium chloride (K-DUR) 10 MEQ tablet Take 10 mEq by mouth daily.    . SYMBICORT 160-4.5 MCG/ACT inhaler Inhale 2 puffs into the lungs 2 (two) times daily.    Marland Kitchen tiZANidine (ZANAFLEX) 4 MG tablet Take 2-4 mg by mouth every 6 (six) hours as needed for muscle spasms.     Marland Kitchen topiramate (TOPAMAX) 100 MG tablet Take 100 mg by mouth at bedtime.     . traMADol (ULTRAM) 50 MG tablet Take by mouth.     No current facility-administered medications on file prior to visit.    Allergies:  Allergies  Allergen Reactions  . Bee Venom Anaphylaxis  . Penicillins Hives, Swelling and Other (See Comments)    Has patient had a PCN reaction causing immediate rash, facial/tongue/throat swelling, SOB or lightheadedness with hypotension: Yes Has patient had a PCN reaction causing severe rash involving mucus membranes or skin necrosis: No Has patient had a PCN reaction that required hospitalization No Has patient had a PCN reaction occurring within the last 10 years: No If all of the above answers are "NO", then may proceed with Cephalosporin use.    Family History: Family History  Problem Relation Age of Onset  . Breast cancer Maternal Aunt     Social History:  reports that she quit smoking about 32 years ago. Her smokeless tobacco use includes snuff. She reports that she does not drink alcohol or use drugs.   Physical Exam: BP 123/80   Pulse 77   Ht 5\' 5"  (1.651 m)   Wt 279 lb (126.6 kg)   BMI 46.43 kg/m   Constitutional:  Well nourished. Alert and oriented, No acute distress. HEENT: [place] AT, mask in place.  Trachea midline. Cardiovascular: No clubbing, cyanosis, or edema. Respiratory: Normal respiratory effort, no increased work of breathing. Neurologic: Grossly intact, no focal deficits, moving all 4 extremities. In wheel chair.    Psychiatric: Normal mood and affect.   Laboratory Data: Lab Results  Component Value Date   WBC 6.8 12/04/2018   HGB 12.8 12/04/2018   HCT 40.9 12/04/2018   MCV 85.2 12/04/2018   PLT 381 12/04/2018    Lab Results  Component Value Date   CREATININE 0.83 12/04/2018    Lab Results  Component Value Date   HGBA1C 6.1 (H) 07/24/2018    Urinalysis Component     Latest Ref Rng & Units 03/16/2020  Specific Gravity, UA     1.005 - 1.030 1.020  pH, UA     5.0 - 7.5 5.0  Color, UA     Yellow Yellow  Appearance Ur     Clear Hazy (  A)  Leukocytes,UA     Negative Negative  Protein,UA     Negative/Trace Negative  Glucose, UA     Negative Negative  Ketones, UA     Negative Negative  RBC, UA     Negative Negative  Bilirubin, UA     Negative Negative  Urobilinogen, Ur     0.2 - 1.0 mg/dL 0.2  Nitrite, UA     Negative Negative  Microscopic Examination      See below:   Component     Latest Ref Rng & Units 03/16/2020  WBC, UA     0 - 5 /hpf 0-5  RBC     0 - 2 /hpf 0-2  Epithelial Cells (non renal)     0 - 10 /hpf >10 (A)  Casts     None seen /lpf Present (A)  Cast Type     N/A Hyaline casts  Bacteria, UA     None seen/Few Few    Pertinent Imaging: Results for CHANIYAH, JAHR (MRN [id number]) as of 04/11/2020 12:08  Ref. Range 03/16/2020 11:08  Scan Result Unknown 20    Assessment & Plan:    1. Mixed incontinence Not at goal with oxybutynin XL 10 mg daily and experiencing dry mouth We will have a trial of Gemtesa 75 mg daily, # 28 samples RTC in three weeks for OAB questionnaire and PVR    Return in about 3 weeks (around 04/06/2020) for PVR and OAB questionnaire.  Zara Council, PA-C  [facility] [address] [place], [place] [address] [phone]

## 2020-04-23 ENCOUNTER — Encounter
Admission: RE | Admit: 2020-04-23 | Discharge: 2020-04-23 | Disposition: A | Discharge status: Home / Self Care | Payer: Medicare HMO | Source: Ambulatory Visit | Attending: Orthopedic Surgery | Admitting: Orthopedic Surgery

## 2020-04-23 ENCOUNTER — Other Ambulatory Visit: Payer: Self-pay | Admitting: Orthopedic Surgery

## 2020-04-23 ENCOUNTER — Other Ambulatory Visit: Payer: Self-pay

## 2020-04-23 HISTORY — DX: Chronic obstructive pulmonary disease, unspecified: J44.9

## 2020-04-23 NOTE — Patient Instructions (Signed)
Your procedure is scheduled on: 05/05/20 Report to [place]. To find out your arrival time please call [phone] between 1PM - 3PM on 05/04/20.  Remember: Instructions that are not followed completely may result in serious medical risk, up to and including death, or upon the discretion of your surgeon and anesthesiologist your surgery may need to be rescheduled.     _X__ 1. Do not eat food after midnight the night before your procedure.                 No gum chewing or hard candies. You may drink clear liquids up to 2 hours                 before you are scheduled to arrive for your surgery- DO not drink clear                 liquids within 2 hours of the start of your surgery.                 Clear Liquids include:  water, apple juice without pulp, clear carbohydrate                 drink such as Clearfast or Gatorade, Black Coffee or Tea (Do not add                 anything to coffee or tea). Diabetics water only  __X__2.  On the morning of surgery brush your teeth with toothpaste and water, you                 may rinse your mouth with mouthwash if you wish.  Do not swallow any              toothpaste of mouthwash.     _X__ 3.  No Alcohol for 24 hours before or after surgery.   _X__ 4.  Do Not Smoke or use e-cigarettes For 24 Hours Prior to Your Surgery.                 Do not use any chewable tobacco products for at least 6 hours prior to                 surgery.  ____  5.  Bring all medications with you on the day of surgery if instructed.   __X__  6.  Notify your doctor if there is any change in your medical condition      (cold, fever, infections).     Do not wear jewelry, make-up, hairpins, clips or nail polish. Do not wear lotions, powders, or perfumes.  Do not shave 48 hours prior to surgery. Men may shave face and neck. Do not bring valuables to the hospital.    [facility] is not responsible for any belongings or  valuables.  Contacts, dentures/partials or body piercings may not be worn into surgery. Bring a case for your contacts, glasses or hearing aids, a denture cup will be supplied. Leave your suitcase in the car. After surgery it may be brought to your room. For patients admitted to the hospital, discharge time is determined by your treatment team.   Patients discharged the day of surgery will not be allowed to drive home.   Please read over the following fact sheets that you were given:   MRSA Information  __X__ Take these medicines the morning of surgery with A SIP OF WATER:  1. cetirizine (ZYRTEC) 10 MG tablet  2. esomeprazole (NEXIUM) 40 MG capsule  3. gabapentin (NEURONTIN) 300 MG capsule  4. traMADol (ULTRAM) 50 MG tablet if needed  5.  6.  ____ Fleet Enema (as directed)   __X__ Use CHG Soap/SAGE wipes as directed  __X__ Use inhalers on the day of surgery  ____ Stop metformin/Janumet/Farxiga 2 days prior to surgery    ____ Take 1/2 of usual insulin dose the night before surgery. No insulin the morning          of surgery.   ____ Stop Blood Thinners Coumadin/Plavix/Xarelto/Pleta/Pradaxa/Eliquis/Effient/Aspirin  on   Or contact your Surgeon, Cardiologist or Medical Doctor regarding  ability to stop your blood thinners  __X__ Stop Anti-inflammatories 7 days before surgery such as Advil, Ibuprofen, Motrin,  BC or Goodies Powder, Naprosyn, Naproxen, Aleve, Aspirin    __X__ Stop all herbal supplements, fish oil or vitamin E until after surgery.    ____ Bring C-Pap to the hospital.   STOP MELOXICAM 7 DAYS BEFORE SURGERY

## 2020-04-27 ENCOUNTER — Encounter
Admission: RE | Admit: 2020-04-27 | Discharge: 2020-04-27 | Disposition: A | Discharge status: Home / Self Care | Payer: Medicare HMO | Source: Ambulatory Visit | Attending: Orthopedic Surgery | Admitting: Orthopedic Surgery

## 2020-04-27 ENCOUNTER — Other Ambulatory Visit: Payer: Self-pay

## 2020-04-27 DIAGNOSIS — Z01818 Encounter for other preprocedural examination: Secondary | ICD-10-CM | POA: Insufficient documentation

## 2020-04-27 DIAGNOSIS — Z01810 Encounter for preprocedural cardiovascular examination: Secondary | ICD-10-CM | POA: Diagnosis not present

## 2020-04-27 LAB — CBC
HCT: 37.7 % (ref 36.0–46.0)
Hemoglobin: 12.1 g/dL (ref 12.0–15.0)
MCH: 27.1 pg (ref 26.0–34.0)
MCHC: 32.1 g/dL (ref 30.0–36.0)
MCV: 84.3 fL (ref 80.0–100.0)
Platelets: 363 10*3/uL (ref 150–400)
RBC: 4.47 MIL/uL (ref 3.87–5.11)
RDW: 15.2 % (ref 11.5–15.5)
WBC: 4.1 10*3/uL (ref 4.0–10.5)
nRBC: 0 % (ref 0.0–0.2)

## 2020-04-27 LAB — URINALYSIS, ROUTINE W REFLEX MICROSCOPIC
Bilirubin Urine: NEGATIVE
Glucose, UA: NEGATIVE mg/dL
Hgb urine dipstick: NEGATIVE
Ketones, ur: NEGATIVE mg/dL
Leukocytes,Ua: NEGATIVE
Nitrite: NEGATIVE
Protein, ur: NEGATIVE mg/dL
Specific Gravity, Urine: 1.01 (ref 1.005–1.030)
pH: 6 (ref 5.0–8.0)

## 2020-04-27 LAB — BASIC METABOLIC PANEL
Anion gap: 11 (ref 5–15)
BUN: 11 mg/dL (ref 8–23)
CO2: 26 mmol/L (ref 22–32)
Calcium: 10 mg/dL (ref 8.9–10.3)
Chloride: 101 mmol/L (ref 98–111)
Creatinine, Ser: 0.93 mg/dL (ref 0.44–1.00)
GFR calc Af Amer: >60 mL/min (ref >60)
GFR calc non Af Amer: >60 mL/min (ref >60)
Glucose, Bld: 96 mg/dL (ref 70–99)
Potassium: 4 mmol/L (ref 3.5–5.1)
Sodium: 138 mmol/L (ref 135–145)

## 2020-04-27 LAB — APTT: aPTT: 36 seconds (ref 24–36)

## 2020-04-27 LAB — PROTIME-INR
INR: 1 (ref 0.8–1.2)
Prothrombin Time: 12.7 seconds (ref 11.4–15.2)

## 2020-04-27 LAB — SURGICAL PCR SCREEN
MRSA, PCR: NEGATIVE
Staphylococcus aureus: POSITIVE — AB

## 2020-04-28 LAB — TYPE AND SCREEN
ABO/RH(D): O POS
Antibody Screen: NEGATIVE

## 2020-05-03 ENCOUNTER — Other Ambulatory Visit
Admission: RE | Admit: 2020-05-03 | Discharge: 2020-05-03 | Disposition: A | Discharge status: Home / Self Care | Payer: Medicare HMO | Source: Ambulatory Visit | Attending: Orthopedic Surgery | Admitting: Orthopedic Surgery

## 2020-05-03 ENCOUNTER — Other Ambulatory Visit: Payer: Self-pay

## 2020-05-03 DIAGNOSIS — Z20822 Contact with and (suspected) exposure to covid-19: Secondary | ICD-10-CM | POA: Insufficient documentation

## 2020-05-03 DIAGNOSIS — Z01812 Encounter for preprocedural laboratory examination: Secondary | ICD-10-CM | POA: Insufficient documentation

## 2020-05-04 LAB — SARS CORONAVIRUS 2 (TAT 6-24 HRS): SARS Coronavirus 2: NEGATIVE

## 2020-05-05 ENCOUNTER — Ambulatory Visit: Payer: Medicare HMO

## 2020-05-05 ENCOUNTER — Ambulatory Visit: Payer: Medicare HMO | Admitting: Anesthesiology

## 2020-05-05 ENCOUNTER — Encounter: Payer: Self-pay | Admitting: Orthopedic Surgery

## 2020-05-05 ENCOUNTER — Encounter
Admission: AD | Disposition: A | Discharge status: Skilled Nursing Facility | Payer: Self-pay | Source: Home / Self Care | Attending: Orthopedic Surgery

## 2020-05-05 ENCOUNTER — Other Ambulatory Visit: Payer: Self-pay

## 2020-05-05 ENCOUNTER — Observation Stay
Admission: AD | Admit: 2020-05-05 | Discharge: 2020-05-07 | Disposition: A | Discharge status: Skilled Nursing Facility | Payer: Medicare HMO | Attending: Orthopedic Surgery | Admitting: Orthopedic Surgery

## 2020-05-05 ENCOUNTER — Inpatient Hospital Stay: Payer: Medicare HMO

## 2020-05-05 DIAGNOSIS — Z7951 Long term (current) use of inhaled steroids: Secondary | ICD-10-CM | POA: Diagnosis not present

## 2020-05-05 DIAGNOSIS — I130 Hypertensive heart and chronic kidney disease with heart failure and stage 1 through stage 4 chronic kidney disease, or unspecified chronic kidney disease: Secondary | ICD-10-CM | POA: Diagnosis not present

## 2020-05-05 DIAGNOSIS — J449 Chronic obstructive pulmonary disease, unspecified: Secondary | ICD-10-CM | POA: Diagnosis not present

## 2020-05-05 DIAGNOSIS — M1990 Unspecified osteoarthritis, unspecified site: Secondary | ICD-10-CM | POA: Diagnosis not present

## 2020-05-05 DIAGNOSIS — E1122 Type 2 diabetes mellitus with diabetic chronic kidney disease: Secondary | ICD-10-CM | POA: Diagnosis not present

## 2020-05-05 DIAGNOSIS — I252 Old myocardial infarction: Secondary | ICD-10-CM | POA: Diagnosis not present

## 2020-05-05 DIAGNOSIS — Z79899 Other long term (current) drug therapy: Secondary | ICD-10-CM | POA: Insufficient documentation

## 2020-05-05 DIAGNOSIS — F419 Anxiety disorder, unspecified: Secondary | ICD-10-CM | POA: Insufficient documentation

## 2020-05-05 DIAGNOSIS — Y792 Prosthetic and other implants, materials and accessory orthopedic devices associated with adverse incidents: Secondary | ICD-10-CM | POA: Insufficient documentation

## 2020-05-05 DIAGNOSIS — E785 Hyperlipidemia, unspecified: Secondary | ICD-10-CM | POA: Diagnosis not present

## 2020-05-05 DIAGNOSIS — Z880 Allergy status to penicillin: Secondary | ICD-10-CM | POA: Insufficient documentation

## 2020-05-05 DIAGNOSIS — Z09 Encounter for follow-up examination after completed treatment for conditions other than malignant neoplasm: Secondary | ICD-10-CM

## 2020-05-05 DIAGNOSIS — H409 Unspecified glaucoma: Secondary | ICD-10-CM | POA: Insufficient documentation

## 2020-05-05 DIAGNOSIS — F329 Major depressive disorder, single episode, unspecified: Secondary | ICD-10-CM | POA: Insufficient documentation

## 2020-05-05 DIAGNOSIS — I509 Heart failure, unspecified: Secondary | ICD-10-CM | POA: Insufficient documentation

## 2020-05-05 DIAGNOSIS — Z7901 Long term (current) use of anticoagulants: Secondary | ICD-10-CM | POA: Insufficient documentation

## 2020-05-05 DIAGNOSIS — Z96651 Presence of right artificial knee joint: Secondary | ICD-10-CM

## 2020-05-05 DIAGNOSIS — Z87892 Personal history of anaphylaxis: Secondary | ICD-10-CM | POA: Insufficient documentation

## 2020-05-05 DIAGNOSIS — Z791 Long term (current) use of non-steroidal anti-inflammatories (NSAID): Secondary | ICD-10-CM | POA: Insufficient documentation

## 2020-05-05 DIAGNOSIS — Z87891 Personal history of nicotine dependence: Secondary | ICD-10-CM | POA: Diagnosis not present

## 2020-05-05 DIAGNOSIS — T84032A Mechanical loosening of internal right knee prosthetic joint, initial encounter: Secondary | ICD-10-CM | POA: Diagnosis present

## 2020-05-05 DIAGNOSIS — Z91030 Bee allergy status: Secondary | ICD-10-CM | POA: Insufficient documentation

## 2020-05-05 DIAGNOSIS — Z8673 Personal history of transient ischemic attack (TIA), and cerebral infarction without residual deficits: Secondary | ICD-10-CM | POA: Diagnosis not present

## 2020-05-05 DIAGNOSIS — Z96653 Presence of artificial knee joint, bilateral: Secondary | ICD-10-CM | POA: Insufficient documentation

## 2020-05-05 DIAGNOSIS — Z419 Encounter for procedure for purposes other than remedying health state, unspecified: Secondary | ICD-10-CM

## 2020-05-05 DIAGNOSIS — K219 Gastro-esophageal reflux disease without esophagitis: Secondary | ICD-10-CM | POA: Insufficient documentation

## 2020-05-05 DIAGNOSIS — N189 Chronic kidney disease, unspecified: Secondary | ICD-10-CM | POA: Insufficient documentation

## 2020-05-05 HISTORY — PX: TOTAL KNEE REVISION: SHX996

## 2020-05-05 LAB — ABO/RH: ABO/RH(D): O POS

## 2020-05-05 LAB — GLUCOSE, CAPILLARY
Glucose-Capillary: 90 mg/dL (ref 70–99)
Glucose-Capillary: 85 mg/dL (ref 70–99)

## 2020-05-05 SURGERY — TOTAL KNEE REVISION
Anesthesia: Spinal | Site: Knee | Laterality: Right

## 2020-05-05 MED ORDER — DIPHENHYDRAMINE HCL 12.5 MG/5ML PO ELIX: 12.5000 mg | ORAL_SOLUTION | ORAL | Status: DC | PRN

## 2020-05-05 MED ORDER — SODIUM CHLORIDE 0.9 % IV SOLN
INTRAVENOUS | Status: DC | PRN
  Administered 2020-05-05: 60 mL

## 2020-05-05 MED ORDER — ONDANSETRON HCL 4 MG/2ML IJ SOLN: 4.0000 mg | Freq: Four times a day (QID) | INTRAMUSCULAR | Status: DC | PRN

## 2020-05-05 MED ORDER — MENTHOL 3 MG MT LOZG
1.0000 | LOZENGE | OROMUCOSAL | Status: DC | PRN
  Filled 2020-05-05: qty 9

## 2020-05-05 MED ORDER — KETAMINE HCL 50 MG/ML IJ SOLN
INTRAMUSCULAR | Status: AC
  Filled 2020-05-05: qty 10

## 2020-05-05 MED ORDER — RIVAROXABAN 10 MG PO TABS
10.0000 mg | ORAL_TABLET | Freq: Every day | ORAL | Status: DC
  Administered 2020-05-06 – 2020-05-07 (×2): 10 mg via ORAL
  Filled 2020-05-05 (×2): qty 1

## 2020-05-05 MED ORDER — CLINDAMYCIN PHOSPHATE 600 MG/50ML IV SOLN
INTRAVENOUS | Status: AC
  Administered 2020-05-05: 600 mg via INTRAVENOUS
  Filled 2020-05-05: qty 50

## 2020-05-05 MED ORDER — HYDROCODONE-ACETAMINOPHEN 7.5-325 MG PO TABS
1.0000 | ORAL_TABLET | ORAL | Status: DC | PRN
  Administered 2020-05-06 – 2020-05-07 (×5): 2 via ORAL
  Filled 2020-05-05 (×7): qty 2

## 2020-05-05 MED ORDER — FENTANYL CITRATE (PF) 100 MCG/2ML IJ SOLN
INTRAMUSCULAR | Status: AC
  Administered 2020-05-05: 25 ug via INTRAVENOUS
  Filled 2020-05-05: qty 2

## 2020-05-05 MED ORDER — PHENOL 1.4 % MT LIQD
1.0000 | OROMUCOSAL | Status: DC | PRN
  Filled 2020-05-05: qty 177

## 2020-05-05 MED ORDER — ATORVASTATIN CALCIUM 20 MG PO TABS
40.0000 mg | ORAL_TABLET | Freq: Every day | ORAL | Status: DC
  Administered 2020-05-05 – 2020-05-07 (×3): 40 mg via ORAL
  Filled 2020-05-05 (×2): qty 2

## 2020-05-05 MED ORDER — SODIUM CHLORIDE 0.9 % IV SOLN: INTRAVENOUS | Status: DC | PRN

## 2020-05-05 MED ORDER — DOCUSATE SODIUM 100 MG PO CAPS
100.0000 mg | ORAL_CAPSULE | Freq: Two times a day (BID) | ORAL | Status: DC
  Administered 2020-05-05 – 2020-05-07 (×5): 100 mg via ORAL
  Filled 2020-05-05 (×4): qty 1

## 2020-05-05 MED ORDER — BISACODYL 10 MG RE SUPP: 10.0000 mg | Freq: Every day | RECTAL | Status: DC | PRN

## 2020-05-05 MED ORDER — SODIUM CHLORIDE FLUSH 0.9 % IV SOLN
INTRAVENOUS | Status: AC
  Filled 2020-05-05: qty 60

## 2020-05-05 MED ORDER — BUPIVACAINE LIPOSOME 1.3 % IJ SUSP
INTRAMUSCULAR | Status: AC
  Filled 2020-05-05: qty 20

## 2020-05-05 MED ORDER — FENTANYL CITRATE (PF) 100 MCG/2ML IJ SOLN
25.0000 ug | INTRAMUSCULAR | Status: DC | PRN
  Administered 2020-05-05 (×5): 25 ug via INTRAVENOUS

## 2020-05-05 MED ORDER — BUPIVACAINE-EPINEPHRINE (PF) 0.5% -1:200000 IJ SOLN
INTRAMUSCULAR | Status: AC
  Filled 2020-05-05: qty 30

## 2020-05-05 MED ORDER — PROPOFOL 500 MG/50ML IV EMUL
INTRAVENOUS | Status: DC | PRN
  Administered 2020-05-05 (×2): 50 ug/kg/min via INTRAVENOUS

## 2020-05-05 MED ORDER — TIZANIDINE HCL 2 MG PO TABS
2.0000 mg | ORAL_TABLET | Freq: Four times a day (QID) | ORAL | Status: DC | PRN
  Filled 2020-05-05: qty 2

## 2020-05-05 MED ORDER — MOMETASONE FURO-FORMOTEROL FUM 200-5 MCG/ACT IN AERO
2.0000 | INHALATION_SPRAY | Freq: Two times a day (BID) | RESPIRATORY_TRACT | Status: DC
  Administered 2020-05-05 – 2020-05-07 (×4): 2 via RESPIRATORY_TRACT
  Filled 2020-05-05: qty 8.8

## 2020-05-05 MED ORDER — ACETAMINOPHEN 500 MG PO TABS
500.0000 mg | ORAL_TABLET | Freq: Four times a day (QID) | ORAL | Status: AC
  Administered 2020-05-05 – 2020-05-06 (×3): 500 mg via ORAL
  Filled 2020-05-05 (×3): qty 1

## 2020-05-05 MED ORDER — HYDROCODONE-ACETAMINOPHEN 5-325 MG PO TABS
1.0000 | ORAL_TABLET | ORAL | Status: DC | PRN
  Administered 2020-05-06: 2 via ORAL
  Filled 2020-05-05: qty 2

## 2020-05-05 MED ORDER — CLINDAMYCIN PHOSPHATE 900 MG/50ML IV SOLN
INTRAVENOUS | Status: AC
  Filled 2020-05-05: qty 50

## 2020-05-05 MED ORDER — FENTANYL CITRATE (PF) 100 MCG/2ML IJ SOLN: 50.0000 ug | Freq: Once | INTRAMUSCULAR | Status: AC

## 2020-05-05 MED ORDER — FLUTICASONE PROPIONATE 50 MCG/ACT NA SUSP
1.0000 | Freq: Every day | NASAL | Status: DC
  Administered 2020-05-07: 1 via NASAL
  Filled 2020-05-05: qty 16

## 2020-05-05 MED ORDER — CLINDAMYCIN PHOSPHATE 900 MG/50ML IV SOLN
900.0000 mg | INTRAVENOUS | Status: AC
  Administered 2020-05-05: 900 mg via INTRAVENOUS

## 2020-05-05 MED ORDER — METOCLOPRAMIDE HCL 5 MG/ML IJ SOLN
INTRAMUSCULAR | Status: AC
  Filled 2020-05-05: qty 2

## 2020-05-05 MED ORDER — CHLORHEXIDINE GLUCONATE CLOTH 2 % EX PADS: 6.0000 | MEDICATED_PAD | Freq: Every day | CUTANEOUS | Status: DC

## 2020-05-05 MED ORDER — ALUM & MAG HYDROXIDE-SIMETH 200-200-20 MG/5ML PO SUSP: 30.0000 mL | ORAL | Status: DC | PRN

## 2020-05-05 MED ORDER — METOCLOPRAMIDE HCL 5 MG/ML IJ SOLN: 5.0000 mg | Freq: Three times a day (TID) | INTRAMUSCULAR | Status: DC | PRN

## 2020-05-05 MED ORDER — GABAPENTIN 300 MG PO CAPS
300.0000 mg | ORAL_CAPSULE | Freq: Three times a day (TID) | ORAL | Status: DC
  Administered 2020-05-05 – 2020-05-07 (×5): 300 mg via ORAL
  Filled 2020-05-05 (×5): qty 1

## 2020-05-05 MED ORDER — MAGNESIUM CITRATE PO SOLN
1.0000 | Freq: Once | ORAL | Status: DC | PRN
  Filled 2020-05-05: qty 296

## 2020-05-05 MED ORDER — EPINEPHRINE PF 1 MG/ML IJ SOLN
INTRAMUSCULAR | Status: AC
  Filled 2020-05-05: qty 1

## 2020-05-05 MED ORDER — LIDOCAINE-EPINEPHRINE 1 %-1:100000 IJ SOLN
INTRAMUSCULAR | Status: DC | PRN
Start: 2020-05-05 — End: 2020-05-05
  Administered 2020-05-05: .8 mL

## 2020-05-05 MED ORDER — MIDAZOLAM HCL 2 MG/2ML IJ SOLN
INTRAMUSCULAR | Status: AC
  Administered 2020-05-05: 1 mg via INTRAVENOUS
  Filled 2020-05-05: qty 2

## 2020-05-05 MED ORDER — METOCLOPRAMIDE HCL 10 MG PO TABS: 5.0000 mg | ORAL_TABLET | Freq: Three times a day (TID) | ORAL | Status: DC | PRN

## 2020-05-05 MED ORDER — ACETAMINOPHEN 325 MG PO TABS
325.0000 mg | ORAL_TABLET | Freq: Four times a day (QID) | ORAL | Status: DC | PRN
  Filled 2020-05-05: qty 1

## 2020-05-05 MED ORDER — LIDOCAINE HCL (PF) 1 % IJ SOLN
INTRAMUSCULAR | Status: AC
  Filled 2020-05-05: qty 5

## 2020-05-05 MED ORDER — GLYCOPYRROLATE 0.2 MG/ML IJ SOLN
INTRAMUSCULAR | Status: DC | PRN
  Administered 2020-05-05: .2 mg via INTRAVENOUS

## 2020-05-05 MED ORDER — LATANOPROST 0.005 % OP SOLN
1.0000 [drp] | Freq: Every day | OPHTHALMIC | Status: DC
  Administered 2020-05-05 – 2020-05-06 (×2): 1 [drp] via OPHTHALMIC
  Filled 2020-05-05: qty 2.5

## 2020-05-05 MED ORDER — POTASSIUM CHLORIDE CRYS ER 10 MEQ PO TBCR
10.0000 meq | EXTENDED_RELEASE_TABLET | Freq: Every day | ORAL | Status: DC
  Administered 2020-05-06 – 2020-05-07 (×2): 10 meq via ORAL
  Filled 2020-05-05 (×2): qty 1

## 2020-05-05 MED ORDER — CHLORHEXIDINE GLUCONATE 0.12 % MT SOLN
OROMUCOSAL | Status: AC
  Administered 2020-05-05: 15 mL via OROMUCOSAL
  Filled 2020-05-05: qty 15

## 2020-05-05 MED ORDER — TRANEXAMIC ACID-NACL 1000-0.7 MG/100ML-% IV SOLN
1000.0000 mg | INTRAVENOUS | Status: AC
  Administered 2020-05-05: 1000 mg via INTRAVENOUS

## 2020-05-05 MED ORDER — KETOROLAC TROMETHAMINE 15 MG/ML IJ SOLN
INTRAMUSCULAR | Status: DC | PRN
Start: 2020-05-05 — End: 2020-05-05
  Administered 2020-05-05: 15 mg via INTRAVENOUS

## 2020-05-05 MED ORDER — EPHEDRINE SULFATE 50 MG/ML IJ SOLN
INTRAMUSCULAR | Status: DC | PRN
  Administered 2020-05-05 (×4): 2.5 mg via INTRAVENOUS

## 2020-05-05 MED ORDER — MORPHINE SULFATE (PF) 2 MG/ML IV SOLN
0.5000 mg | INTRAVENOUS | Status: DC | PRN
  Administered 2020-05-05: 1 mg via INTRAVENOUS
  Filled 2020-05-05: qty 1

## 2020-05-05 MED ORDER — HYDROCODONE-ACETAMINOPHEN 7.5-325 MG PO TABS
ORAL_TABLET | ORAL | Status: AC
  Administered 2020-05-05: 2 via ORAL
  Filled 2020-05-05: qty 2

## 2020-05-05 MED ORDER — SODIUM CHLORIDE 0.9 % IV SOLN
INTRAVENOUS | Status: DC | PRN
  Administered 2020-05-05: 25 ug/min via INTRAVENOUS

## 2020-05-05 MED ORDER — KETOROLAC TROMETHAMINE 15 MG/ML IJ SOLN
15.0000 mg | Freq: Four times a day (QID) | INTRAMUSCULAR | Status: AC
  Administered 2020-05-05 – 2020-05-06 (×4): 15 mg via INTRAVENOUS
  Filled 2020-05-05 (×4): qty 1

## 2020-05-05 MED ORDER — PANTOPRAZOLE SODIUM 40 MG PO TBEC
40.0000 mg | DELAYED_RELEASE_TABLET | Freq: Every day | ORAL | Status: DC
  Administered 2020-05-06 – 2020-05-07 (×3): 40 mg via ORAL
  Filled 2020-05-05 (×2): qty 1

## 2020-05-05 MED ORDER — ONDANSETRON HCL 4 MG PO TABS: 4.0000 mg | ORAL_TABLET | Freq: Four times a day (QID) | ORAL | Status: DC | PRN

## 2020-05-05 MED ORDER — LACTATED RINGERS IV SOLN: INTRAVENOUS | Status: DC

## 2020-05-05 MED ORDER — SODIUM CHLORIDE 0.9 % IR SOLN
Status: DC | PRN
  Administered 2020-05-05: 1000 mL

## 2020-05-05 MED ORDER — CHLORHEXIDINE GLUCONATE 0.12 % MT SOLN: 15.0000 mL | Freq: Once | OROMUCOSAL | Status: AC

## 2020-05-05 MED ORDER — POVIDONE-IODINE 10 % EX SWAB: 2.0000 "application " | Freq: Once | CUTANEOUS | Status: DC

## 2020-05-05 MED ORDER — METOCLOPRAMIDE HCL 5 MG/ML IJ SOLN
INTRAMUSCULAR | Status: DC | PRN
  Administered 2020-05-05: 10 mg via INTRAVENOUS

## 2020-05-05 MED ORDER — CLINDAMYCIN PHOSPHATE 600 MG/50ML IV SOLN
600.0000 mg | Freq: Four times a day (QID) | INTRAVENOUS | Status: AC
  Administered 2020-05-06: 600 mg via INTRAVENOUS
  Filled 2020-05-05 (×2): qty 50

## 2020-05-05 MED ORDER — PROPOFOL 500 MG/50ML IV EMUL
INTRAVENOUS | Status: AC
  Filled 2020-05-05: qty 50

## 2020-05-05 MED ORDER — ONDANSETRON HCL 4 MG/2ML IJ SOLN: 4.0000 mg | Freq: Once | INTRAMUSCULAR | Status: DC | PRN

## 2020-05-05 MED ORDER — ACETAMINOPHEN 10 MG/ML IV SOLN
INTRAVENOUS | Status: DC | PRN
  Administered 2020-05-05: 1000 mg via INTRAVENOUS

## 2020-05-05 MED ORDER — TIMOLOL MALEATE 0.5 % OP SOLN
1.0000 [drp] | Freq: Two times a day (BID) | OPHTHALMIC | Status: DC
  Administered 2020-05-06 – 2020-05-07 (×4): 1 [drp] via OPHTHALMIC
  Filled 2020-05-05 (×2): qty 5

## 2020-05-05 MED ORDER — ACETAMINOPHEN 10 MG/ML IV SOLN
INTRAVENOUS | Status: AC
  Filled 2020-05-05: qty 100

## 2020-05-05 MED ORDER — BRIMONIDINE TARTRATE 0.2 % OP SOLN
1.0000 [drp] | Freq: Two times a day (BID) | OPHTHALMIC | Status: DC
  Administered 2020-05-06 – 2020-05-07 (×4): 1 [drp] via OPHTHALMIC
  Filled 2020-05-05 (×2): qty 5

## 2020-05-05 MED ORDER — ROPIVACAINE HCL 5 MG/ML IJ SOLN
INTRAMUSCULAR | Status: DC | PRN
Start: 2020-05-05 — End: 2020-05-05
  Administered 2020-05-05: 30 mL via PERINEURAL

## 2020-05-05 MED ORDER — DULOXETINE HCL 60 MG PO CPEP
60.0000 mg | ORAL_CAPSULE | Freq: Every day | ORAL | Status: DC
  Administered 2020-05-05 – 2020-05-06 (×2): 60 mg via ORAL
  Filled 2020-05-05 (×3): qty 1

## 2020-05-05 MED ORDER — BUPIVACAINE HCL (PF) 0.5 % IJ SOLN
INTRAMUSCULAR | Status: DC | PRN
  Administered 2020-05-05: 30 mL

## 2020-05-05 MED ORDER — FUROSEMIDE 40 MG PO TABS
40.0000 mg | ORAL_TABLET | Freq: Two times a day (BID) | ORAL | Status: DC
  Administered 2020-05-06 – 2020-05-07 (×3): 40 mg via ORAL
  Filled 2020-05-05 (×3): qty 1

## 2020-05-05 MED ORDER — LORATADINE 10 MG PO TABS
10.0000 mg | ORAL_TABLET | Freq: Every day | ORAL | Status: DC
  Administered 2020-05-06 – 2020-05-07 (×2): 10 mg via ORAL
  Filled 2020-05-05 (×2): qty 1

## 2020-05-05 MED ORDER — MIDAZOLAM HCL 2 MG/2ML IJ SOLN: 1.0000 mg | Freq: Once | INTRAMUSCULAR | Status: AC

## 2020-05-05 MED ORDER — TRANEXAMIC ACID-NACL 1000-0.7 MG/100ML-% IV SOLN
INTRAVENOUS | Status: AC
  Filled 2020-05-05: qty 100

## 2020-05-05 MED ORDER — BUPIVACAINE HCL (PF) 0.5 % IJ SOLN
INTRAMUSCULAR | Status: DC | PRN
  Administered 2020-05-05: 3 mL

## 2020-05-05 MED ORDER — FENTANYL CITRATE (PF) 100 MCG/2ML IJ SOLN
INTRAMUSCULAR | Status: AC
  Administered 2020-05-05: 50 ug via INTRAVENOUS
  Filled 2020-05-05: qty 2

## 2020-05-05 MED ORDER — ORAL CARE MOUTH RINSE: 15.0000 mL | Freq: Once | OROMUCOSAL | Status: AC

## 2020-05-05 MED ORDER — SODIUM CHLORIDE 0.9 % IV SOLN: INTRAVENOUS | Status: DC

## 2020-05-05 MED ORDER — KETAMINE HCL 50 MG/ML IJ SOLN
INTRAMUSCULAR | Status: DC | PRN
Start: 2020-05-05 — End: 2020-05-05
  Administered 2020-05-05: 40 mg via INTRAMUSCULAR

## 2020-05-05 MED ORDER — BRIMONIDINE TARTRATE-TIMOLOL 0.2-0.5 % OP SOLN
1.0000 [drp] | Freq: Two times a day (BID) | OPHTHALMIC | Status: DC
  Filled 2020-05-05: qty 5

## 2020-05-05 MED ORDER — BACITRACIN 50000 UNITS IM SOLR
INTRAMUSCULAR | Status: AC
  Filled 2020-05-05: qty 2

## 2020-05-05 MED ORDER — ROPIVACAINE HCL 5 MG/ML IJ SOLN
INTRAMUSCULAR | Status: AC
  Filled 2020-05-05: qty 30

## 2020-05-05 MED ORDER — OXYBUTYNIN CHLORIDE ER 5 MG PO TB24
10.0000 mg | ORAL_TABLET | Freq: Every day | ORAL | Status: DC
  Administered 2020-05-05 – 2020-05-06 (×2): 10 mg via ORAL
  Filled 2020-05-05 (×2): qty 2

## 2020-05-05 SURGICAL SUPPLY — 74 items
AUGMENT DISTAL FEM SZ6 4 KNEE (Miscellaneous) ×4 IMPLANT
AUGMENT POST FEM SZ6 4 KNEE (Miscellaneous) ×4 IMPLANT
BLADE SAW 90X13X1.19 OSCILLAT (BLADE) ×2 IMPLANT
BLADE SAW 90X25X1.19 OSCILLAT (BLADE) ×2 IMPLANT
BLADE SAW GIGLI 510 (BLADE) ×2 IMPLANT
BRUSH SCRUB EZ  4% CHG (MISCELLANEOUS) ×1
BRUSH SCRUB EZ 4% CHG (MISCELLANEOUS) ×1 IMPLANT
CANISTER SUCT 1200ML W/VALVE (MISCELLANEOUS) ×2 IMPLANT
CANISTER SUCT 3000ML PPV (MISCELLANEOUS) ×4 IMPLANT
CEMENT BONE 40GM (Cement) ×4 IMPLANT
CEMENT HV SMART SET (Cement) ×2 IMPLANT
CHLORAPREP W/TINT 26 (MISCELLANEOUS) ×4 IMPLANT
COOLER POLAR GLACIER W/PUMP (MISCELLANEOUS) ×2 IMPLANT
COVER WAND RF STERILE (DRAPES) ×2 IMPLANT
CUFF TOURN SGL QUICK 24 (TOURNIQUET CUFF)
CUFF TOURN SGL QUICK 30 (TOURNIQUET CUFF)
CUFF TOURN SGL QUICK 42 (TOURNIQUET CUFF) ×2 IMPLANT
CUFF TRNQT CYL 24X4X16.5-23 (TOURNIQUET CUFF) IMPLANT
CUFF TRNQT CYL 30X4X21-28X (TOURNIQUET CUFF) IMPLANT
DRAPE 3/4 80X56 (DRAPES) ×2 IMPLANT
DRAPE EXTREMITY 106X87X128.5 (DRAPES) ×2 IMPLANT
DRAPE INCISE IOBAN 66X60 STRL (DRAPES) ×2 IMPLANT
DRSG AQUACEL AG ADV 3.5X14 (GAUZE/BANDAGES/DRESSINGS) ×2 IMPLANT
FEM KNEE ATTUNE REV CRS SZ6 RT (Orthopedic Implant) ×2 IMPLANT
FEMORAL KNE ATTN REV CRS SZ6RT (Orthopedic Implant) ×1 IMPLANT
GAUZE XEROFORM 1X8 LF (GAUZE/BANDAGES/DRESSINGS) ×2 IMPLANT
GLOVE INDICATOR 8.0 STRL GRN (GLOVE) ×2 IMPLANT
GLOVE SURG ORTHO 8.0 STRL STRW (GLOVE) ×4 IMPLANT
GOWN STRL REUS W/ TWL LRG LVL3 (GOWN DISPOSABLE) ×2 IMPLANT
GOWN STRL REUS W/ TWL XL LVL3 (GOWN DISPOSABLE) ×1 IMPLANT
GOWN STRL REUS W/TWL LRG LVL3 (GOWN DISPOSABLE) ×2
GOWN STRL REUS W/TWL XL LVL3 (GOWN DISPOSABLE) ×1
HOLDER FOLEY CATH W/STRAP (MISCELLANEOUS) ×2 IMPLANT
HOOD PEEL AWAY FLYTE STAYCOOL (MISCELLANEOUS) ×8 IMPLANT
INSERT TIB CMT ATTUNE RP SZ3 (Knees) ×2 IMPLANT
INSERT TIB CRS ATTUNE SZ6 14 (Insert) ×2 IMPLANT
IV NS 1000ML (IV SOLUTION) ×1
IV NS 1000ML BAXH (IV SOLUTION) ×1 IMPLANT
KIT TURNOVER KIT A (KITS) ×2 IMPLANT
LABEL OR SOLS (LABEL) ×2 IMPLANT
MAT ABSORB  FLUID 56X50 GRAY (MISCELLANEOUS) ×1
MAT ABSORB FLUID 56X50 GRAY (MISCELLANEOUS) ×1 IMPLANT
NDL SAFETY ECLIPSE 18X1.5 (NEEDLE) ×1 IMPLANT
NEEDLE HYPO 18GX1.5 SHARP (NEEDLE) ×1
NEEDLE SPNL 20GX3.5 QUINCKE YW (NEEDLE) ×2 IMPLANT
NS IRRIG 1000ML POUR BTL (IV SOLUTION) ×2 IMPLANT
PACK TOTAL KNEE (MISCELLANEOUS) ×2 IMPLANT
PAD DE MAYO PRESSURE PROTECT (MISCELLANEOUS) ×2 IMPLANT
PAD WRAPON POLAR KNEE (MISCELLANEOUS) IMPLANT
PAD WRAPON POLOR MULTI XL (MISCELLANEOUS) ×1 IMPLANT
PATELLA DOME PFC 35MM (Knees) ×2 IMPLANT
PENCIL SMOKE EVACUATOR (MISCELLANEOUS) ×2 IMPLANT
PIN FIXATION 1/8DIA X 3INL (PIN) ×2 IMPLANT
PULSAVAC PLUS IRRIG FAN TIP (DISPOSABLE) ×2
SLEEVE FEM ATTUNE FP 35 (Knees) ×2 IMPLANT
SLEEVE KNEE ATTUNE 29MM (Knees) ×2 IMPLANT
SPONGE LAP 18X18 RF (DISPOSABLE) ×6 IMPLANT
STAPLER SKIN PROX 35W (STAPLE) ×2 IMPLANT
STEM STR ATTUNE PF 14X60 (Knees) ×2 IMPLANT
STEM STR ATTUNE PF 16X60 (Knees) ×2 IMPLANT
SUCTION FRAZIER HANDLE 10FR (MISCELLANEOUS) ×2
SUCTION TUBE FRAZIER 10FR DISP (MISCELLANEOUS) ×2 IMPLANT
SUT DVC 2 QUILL PDO  T11 36X36 (SUTURE) ×1
SUT DVC 2 QUILL PDO T11 36X36 (SUTURE) ×1 IMPLANT
SUT VIC AB 2-0 CT1 18 (SUTURE) ×2 IMPLANT
SUT VIC AB 2-0 CT1 27 (SUTURE) ×1
SUT VIC AB 2-0 CT1 TAPERPNT 27 (SUTURE) ×1 IMPLANT
SUT VIC AB PLUS 45CM 1-MO-4 (SUTURE) ×2 IMPLANT
SYR 30ML LL (SYRINGE) ×4 IMPLANT
TIP FAN IRRIG PULSAVAC PLUS (DISPOSABLE) ×1 IMPLANT
TRAY FOLEY MTR SLVR 16FR STAT (SET/KITS/TRAYS/PACK) ×2 IMPLANT
WRAP-ON POLOR PAD MULTI XL (MISCELLANEOUS) ×1
WRAPON POLAR PAD KNEE (MISCELLANEOUS)
WRAPON POLOR PAD MULTI XL (MISCELLANEOUS) ×1

## 2020-05-05 NOTE — Anesthesia Procedure Notes (Deleted)
Anesthesia Regional Block: Ankle block   Pre-Anesthetic Checklist: ,, timeout performed, Correct Patient, Correct Site, Correct Laterality, Correct Procedure, Correct Position, site marked, Risks and benefits discussed,  Surgical consent,  Pre-op evaluation,  At surgeon's request and post-op pain management  Laterality: Lower  Prep: chloraprep       Needles:  Injection technique: Single-shot  Needle Type: Other     Needle Length: 4cm  Needle Gauge: 25     Additional Needles:   Procedures:,,,, ultrasound used (permanent image in chart),,,,  Narrative:  Injection made incrementally with aspirations every 5 mL.  Performed by: Personally  Anesthesiologist: Piscitello, Cleda Mccreedy, MD  Additional Notes: Patient consented for risk and benefits of nerve block including but not limited to nerve damage, failed block, bleeding and infection.  Patient voiced understanding.  Functioning IV was confirmed and monitors were applied.  Timeout done prior to procedure and prior to any sedation being given to the patient.  Patient confirmed procedure site prior to any sedation given to the patient.  A 70mm 22ga Stimuplex needle was used. Sterile prep,hand hygiene and sterile gloves were used.  Minimal sedation used for procedure.  No paresthesia endorsed by patient during the procedure.  Negative aspiration and negative test dose prior to incremental administration of local anesthetic. The patient tolerated the procedure well with no immediate complications.

## 2020-05-05 NOTE — Transfer of Care (Signed)
Immediate Anesthesia Transfer of Care Note  Patient: Kristine Rangel  Procedure(s) Performed: right total knee arthroplasty revision (Right Knee)  Patient Location: PACU  Anesthesia Type:Spinal  Level of Consciousness: awake, alert  and oriented  Airway & Oxygen Therapy: Patient Spontanous Breathing  Post-op Assessment: Report given to RN and Post -op Vital signs reviewed and stable  Post vital signs: Reviewed and stable  Last Vitals:  Vitals Value Taken Time  BP 94/62 05/05/20 1505  Temp    Pulse 77 05/05/20 1506  Resp 21 05/05/20 1506  SpO2 98 % 05/05/20 1506  Vitals shown include unvalidated device data.  Last Pain:  Vitals:   05/05/20 0936  TempSrc: Temporal  PainSc: 7       Patients Stated Pain Goal: 0 (05/05/20 0936)  Complications: No complications documented.

## 2020-05-05 NOTE — OR Nursing (Signed)
All previous sigma knee implants, explanted and disposed of in biohazard bin per MD.

## 2020-05-05 NOTE — H&P (Signed)
The patient has been re-examined, and the chart reviewed, and there have been no interval changes to the documented history and physical.  Plan a right knee revision arthroplasty today.  Anesthesia is consulted regarding a peripheral nerve block for post-operative pain.  The risks, benefits, and alternatives have been discussed at length, and the patient is willing to proceed.

## 2020-05-05 NOTE — Op Note (Signed)
DATE OF SURGERY:  05/05/2020 TIME: 2:49 PM  PATIENT NAME:  Kristine Rangel   AGE: 64 y.o.    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  Radiographs have demonstrated a loose and subsided tibial component.  The risks, benefits, and alternatives were discussed at length including but not limited to the risks of infection, bleeding, nerve or blood vessel injury, knee stiffness, fracture, dislocation, loosening or failure of the hardware and the need for further surgery. Medical risks include but not limited to DVT and pulmonary embolism, myocardial infarction, stroke, pneumonia, respiratory failure and death. I discussed these risks with the patient in my office prior to the date of surgery. They understood these risks and were willing to proceed.  OPERATIVE FINDINGS AND UNIQUE ASPECTS OF THE CASE:  Gross loosening of the tibial and patellar component was identified. Given her BMI and potential for femoral loosening, a  decision was made to proceed with total knee revision arthroplasty of all components.   OPERATIVE DESCRIPTION:  The patient was brought to the operative room and placed in a supine position after undergoing placement of a general anesthetic. IV antibiotics were given. Patient received tranexamic acid. The lower extremity was prepped and draped in the usual sterile fashion.  A time out was performed to verify the patient's name, date of birth, medical record number, correct site of surgery and correct procedure to be performed. The timeout was also used to confirm the patient received antibiotics and that appropriate instruments, implants and radiographs studies were available in the room.  The leg was elevated and exsanguinated with an Esmarch and the tourniquet was inflated to 300 mmHg. The touniquet was deflated multiple times during the case for more than 15 minutes to limit the total tourniquet time.  The prior midline incision was made over the right knee. A medial parapatellar arthrotomy was then made and the patella subluxed laterally and the knee was brought into 90 of flexion. The medial and lateral gutters were debrided of scar tissue.  Attention was then turned to preparation of the patella. The prior component was delaminated. It was removed with osteotomes. The thickness of the patella was measured with a caliper, the diameter measured with the patella templates.  The patella resection was then made with an oscillating saw using the patella cutting guide.  The 35 mm button fit appropriately.  3 peg holes for the patella component were then drilled.  The liner was removed and next using thin osteotomes the femoral and tibial components were removed. Cement was removed with rongeur and  osteotomes.   The tibia was prepared with stems and sleeves. Next the femur was prepared with 4 mm augments med/lat post/dist.  The trials were then placed. Knee was taken through a full range of motion and  deemed to be stable with the trial components. All trial components were then removed.  The joint was copiously irrigated with pulse lavage.  The final total knee arthroplasty components were then cemented into place. The knee was held in extension while cement was allowed to cure.The knee was taken through a range of motion and the patella tracked well and the knee was again irrigated copiously.  The knee capsule was then injected with Exparel.  The medial arthrotomy was closed with #1 Vicryl and #2 Quill. The subcutaneous tissue closed with  2-0 vicryl, and skin approximated with staples.  A dry sterile and compressive dressing was applied.  A Polar Care was applied to the operative knee.  The patient was awakened and brought to the PACU in stable and satisfactory condition.  All sharp, lap and instrument counts were correct at the conclusion the case. I spoke with the patient's family in the postop consultation room to let them know the case had been performed without complication and the patient was stable in recovery room.   Total tourniquet time was 122 minutes.

## 2020-05-05 NOTE — Anesthesia Preprocedure Evaluation (Addendum)
Anesthesia Evaluation  Patient identified by MRN, date of birth, ID band Patient awake    Reviewed: Allergy & Precautions, NPO status , Patient's Chart, lab work & pertinent test results  History of Anesthesia Complications Negative for: history of anesthetic complications  Airway Mallampati: III       Dental   Pulmonary asthma , neg sleep apnea, COPD,  COPD inhaler, Not current smoker, former smoker,           Cardiovascular hypertension, Pt. on medications + Past MI  (-) CHF (-) dysrhythmias (-) Valvular Problems/Murmurs     Neuro/Psych neg Seizures Anxiety Depression CVA (Pt unaware of CVAs, noted on scan)    GI/Hepatic Neg liver ROS, GERD  Medicated and Controlled,  Endo/Other  diabetes, Type 2, Oral Hypoglycemic Agents  Renal/GU Renal InsufficiencyRenal disease     Musculoskeletal   Abdominal   Peds  Hematology   Anesthesia Other Findings   Reproductive/Obstetrics                             Anesthesia Physical Anesthesia Plan  ASA: III  Anesthesia Plan: Spinal   Post-op Pain Management:    Induction:   PONV Risk Score and Plan:   Airway Management Planned: Nasal Cannula  Additional Equipment:   Intra-op Plan:   Post-operative Plan:   Informed Consent: I have reviewed the patients History and Physical, chart, labs and discussed the procedure including the risks, benefits and alternatives for the proposed anesthesia with the patient or authorized representative who has indicated his/her understanding and acceptance.       Plan Discussed with:   Anesthesia Plan Comments:         Anesthesia Quick Evaluation

## 2020-05-05 NOTE — Anesthesia Procedure Notes (Signed)
Anesthesia Regional Block: Femoral nerve block   Pre-Anesthetic Checklist: ,, timeout performed, Correct Patient, Correct Site, Correct Laterality, Correct Procedure, Correct Position, site marked, Risks and benefits discussed,  Surgical consent,  Pre-op evaluation,  At surgeon's request and post-op pain management  Laterality: Lower and Right  Prep: chloraprep       Needles:  Injection technique: Single-shot  Needle Type: Echogenic Needle     Needle Length: 9cm  Needle Gauge: 21     Additional Needles:   Procedures:,,,, ultrasound used (permanent image in chart),,,,  Narrative:  Start time: 05/05/2020 10:17 AM End time: 05/05/2020 10:21 AM Injection made incrementally with aspirations every 5 mL.  Performed by: Personally  Anesthesiologist: Piscitello, Cleda Mccreedy, MD  Additional Notes: Patient consented for risk and benefits of nerve block including but not limited to nerve damage, failed block, bleeding and infection.  Patient voiced understanding.  Functioning IV was confirmed and monitors were applied.  Timeout done prior to procedure and prior to any sedation being given to the patient.  Patient confirmed procedure site prior to any sedation given to the patient.  A 48mm 22ga Stimuplex needle was used. Sterile prep and hand hygiene was used.  Minimal sedation used for procedure.  No paresthesia endorsed by patient during the procedure.  Negative aspiration and negative test dose prior to incremental administration of local anesthetic. The patient tolerated the procedure well with no immediate complications.

## 2020-05-06 ENCOUNTER — Encounter: Payer: Self-pay | Admitting: Orthopedic Surgery

## 2020-05-06 DIAGNOSIS — T84032A Mechanical loosening of internal right knee prosthetic joint, initial encounter: Secondary | ICD-10-CM | POA: Diagnosis not present

## 2020-05-06 LAB — BASIC METABOLIC PANEL
Anion gap: 7 (ref 5–15)
BUN: 26 mg/dL — ABNORMAL HIGH (ref 8–23)
CO2: 25 mmol/L (ref 22–32)
Calcium: 9.1 mg/dL (ref 8.9–10.3)
Chloride: 105 mmol/L (ref 98–111)
Creatinine, Ser: 1.49 mg/dL — ABNORMAL HIGH (ref 0.44–1.00)
GFR calc Af Amer: 42 mL/min — ABNORMAL LOW (ref >60)
GFR calc non Af Amer: 36 mL/min — ABNORMAL LOW (ref >60)
Glucose, Bld: 126 mg/dL — ABNORMAL HIGH (ref 70–99)
Potassium: 4.1 mmol/L (ref 3.5–5.1)
Sodium: 137 mmol/L (ref 135–145)

## 2020-05-06 LAB — CBC
HCT: 32.5 % — ABNORMAL LOW (ref 36.0–46.0)
Hemoglobin: 10.5 g/dL — ABNORMAL LOW (ref 12.0–15.0)
MCH: 27.7 pg (ref 26.0–34.0)
MCHC: 32.3 g/dL (ref 30.0–36.0)
MCV: 85.8 fL (ref 80.0–100.0)
Platelets: 289 10*3/uL (ref 150–400)
RBC: 3.79 MIL/uL — ABNORMAL LOW (ref 3.87–5.11)
RDW: 15.3 % (ref 11.5–15.5)
WBC: 8.1 10*3/uL (ref 4.0–10.5)
nRBC: 0 % (ref 0.0–0.2)

## 2020-05-06 NOTE — Evaluation (Signed)
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 PMH of asthma, COPD, former smoker, past MI, HTN, anxiety. depression, CVA, s/p brain surgery (anuerysm repair), DMII, renal disease, CHF, rotator cuff surgery, glaucoma.    Clinical Impression  Pt alert, agreeable to PT, reported pain 4/10 in R knee. Pt stated she at baseline needs assistance with bathing, dressing, pt uses rollator for ambulation, denied any falls in the last year. Pt aide will not be able to provide adequate support upon discharge including physical assist.   The patient demonstrated bed mobility with CGA and HOB elevated, reliant on bed rails. Sit <> Stand with RW and CGA-minA, pt with very wide BOS to complete transfer, minA to assist with maintaining balance and RW steadiness. The patient was able to take several steps to recliner but fatigued quickly, reported pain, poor eccentric control to descend to sitting.  Overall the patient demonstrated deficits (see "PT Problem List") that impede the patient's functional abilities, safety, and mobility and would benefit from skilled PT intervention. Recommendation is SNF due to level of assistance needed, to maximize functional mobility/safety, and decreased caregiver support.    Follow Up Recommendations SNF    Equipment Recommendations  Rolling walker with 5" wheels;Other (comment) (bariatric walker)    Recommendations for Other Services       Precautions / Restrictions Precautions Precautions: Fall;Knee Precaution Booklet Issued: No Restrictions Weight Bearing Restrictions: Yes RLE Weight Bearing: Weight bearing as tolerated      Mobility  Bed Mobility Overal bed mobility: Needs Assistance Bed Mobility: Supine to Sit     Supine to sit: HOB elevated;Min guard      General bed mobility comments: reliant on bed rails  Transfers Overall transfer level: Needs assistance Equipment used: Rolling walker (2 wheeled) Transfers: Sit to/from Stand Sit to Stand: Min assist         General transfer comment: pt uses very wide BOS to complete transfer, may benefit from bariatric walker  Ambulation/Gait Ambulation/Gait assistance: Min guard Gait Distance (Feet): 2 Feet Assistive device: Rolling walker (2 wheeled)       General Gait Details: Pt fatigued quickly and reported pain, some unsteadiness noted, further mobility deferred  Stairs            Wheelchair Mobility    Modified Rankin (Stroke Patients Only)       Balance Overall balance assessment: Needs assistance Sitting-balance support: Feet supported Sitting balance-Leahy Scale: Fair       Standing balance-Leahy Scale: Poor                               Pertinent Vitals/Pain Pain Assessment: 0-10 Pain Score: 5  Pain Location: R knee Pain Descriptors / Indicators: Aching;Guarding;Grimacing Pain Intervention(s): Limited activity within patient's tolerance;Monitored during session;Repositioned;Ice applied;Patient requesting pain meds-RN notified    Home Living Family/patient expects to be discharged to:: Private residence Living Arrangements: Alone Available Help at Discharge: Family;Personal care attendant Type of Home: Apartment Home Access: Level entry     Home Layout: One level Home Equipment: [place] - 2 wheels;Walker - 4 wheels;Tub bench;Grab bars - toilet;Grab bars - tub/shower;Bedside commode      Prior Function Level of Independence: Needs assistance   Gait / Transfers Assistance Needed: uses rollator for ambulation  ADL's / Homemaking Assistance Needed: PCA who assists daily ~8-5pm with ADL, meals, housekeeping,; indep med management  Comments: pt denies falls in the last year     Hand Dominance   Dominant Hand: Right    Extremity/Trunk  Assessment   Upper Extremity Assessment Upper Extremity Assessment: Overall WFL for tasks assessed    Lower Extremity Assessment Lower Extremity Assessment: Generalized weakness;RLE deficits/detail;LLE deficits/detail RLE Deficits / Details: s/p R knee revision LLE Deficits / Details: WFLs       Communication   Communication: No difficulties  Cognition Arousal/Alertness: Awake/alert Behavior During Therapy: WFL for tasks assessed/performed Overall Cognitive Status: Within Functional Limits for tasks assessed                                        General Comments      Exercises Total Joint Exercises Ankle Circles/Pumps: AROM;Both;10 reps Quad Sets: AROM;Both;10 reps Heel Slides: AAROM;Strengthening;Right;10 reps Hip ABduction/ADduction: AAROM;Strengthening;10 reps;Right   Assessment/Plan    PT Assessment Patient needs continued PT services  PT Problem List Decreased strength;Decreased mobility;Decreased range of motion;Decreased activity tolerance;Decreased balance;Pain       PT Treatment Interventions DME instruction;Therapeutic exercise;Gait training;Balance training;Stair training;Neuromuscular re-education;Functional mobility training;Therapeutic activities;Patient/family education    PT Goals (Current goals can be found in the Care Plan section)  Acute Rehab PT Goals Patient Stated Goal: to get stronger PT Goal Formulation: With patient Time For Goal Achievement: 05/20/20 Potential to Achieve Goals: Good    Frequency BID   Barriers to discharge Decreased caregiver support      Co-evaluation               AM-PAC PT "6 Clicks" Mobility  Outcome Measure Help needed turning from your back to your side while in a flat bed without using bedrails?: A Lot Help needed moving from lying on your back to sitting on the side of a flat bed without using bedrails?: A Lot Help needed moving to and from a bed to a chair (including a wheelchair)?: A  Little Help needed standing up from a chair using your arms (e.g., wheelchair or bedside chair)?: A Little Help needed to walk in hospital room?: A Lot Help needed climbing 3-5 steps with a railing? : Total 6 Click Score: 13    End of Session Equipment Utilized During Treatment: Gait belt Activity Tolerance: Patient tolerated treatment well;Patient limited by pain Patient left: in chair;with call bell/phone within reach;with SCD's reapplied;with chair alarm set Nurse Communication: Mobility status PT Visit Diagnosis: Unsteadiness on feet (R26.81);Other abnormalities of gait and mobility (R26.89);Muscle weakness (generalized) (M62.81);Difficulty in walking, not elsewhere classified (R26.2);Pain Pain - Right/Left: Right Pain - part of body: Knee    Time: 4098-1191 PT Time Calculation (min) (ACUTE ONLY): 27 min   Charges:   PT Evaluation $PT Eval Low Complexity: 1 Low PT Treatments $Therapeutic Exercise: 23-37 mins        Olga Coaster PT, DPT 12:21 PM,05/06/20

## 2020-05-06 NOTE — TOC Initial Note (Signed)
Transition of Care [facility]) - Initial/Assessment Note    Patient Details  Name: Kristine Rangel MRN: [id number] Date of Birth: [birth year]  Transition of Care [facility]) CM/SW Contact:    Elease Hashimoto, LCSW Phone Number: 05/06/2020, 10:07 AM  Clinical Narrative:  Met with pt who reports she is going to [facility] when she leaves here, due to she will need the rehab. She hopes to do well in PT today. She was doing well until her knee started giving her problems. She has a Physiological scientist who helps with home management and uses rw at home. Will begin bed search and SNF process in preparation for DC.                 Expected Discharge Plan: Skilled Nursing Facility Barriers to Discharge: Continued Medical Work up   Patient Goals and CMS Choice Patient states their goals for this hospitalization and ongoing recovery are:: I plan on going to [facility] I will need the rehab when I leave here CMS Medicare.gov Compare Post Acute Care list provided to:: Patient Choice offered to / list presented to : Patient  Expected Discharge Plan and Services Expected Discharge Plan: [place] In-house Referral: Clinical Social Work   Post Acute Care Choice: [place] Living arrangements for the past 2 months: Apartment                                      Prior Living Arrangements/Services Living arrangements for the past 2 months: Apartment Lives with:: Self Patient language and need for interpreter reviewed:: No Do you feel safe going back to the place where you live?: Yes      Need for Family Participation in Patient Care: Yes (Comment) Care giver support system in place?: No (comment) Current home services: DME, Homehealth aide (bsc, rw and tub seat along with PCS through [place]) Criminal Activity/Legal Involvement Pertinent to Current Situation/Hospitalization: No - Comment as needed  Activities of Daily Living Home Assistive Devices/Equipment: Cane (specify quad or  straight), Eyeglasses, Walker (specify type), Dentures (specify type), CBG Meter ADL Screening (condition at time of admission) Patient's cognitive ability adequate to safely complete daily activities?: Yes Is the patient deaf or have difficulty hearing?: No Does the patient have difficulty seeing, even when wearing glasses/contacts?: No Does the patient have difficulty concentrating, remembering, or making decisions?: No Patient able to express need for assistance with ADLs?: Yes Does the patient have difficulty dressing or bathing?: No Independently performs ADLs?: Yes (appropriate for developmental age) Does the patient have difficulty walking or climbing stairs?: Yes Weakness of Legs: Both Weakness of Arms/Hands: Right  Permission Sought/Granted Permission sought to share information with : Facility Sport and exercise psychologist, Family Supports Permission granted to share information with : Yes, Verbal Permission Granted  Share Information with NAME: Otila Kluver  Permission granted to share info w AGENCY: SNF  Permission granted to share info w Relationship: freind     Emotional Assessment Appearance:: Appears stated age Attitude/Demeanor/Rapport: Gracious Affect (typically observed): Accepting, Adaptable Orientation: : Oriented to Place, Oriented to  Time, Oriented to Situation, Oriented to Self   Psych Involvement: No (comment)  Admission diagnosis:  S/P revision of total knee, right [Z96.651] Patient Active Problem List   Diagnosis Date Noted  . 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 S/P right rotator cuff repair 07/30/2018  . S/P left rotator cuff repair 11/23/2016  .  Benign essential hypertension 02/05/2003   PCP:  Marguerita Merles, MD Pharmacy:   [facility] [facility], [address] [address] Phone: [phone] Fax: [place], [place] [address] [place] [address] Phone: [phone] Fax: [phone]     Social Determinants of Health (SDOH) Interventions    Readmission Risk Interventions No flowsheet data found.

## 2020-05-06 NOTE — Care Management CC44 (Signed)
Condition Code 44 Documentation Completed  Patient Details  Name: IZABELLAH DADISMAN MRN: [id number] Date of Birth: 1956/08/04   Condition Code 44 given:  Yes Patient signature on Condition Code 44 notice:  Yes Documentation of 2 MD's agreement:  Yes Code 44 added to claim:  Yes    Lucy Chris, LCSW 05/06/2020, 9:25 AM

## 2020-05-06 NOTE — Progress Notes (Signed)
Subjective:  Patient reports pain as mild to moderate.    Objective:   VITALS:   Vitals:   05/05/20 2244 05/06/20 0000 05/06/20 0105 05/06/20 0326  BP: 124/65 126/64 117/70 (!) 89/54  Pulse: 82 88 88 84  Resp: 17 16 17 16   Temp: 98 F (36.7 C) 97.8 F (36.6 C) 98.2 F (36.8 C) 97.8 F (36.6 C)  TempSrc: Oral Oral Oral Oral  SpO2: 97% 99% 95% 99%  Weight:      Height:        PHYSICAL EXAM:  Neurologically intact ABD soft Neurovascular intact Sensation intact distally Intact pulses distally Dorsiflexion/Plantar flexion intact Incision: dressing C/D/I No cellulitis present Compartment soft  LABS  Results for orders placed or performed during the hospital encounter of 05/05/20 (from the past 24 hour(s))  Glucose, capillary     Status: None   Collection Time: 05/05/20  9:19 AM  Result Value Ref Range   Glucose-Capillary 90 70 - 99 mg/dL  ABO/Rh     Status: None   Collection Time: 05/05/20 10:00 AM  Result Value Ref Range   ABO/RH(D)      O POS Performed at [facility], [address] [place], [place] [address]   Glucose, capillary     Status: None   Collection Time: 05/05/20  3:05 PM  Result Value Ref Range   Glucose-Capillary 85 70 - 99 mg/dL  CBC     Status: Abnormal   Collection Time: 05/06/20  5:56 AM  Result Value Ref Range   WBC 8.1 4.0 - 10.5 K/uL   RBC 3.79 (L) 3.87 - 5.11 MIL/uL   Hemoglobin 10.5 (L) 12.0 - 15.0 g/dL   HCT 32.5 (L) 36 - 46 %   MCV 85.8 80.0 - 100.0 fL   MCH 27.7 26.0 - 34.0 pg   MCHC 32.3 30.0 - 36.0 g/dL   RDW 15.3 11.5 - 15.5 %   Platelets 289 150 - 400 K/uL   nRBC 0.0 0.0 - 0.2 %  Basic metabolic panel     Status: Abnormal   Collection Time: 05/06/20  5:56 AM  Result Value Ref Range   Sodium 137 135 - 145 mmol/L   Potassium 4.1 3.5 - 5.1 mmol/L   Chloride 105 98 - 111 mmol/L   CO2 25 22 - 32 mmol/L   Glucose, Bld 126 (H) 70 - 99 mg/dL   BUN 26 (H) 8 - 23 mg/dL   Creatinine, Ser 1.49 (H) 0.44 - 1.00 mg/dL    Calcium 9.1 8.9 - 10.3 mg/dL   GFR calc non Af Amer 36 (L) >60 mL/min   GFR calc Af Amer 42 (L) >60 mL/min   Anion gap 7 5 - 15    DG Knee Right Port  Result Date: 05/05/2020 CLINICAL DATA:  Postop EXAM: PORTABLE RIGHT KNEE - 1-2 VIEW COMPARISON:  01/12/2017, bone scan 03/12/2020 FINDINGS: Cutaneous staples and small amount of soft tissue gas. Revision of right knee replacement with intact hardware and normal alignment. IMPRESSION: Right knee replacement with expected postsurgical change. Electronically Signed   By: Donavan Foil M.D.   On: 05/05/2020 17:39   [place] OR NERVE BLOCK-IMAGE ONLY [facility])  Result Date: 05/05/2020 There is no interpretation for this exam.  This order is for images obtained during a surgical procedure.  Please See "Surgeries" Tab for more information regarding the procedure.    Assessment/Plan: 1 Day Post-Op   Active Problems:   S/P revision of total knee, right  Advance diet Up with therapy   Discharge to SNF   Altamese Cabal , PA-C 05/06/2020, 7:37 AM

## 2020-05-06 NOTE — NC FL2 (Signed)
[place] MEDICAID FL2 LEVEL OF CARE SCREENING TOOL     IDENTIFICATION  Patient Name: Kristine Rangel Birthdate: Mar 28, 1956 Sex: female Admission Date (Current Location): 05/05/2020  [place] and [place] Number:  [address] [id number] [facility] Facility and Address:  [facility], [address] [place], [place] [address]      Provider Number: [id number]  Attending Physician Name and Address:  Lyndle Herrlich, MD  Relative Name and Phone Number:  Jyl Heinz  [phone]    Current Level of Care: Hospital Recommended Level of Care: Skilled Nursing Facility Prior Approval Number:    Date Approved/Denied:   PASRR Number: [id number]  Discharge Plan: SNF    Current Diagnoses: Patient Active Problem List   Diagnosis Date Noted  . 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 S/P right rotator cuff repair 07/30/2018  . S/P left rotator cuff repair 11/23/2016  . Benign essential hypertension 02/05/2003    Orientation RESPIRATION BLADDER Height & Weight     Self, Time, Situation, Place  Normal Continent Weight: 273 lb (123.8 kg) Height:  5\' 5"  (165.1 cm)  BEHAVIORAL SYMPTOMS/MOOD NEUROLOGICAL BOWEL NUTRITION STATUS      Continent Diet (regular thin liquids)  AMBULATORY STATUS COMMUNICATION OF NEEDS Skin   Limited Assist Verbally Surgical wounds                       Personal Care Assistance Level of Assistance  Bathing, Dressing Bathing Assistance: Limited assistance   Dressing Assistance: Limited assistance     Functional Limitations Info             SPECIAL CARE FACTORS FREQUENCY  PT (By licensed PT), OT (By licensed OT)     PT Frequency: 5x week OT Frequency: 3-5 x week            Contractures Contractures Info: Not present    Additional Factors Info  Code Status, Allergies Code Status Info: Full Code Allergies Info: Bee Venom and Pencillins           Current Medications (05/06/2020):  This is the current  hospital active medication list Current Facility-Administered Medications  Medication Dose Route Frequency Provider Last Rate Last Admin  . acetaminophen (TYLENOL) tablet 325-650 mg  325-650 mg Oral Q6H PRN 05/08/2020, MD      . acetaminophen (TYLENOL) tablet 500 mg  500 mg Oral Q6H Lyndle Herrlich, MD   500 mg at 05/06/20 0536  . alum & mag hydroxide-simeth (MAALOX/MYLANTA) 200-200-20 MG/5ML suspension 30 mL  30 mL Oral Q4H PRN 05-22-2001, MD      . atorvastatin (LIPITOR) tablet 40 mg  40 mg Oral QHS Lyndle Herrlich, MD   40 mg at 05/05/20 2041  . bisacodyl (DULCOLAX) suppository 10 mg  10 mg Rectal Daily PRN 2042, MD      . brimonidine (ALPHAGAN) 0.2 % ophthalmic solution 1 drop  1 drop Both Eyes BID Lyndle Herrlich, MD   1 drop at 05/06/20 0954   And  . timolol (TIMOPTIC) 0.5 % ophthalmic solution 1 drop  1 drop Both Eyes BID 05/08/20, MD   1 drop at 05/06/20 0954  . Chlorhexidine Gluconate Cloth 2 % PADS 6 each  6 each Topical Daily 05/08/20, MD      . diphenhydrAMINE (BENADRYL) 12.5 MG/5ML elixir 12.5-25 mg  12.5-25 mg Oral Q4H PRN 12-13-1984, MD      . docusate sodium (  COLACE) capsule 100 mg  100 mg Oral BID Lovell Sheehan, MD   100 mg at 05/06/20 0956  . DULoxetine (CYMBALTA) DR capsule 60 mg  60 mg Oral QHS Lovell Sheehan, MD   60 mg at 05/05/20 2041  . fluticasone (FLONASE) 50 MCG/ACT nasal spray 1 spray  1 spray Each Nare Daily Lovell Sheehan, MD      . furosemide (LASIX) tablet 40 mg  40 mg Oral BID Lovell Sheehan, MD   40 mg at 05/06/20 0956  . gabapentin (NEURONTIN) capsule 300 mg  300 mg Oral TID Lovell Sheehan, MD   300 mg at 05/06/20 0956  . HYDROcodone-acetaminophen (NORCO) 7.5-325 MG per tablet 1-2 tablet  1-2 tablet Oral Q4H PRN Lovell Sheehan, MD   2 tablet at 05/06/20 0250  . HYDROcodone-acetaminophen (NORCO/VICODIN) 5-325 MG per tablet 1-2 tablet  1-2 tablet Oral Q4H PRN Lovell Sheehan, MD      . ketorolac (TORADOL) 15 MG/ML  injection 15 mg  15 mg Intravenous Q6H Lovell Sheehan, MD   15 mg at 05/06/20 0956  . lactated ringers infusion   Intravenous Continuous Lovell Sheehan, MD 75 mL/hr at 05/05/20 2042 New Bag at 05/05/20 2042  . latanoprost (XALATAN) 0.005 % ophthalmic solution 1 drop  1 drop Both Eyes QHS Lovell Sheehan, MD   1 drop at 05/05/20 2040  . loratadine (CLARITIN) tablet 10 mg  10 mg Oral Daily Lovell Sheehan, MD   10 mg at 05/06/20 0956  . magnesium citrate solution 1 Bottle  1 Bottle Oral Once PRN Lovell Sheehan, MD      . menthol-cetylpyridinium (CEPACOL) lozenge 3 mg  1 lozenge Oral PRN Lovell Sheehan, MD       Or  . phenol (CHLORASEPTIC) mouth spray 1 spray  1 spray Mouth/Throat PRN Lovell Sheehan, MD      . metoCLOPramide (REGLAN) tablet 5-10 mg  5-10 mg Oral Q8H PRN Lovell Sheehan, MD       Or  . metoCLOPramide (REGLAN) injection 5-10 mg  5-10 mg Intravenous Q8H PRN Lovell Sheehan, MD      . mometasone-formoterol [facility]) 200-5 MCG/ACT inhaler 2 puff  2 puff Inhalation BID Lovell Sheehan, MD   2 puff at 05/06/20 0955  . morphine 2 MG/ML injection 0.5-1 mg  0.5-1 mg Intravenous Q2H PRN Lovell Sheehan, MD   1 mg at 05/05/20 2211  . ondansetron (ZOFRAN) tablet 4 mg  4 mg Oral Q6H PRN Lovell Sheehan, MD       Or  . ondansetron [facility]) injection 4 mg  4 mg Intravenous Q6H PRN Lovell Sheehan, MD      . oxybutynin (DITROPAN-XL) 24 hr tablet 10 mg  10 mg Oral QHS Lovell Sheehan, MD   10 mg at 05/05/20 2041  . pantoprazole (PROTONIX) EC tablet 40 mg  40 mg Oral Daily Lovell Sheehan, MD   40 mg at 05/06/20 0956  . potassium chloride (KLOR-CON) CR tablet 10 mEq  10 mEq Oral Daily Lovell Sheehan, MD   10 mEq at 05/06/20 0956  . rivaroxaban (XARELTO) tablet 10 mg  10 mg Oral Q breakfast Lovell Sheehan, MD   10 mg at 05/06/20 0955  . tiZANidine (ZANAFLEX) tablet 2-4 mg  2-4 mg Oral Q6H PRN Lovell Sheehan, MD         Discharge Medications: Please see discharge summary for a  list of  discharge medications.  Relevant Imaging Results:  Relevant Lab Results:   Additional Information SSN: [id number]  Tenley Winward, Lemar Livings, LCSW

## 2020-05-06 NOTE — Anesthesia Postprocedure Evaluation (Signed)
Anesthesia Post Note  Patient: Kristine Rangel  Procedure(s) Performed: right total knee arthroplasty revision (Right Knee)  Patient location during evaluation: Nursing Unit Anesthesia Type: Spinal Level of consciousness: oriented and awake and alert Pain management: pain level controlled Vital Signs Assessment: post-procedure vital signs reviewed and stable Respiratory status: spontaneous breathing and respiratory function stable Cardiovascular status: blood pressure returned to baseline and stable Postop Assessment: no headache, no backache, no apparent nausea or vomiting and patient able to bend at knees Anesthetic complications: no   No complications documented.   Last Vitals:  Vitals:   05/06/20 0742 05/06/20 1123  BP: (!) 117/53 131/63  Pulse: 86 94  Resp: 15 16  Temp: 36.8 C 36.7 C  SpO2: 99% 99%    Last Pain:  Vitals:   05/06/20 1439  TempSrc:   PainSc: 8                  Stormy Fabian A

## 2020-05-06 NOTE — TOC Progression Note (Addendum)
Transition of Care [facility]) - Progression Note    Patient Details  Name: Kristine Rangel MRN: [id number] Date of Birth: 07/14/1956  [facility] of [facility]) CM/SW Contact  Malky Rudzinski, Lemar Livings, LCSW Phone Number: 05/06/2020, 2:46 PM  Clinical Narrative:  Bed offer from [facility] the facility pt wanted to go to. Have contacted [employer] to start insurance auth and faxed clinicals. Ref number [id number]. Have asked MD to order COVID test and will follow up with discharge plan.  Pt has had her COVID vaccines.   Expected Discharge Plan: Skilled Nursing Facility Barriers to Discharge: Continued Medical Work up  Expected Discharge Plan and Services Expected Discharge Plan: Skilled Nursing Facility In-house Referral: Clinical Social Work   Post Acute Care Choice: Skilled Nursing Facility Living arrangements for the past 2 months: Apartment                                       Social Determinants of Health (SDOH) Interventions    Readmission Risk Interventions No flowsheet data found.

## 2020-05-06 NOTE — Progress Notes (Signed)
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 PMH of asthma, COPD, former smoker, past MI, HTN, anxiety. depression, CVA, s/p brain surgery (anuerysm repair), DMII, renal disease, CHF, rotator cuff surgery, glaucoma.    PT Comments    Pt alert, in bed, reported 8/10 knee pain but still motivated to work with PT. Pt able to perform supine exercises with physical assist though less assist needed than AM session. Supine <> sit with minA for RLE assistance. Fair sitting balance noted, and improved standing balance with bariatric walker, but still minA for unsteadiness and RW stabilization. Pt able to take a few steps but experienced mild buckling and significant pain, further mobility deferred. Pt in bed with all needs in reach, RN notified of pt status and pain level. The patient would benefit from further skilled PT intervention to continue to progress towards goals. Recommendation remains appropriate.     Follow Up Recommendations  SNF     Equipment Recommendations  Rolling walker with 5" wheels;Other (comment)    Recommendations for Other Services       Precautions / Restrictions Precautions Precautions: Fall;Knee Precaution Booklet Issued: No Restrictions Weight Bearing Restrictions: Yes RLE Weight Bearing: Weight bearing as tolerated    Mobility  Bed Mobility Overal bed mobility: Needs Assistance Bed Mobility: Supine to Sit;Sit to Supine     Supine to sit: HOB elevated;Min assist Sit to supine: Min assist;HOB elevated   General bed mobility comments: reliant on bed rails, minA for RLE assistance  Transfers Overall transfer level: Needs assistance Equipment used: Rolling walker (2 wheeled) Transfers: Sit to/from Stand Sit to Stand: Min assist         General transfer  comment: improved balance noted with bariatric walker, PT stabilization of RW still needed.  Ambulation/Gait Ambulation/Gait assistance: Min guard Gait Distance (Feet): 2 Feet Assistive device: Rolling walker (2 wheeled)       General Gait Details: Pt fatigued quickly and reported pain, some unsteadiness noted, further mobility deferred   Stairs             Wheelchair Mobility    Modified Rankin (Stroke Patients Only)       Balance Overall balance assessment: Needs assistance Sitting-balance support: Feet supported Sitting balance-Leahy Scale: Fair       Standing balance-Leahy Scale: Poor                              Cognition Arousal/Alertness: Awake/alert Behavior During Therapy: WFL for tasks assessed/performed Overall Cognitive Status: Within Functional Limits for tasks assessed                                        Exercises Total Joint Exercises Ankle Circles/Pumps: AROM;Both;10 reps Quad Sets: AROM;Both;10 reps Short Arc Quad: AAROM;Strengthening;Right;10 reps Heel Slides: AAROM;Strengthening;Right;10 reps Hip ABduction/ADduction: AAROM;Strengthening;10 reps;Right Goniometric ROM: 0-50deg    General Comments        Pertinent Vitals/Pain Pain Assessment: 0-10 Pain Score: 8  Pain Location: R knee Pain Descriptors / Indicators: Aching;Guarding;Grimacing;Throbbing Pain Intervention(s): Limited activity within patient's tolerance;Patient requesting pain meds-RN notified;Monitored during session;Repositioned;Ice applied    Home Living Family/patient expects to be discharged to:: Private residence Living  Arrangements: Alone Available Help at Discharge: Family;Personal care attendant Type of Home: Apartment Home Access: Level entry   Home Layout: One level Home Equipment: Walker - 2 wheels;Walker - 4 wheels;Tub bench;Grab bars - toilet;Grab bars - tub/shower;Bedside commode      Prior Function Level of  Independence: Needs assistance  Gait / Transfers Assistance Needed: uses rollator for ambulation ADL's / Homemaking Assistance Needed: PCA who assists daily ~8-5pm with ADL, meals, housekeeping,; indep med management Comments: pt denies falls in the last year   PT Goals (current goals can now be found in the care plan section) Acute Rehab PT Goals Patient Stated Goal: to get stronger PT Goal Formulation: With patient Time For Goal Achievement: 05/20/20 Potential to Achieve Goals: Good Progress towards PT goals: Progressing toward goals    Frequency    BID      PT Plan Current plan remains appropriate    Co-evaluation              AM-PAC PT "6 Clicks" Mobility   Outcome Measure  Help needed turning from your back to your side while in a flat bed without using bedrails?: A Lot Help needed moving from lying on your back to sitting on the side of a flat bed without using bedrails?: A Lot Help needed moving to and from a bed to a chair (including a wheelchair)?: A Little Help needed standing up from a chair using your arms (e.g., wheelchair or bedside chair)?: A Little Help needed to walk in hospital room?: A Lot Help needed climbing 3-5 steps with a railing? : Total 6 Click Score: 13    End of Session Equipment Utilized During Treatment: Gait belt Activity Tolerance: Patient tolerated treatment well;Patient limited by pain Patient left: in chair;with call bell/phone within reach;with SCD's reapplied;with chair alarm set Nurse Communication: Mobility status PT Visit Diagnosis: Unsteadiness on feet (R26.81);Other abnormalities of gait and mobility (R26.89);Muscle weakness (generalized) (M62.81);Difficulty in walking, not elsewhere classified (R26.2);Pain Pain - Right/Left: Right Pain - part of body: Knee     Time: 1335-1404 PT Time Calculation (min) (ACUTE ONLY): 29 min  Charges:  $Therapeutic Exercise: 23-37 mins                     Olga Coaster PT, DPT 2:55  PM,05/06/20

## 2020-05-07 DIAGNOSIS — T84032A Mechanical loosening of internal right knee prosthetic joint, initial encounter: Secondary | ICD-10-CM | POA: Diagnosis not present

## 2020-05-07 LAB — CBC
HCT: 29.3 % — ABNORMAL LOW (ref 36.0–46.0)
Hemoglobin: 9.6 g/dL — ABNORMAL LOW (ref 12.0–15.0)
MCH: 27.4 pg (ref 26.0–34.0)
MCHC: 32.8 g/dL (ref 30.0–36.0)
MCV: 83.7 fL (ref 80.0–100.0)
Platelets: 276 10*3/uL (ref 150–400)
RBC: 3.5 MIL/uL — ABNORMAL LOW (ref 3.87–5.11)
RDW: 15.1 % (ref 11.5–15.5)
WBC: 11.1 10*3/uL — ABNORMAL HIGH (ref 4.0–10.5)
nRBC: 0 % (ref 0.0–0.2)

## 2020-05-07 MED ORDER — DOCUSATE SODIUM 100 MG PO CAPS: 100.0000 mg | ORAL_CAPSULE | Freq: Two times a day (BID) | ORAL | 0 refills | Status: DC

## 2020-05-07 MED ORDER — ONDANSETRON HCL 4 MG PO TABS: 4.0000 mg | ORAL_TABLET | Freq: Four times a day (QID) | ORAL | 0 refills | Status: DC | PRN

## 2020-05-07 MED ORDER — HYDROCODONE-ACETAMINOPHEN 5-325 MG PO TABS: 1.0000 | ORAL_TABLET | ORAL | 0 refills | Status: DC | PRN

## 2020-05-07 MED ORDER — RIVAROXABAN 10 MG PO TABS: 10.0000 mg | ORAL_TABLET | Freq: Every day | ORAL | 0 refills | Status: DC

## 2020-05-07 NOTE — Progress Notes (Signed)
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 PMH of asthma, COPD, former smoker, past MI, HTN, anxiety. depression, CVA, s/p brain surgery (anuerysm repair), DMII, renal disease, CHF, rotator cuff surgery, glaucoma.    PT Comments    Pt alert, in bed reported 7/10 R knee pain. Pt able to perform supine exercises with physical assist, less assistance needed for heel slides compared to prior session, pt stated she has been practicing. Supine to sit with HOB elevated, minimal assistance for RLE assistance. The patient performed sit <> stand from EOB, BSC and to recliner, CGA, occasional steadying of RW needed. Pt exhibited antalgic step to gait pattern with RW and CGA during ambulation. Pt up in chair all needs in reach. The patient would benefit from further skilled PT intervention to continue to progress towards goals. Recommendation remains appropriate.    Follow Up Recommendations  SNF     Equipment Recommendations  Rolling walker with 5" wheels;Other (comment)    Recommendations for Other Services       Precautions / Restrictions Precautions Precautions: Fall;Knee Precaution Booklet Issued: Yes (comment) Restrictions Weight Bearing Restrictions: Yes RLE Weight Bearing: Weight bearing as tolerated    Mobility  Bed Mobility Overal bed mobility: Needs Assistance Bed Mobility: Supine to Sit     Supine to sit: Min assist;HOB elevated     General bed mobility comments: reliant on bed rails, minA for RLE assistance due to pain  Transfers Overall transfer level: Needs assistance Equipment used: Rolling walker (2 wheeled) Transfers: Sit to/from Stand Sit to Stand: Min guard         General transfer comment: from EOB and BSC  Ambulation/Gait Ambulation/Gait assistance: Min  guard Gait Distance (Feet): 8 Feet Assistive device: Rolling walker (2 wheeled)       General Gait Details: step to gait pattern, antalgic   Stairs             Wheelchair Mobility    Modified Rankin (Stroke Patients Only)       Balance Overall balance assessment: Needs assistance Sitting-balance support: Feet supported Sitting balance-Leahy Scale: Fair       Standing balance-Leahy Scale: Fair Standing balance comment: able to stand and wipe with single UE support                            Cognition Arousal/Alertness: Awake/alert Behavior During Therapy: WFL for tasks assessed/performed Overall Cognitive Status: Within Functional Limits for tasks assessed                                        Exercises Total Joint Exercises Ankle Circles/Pumps: AROM;Both;10 reps Quad Sets: AROM;Both;10 reps Short Arc Quad: AAROM;Strengthening;Right;10 reps Heel Slides: AAROM;Strengthening;Right;10 reps Straight Leg Raises: AAROM;Strengthening;Right;10 reps    General Comments        Pertinent Vitals/Pain Pain Assessment: 0-10 Pain Score: 7  Pain Location: R knee Pain Descriptors / Indicators: Aching;Guarding;Grimacing;Throbbing Pain Intervention(s): Limited activity within patient's tolerance;Monitored during session;Premedicated before session;Repositioned;Ice applied    Home Living                      Prior Function  PT Goals (current goals can now be found in the care plan section) Progress towards PT goals: Progressing toward goals    Frequency    BID      PT Plan Current plan remains appropriate    Co-evaluation              AM-PAC PT "6 Clicks" Mobility   Outcome Measure  Help needed turning from your back to your side while in a flat bed without using bedrails?: A Lot Help needed moving from lying on your back to sitting on the side of a flat bed without using bedrails?: A Lot Help  needed moving to and from a bed to a chair (including a wheelchair)?: A Little Help needed standing up from a chair using your arms (e.g., wheelchair or bedside chair)?: A Little Help needed to walk in hospital room?: A Little Help needed climbing 3-5 steps with a railing? : Total 6 Click Score: 14    End of Session Equipment Utilized During Treatment: Gait belt Activity Tolerance: Patient tolerated treatment well;Patient limited by pain Patient left: in chair;with call bell/phone within reach;with SCD's reapplied;with chair alarm set Nurse Communication: Mobility status PT Visit Diagnosis: Unsteadiness on feet (R26.81);Other abnormalities of gait and mobility (R26.89);Muscle weakness (generalized) (M62.81);Difficulty in walking, not elsewhere classified (R26.2);Pain Pain - Right/Left: Right Pain - part of body: Knee     Time: 9381-0175 PT Time Calculation (min) (ACUTE ONLY): 29 min  Charges:  $Therapeutic Exercise: 23-37 mins                     Olga Coaster PT, DPT 10:51 AM,05/07/20

## 2020-05-07 NOTE — Plan of Care (Signed)
  Problem: Education: Goal: Knowledge of General Education information will improve Description: Including pain rating scale, medication(s)/side effects and non-pharmacologic comfort measures Outcome: Progressing   Problem: Health Behavior/Discharge Planning: Goal: Ability to manage health-related needs will improve Outcome: Progressing   Problem: Clinical Measurements: Goal: Ability to maintain clinical measurements within normal limits will improve Outcome: Progressing Goal: Will remain free from infection Outcome: Progressing Goal: Respiratory complications will improve Outcome: Progressing Goal: Cardiovascular complication will be avoided Outcome: Progressing   Problem: Activity: Goal: Risk for activity intolerance will decrease Outcome: Progressing   Problem: Nutrition: Goal: Adequate nutrition will be maintained Outcome: Progressing   Problem: Coping: Goal: Level of anxiety will decrease Outcome: Progressing   Problem: Elimination: Goal: Will not experience complications related to bowel motility Outcome: Progressing Goal: Will not experience complications related to urinary retention Outcome: Progressing   Problem: Pain Managment: Goal: General experience of comfort will improve Outcome: Progressing   Problem: Safety: Goal: Ability to remain free from injury will improve Outcome: Progressing   Problem: Skin Integrity: Goal: Risk for impaired skin integrity will decrease Outcome: Progressing   Problem: Education: Goal: Knowledge of the prescribed therapeutic regimen will improve Outcome: Progressing Goal: Individualized Educational Video(s) Outcome: Progressing   Problem: Activity: Goal: Ability to avoid complications of mobility impairment will improve Outcome: Progressing Goal: Range of joint motion will improve Outcome: Progressing   Problem: Clinical Measurements: Goal: Postoperative complications will be avoided or minimized Outcome:  Progressing   Problem: Pain Management: Goal: Pain level will decrease with appropriate interventions Outcome: Progressing   Problem: Skin Integrity: Goal: Will show signs of wound healing Outcome: Progressing

## 2020-05-07 NOTE — Progress Notes (Signed)
  Subjective:  Patient reports pain as mild to moderate.  Resting comfortably.  Objective:   VITALS:   Vitals:   05/06/20 1123 05/06/20 1624 05/06/20 1952 05/06/20 2313  BP: 131/63 128/61 125/68 (!) 101/50  Pulse: 94 95 (!) 109 98  Resp: 16 16 19 18   Temp: 98.1 F (36.7 C) 97.7 F (36.5 C) 98.2 F (36.8 C) 98.1 F (36.7 C)  TempSrc: Oral Oral Oral Oral  SpO2: 99% 98% 100% 98%  Weight:      Height:        PHYSICAL EXAM:  Neurologically intact ABD soft Neurovascular intact Sensation intact distally Intact pulses distally Dorsiflexion/Plantar flexion intact Incision: dressing C/D/I No cellulitis present Compartment soft dressing intact  LABS  Results for orders placed or performed during the hospital encounter of 05/05/20 (from the past 24 hour(s))  CBC     Status: Abnormal   Collection Time: 05/07/20  5:14 AM  Result Value Ref Range   WBC 11.1 (H) 4.0 - 10.5 K/uL   RBC 3.50 (L) 3.87 - 5.11 MIL/uL   Hemoglobin 9.6 (L) 12.0 - 15.0 g/dL   HCT 05/09/20 (L) 36 - 46 %   MCV 83.7 80.0 - 100.0 fL   MCH 27.4 26.0 - 34.0 pg   MCHC 32.8 30.0 - 36.0 g/dL   RDW 07.3 71.0 - 62.6 %   Platelets 276 150 - 400 K/uL   nRBC 0.0 0.0 - 0.2 %    DG Knee Right Port  Result Date: 05/05/2020 CLINICAL DATA:  Postop EXAM: PORTABLE RIGHT KNEE - 1-2 VIEW COMPARISON:  01/12/2017, bone scan 03/12/2020 FINDINGS: Cutaneous staples and small amount of soft tissue gas. Revision of right knee replacement with intact hardware and normal alignment. IMPRESSION: Right knee replacement with expected postsurgical change. Electronically Signed   By: 03/14/2020 M.D.   On: 05/05/2020 17:39   05/07/2020 OR NERVE BLOCK-IMAGE ONLY [facility])  Result Date: 05/05/2020 There is no interpretation for this exam.  This order is for images obtained during a surgical procedure.  Please See "Surgeries" Tab for more information regarding the procedure.    Assessment/Plan: 2 Days Post-Op   Active Problems:   S/P revision of  total knee, right   Advance diet Up with therapy Discharge to SNF when bed approved  WBAT in RLE Xarelto for DVT prophylaxis Hydrocodne for pain H/H stable, non symptomatic  Follow up with Dr. 05/07/2020 office in 2 weeks for staple removal.  Call to confirm appointment  Odis Luster , PA-C 05/07/2020, 8:08 AM

## 2020-05-07 NOTE — TOC Transition Note (Signed)
Transition of Care [facility]) - CM/SW Discharge Note   Patient Details  Name: Kristine Rangel MRN: [id number] Date of Birth: February 28, 1956  Transition of Care [facility]) CM/SW Contact:  Lucy Chris, LCSW Phone Number: 05/07/2020, 11:00 AM   Clinical Narrative:  Pt has insurance approval to go to [facility] today. Ref [id number] 6-25-6-29. MD is ready to DC here. DC packet placed in chart and bedside RN to call report to [phone]. Pt wanted to stay another day but medically to transfer today. Transport set up for 1:30 bedside RN aware    Final next level of care: Skilled Nursing Facility Barriers to Discharge: Barriers Resolved   Patient Goals and CMS Choice Patient states their goals for this hospitalization and ongoing recovery are:: I plan on going to [facility] I will need the rehab when I leave here CMS Medicare.gov Compare Post Acute Care list provided to:: Patient Choice offered to / list presented to : Patient  Discharge Placement PASRR number recieved: 05/06/20            Patient chooses bed at: [facility] [place] Patient to be transferred to facility by: [facility] Name of family member notified: Tina-friend Patient and family notified of of transfer: 05/07/20  Discharge Plan and Services In-house Referral: Clinical Social Work   Post Acute Care Choice: Skilled Nursing Facility                               Social Determinants of Health (SDOH) Interventions     Readmission Risk Interventions No flowsheet data found.

## 2020-05-07 NOTE — Plan of Care (Signed)
Problem: Education: Goal: Knowledge of General Education information will improve Description: Including pain rating scale, medication(s)/side effects and non-pharmacologic comfort measures 05/07/2020 1106 by Meyer Cory, RN Outcome: Adequate for Discharge 05/07/2020 1106 by Meyer Cory, RN Outcome: Progressing   Problem: Health Behavior/Discharge Planning: Goal: Ability to manage health-related needs will improve 05/07/2020 1106 by Meyer Cory, RN Outcome: Adequate for Discharge 05/07/2020 1106 by Meyer Cory, RN Outcome: Progressing   Problem: Clinical Measurements: Goal: Ability to maintain clinical measurements within normal limits will improve 05/07/2020 1106 by Meyer Cory, RN Outcome: Adequate for Discharge 05/07/2020 1106 by Meyer Cory, RN Outcome: Progressing Goal: Will remain free from infection 05/07/2020 1106 by Meyer Cory, RN Outcome: Adequate for Discharge 05/07/2020 1106 by Meyer Cory, RN Outcome: Progressing Goal: Respiratory complications will improve 05/07/2020 1106 by Meyer Cory, RN Outcome: Adequate for Discharge 05/07/2020 1106 by Meyer Cory, RN Outcome: Progressing Goal: Cardiovascular complication will be avoided 05/07/2020 1106 by Meyer Cory, RN Outcome: Adequate for Discharge 05/07/2020 1106 by Meyer Cory, RN Outcome: Progressing   Problem: Activity: Goal: Risk for activity intolerance will decrease 05/07/2020 1106 by Meyer Cory, RN Outcome: Adequate for Discharge 05/07/2020 1106 by Meyer Cory, RN Outcome: Progressing   Problem: Nutrition: Goal: Adequate nutrition will be maintained 05/07/2020 1106 by Meyer Cory, RN Outcome: Adequate for Discharge 05/07/2020 1106 by Meyer Cory, RN Outcome: Progressing   Problem: Coping: Goal: Level of anxiety will decrease 05/07/2020 1106 by Meyer Cory, RN Outcome: Adequate for Discharge 05/07/2020 1106 by Meyer Cory, RN Outcome: Progressing    Problem: Elimination: Goal: Will not experience complications related to bowel motility 05/07/2020 1106 by Meyer Cory, RN Outcome: Adequate for Discharge 05/07/2020 1106 by Meyer Cory, RN Outcome: Progressing Goal: Will not experience complications related to urinary retention 05/07/2020 1106 by Meyer Cory, RN Outcome: Adequate for Discharge 05/07/2020 1106 by Meyer Cory, RN Outcome: Progressing   Problem: Pain Managment: Goal: General experience of comfort will improve 05/07/2020 1106 by Meyer Cory, RN Outcome: Adequate for Discharge 05/07/2020 1106 by Meyer Cory, RN Outcome: Progressing   Problem: Safety: Goal: Ability to remain free from injury will improve 05/07/2020 1106 by Meyer Cory, RN Outcome: Adequate for Discharge 05/07/2020 1106 by Meyer Cory, RN Outcome: Progressing   Problem: Skin Integrity: Goal: Risk for impaired skin integrity will decrease 05/07/2020 1106 by Meyer Cory, RN Outcome: Adequate for Discharge 05/07/2020 1106 by Meyer Cory, RN Outcome: Progressing   Problem: Education: Goal: Knowledge of the prescribed therapeutic regimen will improve 05/07/2020 1106 by Meyer Cory, RN Outcome: Adequate for Discharge 05/07/2020 1106 by Meyer Cory, RN Outcome: Progressing Goal: Individualized Educational Video(s) 05/07/2020 1106 by Meyer Cory, RN Outcome: Adequate for Discharge 05/07/2020 1106 by Meyer Cory, RN Outcome: Progressing   Problem: Activity: Goal: Ability to avoid complications of mobility impairment will improve 05/07/2020 1106 by Meyer Cory, RN Outcome: Adequate for Discharge 05/07/2020 1106 by Meyer Cory, RN Outcome: Progressing Goal: Range of joint motion will improve 05/07/2020 1106 by Meyer Cory, RN Outcome: Adequate for Discharge 05/07/2020 1106 by Meyer Cory, RN Outcome: Progressing   Problem: Clinical Measurements: Goal: Postoperative complications will be avoided or  minimized 05/07/2020 1106 by Meyer Cory, RN Outcome: Adequate for Discharge 05/07/2020 1106 by Meyer Cory, RN Outcome: Progressing   Problem: Pain Management: Goal: Pain level  will decrease with appropriate interventions 05/07/2020 1106 by Ferrel Logan, RN Outcome: Adequate for Discharge 05/07/2020 1106 by Ferrel Logan, RN Outcome: Progressing   Problem: Skin Integrity: Goal: Will show signs of wound healing 05/07/2020 1106 by Ferrel Logan, RN Outcome: Adequate for Discharge 05/07/2020 1106 by Ferrel Logan, RN Outcome: Progressing

## 2020-05-07 NOTE — Discharge Summary (Signed)
Physician Discharge Summary  Patient ID: Kristine Rangel MRN: [id number] DOB/AGE: 1956-10-08 64 y.o.  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 Anxiety   . Arthritis   . Asthma   . Bladder leak   . Cerebral aneurysm    history-has coils  . CHF (congestive heart failure) (HCC)   . Chronic kidney disease   . COPD (chronic obstructive pulmonary disease) (HCC)   . Depression   . Diabetes mellitus without complication (HCC)   . Dyspnea   . GERD (gastroesophageal reflux disease)   . Glaucoma   . Headache   . History of kidney stones   . Hyperlipidemia   . Hypertension   . MI (myocardial infarction) (HCC)    unsure when  . Neuropathy   . Rotator cuff injury   . Seasonal allergies   . Stroke [facility])    TIA's    Surgeries: Procedure(s): right total knee arthroplasty revision on 05/05/2020   Consultants (if any):   Discharged Condition: Improved  Hospital Course: Kristine Rangel is an 64 y.o. 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    She was given perioperative antibiotics:  Anti-infectives (From admission, onward)   Start     Dose/Rate Route Frequency Ordered Stop   05/05/20 1700  clindamycin (CLEOCIN) IVPB 600 mg        600 mg 100 mL/hr over 30 Minutes Intravenous Every 6 hours 05/05/20 1659 05/06/20 0536    05/05/20 1700  clindamycin (CLEOCIN) 900 MG/50ML IVPB  Status:  Discontinued       Note to Pharmacy: Apple, Blenda Mounts   : cabinet override      05/05/20 1700 05/05/20 1712   05/05/20 1145  50,000 units bacitracin in 0.9% normal saline 250 mL irrigation  Status:  Discontinued          As needed 05/05/20 1145 05/05/20 1501   05/05/20 0909  clindamycin (CLEOCIN) 900 MG/50ML IVPB       Note to Pharmacy: Register, Karen   : cabinet override      05/05/20 0909 05/05/20 1042   05/05/20 0845  clindamycin (CLEOCIN) IVPB 900 mg        900 mg 100 mL/hr over 30 Minutes Intravenous On call to O.R. 05/05/20 8841 05/05/20 1041    .  She was given sequential compression devices, early ambulation, and Xarelto for DVT prophylaxis.  She benefited maximally from the hospital stay and there were no complications.    Recent vital signs:  Vitals:   05/06/20 2313 05/07/20 0822  BP: (!) 101/50 134/60  Pulse: 98 93  Resp: 18 18  Temp: 98.1 F (36.7 C) 98.3 F (36.8 C)  SpO2: 98% 99%    Recent laboratory studies:  Lab Results  Component Value Date   HGB 9.6 (L) 05/07/2020   HGB 10.5 (L) 05/06/2020   HGB 12.1 04/27/2020   Lab Results  Component Value  Date   WBC 11.1 (H) 05/07/2020   PLT 276 05/07/2020   Lab Results  Component Value Date   INR 1.0 04/27/2020   Lab Results  Component Value Date   NA 137 05/06/2020   K 4.1 05/06/2020   CL 105 05/06/2020   CO2 25 05/06/2020   BUN 26 (H) 05/06/2020   CREATININE 1.49 (H) 05/06/2020   GLUCOSE 126 (H) 05/06/2020    Discharge Medications:   Allergies as of 05/07/2020      Reactions   Bee Venom Anaphylaxis   Penicillins Hives, Swelling, Other (See Comments)   Has patient had a PCN reaction causing immediate rash, facial/tongue/throat swelling, SOB or lightheadedness with hypotension: Yes Has patient had a PCN reaction causing severe rash involving mucus membranes or skin necrosis: No Has patient had a PCN reaction that required hospitalization  No Has patient had a PCN reaction occurring within the last 10 years: No If all of the above answers are "NO", then may proceed with Cephalosporin use.      Medication List    STOP taking these medications   meloxicam 7.5 MG tablet Commonly known as: MOBIC   traMADol 50 MG tablet Commonly known as: ULTRAM     TAKE these medications   atorvastatin 40 MG tablet Commonly known as: LIPITOR Take 40 mg by mouth at bedtime.   cetirizine 10 MG tablet Commonly known as: ZYRTEC Take 10 mg by mouth daily.   Combigan 0.2-0.5 % ophthalmic solution Generic drug: brimonidine-timolol Place 1 drop into both eyes 2 (two) times daily.   docusate sodium 100 MG capsule Commonly known as: COLACE Take 1 capsule (100 mg total) by mouth 2 (two) times daily.   DULoxetine 60 MG capsule Commonly known as: CYMBALTA Take 60 mg by mouth at bedtime.   EPINEPHrine 0.3 mg/0.3 mL Soaj injection Commonly known as: EPI-PEN Inject 0.3 mg into the skin as needed for anaphylaxis.   esomeprazole 40 MG capsule Commonly known as: NEXIUM Take 40 mg by mouth daily.   fluticasone 50 MCG/ACT nasal spray Commonly known as: FLONASE Place 1 spray into both nostrils daily.   furosemide 40 MG tablet Commonly known as: LASIX Take 40 mg by mouth 2 (two) times daily.   gabapentin 300 MG capsule Commonly known as: NEURONTIN Take 300 mg by mouth 3 (three) times daily.   Gemtesa 75 MG Tabs Generic drug: Vibegron Take 1 tablet by mouth daily.   HYDROcodone-acetaminophen 5-325 MG tablet Commonly known as: NORCO/VICODIN Take 1 tablet by mouth every 4 (four) hours as needed for moderate pain (pain score 4-6).   latanoprost 0.005 % ophthalmic solution Commonly known as: XALATAN Place 1 drop into both eyes at bedtime.   ondansetron 4 MG tablet Commonly known as: ZOFRAN Take 1 tablet (4 mg total) by mouth every 6 (six) hours as needed for nausea.   oxybutynin 10 MG 24 hr tablet Commonly known as:  DITROPAN-XL Take 10 mg by mouth at bedtime.   potassium chloride 10 MEQ tablet Commonly known as: KLOR-CON Take 10 mEq by mouth daily.   rivaroxaban 10 MG Tabs tablet Commonly known as: XARELTO Take 1 tablet (10 mg total) by mouth daily with breakfast.   Symbicort 160-4.5 MCG/ACT inhaler Generic drug: budesonide-formoterol Inhale 2 puffs into the lungs 2 (two) times daily.   tiZANidine 4 MG tablet Commonly known as: ZANAFLEX Take 2-4 mg by mouth every 6 (six) hours as needed for muscle spasms.       Diagnostic Studies: DG  Knee Right Port  Result Date: 05/05/2020 CLINICAL DATA:  Postop EXAM: PORTABLE RIGHT KNEE - 1-2 VIEW COMPARISON:  01/12/2017, bone scan 03/12/2020 FINDINGS: Cutaneous staples and small amount of soft tissue gas. Revision of right knee replacement with intact hardware and normal alignment. IMPRESSION: Right knee replacement with expected postsurgical change. Electronically Signed   By: Jasmine Pang M.D.   On: 05/05/2020 17:39   [place] OR NERVE BLOCK-IMAGE ONLY [facility])  Result Date: 05/05/2020 There is no interpretation for this exam.  This order is for images obtained during a surgical procedure.  Please See "Surgeries" Tab for more information regarding the procedure.    Disposition:      Contact information for after-discharge care    Destination    [facility] Preferred SNF .   Service: Skilled Nursing Contact information: [address] [place] [address] [phone]                   Signed: Altamese Cabal ,PA-C 05/07/2020, 8:37 AM

## 2020-05-07 NOTE — Discharge Instructions (Signed)
Continue weight bear as tolerated on the right lower extremity.    Elevate the right lower extremity whenever possible and continue the polar care while elevating the extremity. Patient may shower. No bath or submerging the wound.    Take Xarelto as directed for blood clot prevention.  Continue to work on knee range of motion exercises at home as instructed by physical therapy. Continue to use a walker for assistance with ambulation until cleared by physical therapy.  Call [phone] with any questions, such as fever > 101.5 degrees, drainage from the wound or shortness of breath.  

## 2020-05-07 NOTE — TOC Progression Note (Signed)
Transition of Care [facility]) - Progression Note    Patient Details  Name: Kristine Rangel MRN: [id number] Date of Birth: 04-05-1956  Transition of Care [facility]) CM/SW Contact  Brihana Quickel, Lemar Livings, LCSW Phone Number: 05/07/2020, 10:50 AM  Clinical Narrative:   Pt's insurance has approved for transfer to [facility] today. Ref number [id number]. Approved through 6/25-6/29. DC paperwork completed and order written. Will work on transport and Optician, dispensing regarding this    Expected Discharge Plan: Skilled Nursing Facility Barriers to Discharge: Continued Medical Work up  Expected Discharge Plan and Services Expected Discharge Plan: Skilled Nursing Facility In-house Referral: Clinical Social Work   Post Acute Care Choice: Skilled Nursing Facility Living arrangements for the past 2 months: Apartment Expected Discharge Date: 05/07/20                                     Social Determinants of Health (SDOH) Interventions    Readmission Risk Interventions No flowsheet data found.

## 2020-05-07 NOTE — Progress Notes (Signed)
PT Cancellation Note  Patient Details Name: Kristine Rangel MRN: [id number] DOB: 1956-01-06   Cancelled Treatment:    Reason Eval/Treat Not Completed: Other (comment) (Pt requested PT to come back later in the AM, wants to eat breakfast, just recently recieved pain medication.)   Olga Coaster PT, DPT 8:47 AM,05/07/20

## 2020-05-22 NOTE — H&P (Signed)
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 Symptoms are rated as moderate to severe, and have been worsening.  This is significantly impairing activities of daily living.  She has elected for surgical management.   Past Medical History:  Diagnosis Date  . Anxiety   . Arthritis   . Asthma   . Bladder leak   . Cerebral aneurysm    history-has coils  . CHF (congestive heart failure) (HCC)   . Chronic kidney disease   . COPD (chronic obstructive pulmonary disease) (HCC)   . Depression   . Diabetes mellitus without complication (HCC)   . Dyspnea   . GERD (gastroesophageal reflux disease)   . Glaucoma   . Headache   . History of kidney stones   . Hyperlipidemia   . Hypertension   . MI (myocardial infarction) (HCC)    unsure when  . Neuropathy   . Rotator cuff injury   . Seasonal allergies   . Stroke [facility])    TIA's   Past Surgical History:  Procedure Laterality Date  . BRAIN SURGERY Right 2010   anuerysm repair  . CARPAL TUNNEL RELEASE Right 06/12/2017   Procedure: CARPAL TUNNEL RELEASE;  Surgeon: Juanell Fairly, MD;  Location: [facility] ORS;  Service: Orthopedics;  Laterality: Right;  . CHOLECYSTECTOMY    . JOINT REPLACEMENT Bilateral    TOTAL KNEE REPLACEMENT  . SHOULDER ARTHROSCOPY WITH OPEN ROTATOR CUFF REPAIR Left 11/23/2016   Procedure: SHOULDER ARTHROSCOPY WITH OPEN ROTATOR CUFF REPAIR, ARTHROSCOPIC SUBACROMIAL DECOMPRESSION, DISTAL CLAVICLE EXCISION;  Surgeon: Juanell Fairly, MD;  Location: [facility] ORS;  Service: Orthopedics;  Laterality: Left;  . SHOULDER ARTHROSCOPY WITH OPEN ROTATOR CUFF REPAIR Right 07/30/2018   Procedure: SHOULDER  ARTHROSCOPY WITH OPEN ROTATOR CUFF REPAIR,SUBACROMINAL DECOMPRESSION AND DISTAL CLAVICLE EXCISION;  Surgeon: Juanell Fairly, MD;  Location: [facility] ORS;  Service: Orthopedics;  Laterality: Right;  . TOTAL KNEE REVISION Right 05/05/2020   Procedure: right total knee arthroplasty revision;  Surgeon: Lyndle Herrlich, MD;  Location: [facility] ORS;  Service: Orthopedics;  Laterality: Right;   Social History   Socioeconomic History  . Marital status: Single    Spouse name: Not on file  . Number of children: Not on file  . Years of education: Not on file  . Highest education level: Not on file  Occupational History  . Not on file  Tobacco Use  . Smoking status: Former Smoker    Quit date: 11/17/1987    Years since quitting: 32.5  . Smokeless tobacco: Current User    Types: Snuff  Vaping Use  . Vaping Use: Never used  Substance and Sexual Activity  . Alcohol use: No  . Drug use: No  . Sexual activity: Not Currently    Birth control/protection: Post-menopausal  Other Topics Concern  . Not on file  Social History Narrative  . Not on file   Social Determinants of Health   Financial Resource Strain:   . Difficulty of Paying Living Expenses:   Food Insecurity:   . Worried About Programme researcher, broadcasting/film/video in the Last Year:   . Barista in the Last Year:   Transportation Needs:   . Freight forwarder (Medical):   Marland Kitchen  Lack of Transportation (Non-Medical):   Physical Activity:   . Days of Exercise per Week:   . Minutes of Exercise per Session:   Stress:   . Feeling of Stress :   Social Connections:   . Frequency of Communication with Friends and Family:   . Frequency of Social Gatherings with Friends and Family:   . Attends Religious Services:   . Active Member of Clubs or Organizations:   . Attends Banker Meetings:   Marland Kitchen Marital Status:    Family History  Problem Relation Age of Onset  . Breast cancer Maternal Aunt    Allergies  Allergen Reactions  . Bee Venom  Anaphylaxis  . Penicillins Hives, Swelling and Other (See Comments)    Has patient had a PCN reaction causing immediate rash, facial/tongue/throat swelling, SOB or lightheadedness with hypotension: Yes Has patient had a PCN reaction causing severe rash involving mucus membranes or skin necrosis: No Has patient had a PCN reaction that required hospitalization No Has patient had a PCN reaction occurring within the last 10 years: No If all of the above answers are "NO", then may proceed with Cephalosporin use.   Prior to Admission medications   Medication Sig Start Date End Date Taking? Authorizing Provider  atorvastatin (LIPITOR) 40 MG tablet Take 40 mg by mouth at bedtime. 03/13/16  Yes [provider]  brimonidine-timolol (COMBIGAN) 0.2-0.5 % ophthalmic solution Place 1 drop into both eyes 2 (two) times daily.   Yes [provider]  cetirizine (ZYRTEC) 10 MG tablet Take 10 mg by mouth daily.   Yes [provider]  DULoxetine (CYMBALTA) 60 MG capsule Take 60 mg by mouth at bedtime.  03/13/16  Yes [provider]  esomeprazole (NEXIUM) 40 MG capsule Take 40 mg by mouth daily. 03/17/16  Yes [provider]  fluticasone (FLONASE) 50 MCG/ACT nasal spray Place 1 spray into both nostrils daily.    Yes [provider]  furosemide (LASIX) 40 MG tablet Take 40 mg by mouth 2 (two) times daily.    Yes [provider]  gabapentin (NEURONTIN) 300 MG capsule Take 300 mg by mouth 3 (three) times daily.    Yes [provider]  latanoprost (XALATAN) 0.005 % ophthalmic solution Place 1 drop into both eyes at bedtime. 03/19/20  Yes [provider]  oxybutynin (DITROPAN-XL) 10 MG 24 hr tablet Take 10 mg by mouth at bedtime.  03/11/20  Yes [provider]  potassium chloride (K-DUR) 10 MEQ tablet Take 10 mEq by mouth daily. 03/13/16  Yes [provider]  SYMBICORT 160-4.5 MCG/ACT inhaler Inhale 2 puffs into the lungs 2 (two) times  daily. 03/13/16  Yes [provider]  tiZANidine (ZANAFLEX) 4 MG tablet Take 2-4 mg by mouth every 6 (six) hours as needed for muscle spasms.  09/26/16  Yes [provider]  docusate sodium (COLACE) 100 MG capsule Take 1 capsule (100 mg total) by mouth 2 (two) times daily. 05/07/20   Altamese Cabal, PA-C  EPINEPHrine 0.3 mg/0.3 mL IJ SOAJ injection Inject 0.3 mg into the skin as needed for anaphylaxis.     [provider]  HYDROcodone-acetaminophen (NORCO/VICODIN) 5-325 MG tablet Take 1 tablet by mouth every 4 (four) hours as needed for moderate pain (pain score 4-6). 05/07/20   Altamese Cabal, PA-C  ondansetron (ZOFRAN) 4 MG tablet Take 1 tablet (4 mg total) by mouth every 6 (six) hours as needed for nausea. 05/07/20   Altamese Cabal, PA-C  rivaroxaban Carlena Hurl)  10 MG TABS tablet Take 1 tablet (10 mg total) by mouth daily with breakfast. 05/07/20   Altamese Cabal, PA-C  Vibegron (GEMTESA) 75 MG TABS Take 1 tablet by mouth daily. Patient not taking: Reported on 04/15/2020 03/16/20   Michiel Cowboy A, PA-C     Positive ROS: All other systems have been reviewed and were otherwise negative with the exception of those mentioned in the HPI and as above.  Physical Exam: General: Alert, no acute distress Cardiovascular: Regular rate and rhythm, no murmurs rubs or gallops.  No pedal edema Respiratory: Clear to auscultation bilaterally, no wheezes rales or rhonchi. No cyanosis, no use of accessory musculature GI: No organomegaly, abdomen is soft and non-tender nondistended with positive bowel sounds. Skin: Skin intact, no lesions within the operative field. 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 The risks include but are not limited to infection, bleeding requiring blood transfusion, nerve or blood vessel injury, joint stiffness or loss of motion, persistent pain, weakness or instability, malunion, nonunion and hardware failure and the need for further surgery. Medical risks include but are not limited to DVT and pulmonary embolism, myocardial infarction, stroke, pneumonia, respiratory failure and death. Patient understood these risks and wished to proceed.   Lyndle Herrlich, MD   05/22/2020 3:12 PM

## 2020-06-23 ENCOUNTER — Other Ambulatory Visit: Payer: Self-pay | Admitting: Family Medicine

## 2020-06-23 DIAGNOSIS — Z1231 Encounter for screening mammogram for malignant neoplasm of breast: Secondary | ICD-10-CM

## 2020-07-30 ENCOUNTER — Ambulatory Visit
Admission: RE | Admit: 2020-07-30 | Discharge: 2020-07-30 | Disposition: A | Discharge status: Home / Self Care | Payer: Medicare HMO | Source: Ambulatory Visit | Attending: Family Medicine | Admitting: Family Medicine

## 2020-07-30 DIAGNOSIS — Z1231 Encounter for screening mammogram for malignant neoplasm of breast: Secondary | ICD-10-CM | POA: Diagnosis not present

## 2020-11-25 ENCOUNTER — Other Ambulatory Visit: Payer: Self-pay | Admitting: Family Medicine

## 2020-11-25 DIAGNOSIS — Z1231 Encounter for screening mammogram for malignant neoplasm of breast: Secondary | ICD-10-CM

## 2020-11-25 DIAGNOSIS — Z13820 Encounter for screening for osteoporosis: Secondary | ICD-10-CM

## 2021-01-28 ENCOUNTER — Emergency Department
Admission: EM | Admit: 2021-01-28 | Discharge: 2021-01-28 | Disposition: A | Discharge status: Home / Self Care | Payer: Medicare HMO | Attending: Emergency Medicine | Admitting: Emergency Medicine

## 2021-01-28 ENCOUNTER — Emergency Department: Payer: Medicare HMO

## 2021-01-28 ENCOUNTER — Other Ambulatory Visit: Payer: Self-pay

## 2021-01-28 ENCOUNTER — Encounter: Payer: Self-pay | Admitting: Radiology

## 2021-01-28 DIAGNOSIS — K2100 Gastro-esophageal reflux disease with esophagitis, without bleeding: Secondary | ICD-10-CM | POA: Diagnosis not present

## 2021-01-28 DIAGNOSIS — E1122 Type 2 diabetes mellitus with diabetic chronic kidney disease: Secondary | ICD-10-CM | POA: Diagnosis not present

## 2021-01-28 DIAGNOSIS — M79661 Pain in right lower leg: Secondary | ICD-10-CM | POA: Insufficient documentation

## 2021-01-28 DIAGNOSIS — I130 Hypertensive heart and chronic kidney disease with heart failure and stage 1 through stage 4 chronic kidney disease, or unspecified chronic kidney disease: Secondary | ICD-10-CM | POA: Insufficient documentation

## 2021-01-28 DIAGNOSIS — F17220 Nicotine dependence, chewing tobacco, uncomplicated: Secondary | ICD-10-CM | POA: Diagnosis not present

## 2021-01-28 DIAGNOSIS — Z96653 Presence of artificial knee joint, bilateral: Secondary | ICD-10-CM | POA: Diagnosis not present

## 2021-01-28 DIAGNOSIS — Z7901 Long term (current) use of anticoagulants: Secondary | ICD-10-CM | POA: Diagnosis not present

## 2021-01-28 DIAGNOSIS — J45909 Unspecified asthma, uncomplicated: Secondary | ICD-10-CM | POA: Diagnosis not present

## 2021-01-28 DIAGNOSIS — N189 Chronic kidney disease, unspecified: Secondary | ICD-10-CM | POA: Insufficient documentation

## 2021-01-28 DIAGNOSIS — Z8673 Personal history of transient ischemic attack (TIA), and cerebral infarction without residual deficits: Secondary | ICD-10-CM | POA: Insufficient documentation

## 2021-01-28 DIAGNOSIS — I509 Heart failure, unspecified: Secondary | ICD-10-CM | POA: Insufficient documentation

## 2021-01-28 DIAGNOSIS — R079 Chest pain, unspecified: Secondary | ICD-10-CM | POA: Diagnosis present

## 2021-01-28 DIAGNOSIS — J449 Chronic obstructive pulmonary disease, unspecified: Secondary | ICD-10-CM | POA: Insufficient documentation

## 2021-01-28 DIAGNOSIS — Z7951 Long term (current) use of inhaled steroids: Secondary | ICD-10-CM | POA: Insufficient documentation

## 2021-01-28 LAB — BASIC METABOLIC PANEL
Anion gap: 9 (ref 5–15)
BUN: 10 mg/dL (ref 8–23)
CO2: 26 mmol/L (ref 22–32)
Calcium: 10 mg/dL (ref 8.9–10.3)
Chloride: 102 mmol/L (ref 98–111)
Creatinine, Ser: 1.04 mg/dL — ABNORMAL HIGH (ref 0.44–1.00)
GFR, Estimated: 59 mL/min — ABNORMAL LOW (ref >60)
Glucose, Bld: 96 mg/dL (ref 70–99)
Potassium: 3.2 mmol/L — ABNORMAL LOW (ref 3.5–5.1)
Sodium: 137 mmol/L (ref 135–145)

## 2021-01-28 LAB — TROPONIN I (HIGH SENSITIVITY)
Troponin I (High Sensitivity): 5 ng/L (ref <18)
Troponin I (High Sensitivity): 7 ng/L (ref <18)

## 2021-01-28 LAB — HEPATIC FUNCTION PANEL
ALT: 21 U/L (ref 0–44)
AST: 30 U/L (ref 15–41)
Albumin: 3.9 g/dL (ref 3.5–5.0)
Alkaline Phosphatase: 105 U/L (ref 38–126)
Bilirubin, Direct: 0.1 mg/dL (ref 0.0–0.2)
Indirect Bilirubin: 0.5 mg/dL (ref 0.3–0.9)
Total Bilirubin: 0.6 mg/dL (ref 0.3–1.2)
Total Protein: 7.5 g/dL (ref 6.5–8.1)

## 2021-01-28 LAB — CBC
HCT: 38.9 % (ref 36.0–46.0)
Hemoglobin: 12.3 g/dL (ref 12.0–15.0)
MCH: 26.7 pg (ref 26.0–34.0)
MCHC: 31.6 g/dL (ref 30.0–36.0)
MCV: 84.6 fL (ref 80.0–100.0)
Platelets: 449 10*3/uL — ABNORMAL HIGH (ref 150–400)
RBC: 4.6 MIL/uL (ref 3.87–5.11)
RDW: 14.9 % (ref 11.5–15.5)
WBC: 4.8 10*3/uL (ref 4.0–10.5)
nRBC: 0 % (ref 0.0–0.2)

## 2021-01-28 LAB — LIPASE, BLOOD: Lipase: 24 U/L (ref 11–51)

## 2021-01-28 MED ORDER — MORPHINE SULFATE (PF) 4 MG/ML IV SOLN
4.0000 mg | Freq: Once | INTRAVENOUS | Status: AC
  Administered 2021-01-28: 4 mg via INTRAVENOUS
  Filled 2021-01-28: qty 1

## 2021-01-28 MED ORDER — IOHEXOL 350 MG/ML SOLN
100.0000 mL | Freq: Once | INTRAVENOUS | Status: AC | PRN
  Administered 2021-01-28: 100 mL via INTRAVENOUS

## 2021-01-28 NOTE — ED Provider Notes (Signed)
[facility] Emergency Department Provider Note   ____________________________________________   Event Date/Time   First MD Initiated Contact with Patient 01/28/21 1316     (approximate)  I have reviewed the triage vital signs and the nursing notes.   HISTORY  Chief Complaint Leg pain   HPI Kristine Rangel is a 65 y.o. female with possible history of hypertension, hyperlipidemia, diabetes, COPD, and CHF who presents to the ED complaining of leg and chest pain.  Patient reports that for the past 2 weeks she has been dealing with increasing pain and swelling in her right lower extremity.  She had knee replacement surgery in June of last year, states she had been doing well up until a couple of weeks ago.  She has noticed increased swelling to her right lower leg along with pain, especially at her right calf.  She followed up with her orthopedic surgeon today, who referred her to the ED for further evaluation.  Patient additionally states she has been dealing with pain in her chest for the past 2 days.  She describes this as a constant sharp pain that starts in the center of her chest and can move downwards into her epigastrium.  She has not had any associated fevers, cough, shortness of breath, nausea, vomiting, or changes in bowel movements.  She denies a history of similar symptoms, has never had a clot in her legs or lungs and is not currently anticoagulated.  She does state the pain in her chest felt like gas, but she has not had any relief with over-the-counter medications.        Past Medical History:  Diagnosis Date  . Anxiety   . Arthritis   . Asthma   . Bladder leak   . Cerebral aneurysm    history-has coils  . CHF (congestive heart failure) (HCC)   . Chronic kidney disease   . COPD (chronic obstructive pulmonary disease) (HCC)   . Depression   . Diabetes mellitus without complication (HCC)   . Dyspnea   . GERD (gastroesophageal reflux disease)    . Glaucoma   . Headache   . History of kidney stones   . Hyperlipidemia   . Hypertension   . MI (myocardial infarction) (HCC)    unsure when  . Neuropathy   . Rotator cuff injury   . Seasonal allergies   . Stroke [facility])    TIA's    Patient Active Problem List   Diagnosis Date Noted  . 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 S/P right rotator cuff repair 07/30/2018  . S/P left rotator cuff repair 11/23/2016  . Benign essential hypertension 02/05/2003    Past Surgical History:  Procedure Laterality Date  . BRAIN SURGERY Right 2010   anuerysm repair  . CARPAL TUNNEL RELEASE Right 06/12/2017   Procedure: CARPAL TUNNEL RELEASE;  Surgeon: Juanell Fairly, MD;  Location: [facility] ORS;  Service: Orthopedics;  Laterality: Right;  . CHOLECYSTECTOMY    . JOINT REPLACEMENT Bilateral    TOTAL KNEE REPLACEMENT  . SHOULDER ARTHROSCOPY WITH OPEN ROTATOR CUFF REPAIR Left 11/23/2016   Procedure: SHOULDER ARTHROSCOPY WITH OPEN ROTATOR CUFF REPAIR, ARTHROSCOPIC SUBACROMIAL DECOMPRESSION, DISTAL CLAVICLE EXCISION;  Surgeon: Juanell Fairly, MD;  Location: [facility] ORS;  Service: Orthopedics;  Laterality: Left;  . SHOULDER ARTHROSCOPY WITH OPEN ROTATOR CUFF REPAIR Right 07/30/2018   Procedure: SHOULDER ARTHROSCOPY WITH OPEN ROTATOR CUFF REPAIR,SUBACROMINAL DECOMPRESSION AND DISTAL CLAVICLE EXCISION;  Surgeon: Juanell Fairly, MD;  Location: [facility] ORS;  Service: Orthopedics;  Laterality: Right;  . TOTAL KNEE REVISION Right 05/05/2020   Procedure: right total knee arthroplasty revision;  Surgeon: Lyndle Herrlich, MD;  Location: [facility] ORS;  Service: Orthopedics;  Laterality: Right;    Prior to Admission medications   Medication Sig Start Date End Date Taking? Authorizing Provider  atorvastatin (LIPITOR) 40 MG tablet Take 40 mg by mouth at bedtime. 03/13/16   [provider]  brimonidine-timolol (COMBIGAN) 0.2-0.5 % ophthalmic solution Place 1 drop into both eyes 2 (two) times daily.    [provider]  cetirizine (ZYRTEC) 10 MG tablet Take 10 mg by mouth daily.    [provider]  docusate sodium (COLACE) 100 MG capsule Take 1 capsule (100 mg total) by mouth 2 (two) times daily. 05/07/20   Altamese Cabal, PA-C  DULoxetine (CYMBALTA) 60 MG capsule Take 60 mg by mouth at bedtime.  03/13/16   [provider]  EPINEPHrine 0.3 mg/0.3 mL IJ SOAJ injection Inject 0.3 mg into the skin as needed for anaphylaxis.     [provider]  esomeprazole (NEXIUM) 40 MG capsule Take 40 mg by mouth daily. 03/17/16   [provider]  fluticasone (FLONASE) 50 MCG/ACT nasal spray Place 1 spray into both nostrils daily.     [provider]  furosemide (LASIX) 40 MG tablet Take 40 mg by mouth 2 (two) times daily.     [provider]  gabapentin (NEURONTIN) 300 MG capsule Take 300 mg by mouth 3 (three) times daily.     [provider]  HYDROcodone-acetaminophen (NORCO/VICODIN) 5-325 MG tablet Take 1 tablet by mouth every 4 (four) hours as needed for moderate pain (pain score 4-6). 05/07/20   Altamese Cabal, PA-C  latanoprost (XALATAN) 0.005 % ophthalmic solution Place 1 drop into both eyes at bedtime. 03/19/20   [provider]  ondansetron (ZOFRAN) 4 MG tablet Take 1 tablet (4 mg total) by mouth every 6 (six) hours as needed for nausea. 05/07/20   Altamese Cabal, PA-C  oxybutynin (DITROPAN-XL) 10 MG 24 hr tablet Take 10 mg by mouth at bedtime.  03/11/20   [provider]  potassium chloride (K-DUR) 10 MEQ tablet Take 10 mEq by mouth daily. 03/13/16   [provider]  rivaroxaban (XARELTO) 10 MG TABS tablet Take 1 tablet (10 mg total) by mouth daily with breakfast. 05/07/20   Altamese Cabal, PA-C  SYMBICORT 160-4.5 MCG/ACT inhaler Inhale 2 puffs into the lungs 2 (two) times daily. 03/13/16   [provider]  tiZANidine (ZANAFLEX) 4 MG tablet Take 2-4 mg by mouth every 6 (six) hours as needed for muscle spasms.  09/26/16    [provider]  Vibegron (GEMTESA) 75 MG TABS Take 1 tablet by mouth daily. Patient not taking: Reported on 04/15/2020 03/16/20   Michiel Cowboy A, PA-C    Allergies Bee venom and Penicillins  Family History  Problem Relation Age of Onset  . Breast cancer Maternal Aunt     Social History Social History   Tobacco Use  . Smoking status: Former Smoker    Quit date: 11/17/1987    Years since quitting: 33.2  . Smokeless tobacco: Current User    Types: Snuff  Vaping Use  . Vaping Use: Never used  Substance Use Topics  . Alcohol use: No  . Drug use: No    Review of Systems  Constitutional: No fever/chills Eyes: No visual changes. ENT: No sore throat. Cardiovascular: Positive for chest pain. Respiratory: Denies shortness of  breath. Gastrointestinal: No abdominal pain.  No nausea, no vomiting.  No diarrhea.  No constipation. Genitourinary: Negative for dysuria. Musculoskeletal: Negative for back pain.  Positive for right lower extremity pain. Skin: Negative for rash. Neurological: Negative for headaches, focal weakness or numbness.  ____________________________________________   PHYSICAL EXAM:  VITAL SIGNS: ED Triage Vitals  Enc Vitals Group     BP 01/28/21 1137 116/77     Pulse Rate 01/28/21 1137 86     Resp 01/28/21 1137 18     Temp 01/28/21 1137 98.3 F (36.8 C)     Temp Source 01/28/21 1137 Oral     SpO2 01/28/21 1137 100 %     Weight 01/28/21 1136 290 lb (131.5 kg)     Height 01/28/21 1136 5\' 5"  (1.651 m)     Head Circumference --      Peak Flow --      Pain Score 01/28/21 1140 10     Pain Loc --      Pain Edu? --      Excl. in GC? --     Constitutional: Alert and oriented. Eyes: Conjunctivae are normal. Head: Atraumatic. Nose: No congestion/rhinnorhea. Mouth/Throat: Mucous membranes are moist. Neck: Normal ROM Cardiovascular: Normal rate, regular rhythm. Grossly normal heart sounds.  2+ radial and DP pulses bilaterally. Respiratory: Normal  respiratory effort.  No retractions. Lungs CTAB.  No chest wall tenderness to palpation. Gastrointestinal: Soft and nontender. No distention. Genitourinary: deferred Musculoskeletal: 1+ pitting edema to left lower extremity, 2+ edema to right lower extremity with associated calf tenderness, no erythema or warmth noted. Neurologic:  Normal speech and language. No gross focal neurologic deficits are appreciated. Skin:  Skin is warm, dry and intact. No rash noted. Psychiatric: Mood and affect are normal. Speech and behavior are normal.  ____________________________________________   LABS (all labs ordered are listed, but only abnormal results are displayed)  Labs Reviewed  BASIC METABOLIC PANEL - Abnormal; Notable for the following components:      Result Value   Potassium 3.2 (*)    Creatinine, Ser 1.04 (*)    GFR, Estimated 59 (*)    All other components within normal limits  CBC - Abnormal; Notable for the following components:   Platelets 449 (*)    All other components within normal limits  HEPATIC FUNCTION PANEL  LIPASE, BLOOD  TROPONIN I (HIGH SENSITIVITY)  TROPONIN I (HIGH SENSITIVITY)   ____________________________________________  EKG  ED ECG REPORT I, 01/30/21, the attending physician, personally viewed and interpreted this ECG.   Date: 01/28/2021  EKG Time: 11:44  Rate: 88  Rhythm: normal sinus rhythm, occasional PVC  Axis: Normal  Intervals:none  ST&T Change: None   PROCEDURES  Procedure(s) performed (including Critical Care):  Procedures   ____________________________________________   INITIAL IMPRESSION / ASSESSMENT AND PLAN / ED COURSE       65 year old female with possible history of hypertension, hyperlipidemia, diabetes, COPD, and CHF who presents to the ED with 2 weeks of increasing pain and swelling to her right lower extremity along with a couple days of constant sharp pain in the center of her chest.  EKG shows no evidence of  arrhythmia or ischemia, patient reports a "mild heart attack" in the past but per cardiology visits make no mention of this and patient does not appear to have a history of CAD.  Initial troponin is negative and I have a low suspicion for ACS, we will check repeat troponin.  I would have a  higher suspicion for DVT/PE and we will further assess with ultrasound and CTA chest.  She has no abdominal tenderness on exam, we will add on LFTs and lipase but if these are negative her chest pain may be related to GERD.  There is no evidence of infectious process to her right lower extremity and she is neurovascularly intact.  Ultrasound of right lower extremity is negative for DVT, CTA chest is also negative for PE.  2 sets of troponin are negative and I have a low suspicion for ACS.  Her chest pain seems more likely related to GERD as CT scan shows tortuous esophagus.  Patient informed that she will need to have an endoscopy to rule out mass and she was provided with referral to GI.  She was counseled to start taking her PPI twice daily, does state that her chest pain has resolved.  She is appropriate for discharge home with PCP and GI follow-up, was counseled to return to the ED for new or worsening symptoms.  Patient agrees with plan.      ____________________________________________   FINAL CLINICAL IMPRESSION(S) / ED DIAGNOSES  Final diagnoses:  Nonspecific chest pain  Gastroesophageal reflux disease with esophagitis without hemorrhage     ED Discharge Orders    None       Note:  This document was prepared using Dragon voice recognition software and may include unintentional dictation errors.   Chesley NoonJessup, Airelle Everding, MD 01/28/21 1902

## 2021-01-28 NOTE — ED Triage Notes (Signed)
Pt to ED via POV with c/o substernal CP that radiates to lower abdomen and back, pt states pain is sharp shooting, pt also c/o bilateral lower leg swelling at this time, states R leg worse than left leg. Pt states CP has been intermittent since yesterday

## 2021-01-28 NOTE — ED Notes (Signed)
Attempted to place PIV at this time, attempt unsuccessful. Will reattempt.

## 2021-01-28 NOTE — ED Notes (Signed)
Katye RN to re-attempt PIV insertion at this time.

## 2021-01-28 NOTE — ED Notes (Signed)
Pt verbalized understanding of d/c instructions at this time. Pt denied further questions at this time. Pt assisted to lobby via wheelchair to wait for ride at this time, NAD noted, RR even and unlabored.   First RN made aware at this time.

## 2021-02-04 ENCOUNTER — Other Ambulatory Visit: Payer: Self-pay

## 2021-02-04 ENCOUNTER — Ambulatory Visit (INDEPENDENT_AMBULATORY_CARE_PROVIDER_SITE_OTHER): Payer: Medicare HMO | Admitting: Internal Medicine

## 2021-02-04 ENCOUNTER — Encounter: Payer: Self-pay | Admitting: Internal Medicine

## 2021-02-04 VITALS — BP 128/78 | HR 71 | Ht 65.0 in | Wt 313.0 lb

## 2021-02-04 DIAGNOSIS — M7989 Other specified soft tissue disorders: Secondary | ICD-10-CM

## 2021-02-04 DIAGNOSIS — R0602 Shortness of breath: Secondary | ICD-10-CM | POA: Diagnosis not present

## 2021-02-04 DIAGNOSIS — R079 Chest pain, unspecified: Secondary | ICD-10-CM | POA: Diagnosis not present

## 2021-02-04 DIAGNOSIS — R072 Precordial pain: Secondary | ICD-10-CM

## 2021-02-04 DIAGNOSIS — I340 Nonrheumatic mitral (valve) insufficiency: Secondary | ICD-10-CM | POA: Diagnosis not present

## 2021-02-04 MED ORDER — METOPROLOL TARTRATE 100 MG PO TABS: ORAL_TABLET | ORAL | 0 refills | Status: DC

## 2021-02-04 NOTE — Progress Notes (Signed)
New Outpatient Visit Date: 02/04/2021  Referring Provider: Altamese Cabal, PA-C [address] [place],  [place] [address]  Chief Complaint: Chest pain  HPI:  Kristine Rangel is a 65 y.o. female who is being seen today for the evaluation of chest pain at the request of Mr. Yetta Barre. She has a history of hypertension, hyperlipidemia, diabetes mellitus, cerebral aneurysm s/p coiling, COPD, and CHF.  [facility] to [facility] complaining of worsening right leg pain.  She was referred to the ED for further evaluation.  There she, she also complained of a 2-day history of chest pain.  CTA of the chest was negative for PE.  Right lower extremity ultrasound was also negative for DVT.  High-sensitivity troponin I was negative x2.  She was previously evaluated by Dr. Juliann Pares of [facility] cardiology, most recently in 02/2016.  His note makes mention of angina and shortness of breath with recommendation for sleep study.  Myocardial perfusion stress test in 05/2015 was normal.  Echo showed normal LVEF with mild LVH moderate mitral regurgitation, and mild pulmonary hypertension.  Kristine Rangel reports that she has experienced intermittent chest pain that often radiates to the stomach and back over the last 1 to 2 months.  She reports 3-4 episodes in the last month, usually lasting 10 to 15 minutes.  The pain comes on randomly and is not associated with activity or eating.  She has used gas medications without relief.  She is also on a PPI.  She endorses associated dizziness as well as intermittent shortness of breath.  She has not had any palpitations.  She has chronic bilateral lower extremity edema, right greater than left.  She has stable 3 pillow orthopnea.  She feels like her recent chest discomfort is similar to what she experienced in 2017 when evaluated by Dr. Juliann Pares.  --------------------------------------------------------------------------------------------------  Cardiovascular  History & Procedures: Cardiovascular Problems:  Chest pain  Shortness of breath  Mitral regurgitation  Risk Factors:  Hypertension, hyperlipidemia, diabetes mellitus, morbid obesity, age greater than 67, and prior tobacco use  Cath/PCI:  None  CV Surgery:  None  EP Procedures and Devices:  None  Non-Invasive Evaluation(s):  TTE (06/02/2015, [facility]): LVEF greater than 55% with mild LVH.  Moderate mitral and mild tricuspid regurgitation.  Mild pulmonary hypertension.  Pharmacologic MPI (06/02/2015, [facility]): Normal study without ischemia or scar.  LVEF 54%.  Recent CV Pertinent Labs: Lab Results  Component Value Date   INR 1.0 04/27/2020   K 3.2 (L) 01/28/2021   K 3.7 06/20/2012   BUN 10 01/28/2021   BUN 15 06/20/2012   CREATININE 1.04 (H) 01/28/2021   CREATININE 1.19 06/20/2012    --------------------------------------------------------------------------------------------------  Past Medical History:  Diagnosis Date   Anxiety    Arthritis    Asthma    Bladder leak    Cerebral aneurysm    history-has coils   CHF (congestive heart failure) (HCC)    Chronic kidney disease    COPD (chronic obstructive pulmonary disease) (HCC)    Depression    Diabetes mellitus without complication (HCC)    Dyspnea    GERD (gastroesophageal reflux disease)    Glaucoma    Headache    History of kidney stones    Hyperlipidemia    Hypertension    MI (myocardial infarction) (HCC)    unsure when   Neuropathy    Rotator cuff injury    Seasonal allergies    Stroke (HCC)    TIA's  Past Surgical History:  Procedure Laterality Date   BRAIN SURGERY Right 2010   anuerysm repair   CARPAL TUNNEL RELEASE Right 06/12/2017   Procedure: CARPAL TUNNEL RELEASE;  Surgeon: Juanell Fairly, MD;  Location: [facility] ORS;  Service: Orthopedics;  Laterality: Right;   CHOLECYSTECTOMY     JOINT REPLACEMENT Bilateral    TOTAL KNEE REPLACEMENT    SHOULDER ARTHROSCOPY WITH OPEN ROTATOR CUFF REPAIR Left 11/23/2016   Procedure: SHOULDER ARTHROSCOPY WITH OPEN ROTATOR CUFF REPAIR, ARTHROSCOPIC SUBACROMIAL DECOMPRESSION, DISTAL CLAVICLE EXCISION;  Surgeon: Juanell Fairly, MD;  Location: [facility] ORS;  Service: Orthopedics;  Laterality: Left;   SHOULDER ARTHROSCOPY WITH OPEN ROTATOR CUFF REPAIR Right 07/30/2018   Procedure: SHOULDER ARTHROSCOPY WITH OPEN ROTATOR CUFF REPAIR,SUBACROMINAL DECOMPRESSION AND DISTAL CLAVICLE EXCISION;  Surgeon: Juanell Fairly, MD;  Location: [facility] ORS;  Service: Orthopedics;  Laterality: Right;   TOTAL KNEE REVISION Right 05/05/2020   Procedure: right total knee arthroplasty revision;  Surgeon: Lyndle Herrlich, MD;  Location: [facility] ORS;  Service: Orthopedics;  Laterality: Right;    Current Meds  Medication Sig   atorvastatin (LIPITOR) 40 MG tablet Take 40 mg by mouth at bedtime.   brimonidine-timolol (COMBIGAN) 0.2-0.5 % ophthalmic solution Place 1 drop into both eyes 2 (two) times daily.   cetirizine (ZYRTEC) 10 MG tablet Take 10 mg by mouth daily.   DULoxetine (CYMBALTA) 60 MG capsule Take 60 mg by mouth at bedtime.    EPINEPHrine 0.3 mg/0.3 mL IJ SOAJ injection Inject 0.3 mg into the skin as needed for anaphylaxis.    esomeprazole (NEXIUM) 40 MG capsule Take 40 mg by mouth daily.   fluticasone (FLONASE) 50 MCG/ACT nasal spray Place 1 spray into both nostrils daily.    furosemide (LASIX) 40 MG tablet Take 40 mg by mouth 2 (two) times daily.    gabapentin (NEURONTIN) 300 MG capsule Take 300 mg by mouth 3 (three) times daily.   latanoprost (XALATAN) 0.005 % ophthalmic solution Place 1 drop into both eyes at bedtime.   meloxicam (MOBIC) 7.5 MG tablet Take 7.5 mg by mouth 2 (two) times daily.   ondansetron (ZOFRAN) 4 MG tablet Take 1 tablet (4 mg total) by mouth every 6 (six) hours as needed for nausea.   oxybutynin (DITROPAN-XL) 10 MG 24 hr tablet Take 10 mg by mouth at bedtime.    potassium chloride  (K-DUR) 10 MEQ tablet Take 10 mEq by mouth daily.   rivaroxaban (XARELTO) 10 MG TABS tablet Take 1 tablet (10 mg total) by mouth daily with breakfast.   SYMBICORT 160-4.5 MCG/ACT inhaler Inhale 2 puffs into the lungs 2 (two) times daily.   tiZANidine (ZANAFLEX) 4 MG tablet Take 2-4 mg by mouth every 6 (six) hours as needed for muscle spasms.    traMADol (ULTRAM) 50 MG tablet Take 50 mg by mouth every 6 (six) hours as needed.   zolpidem (AMBIEN) 10 MG tablet Take 10 mg by mouth at bedtime as needed.    Allergies: Bee venom and Penicillins  Social History   Tobacco Use   Smoking status: Former Smoker    Quit date: 11/17/1987    Years since quitting: 33.2   Smokeless tobacco: Current User    Types: Snuff  Vaping Use   Vaping Use: Never used  Substance Use Topics   Alcohol use: No   Drug use: No    Family History  Problem Relation Age of Onset   Breast cancer Maternal Aunt    Diabetes Mother     Review of  Systems: A 12-system review of systems was performed and was negative except as noted in the HPI.  --------------------------------------------------------------------------------------------------  Physical Exam: BP 128/78 (BP Location: Right Arm, Patient Position: Sitting, Cuff Size: Large)    Pulse 71    Ht 5\' 5"  (1.651 m)    Wt (!) 313 lb (142 kg)    SpO2 98%    BMI 52.09 kg/m   General: Morbidly obese woman, seated in a wheelchair. HEENT: No conjunctival pallor or scleral icterus. Facemask in place. Neck: Supple without lymphadenopathy, thyromegaly, JVD, or HJR, though evaluation is limited by body habitus. Lungs: Normal work of breathing. Clear to auscultation bilaterally without wheezes or crackles. Heart: Regular rate and rhythm without murmurs, rubs, or gallops. Non-displaced PMI. Abd: Bowel sounds present. Soft, NT/ND without hepatosplenomegaly Ext: Right calf is wrapped.  There is 1-2+ nonpitting edema in both legs. Skin: Warm and dry. Neuro:  CNIII-XII intact. Strength and fine-touch sensation intact in upper and lower extremities bilaterally. Psych: Normal mood and affect.  EKG: Normal sinus rhythm without significant abnormality.  Lab Results  Component Value Date   WBC 4.8 01/28/2021   HGB 12.3 01/28/2021   HCT 38.9 01/28/2021   MCV 84.6 01/28/2021   PLT 449 (H) 01/28/2021    Lab Results  Component Value Date   NA 137 01/28/2021   K 3.2 (L) 01/28/2021   CL 102 01/28/2021   CO2 26 01/28/2021   BUN 10 01/28/2021   CREATININE 1.04 (H) 01/28/2021   GLUCOSE 96 01/28/2021   ALT 21 01/28/2021    --------------------------------------------------------------------------------------------------  ASSESSMENT AND PLAN: Chest pain: Pain is atypical as it is not exertional and seems to happen randomly.  It is similar to what she experienced 5 years ago at which time stress test was normal.  EKG today is unrevealing.  Recent cardiac CTA was negative for PE but did make mention of some esophageal thickening.  Question if her chest pain is GI in nature.  As she has multiple cardiac risk factors, we have agreed to obtain a coronary CTA to exclude obstructive CAD.  Notably, I did not see significant coronary artery calcification on her recent PE protocol chest CT.  We will also obtain an echocardiogram given history of mitral regurgitation and increasing edema.  Mitral regurgitation, shortness of breath, and leg swelling: Echocardiogram in 2017 showed moderate mitral and mild tricuspid regurgitation with preserved LVEF.  Kristine Rangel notes increasing leg swelling over the last couple of weeks.  Examination today is limited by her morbid obesity though she has quite pronounced chronic appearing leg edema.  I suspect some of this may reflect venous insufficiency and/or lymphedema.  Nonetheless given her history of valvular heart disease, I have recommended obtaining a transthoracic echocardiogram.  We will defer medication changes at this  time, continuing furosemide 40 mg twice daily.  Morbid obesity: BMI 52 with multiple comorbidities.  Weight loss encouraged through diet and exercise, though mobility is limited by chronic orthopedic problems.  Follow-up: Return to clinic in 1 month.  Tresa Res, MD 02/04/2021 3:54 PM

## 2021-02-04 NOTE — Patient Instructions (Signed)
Medication Instructions:  Your physician recommends that you continue on your current medications as directed. Please refer to the Current Medication list given to you today.   -- A onetime dose of Metoprolol 100 mg tablet to be taken 2 hours prior to your Cardiac CTA has been sent to your pharmacy.  Try to limit your use of Meloxicam.   *If you need a refill on your cardiac medications before your next appointment, please call your pharmacy*   Lab Work: None ordered If you have labs (blood work) drawn today and your tests are completely normal, you will receive your results only by: Marland Kitchen MyChart Message (if you have MyChart) OR . A paper copy in the mail If you have any lab test that is abnormal or we need to change your treatment, we will call you to review the results.   Testing/Procedures: Your physician has requested that you have an echocardiogram. Echocardiography is a painless test that uses sound waves to create images of your heart. It provides your doctor with information about the size and shape of your heart and how well your heart's chambers and valves are working. This procedure takes approximately one hour. There are no restrictions for this procedure.  Your physician has requested that you have cardiac CT. Cardiac computed tomography (CT) is a painless test that uses an x-ray machine to take clear, detailed pictures of your heart. For further information please visit [email or web address] Please follow instruction sheet as given.      Follow-Up: At [facility], you and your health needs are our priority.  As part of our continuing mission to provide you with exceptional heart care, we have created designated Provider Care Teams.  These Care Teams include your primary Cardiologist (physician) and Advanced Practice Providers (APPs -  Physician Assistants and Nurse Practitioners) who all work together to provide you with the care you need, when you need it.  We recommend  signing up for the patient portal called "MyChart".  Sign up information is provided on this After Visit Summary.  MyChart is used to connect with patients for Virtual Visits (Telemedicine).  Patients are able to view lab/test results, encounter notes, upcoming appointments, etc.  Non-urgent messages can be sent to your provider as well.   To learn more about what you can do with MyChart, go to [email or web address].    Your next appointment:   4 week(s)  The format for your next appointment:   In Person  Provider:   You may see  Yvonne Kendall, MD or one of the following Advanced Practice Providers on your designated Care Team:    Nicolasa Ducking, NP  Eula Listen, PA-C  Marisue Ivan, PA-C  Cadence Fransico Michael, [place]  Gillian Shields, NP    Other Instructions Your cardiac CT will be scheduled at one of the below locations:   [facility] [address] [place], [place] [address] [phone]  OR  [facility] [address] [place], [place] [address] [phone]  If scheduled at [facility], please arrive at the [facility] main entrance (entrance A) of [facility] 30 minutes prior to test start time. Proceed to the [facility] Radiology Department (first floor) to check-in and test prep.  If scheduled at [facility], please arrive 15 mins early for check-in and test prep.  Please follow these instructions carefully (unless otherwise directed):    On the Night Before the Test: . Be sure to  Drink plenty of water. . Do not consume any caffeinated/decaffeinated beverages or chocolate 12 hours prior to your test. . Do not take any antihistamines 12 hours prior to your test.  On the Day of the Test: . Drink plenty of water until 1 hour prior to the test. . Do not eat any food 4 hours prior to the test. . You may take your regular medications prior to the test.  . Take  metoprolol (Lopressor) two hours prior to test. . HOLD Furosemide morning of the test. . FEMALES- please wear underwire-free bra if available       After the Test: . Drink plenty of water. . After receiving IV contrast, you may experience a mild flushed feeling. This is normal. . On occasion, you may experience a mild rash up to 24 hours after the test. This is not dangerous. If this occurs, you can take Benadryl 25 mg and increase your fluid intake. . If you experience trouble breathing, this can be serious. If it is severe call 911 IMMEDIATELY. If it is mild, please call our office. . If you take any of these medications: Glipizide/Metformin, Avandament, Glucavance, please do not take 48 hours after completing test unless otherwise instructed.   Once we have confirmed authorization from your insurance company, we will call you to set up a date and time for your test. Based on how quickly your insurance processes prior authorizations requests, please allow up to 4 weeks to be contacted for scheduling your Cardiac CT appointment. Be advised that routine Cardiac CT appointments could be scheduled as many as 8 weeks after your provider has ordered it.  For non-scheduling related questions, please contact the cardiac imaging nurse navigator should you have any questions/concerns: Rockwell Alexandria, Cardiac Imaging Nurse Navigator Larey Brick, Cardiac Imaging Nurse Navigator [facility] Direct Office Dial: [phone]   For scheduling needs, including cancellations and rescheduling, please call [place], [phone].

## 2021-02-05 ENCOUNTER — Encounter: Payer: Self-pay | Admitting: Internal Medicine

## 2021-02-05 DIAGNOSIS — R079 Chest pain, unspecified: Secondary | ICD-10-CM | POA: Insufficient documentation

## 2021-02-05 DIAGNOSIS — R0602 Shortness of breath: Secondary | ICD-10-CM | POA: Insufficient documentation

## 2021-02-05 DIAGNOSIS — M7989 Other specified soft tissue disorders: Secondary | ICD-10-CM | POA: Insufficient documentation

## 2021-02-05 DIAGNOSIS — I340 Nonrheumatic mitral (valve) insufficiency: Secondary | ICD-10-CM | POA: Insufficient documentation

## 2021-02-23 ENCOUNTER — Telehealth (HOSPITAL_COMMUNITY): Payer: Self-pay | Admitting: *Deleted

## 2021-02-23 NOTE — Telephone Encounter (Signed)
Attempted to call patient regarding upcoming cardiac CT appointment. °Left message on voicemail with name and callback number ° °Brentley Horrell RN Navigator Cardiac Imaging °[facility] °[phone] Office °[phone] Cell ° °

## 2021-02-23 NOTE — Telephone Encounter (Signed)
Pt returning call regarding upcoming cardiac imaging study; pt verbalizes understanding of appt date/time, parking situation and where to check in, pre-test NPO status and medications ordered, and verified current allergies; name and call back number provided for further questions should they arise  Kaleigha Chamberlin RN Navigator Cardiac Imaging [facility] [phone] office [phone] cell  

## 2021-02-24 ENCOUNTER — Other Ambulatory Visit: Payer: Self-pay

## 2021-02-24 ENCOUNTER — Ambulatory Visit
Admission: RE | Admit: 2021-02-24 | Discharge: 2021-02-24 | Disposition: A | Discharge status: Home / Self Care | Payer: Medicare HMO | Source: Ambulatory Visit | Attending: Internal Medicine | Admitting: Internal Medicine

## 2021-02-24 DIAGNOSIS — E785 Hyperlipidemia, unspecified: Secondary | ICD-10-CM | POA: Diagnosis not present

## 2021-02-24 DIAGNOSIS — Z6841 Body Mass Index (BMI) 40.0 and over, adult: Secondary | ICD-10-CM | POA: Insufficient documentation

## 2021-02-24 DIAGNOSIS — R079 Chest pain, unspecified: Secondary | ICD-10-CM

## 2021-02-24 DIAGNOSIS — R0602 Shortness of breath: Secondary | ICD-10-CM

## 2021-02-24 DIAGNOSIS — I10 Essential (primary) hypertension: Secondary | ICD-10-CM | POA: Diagnosis not present

## 2021-02-24 DIAGNOSIS — I340 Nonrheumatic mitral (valve) insufficiency: Secondary | ICD-10-CM | POA: Diagnosis not present

## 2021-02-24 DIAGNOSIS — E119 Type 2 diabetes mellitus without complications: Secondary | ICD-10-CM | POA: Insufficient documentation

## 2021-02-24 MED ORDER — DILTIAZEM HCL 25 MG/5ML IV SOLN
10.0000 mg | Freq: Once | INTRAVENOUS | Status: AC
  Administered 2021-02-24: 10 mg via INTRAVENOUS

## 2021-02-24 MED ORDER — NITROGLYCERIN 0.4 MG SL SUBL
0.8000 mg | SUBLINGUAL_TABLET | Freq: Once | SUBLINGUAL | Status: AC
  Administered 2021-02-24: 0.8 mg via SUBLINGUAL

## 2021-02-24 MED ORDER — IOHEXOL 350 MG/ML SOLN
100.0000 mL | Freq: Once | INTRAVENOUS | Status: AC | PRN
  Administered 2021-02-24: 100 mL via INTRAVENOUS

## 2021-02-24 MED ORDER — METOPROLOL TARTRATE 5 MG/5ML IV SOLN
10.0000 mg | Freq: Once | INTRAVENOUS | Status: AC
  Administered 2021-02-24: 10 mg via INTRAVENOUS

## 2021-02-24 NOTE — Progress Notes (Signed)
Patient tolerated CT well. Drank water and ate peanut butter crackers after. Vital signs stable encourage to drink water throughout day.Reasons explained and verbalized understanding. Ambulated steady using rolling walker bathroom and to exit.

## 2021-03-03 ENCOUNTER — Other Ambulatory Visit: Payer: Self-pay

## 2021-03-03 ENCOUNTER — Ambulatory Visit (INDEPENDENT_AMBULATORY_CARE_PROVIDER_SITE_OTHER): Payer: Medicare HMO

## 2021-03-03 DIAGNOSIS — R0602 Shortness of breath: Secondary | ICD-10-CM

## 2021-03-03 DIAGNOSIS — I340 Nonrheumatic mitral (valve) insufficiency: Secondary | ICD-10-CM | POA: Diagnosis not present

## 2021-03-03 DIAGNOSIS — M7989 Other specified soft tissue disorders: Secondary | ICD-10-CM | POA: Diagnosis not present

## 2021-03-03 LAB — ECHOCARDIOGRAM COMPLETE
Area-P 1/2: 3.36 cm2
S' Lateral: 2.8 cm

## 2021-03-11 ENCOUNTER — Ambulatory Visit (INDEPENDENT_AMBULATORY_CARE_PROVIDER_SITE_OTHER): Payer: Medicare HMO | Admitting: Physician Assistant

## 2021-03-11 ENCOUNTER — Encounter: Payer: Self-pay | Admitting: Physician Assistant

## 2021-03-11 ENCOUNTER — Other Ambulatory Visit: Payer: Self-pay

## 2021-03-11 VITALS — BP 152/94 | HR 77 | Ht 65.0 in | Wt 300.0 lb

## 2021-03-11 DIAGNOSIS — I10 Essential (primary) hypertension: Secondary | ICD-10-CM | POA: Diagnosis not present

## 2021-03-11 DIAGNOSIS — M7989 Other specified soft tissue disorders: Secondary | ICD-10-CM

## 2021-03-11 DIAGNOSIS — R0789 Other chest pain: Secondary | ICD-10-CM

## 2021-03-11 DIAGNOSIS — G4730 Sleep apnea, unspecified: Secondary | ICD-10-CM

## 2021-03-11 DIAGNOSIS — I5189 Other ill-defined heart diseases: Secondary | ICD-10-CM

## 2021-03-11 DIAGNOSIS — I890 Lymphedema, not elsewhere classified: Secondary | ICD-10-CM

## 2021-03-11 MED ORDER — ISOSORBIDE MONONITRATE ER 30 MG PO TB24: 30.0000 mg | ORAL_TABLET | Freq: Every day | ORAL | 1 refills | Status: DC

## 2021-03-11 MED ORDER — VALSARTAN 40 MG PO TABS: 40.0000 mg | ORAL_TABLET | Freq: Every day | ORAL | 1 refills | Status: DC

## 2021-03-11 NOTE — Patient Instructions (Addendum)
Medication Instructions:  Your physician has recommended you make the following change in your medication:   START Isosorbide (Imdur)  daily   START Valsartan  daily   *If you need a refill on your cardiac medications before your next appointment, please call your pharmacy*   Lab Work: Your physician recommends that you have lab work TODAY: BMET  If you have labs (blood work) drawn today and your tests are completely normal, you will receive your results only by: Marland Kitchen MyChart Message (if you have MyChart) OR . A paper copy in the mail If you have any lab test that is abnormal or we need to change your treatment, we will call you to review the results.   Testing/Procedures:  WatchPAT?  Is a FDA cleared portable home sleep study test that uses a watch and 3 points of contact to monitor 7 different channels, including your heart rate, oxygen saturations, body position, snoring, and chest motion.  The study is easy to use from the comfort of your own home and accurately detect sleep apnea.  Before bed, you attach the chest sensor, attached the sleep apnea bracelet to your nondominant hand, and attach the finger probe.  After the study, the raw data is downloaded from the watch and scored for apnea events.   For more information: [email or web address]    Follow-Up: At [facility], you and your health needs are our priority.  As part of our continuing mission to provide you with exceptional heart care, we have created designated Provider Care Teams.  These Care Teams include your primary Cardiologist (physician) and Advanced Practice Providers (APPs -  Physician Assistants and Nurse Practitioners) who all work together to provide you with the care you need, when you need it.  We recommend signing up for the patient portal called "MyChart".  Sign up information is provided on this After Visit Summary.  MyChart is used to connect with patients for Virtual Visits  (Telemedicine).  Patients are able to view lab/test results, encounter notes, upcoming appointments, etc.  Non-urgent messages can be sent to your provider as well.   To learn more about what you can do with MyChart, go to [email or web address].    Your next appointment:   2 - 3 week(s)  The format for your next appointment:   In Person  Provider:   You may see Dr. Okey Dupre or one of the following Advanced Practice Providers on your designated Care Team:     Marisue Ivan, PA-C    Other Instructions  We will be in contact with you regarding referral to Lymphedema clinic.   Lymphedema  Lymphedema is swelling that is caused by the abnormal collection of lymph in the tissues under the skin. Lymph is excess fluid from the tissues in your body that is removed through the lymphatic system. This system is part of your body's defense system (immune system) and includes lymph nodes and lymph vessels. The lymph vessels collect and carry the excess fluid, fats, proteins, and waste from the tissues of the body to the bloodstream. This system also works to clean and remove bacteria and waste products from the body. Lymphedema occurs when the lymphatic system is blocked. When the lymph vessels or lymph nodes are blocked or damaged, lymph does not drain properly. This causes an abnormal buildup of lymph, which leads to swelling in the affected area. This may include the trunk area, or an arm or leg. Lymphedema cannot be cured by medicines, but various methods can be used  to help reduce the swelling. What are the causes? The cause of this condition depends on the type of lymphedema that you have.  Primary lymphedema is caused by the absence of lymph vessels or having abnormal lymph vessels at birth.  Secondary lymphedema occurs when lymph vessels are blocked or damaged. Secondary lymphedema is more common. Common causes of lymph vessel blockage include: ? Skin infection, such as  cellulitis. ? Infection by parasites (filariasis). ? Injury. ? Radiation therapy. ? Cancer. ? Formation of scar tissue. ? Surgery. What are the signs or symptoms? Symptoms of this condition include:  Swelling of the arm or leg.  A heavy or tight feeling in the arm or leg.  Swelling of the feet, toes, or fingers. Shoes or rings may fit more tightly than before.  Redness of the skin over the affected area.  Limited movement of the affected limb.  Sensitivity to touch or discomfort in the affected limb. How is this diagnosed? This condition may be diagnosed based on:  Your symptoms and medical history.  A physical exam.  Bioimpedance spectroscopy. In this test, painless electrical currents are used to measure fluid levels in your body.  Imaging tests, such as: ? MRI. ? CT scan. ? Duplex ultrasound. This test uses sound waves to produce images of the vessels and the blood flow on a screen. ? Lymphoscintigraphy. In this test, a low dose of a radioactive substance is injected to trace the flow of lymph through your lymph vessels. ? Lymphangiography. In this test, a contrast dye is injected into the lymph vessel to help show blockages. How is this treated? If an underlying condition is causing the lymphedema, that condition will be treated. For example, antibiotic medicines may be used to treat an infection. Treatment for this condition will depend on the cause of your lymphedema. Treatment may include:  Complete decongestive therapy (CDT). This is done by a certified lymphedema therapist to reduce fluid congestion. This therapy includes: ? Skin care. ? Compression wrapping of the affected area. ? Manual lymph drainage. This is a special massage technique that promotes lymph drainage out of a limb. ? Specific exercises. Certain exercises can help fluid move out of the affected limb.  Compression. Various methods may be used to apply pressure to the affected limb to reduce the  swelling. They include: ? Wearing compression stockings or sleeves on the affected limb. ? Wrapping the affected limb with special bandages.  Surgery. This is usually done for severe cases only. For example, surgery may be done if you have trouble moving the limb or if the swelling does not get better with other treatments.   Follow these instructions at home: Self-care  The affected area is more likely to become injured or infected. Take these steps to help prevent infection: ? Keep the affected area clean and dry. ? Use approved creams or lotions to keep the skin moisturized. ? Protect your skin from cuts:  Use gloves while cooking or gardening.  Do not walk barefoot.  If you shave the affected area, use an Neurosurgeon.  Do not wear tight clothes, shoes, or jewelry.  Eat a healthy diet that includes a lot of fruits and vegetables. Activity  Do exercises as told by your health care provider.  Do not sit with your legs crossed.  When possible, keep the affected limb raised (elevated) above the level of your heart.  Avoid carrying things with an arm that is affected by lymphedema. General instructions  Wear compression  stockings or sleeves as told by your health care provider.  Note any changes in size of the affected limb. You may be instructed to take regular measurements and keep track of them.  Take over-the-counter and prescription medicines only as told by your health care provider.  If you were prescribed an antibiotic medicine, take or apply it as told by your health care provider. Do not stop using the antibiotic even if you start to feel better or if your condition improves.  Do not use heating pads or ice packs on the affected area.  Avoid having blood draws, IV insertions, or blood pressure checks on the affected limb.  Keep all follow-up visits. This is important. Contact a health care provider if you:  Continue to have swelling in your limb.  Have  fluid leaking from the skin of your swollen limb.  Have a cut that does not heal.  Have redness or pain in the affected area.  Develop purplish spots, rash, blisters, or sores (lesions) on your affected limb. Get help right away if you:  Have new swelling in your limb that starts suddenly.  Have shortness of breath or chest pain.  Have a fever or chills. These symptoms may represent a serious problem that is an emergency. Do not wait to see if the symptoms will go away. Get medical help right away. Call your local emergency services (911 in the [place] Do not drive yourself to the hospital. Summary  Lymphedema is swelling that is caused by the abnormal collection of lymph in the tissues under the skin.  Lymph is fluid from the tissues in your body that is removed through the lymphatic system. This system collects and carries excess fluid, fats, proteins, and wastes from the tissues of the body to the bloodstream.  Lymphedema causes swelling, pain, and redness in the affected area. This may include the trunk area, or an arm or leg.  Treatment for this condition may depend on the cause of your lymphedema. Treatment may include treating the underlying cause, complete decongestive therapy (CDT), compression methods, or surgery. This information is not intended to replace advice given to you by your health care provider. Make sure you discuss any questions you have with your health care provider. Document Revised: 08/25/2020 Document Reviewed: 08/25/2020 Elsevier Patient Education  2021 Elsevier Inc.   Blood pressure: --We recommend upper arm BP cuffs over that of the wrist. --You can always check your device's accuracy against an office model once a year if possible. You can do so by bringing your cuff into the office and notifying the person that rooms you that you would like to check your cuff against our office readings. --Measure your BP at the same time each day. Blood pressure  varies often throughout the day with higher readings in the morning. BP may also be lower at home than in the office. Do not measure BP right after you wake up or after exercising. Avoid caffeine, tobacco, and alcohol for 30 minutes before taking a measurement.  --Sit quietly for five minutes in a comfortable position with your legs and ankles uncrossed and back supported. Feet flat on the ground. Have your arm supported and at the level of your heart. Always use the same arm when taking your blood pressure. Place the cuff over bare skin rather than clothing. Each time you measure, take an additional reading if abnormal to ensure accurate by waiting 1-3 minutes after the first reading. --Please bring a BP log into the  office. It is helpful to document the time of each BP reading, as well as any activity or medications taken around the reading. In addition, it is helpful to include heart rate at the time of the BP reading. Daily weights are also encouraged. --Goal BP is 130/80 or lower. If your BP is low but you are not dizzy, this is fine and preferred over BP higher than 130/80. If your BP is less than 100 for the top number and you are dizzy, call the office. If your blood pressure is consistently elevated with top number above 180 and bottom above 120, this can damage the body. If you have severe increase in your blood pressure or concerning symptoms of severe chest pain, headache with confusion and blurred vision, severe abdominal or back pain, shortness of breath, seizures, or loss of consciousness, go to the emergency department.    Heart-Healthy Eating Plan Heart-healthy meal planning includes:  Eating less unhealthy fats.  Eating more healthy fats.  Making other changes in your diet. Talk with your doctor or a diet specialist (dietitian) to create an eating plan that is right for you.  What are tips for following this plan? Cooking Avoid frying your food. Try to bake, boil, grill, or broil  it instead. You can also reduce fat by:  Removing the skin from poultry.  Removing all visible fats from meats.  Steaming vegetables in water or broth. Meal planning  At meals, divide your plate into four equal parts: ? Fill one-half of your plate with vegetables and green salads. ? Fill one-fourth of your plate with whole grains. ? Fill one-fourth of your plate with lean protein foods.  Eat 4-5 servings of vegetables per day. A serving of vegetables is: ? 1 cup of raw or cooked vegetables. ? 2 cups of raw leafy greens.  Eat 4-5 servings of fruit per day. A serving of fruit is: ? 1 medium whole fruit. ?  cup of dried fruit. ?  cup of fresh, frozen, or canned fruit. ?  cup of 100% fruit juice.  Eat more foods that have soluble fiber. These are apples, broccoli, carrots, beans, peas, and barley. Try to get 20-30 g of fiber per day.  Eat 4-5 servings of nuts, legumes, and seeds per week: ? 1 serving of dried beans or legumes equals  cup after being cooked. ? 1 serving of nuts is  cup. ? 1 serving of seeds equals 1 tablespoon.   General information  Eat more home-cooked food. Eat less restaurant, buffet, and fast food.  Limit or avoid alcohol.  Limit foods that are high in starch and sugar.  Avoid fried foods.  Lose weight if you are overweight.  Keep track of how much salt (sodium) you eat. This is important if you have high blood pressure. Ask your doctor to tell you more about this.  Try to add vegetarian meals each week. Fats  Choose healthy fats. These include olive oil and canola oil, flaxseeds, walnuts, almonds, and seeds.  Eat more omega-3 fats. These include salmon, mackerel, sardines, tuna, flaxseed oil, and ground flaxseeds. Try to eat fish at least 2 times each week.  Check food labels. Avoid foods with trans fats or high amounts of saturated fat.  Limit saturated fats. ? These are often found in animal products, such as meats, butter, and  cream. ? These are also found in plant foods, such as palm oil, palm kernel oil, and coconut oil.  Avoid foods with partially hydrogenated  oils in them. These have trans fats. Examples are stick margarine, some tub margarines, cookies, crackers, and other baked goods. What foods can I eat? Fruits All fresh, canned (in natural juice), or frozen fruits. Vegetables Fresh or frozen vegetables (raw, steamed, roasted, or grilled). Green salads. Grains Most grains. Choose whole wheat and whole grains most of the time. Rice and pasta, including brown rice and pastas made with whole wheat. Meats and other proteins Lean, well-trimmed beef, veal, pork, and lamb. Chicken and [place] without skin. All fish and shellfish. Wild duck, rabbit, pheasant, and venison. Egg whites or low-cholesterol egg substitutes. Dried beans, peas, lentils, and tofu. Seeds and most nuts. Dairy Low-fat or nonfat cheeses, including ricotta and mozzarella. Skim or 1% milk that is liquid, powdered, or evaporated. Buttermilk that is made with low-fat milk. Nonfat or low-fat yogurt. Fats and oils Non-hydrogenated (trans-free) margarines. Vegetable oils, including soybean, sesame, sunflower, olive, peanut, safflower, corn, canola, and cottonseed. Salad dressings or mayonnaise made with a vegetable oil. Beverages Mineral water. Coffee and tea. Diet carbonated beverages. Sweets and desserts Sherbet, gelatin, and fruit ice. Small amounts of dark chocolate. Limit all sweets and desserts. Seasonings and condiments All seasonings and condiments. The items listed above may not be a complete list of foods and drinks you can eat. Contact a dietitian for more options. What foods should I avoid? Fruits Canned fruit in heavy syrup. Fruit in cream or butter sauce. Fried fruit. Limit coconut. Vegetables Vegetables cooked in cheese, cream, or butter sauce. Fried vegetables. Grains Breads that are made with saturated or trans fats, oils, or  whole milk. Croissants. Sweet rolls. Donuts. High-fat crackers, such as cheese crackers. Meats and other proteins Fatty meats, such as hot dogs, ribs, sausage, bacon, rib-eye roast or steak. High-fat deli meats, such as salami and bologna. Caviar. Domestic duck and goose. Organ meats, such as liver. Dairy Cream, sour cream, cream cheese, and creamed cottage cheese. Whole-milk cheeses. Whole or 2% milk that is liquid, evaporated, or condensed. Whole buttermilk. Cream sauce or high-fat cheese sauce. Yogurt that is made from whole milk. Fats and oils Meat fat, or shortening. Cocoa butter, hydrogenated oils, palm oil, coconut oil, palm kernel oil. Solid fats and shortenings, including bacon fat, salt pork, lard, and butter. Nondairy cream substitutes. Salad dressings with cheese or sour cream. Beverages Regular sodas and juice drinks with added sugar. Sweets and desserts Frosting. Pudding. Cookies. Cakes. Pies. Milk chocolate or white chocolate. Buttered syrups. Full-fat ice cream or ice cream drinks. The items listed above may not be a complete list of foods and drinks to avoid. Contact a dietitian for more information. Summary  Heart-healthy meal planning includes eating less unhealthy fats, eating more healthy fats, and making other changes in your diet.  Eat a balanced diet. This includes fruits and vegetables, low-fat or nonfat dairy, lean protein, nuts and legumes, whole grains, and heart-healthy oils and fats. This information is not intended to replace advice given to you by your health care provider. Make sure you discuss any questions you have with your health care provider. Document Revised: 01/03/2018 Document Reviewed: 12/07/2017 Elsevier Patient Education  2021 [employer]

## 2021-03-11 NOTE — Addendum Note (Signed)
Addended by: Bryna Colander on: 03/11/2021 02:44 PM   Modules accepted: Orders

## 2021-03-11 NOTE — Addendum Note (Signed)
Addended by: Lanny Hurst on: 03/11/2021 01:41 PM   Modules accepted: Orders

## 2021-03-11 NOTE — Progress Notes (Signed)
Office Visit    Patient Name: Kristine Rangel Date of Encounter: 03/11/2021  PCP:  Leanna SatoMiles, Linda M, MD   [facility]  Cardiologist:  Yvonne Kendallhristopher End, MD  Advanced Practice Provider:  No care team member to display Electrophysiologist:  None   Chief Complaint    Chief Complaint  Patient presents with  . Follow-up    Follow up for testing results. Medications verbally reviewed with patient     65 y.o. female with history of hypertension, hyperlipidemia, DM2, cerebral aneurysm s/p coiling, COPD, HFpEF, and here today for follow-up of coronary CTA and echo.  Past Medical History    Past Medical History:  Diagnosis Date  . Anxiety   . Arthritis   . Asthma   . Bladder leak   . Cerebral aneurysm    history-has coils  . CHF (congestive heart failure) (HCC)   . Chronic kidney disease   . COPD (chronic obstructive pulmonary disease) (HCC)   . Depression   . Diabetes mellitus without complication (HCC)   . Dyspnea   . GERD (gastroesophageal reflux disease)   . Glaucoma   . Headache   . History of kidney stones   . Hyperlipidemia   . Hypertension   . MI (myocardial infarction) (HCC)    unsure when  . Neuropathy   . Rotator cuff injury   . Seasonal allergies   . Stroke [facility](HCC)    TIA's   Past Surgical History:  Procedure Laterality Date  . BRAIN SURGERY Right 2010   anuerysm repair  . CARPAL TUNNEL RELEASE Right 06/12/2017   Procedure: CARPAL TUNNEL RELEASE;  Surgeon: Juanell FairlyKrasinski, Kevin, MD;  Location: [facility] ORS;  Service: Orthopedics;  Laterality: Right;  . CHOLECYSTECTOMY    . JOINT REPLACEMENT Bilateral    TOTAL KNEE REPLACEMENT  . SHOULDER ARTHROSCOPY WITH OPEN ROTATOR CUFF REPAIR Left 11/23/2016   Procedure: SHOULDER ARTHROSCOPY WITH OPEN ROTATOR CUFF REPAIR, ARTHROSCOPIC SUBACROMIAL DECOMPRESSION, DISTAL CLAVICLE EXCISION;  Surgeon: Juanell FairlyKevin Krasinski, MD;  Location: [facility] OR[facility];  Service: Orthopedics;  Laterality: Left;  . SHOULDER ARTHROSCOPY  WITH OPEN ROTATOR CUFF REPAIR Right 07/30/2018   Procedure: SHOULDER ARTHROSCOPY WITH OPEN ROTATOR CUFF REPAIR,SUBACROMINAL DECOMPRESSION AND DISTAL CLAVICLE EXCISION;  Surgeon: Juanell FairlyKrasinski, Kevin, MD;  Location: [facility] OR[facility];  Service: Orthopedics;  Laterality: Right;  . TOTAL KNEE REVISION Right 05/05/2020   Procedure: right total knee arthroplasty revision;  Surgeon: Lyndle HerrlichBowers, James R, MD;  Location: [facility] OR[facility];  Service: Orthopedics;  Laterality: Right;    Allergies  Allergies  Allergen Reactions  . Bee Venom Anaphylaxis  . Penicillins Hives, Swelling and Other (See Comments)    Has patient had a PCN reaction causing immediate rash, facial/tongue/throat swelling, SOB or lightheadedness with hypotension: Yes Has patient had a PCN reaction causing severe rash involving mucus membranes or skin necrosis: No Has patient had a PCN reaction that required hospitalization No Has patient had a PCN reaction occurring within the last 10 years: No If all of the above answers are "NO", then may proceed with Cephalosporin use.    History of Present Illness    Kristine DrossCathy A Jeanlouis is a 65 y.o. female with PMH as above.  She was previously [facility] with most recent visit 02/2016.  At that time, notes referenced angina and shortness of breath with recommendation for sleep study.  MPI from 05/2015 normal.  Echo showed normal LVEF with mild LVH and moderate MR, as well as mild pulmonary  hypertension.  [facility], complaining of worsening right leg pain.  She was referred to the ED for further evaluation, where she reported a 2-day history of chest pain.  CTA of the chest negative for PE.  Right lower extremity ultrasound negative for DVT.  High-sensitivity troponin negative x2.    She was initially seen in the office 02/04/2021 by Dr. Okey Dupre for chest pain at the request of Mr. Yetta Barre.  She reported intermittent chest  pain, radiating to the stomach and back, and occurring over the last 1 to 2 months.  She reported 3-4 episodes in the last month, usually lasting 10 to 15 minutes.  The pain occurred randomly and was not associated with activity or eating.  She had tried gas medications without relief.  She was also on a PPI.  She had associated dizziness, as well as intermittent shortness of breath.  No palpitations.  She reported chronic bilateral lower extremity edema with right greater than that of left.  She had stable 3 pillow orthopnea.  She reported her recent chest discomfort is similar to that of 2017 when evaluated by Dr. Juliann Pares.  It was felt that her chest pain was atypical, as it was not exertional and occurred randomly.  GI etiology was considered, given recent CTA with esophageal thickening.  Coronary CTA was obtained with coronary artery calcium score of 0.  Echo was obtained with results as below and EF 60 to 65%, G2 DD, RVSP 32.6 mmHg with RAP estimated 3 mmHg, mild LAE,.  Exam was limited by body habitus but lower extremity edema thought to reflect venous insufficiency and/or lymphedema.  She was continued on Lasix 40 mg twice daily.  Diet and exercise recommended.  Today, 03/11/2021, she returns to clinic and notes that she overall feels slightly improved from her previous visit.   Right lower extremity edema is greater than that of left, as she recently had a right knee replacement.  She reportedly used to have lymphedema pumps in the past but lost them, though she did note that they were helpful.  She used to wear thigh-high compression stockings, but she notes that these were cut into her legs, so she had to have them cut off her.  She reports her volume status as similar to that of previous visits on Lasix 40 mg twice daily.  She has stable 3 pillow orthopnea.  At night, she reports waking up coughing from sleep frequently.  She also reports fatigue during the day, as well as a history of snoring.  She  does not feel well rested during the day.  She has yet to perform a sleep study and is interested in doing so.  She continued to experience intermittent chest pain and shortness of breath without clear triggers and not necessarily related to exertion.  She reports that she is going to get an appointment with GI, given esophageal thickening seen on CT.  BP noted to be elevated with patient report that she was trying to get salt out of her house.  She checks her blood pressure at home with these readings also elevated.  She is trying to overhauled her diet and lifestyle.  She is now trying to walk every day.  She removed all salt from her home to reduce temptation.  She is now eating 3 small meals per day.  She was working on joining a [employer] to become more active.  She requests information about lymphedema.  She reports fatigue and poor sleep with STOP-BANG assessment performed and scoring 8 at high risk of OSA.  Both echo and coronary CTA reviewed today.  STOP-BANG Rex assessment  S-snore  Have you been told that you snore?   Y  T-tired Are you often tired, fatigued, sleepy during the day?   Y  O-obstruction Do you stop breathing, choke, or gasp during sleep?   Y  P-pressure Do you have or are you being treated for high blood pressure?  Y  B- BMI Is your BMI greater than 35 kg/m?  Y  A- age Are you 50 years or older?  Y  N-neck Do you have a neck circumference greater than 16 inches?  Y  G-gender Are you female?   N  Total  7   Risk Score <3 Low risk of OSA ?3 High risk of OSA  Home Medications    Current Outpatient Medications on File Prior to Visit  Medication Sig Dispense Refill  . aspirin EC 81 MG tablet Take 81 mg by mouth daily. Swallow whole.    Marland Kitchen atorvastatin (LIPITOR) 40 MG tablet Take 40 mg by mouth at bedtime.    . brimonidine-timolol (COMBIGAN) 0.2-0.5 % ophthalmic solution Place 1 drop into both eyes 2 (two) times daily.    . cetirizine (ZYRTEC) 10 MG  tablet Take 10 mg by mouth daily.    . DULoxetine (CYMBALTA) 60 MG capsule Take 60 mg by mouth at bedtime.     Marland Kitchen EPINEPHrine 0.3 mg/0.3 mL IJ SOAJ injection Inject 0.3 mg into the skin as needed for anaphylaxis.     Marland Kitchen esomeprazole (NEXIUM) 40 MG capsule Take 40 mg by mouth daily.    . fluticasone (FLONASE) 50 MCG/ACT nasal spray Place 1 spray into both nostrils daily.     . furosemide (LASIX) 40 MG tablet Take 40 mg by mouth 2 (two) times daily.     Marland Kitchen gabapentin (NEURONTIN) 300 MG capsule Take 300 mg by mouth 3 (three) times daily.    Marland Kitchen latanoprost (XALATAN) 0.005 % ophthalmic solution Place 1 drop into both eyes at bedtime.    . meloxicam (MOBIC) 7.5 MG tablet Take 7.5 mg by mouth 2 (two) times daily.    Marland Kitchen MYRBETRIQ 25 MG TB24 tablet TAKE 1 TABLET BY MOUTH EVERY DAY for bladder    . SYMBICORT 160-4.5 MCG/ACT inhaler Inhale 2 puffs into the lungs 2 (two) times daily.    Marland Kitchen tiZANidine (ZANAFLEX) 4 MG tablet Take 2-4 mg by mouth every 6 (six) hours as needed for muscle spasms.     . traMADol (ULTRAM) 50 MG tablet Take 50 mg by mouth every 6 (six) hours as needed.    . zolpidem (AMBIEN) 10 MG tablet Take 10 mg by mouth at bedtime as needed.     No current facility-administered medications on file prior to visit.    Review of Systems    She reports intermittent chest pain and shortness of breath without clear triggers. She reports stable/unchanged dyspnea and 3 pillow orthopnea.  She is occasionally dizzy.  She does wake up at times choking.  She reports daytime fatigue.  No palpitations,n, v, syncope, weight gain, or early satiety.  All other systems reviewed and are otherwise negative except as noted above.  Physical Exam    VS:  BP (!) 152/94 (BP Location: Left Arm, Patient Position: Sitting, Cuff Size: Large)   Pulse 77   Ht 5\' 5"  (1.651 m)   Wt 300 lb (136.1  kg)   SpO2 98%   BMI 49.92 kg/m  , BMI Body mass index is 49.92 kg/m. GEN: Obese female, in no acute distress. HEENT:  normal. Neck: Supple, no JVD, carotid bruits, or masses. Cardiac: RRR, no murmurs, rubs, or gallops. No clubbing, cyanosis. bilateral moderate nonpitting edema.  Radials/DP/PT 2+ and equal bilaterally.  Respiratory:  Respirations regular and unlabored, clear to auscultation bilaterally. GI: Soft, nontender, nondistended, BS + x 4. MS: no deformity or atrophy.  Right lower extremity with knee and leg wrap.  Nonpitting edema present in both lower extremities. Skin: warm and dry, no rash. Neuro:  Strength and sensation are intact. Psych: Normal affect.  Accessory Clinical Findings    ECG personally reviewed by me today -NSR, 77 bpm, 150 ms, QRS 86 ms, repolarization changes- no acute changes.  VITALS Reviewed today   Temp Readings from Last 3 Encounters:  01/28/21 98.3 F (36.8 C) (Oral)  05/07/20 97.9 F (36.6 C) (Oral)  12/04/18 97.6 F (36.4 C) (Oral)   BP Readings from Last 3 Encounters:  03/11/21 (!) 152/94  02/24/21 125/79  02/04/21 128/78   Pulse Readings from Last 3 Encounters:  03/11/21 77  02/24/21 (!) 58  02/04/21 71    Wt Readings from Last 3 Encounters:  03/11/21 300 lb (136.1 kg)  02/04/21 (!) 313 lb (142 kg)  01/28/21 290 lb (131.5 kg)     LABS  reviewed today   Lab Results  Component Value Date   WBC 4.8 01/28/2021   HGB 12.3 01/28/2021   HCT 38.9 01/28/2021   MCV 84.6 01/28/2021   PLT 449 (H) 01/28/2021   Lab Results  Component Value Date   CREATININE 1.04 (H) 01/28/2021   BUN 10 01/28/2021   NA 137 01/28/2021   K 3.2 (L) 01/28/2021   CL 102 01/28/2021   CO2 26 01/28/2021   Lab Results  Component Value Date   ALT 21 01/28/2021   AST 30 01/28/2021   ALKPHOS 105 01/28/2021   BILITOT 0.6 01/28/2021   No results found for: CHOL, HDL, LDLCALC, LDLDIRECT, TRIG, CHOLHDL  Lab Results  Component Value Date   HGBA1C 6.1 (H) 07/24/2018   No results found for: TSH   STUDIES/PROCEDURES reviewed today   CT coronary  score 02/24/21 IMPRESSION: 1. Normal coronary calcium score of 0. Patient is low risk for coronary events. 2. Normal coronary origin with right dominance. 3. No evidence of CAD. 4. CAD-RADS 0. Consider non-atherosclerotic causes of chest pain.  Echo 03/03/21 1. Left ventricular ejection fraction, by estimation, is 60 to 65%. The  left ventricle has normal function. The left ventricle has no regional  wall motion abnormalities. Left ventricular diastolic parameters are  consistent with Grade II diastolic  dysfunction (pseudonormalization).  2. Right ventricular systolic function is normal. The right ventricular  size is normal. There is normal pulmonary artery systolic pressure. The  estimated right ventricular systolic pressure is 32.6 mmHg.  3. Left atrial size was mildly dilated.   Assessment & Plan    Chest pain, atypical -- Atypical chest pain without clear triggers, similar to her previously described chest pain at her 02/04/2021 visit.  EKG without acute ST/T changes.  Coronary CTA with coronary artery calcium score of 0 and no evidence of CAD.  Echo with EF 60 to 65%, NR WMA, and no significant valvular disease.  She has been referred to GI, given recent CT with esophageal thickening.  Encouraged her to follow-up with GI as scheduled.  We  will start Imdur 30 mg daily today for additional CP relief, as well as BP control.  Reviewed recommendations for heart healthy diet, as well as aggressive treatment of risk factors with patient understanding.  HTN, BP goal <130/80 -- BP today suboptimal at 152/94.  Start Imdur 30 mg daily today as above.  In addition, we will start valsartan 40 mg daily to be escalated as tolerated.  BMET today and at RTC.  At RTC, consider escalation of valsartan and addition of low-dose carvedilol for additional antianginal effect and BP control.  Continue Lasix 40 mg twice daily.  Recommend home BP checks/weight checks at home to monitor volume status.   Continue to recommend low-salt diet and monitoring of fluids.  Diet and exercise encouraged.  Given suspicion for sleep apnea, watch pat also provided after scoring high on her STOP-BANG assessment. BP guidelines for measurement at home provided in AVS.  Grade 2 diastolic dysfunction -- Reports stable shortness of breath and slightly improved lower extremity edema.  Most recent echo as above with grade 2 diastolic dysfunction, RVSP 32.6 mmHg with RAP estimated at 3 mmHg, and mild LAE.  Encouraged control of blood pressure and treatment of possible sleep apnea, if needed.  Weight is down 13 pounds from her previous clinic visit. Suspect that lower extremity edema is mostly 2/2 venous insufficiency/lymphedema.  She previously owned lymphedema pumps and requests referral to lymphedema clinic today, which will be provided at checkout.  Watch Pat provided, given her symptoms as above and most recent echo findings.  Start Imdur and valsartan for BP control as above.  Continue Lasix 40 mg twice daily.  At RTC, consider addition of Coreg.    Lymphedema/venous insufficiency -- Previously owned lymphedema pumps.  Requests referral to lymphedema clinic, provided today.  Information provided regarding venous insufficiency/lymphedema and AVS as well.  Unfortunately, she does not tolerate compression stockings as in HPI above.  Leg elevation discussed with patient understanding.  Sleep disordered breathing -- Reports poor sleep with episodes of waking while coughing at night.  She is aware that she snores at night.  She is frequently fatigued and does not feel well rested in the morning.  Given her BMI, suspect at least some element of OSA.  In addition, echo showed mildly dilated left atrium and elevated RVSP as above.  STOP-BANG assessment performed with patient scoring high risk for OSA.  Watch pat provided for patient today.  Discussed that untreated sleep apnea can contribute to elevated blood pressure and weight  gain with patient understanding.  She has reportedly been told that she may have sleep apnea in the past.  Morbid obesity -- BMI 49.92 with encouragement for weight loss through diet and exercise.  Weight loss will also help improve any obstructive sleep apnea, and it is recommended for both BP and glycemic control, as well as overall health. Diet changes provided in AVS.  Medication changes: Start Imdur 30 mg daily.  Start valsartan 40 mg daily. Labs ordered: BMET today, BMET 2-3 weeks Studies / Imaging ordered: Watch Pat Referral provided: Lymphedema clinic Future considerations: Escalation of valsartan/Imdur.  Start Coreg. Disposition: RTC 2 to 3 weeks.  *Please be aware that the above documentation was completed voice recognition software and may contain dictation errors.       Lennon Alstrom, PA-C 03/11/2021

## 2021-03-14 ENCOUNTER — Other Ambulatory Visit
Admission: RE | Admit: 2021-03-14 | Discharge: 2021-03-14 | Disposition: A | Discharge status: Home / Self Care | Payer: Medicare HMO | Attending: Physician Assistant | Admitting: Physician Assistant

## 2021-03-14 DIAGNOSIS — I10 Essential (primary) hypertension: Secondary | ICD-10-CM | POA: Insufficient documentation

## 2021-03-14 LAB — BASIC METABOLIC PANEL
Anion gap: 9 (ref 5–15)
BUN: 16 mg/dL (ref 8–23)
CO2: 26 mmol/L (ref 22–32)
Calcium: 10 mg/dL (ref 8.9–10.3)
Chloride: 104 mmol/L (ref 98–111)
Creatinine, Ser: 1.06 mg/dL — ABNORMAL HIGH (ref 0.44–1.00)
GFR, Estimated: 58 mL/min — ABNORMAL LOW (ref >60)
Glucose, Bld: 97 mg/dL (ref 70–99)
Potassium: 3.9 mmol/L (ref 3.5–5.1)
Sodium: 139 mmol/L (ref 135–145)

## 2021-03-30 NOTE — Progress Notes (Signed)
Office Visit    Patient Name: Kristine Rangel Date of Encounter: 04/01/2021  PCP:  Kristine Sato, MD   [facility]  Cardiologist:  Kristine Kendall, MD  Advanced Practice Provider:  No care team member to display Electrophysiologist:  None   Chief Complaint    Chief Complaint  Patient presents with  . Follow-up    3 Week follow up. Medications verbally reviewed with patient.     65 y.o. female with history of hypertension, hyperlipidemia, DM2, cerebral aneurysm s/p coiling, COPD, HFpEF, and here today for 3-week follow-up.  Past Medical History    Past Medical History:  Diagnosis Date  . Anxiety   . Arthritis   . Asthma   . Bladder leak   . Cerebral aneurysm    history-has coils  . CHF (congestive heart failure) (HCC)   . Chronic kidney disease   . COPD (chronic obstructive pulmonary disease) (HCC)   . Depression   . Diabetes mellitus without complication (HCC)   . Dyspnea   . GERD (gastroesophageal reflux disease)   . Glaucoma   . Headache   . History of kidney stones   . Hyperlipidemia   . Hypertension   . MI (myocardial infarction) (HCC)    unsure when  . Neuropathy   . Rotator cuff injury   . Seasonal allergies   . Stroke [facility])    TIA's   Past Surgical History:  Procedure Laterality Date  . BRAIN SURGERY Right 2010   anuerysm repair  . CARPAL TUNNEL RELEASE Right 06/12/2017   Procedure: CARPAL TUNNEL RELEASE;  Surgeon: Kristine Fairly, MD;  Location: [facility] ORS;  Service: Orthopedics;  Laterality: Right;  . CHOLECYSTECTOMY    . JOINT REPLACEMENT Bilateral    TOTAL KNEE REPLACEMENT  . SHOULDER ARTHROSCOPY WITH OPEN ROTATOR CUFF REPAIR Left 11/23/2016   Procedure: SHOULDER ARTHROSCOPY WITH OPEN ROTATOR CUFF REPAIR, ARTHROSCOPIC SUBACROMIAL DECOMPRESSION, DISTAL CLAVICLE EXCISION;  Surgeon: Kristine Fairly, MD;  Location: [facility] ORS;  Service: Orthopedics;  Laterality: Left;  . SHOULDER ARTHROSCOPY WITH OPEN ROTATOR CUFF REPAIR  Right 07/30/2018   Procedure: SHOULDER ARTHROSCOPY WITH OPEN ROTATOR CUFF REPAIR,SUBACROMINAL DECOMPRESSION AND DISTAL CLAVICLE EXCISION;  Surgeon: Kristine Fairly, MD;  Location: [facility] ORS;  Service: Orthopedics;  Laterality: Right;  . TOTAL KNEE REVISION Right 05/05/2020   Procedure: right total knee arthroplasty revision;  Surgeon: Kristine Herrlich, MD;  Location: [facility] ORS;  Service: Orthopedics;  Laterality: Right;    Allergies  Allergies  Allergen Reactions  . Bee Venom Anaphylaxis  . Penicillins Hives, Swelling and Other (See Comments)    Has patient had a PCN reaction causing immediate rash, facial/tongue/throat swelling, SOB or lightheadedness with hypotension: Yes Has patient had a PCN reaction causing severe rash involving mucus membranes or skin necrosis: No Has patient had a PCN reaction that required hospitalization No Has patient had a PCN reaction occurring within the last 10 years: No If all of the above answers are "NO", then may proceed with Cephalosporin use.    History of Present Illness    Kristine Rangel is a 65 y.o. female with PMH as above.  She was previously evaluated by Dr. Juliann Rangel of [facility] clinic cardiology with most recent visit 02/2016.  At that time, notes referenced angina and shortness of breath with recommendation for sleep study.  MPI from 05/2015 normal.  Echo showed normal LVEF with mild LVH and moderate MR, as well as mild pulmonary hypertension.  [facility] with [facility], complaining of worsening right leg pain.  She was referred to the ED for further evaluation, where she reported a 2-day history of chest pain.  CTA of the chest negative for PE.  Right lower extremity ultrasound negative for DVT.  High-sensitivity troponin negative x2.    She was initially seen in the office 02/04/2021 by Dr. Okey Rangel for chest pain at the request of Kristine Rangel.  She reported intermittent chest pain, radiating to the  stomach and back, and occurring over the last 1 to 2 months.  She reported 3-4 episodes in the last month, usually lasting 10 to 15 minutes.  The pain occurred randomly and was not associated with activity or eating.  She had tried gas medications without relief.  She was also on a PPI.  She had associated dizziness, as well as intermittent shortness of breath.  No palpitations.  She reported chronic bilateral lower extremity edema with right greater than that of left.  She had stable 3 pillow orthopnea.  She reported her recent chest discomfort is similar to that of 2017 when evaluated by Dr. Juliann Rangel.  It was felt that her chest pain was atypical, as it was not exertional and occurred randomly.  GI etiology was considered, given recent CTA with esophageal thickening.  Coronary CTA was obtained with coronary artery calcium score of 0.  Echo was obtained with results as below and EF 60 to 65%, G2 DD, RVSP 32.6 mmHg with RAP estimated 3 mmHg, mild LAE,.  Exam was limited by body habitus but lower extremity edema thought to reflect venous insufficiency and/or lymphedema.  She was continued on Lasix 40 mg twice daily.  Diet and exercise recommended.  Seen 03/11/2021 and noted right lower extremity edema greater than left with recent right knee replacement.  She had used lymphedema pumps in the past but had lost them.  Referral provided to lymphedema clinic at that time. She used to wear thigh-high compression stockings, but she notes that these would cut into her legs, so she had to have them cut off her.  Recommendation was for wrapping her lower extremities and Ace bandages or similar findings via [facility]/zip up compression stockings.  She reported stable 3 pillow orthopnea.  At night, she snores and wakes coughing from sleep frequently.  She reports daytime fatigue and has not yet done a sleep study.  She notes intermittent chest pain and shortness of breath without clear triggers and not necessarily related to  exertion.  She plan to get an appointment with GI for esophageal thickening seen on CT. She was cutting salt out of her diet and did note home BP readings have been elevated.  She was trying to walk daily and eat 3 small meals per day.  She was working on joining a [employer] to become more active. STOP-BANG assessment performed and scoring 7 at high risk of OSA.  Both echo and coronary CTA reviewed with patient.  She was started on Imdur 30 mg daily and valsartan 40 mg daily.  BMET was obtained in clinic with recommendation for repeat BMET in 2 to 3 weeks.  Watch pat was ordered and referral provided to lymphedema clinic.  Today, 04/01/2021, she returns to clinic and notes that she is doing well on the Imdur.  She has not necessarily noticed a difference with starting this medication.  She still reports changed chest discomfort at times, though on further questioning, she thinks CP has improved in  severity though unchanged in frequency.   She reports some CP occurred yesterday evening when laying down and was noted after waking as well and started when moving around.  CP described as left-sided and radiating into her back.  This lasted for 2 to 5 minutes.  She was sweaty.  She reports ongoing shortness of breath, dyspnea, and cough.  She is monitoring her blood pressure at home with a digital cuff from [facility].  BP today improved from previous and since starting valsartan.  She has reduced to sleeping on 2 pillows.  She has not yet had her lymphedema visit with confirmation obtained that this order has been placed.  She reports ongoing sleep disordered breathing.  GI has not yet called for her follow-up visit as above.  She has not yet joined [employer] but still walking every day.  No signs or symptoms of bleeding.   Previous visit STOP-BANG Rex assessment  S-snore  Have you been told that you snore?   Y  T-tired Are you often tired, fatigued, sleepy during the day?   Y   O-obstruction Do you stop breathing, choke, or gasp during sleep?   Y  P-pressure Do you have or are you being treated for high blood pressure?  Y  B- BMI Is your BMI greater than 35 kg/m?  Y  A- age Are you 50 years or older?  Y  N-neck Do you have a neck circumference greater than 16 inches?  Y  G-gender Are you female?   N  Total  7   Risk Score <3 Low risk of OSA ?3 High risk of OSA  Home Medications    Current Outpatient Medications on File Prior to Visit  Medication Sig Dispense Refill  . aspirin EC 81 MG tablet Take 81 mg by mouth daily. Swallow whole.    Marland Kitchen atorvastatin (LIPITOR) 40 MG tablet Take 40 mg by mouth at bedtime.    . brimonidine-timolol (COMBIGAN) 0.2-0.5 % ophthalmic solution Place 1 drop into both eyes 2 (two) times daily.    . cetirizine (ZYRTEC) 10 MG tablet Take 10 mg by mouth daily.    . DULoxetine (CYMBALTA) 60 MG capsule Take 60 mg by mouth at bedtime.     Marland Kitchen EPINEPHrine 0.3 mg/0.3 mL IJ SOAJ injection Inject 0.3 mg into the skin as needed for anaphylaxis.     Marland Kitchen esomeprazole (NEXIUM) 40 MG capsule Take 40 mg by mouth daily.    . fluticasone (FLONASE) 50 MCG/ACT nasal spray Place 1 spray into both nostrils daily.     . furosemide (LASIX) 40 MG tablet Take 40 mg by mouth 2 (two) times daily.     Marland Kitchen gabapentin (NEURONTIN) 300 MG capsule Take 300 mg by mouth 3 (three) times daily.    . isosorbide mononitrate (IMDUR) 30 MG 24 hr tablet Take 1 tablet (30 mg total) by mouth daily. 30 tablet 1  . latanoprost (XALATAN) 0.005 % ophthalmic solution Place 1 drop into both eyes at bedtime.    . meloxicam (MOBIC) 7.5 MG tablet Take 7.5 mg by mouth 2 (two) times daily.    Marland Kitchen MYRBETRIQ 25 MG TB24 tablet TAKE 1 TABLET BY MOUTH EVERY DAY for bladder    . SYMBICORT 160-4.5 MCG/ACT inhaler Inhale 2 puffs into the lungs 2 (two) times daily.    Marland Kitchen tiZANidine (ZANAFLEX) 4 MG tablet Take 2-4 mg by mouth every 6 (six) hours as needed for muscle spasms.     . traMADol (ULTRAM) 50  MG tablet Take 50 mg by mouth every 6 (six) hours as needed.    . valsartan (DIOVAN) 40 MG tablet Take 1 tablet (40 mg total) by mouth daily. 30 tablet 1  . zolpidem (AMBIEN) 10 MG tablet Take 10 mg by mouth at bedtime as needed.     No current facility-administered medications on file prior to visit.    Review of Systems    She reports CP as described above, dyspnea, shortness of breath, fatigue, racing heart rate. She reports improvement from 3 pillow orthopnea to 2 pillow orthopnea.  She continues to sometimes wake up choking and snore. She reports daytime fatigue.  No palpitations,n, v, syncope, weight gain, or early satiety.  All other systems reviewed and are otherwise negative except as noted above.  Physical Exam    VS:  BP 140/70 (BP Location: Right Arm, Patient Position: Sitting, Cuff Size: Normal)   Pulse 96   Ht  (1.651 m)   Wt (!) 301 lb (136.5 kg)   SpO2 95%   BMI 50.09 kg/m  , BMI Body mass index is 50.09 kg/m. GEN: Obese female, in no acute distress. HEENT: normal. Neck: Supple, no JVD, carotid bruits, or masses. Cardiac: RRR, no murmurs, rubs, or gallops. No clubbing, cyanosis. bilateral mild nonpitting edema.  Radials/DP/PT 2+ and equal bilaterally.  Respiratory:  Respirations regular and unlabored, clear to auscultation bilaterally. GI: Soft, nontender, nondistended, BS + x 4. MS: no deformity or atrophy.  Bilateral lower extremities wrapped with Velcro black sports wrap.  Nonpitting edema present in both lower extremities. Skin: warm and dry, no rash. Neuro:  Strength and sensation are intact. Psych: Normal affect.  Accessory Clinical Findings    ECG personally reviewed by me today -NSR,64 bpm, 152 ms, QRS 76 ms, repolarization changes- no acute changes.  VITALS Reviewed today   Temp Readings from Last 3 Encounters:  01/28/21 98.3 F (36.8 C) (Oral)  05/07/20 97.9 F (36.6 C) (Oral)  12/04/18 97.6 F (36.4 C) (Oral)   BP Readings from Last 3  Encounters:  04/01/21 140/70  03/11/21 (!) 152/94  02/24/21 125/79   Pulse Readings from Last 3 Encounters:  04/01/21 96  03/11/21 77  02/24/21 (!) 58    Wt Readings from Last 3 Encounters:  04/01/21 (!) 301 lb (136.5 kg)  03/11/21 300 lb (136.1 kg)  02/04/21 (!) 313 lb (142 kg)     LABS  reviewed today   Lab Results  Component Value Date   WBC 4.8 01/28/2021   HGB 12.3 01/28/2021   HCT 38.9 01/28/2021   MCV 84.6 01/28/2021   PLT 449 (H) 01/28/2021   Lab Results  Component Value Date   CREATININE 1.06 (H) 03/14/2021   BUN 16 03/14/2021   NA 139 03/14/2021   K 3.9 03/14/2021   CL 104 03/14/2021   CO2 26 03/14/2021   Lab Results  Component Value Date   ALT 21 01/28/2021   AST 30 01/28/2021   ALKPHOS 105 01/28/2021   BILITOT 0.6 01/28/2021   No results found for: CHOL, HDL, LDLCALC, LDLDIRECT, TRIG, CHOLHDL  Lab Results  Component Value Date   HGBA1C 6.1 (H) 07/24/2018   No results found for: TSH   STUDIES/PROCEDURES reviewed today   CT coronary score 02/24/21 IMPRESSION: 1. Normal coronary calcium score of 0. Patient is low risk for coronary events. 2. Normal coronary origin with right dominance. 3. No evidence of CAD. 4. CAD-RADS 0. Consider non-atherosclerotic causes of chest pain.  Echo  03/03/21 1. Left ventricular ejection fraction, by estimation, is 60 to 65%. The  left ventricle has normal function. The left ventricle has no regional  wall motion abnormalities. Left ventricular diastolic parameters are  consistent with Grade II diastolic  dysfunction (pseudonormalization).  2. Right ventricular systolic function is normal. The right ventricular  size is normal. There is normal pulmonary artery systolic pressure. The  estimated right ventricular systolic pressure is 32.6 mmHg.  3. Left atrial size was mildly dilated.   Assessment & Plan    Chest pain, atypical -- Going atypical chest pain, though it may have improved in severity since  addition of Imdur.  EKG without acute ST/T changes.  Coronary CTA with CAC score 0.  Echo with EF 60 to 65%, NRWMA, and no significant valvular disease.  She has been referred to GI, given recent CT with esophageal thickening.  Encouraged her to follow-up with GI as scheduled.  We will start Imdur 30 mg daily today for additional CP relief, possible relief from esophageal spasm, as well as BP control.  As needed sublingual nitro provided.  We will also provide short-term increase in diuresis as outlined below, as elevated volume status could be contributing to her CP.  Reviewed recommendations for heart healthy diet, as well as aggressive treatment of risk factors with patient understanding.  HTN, BP goal <130/80 -- BP today suboptimal but improved from her previous clinic visit with initiation of Imdur and valsartan.    Will increase to Imdur 60 mg daily.  Short-term increase in diuresis recommended today as outlined below.  Discussed possible increase in valsartan versus addition of low-dose carvedilol.   Recommend home BP checks/weight checks at home to monitor volume status.  Continue to recommend low-salt diet and monitoring of fluids.  Diet and exercise encouraged.  Given suspicion for sleep apnea, Watch Pat provided at previous clinic visit.   Grade 2 diastolic dysfunction -- Reports shortness of breath, dyspnea, and unchanged cough.  Most recent echo as above with grade 2 diastolic dysfunction, RVSP 32.6 mmHg with RAP estimated at 3 mmHg, and mild LAE.  Encouraged control of blood pressure and treatment of possible sleep apnea, if needed.  Weight stable from previous clinic visit. Suspect that lower extremity edema is mostly 2/2 venous insufficiency/lymphedema.  However, given her most recent episode of of chest discomfort with ongoing dyspnea and shortness of breath when laying flat, will increase to Lasix 80 mg x 2/day for 2 days and then drop down to Lasix 40 mg x 2/day.  She previously owned  lymphedema pumps and referral to lymphedema clinic provided at her last office visit. Watch Pat provided / pending results.  Will increase to Imdur 60 mg daily.  Further recommendations regarding adjustment of current medications/addition of Coreg pending labs.    Lymphedema/venous insufficiency -- Previously owned lymphedema pumps.  At her previous clinic visit, information provided regarding venous insufficiency/lymphedema to patient during visit and placed on AVS as well.  Continue to recommend leg wraps.  Leg elevation discussed with patient understanding.  Sleep disordered breathing -- Watch Pat study started at her last office visit and still pending results.  Morbid obesity -- BMI 50.09 with encouragement for weight loss through diet and exercise.  Weight loss will also help improve obstructive sleep apnea if present, and it is recommended for both BP and glycemic control, as well as overall health. Diet changes provided in AVS.  Medication changes: Increase to Imdur 60 mg daily.  2 days of increased  dose diuresis with Lasix 80 mg x2 daily then drop back down to Lasix 40 mg x2 daily thereafter.  As needed sublingual nitro provided Labs ordered: BMET today Studies / Imaging ordered: Watch Pat (ordered at last visit, pending results) Referral provided: Lymphedema clinic (pending contact with patient) Future considerations: Escalation of valsartan / Lasix.  Start Coreg. Disposition: RTC 1 month.  *Please be aware that the above documentation was completed voice recognition software and may contain dictation errors.       Lennon Alstrom, PA-C 04/01/2021

## 2021-04-01 ENCOUNTER — Ambulatory Visit (INDEPENDENT_AMBULATORY_CARE_PROVIDER_SITE_OTHER): Payer: Medicare HMO | Admitting: Physician Assistant

## 2021-04-01 ENCOUNTER — Other Ambulatory Visit: Payer: Self-pay

## 2021-04-01 ENCOUNTER — Encounter: Payer: Self-pay | Admitting: Physician Assistant

## 2021-04-01 ENCOUNTER — Other Ambulatory Visit: Payer: Self-pay | Admitting: *Deleted

## 2021-04-01 ENCOUNTER — Telehealth: Payer: Self-pay | Admitting: Physician Assistant

## 2021-04-01 VITALS — BP 140/70 | HR 96 | Ht 65.0 in | Wt 301.0 lb

## 2021-04-01 DIAGNOSIS — R079 Chest pain, unspecified: Secondary | ICD-10-CM

## 2021-04-01 DIAGNOSIS — R0789 Other chest pain: Secondary | ICD-10-CM | POA: Diagnosis not present

## 2021-04-01 DIAGNOSIS — G4730 Sleep apnea, unspecified: Secondary | ICD-10-CM

## 2021-04-01 DIAGNOSIS — I5189 Other ill-defined heart diseases: Secondary | ICD-10-CM

## 2021-04-01 DIAGNOSIS — Z79899 Other long term (current) drug therapy: Secondary | ICD-10-CM | POA: Diagnosis not present

## 2021-04-01 DIAGNOSIS — I890 Lymphedema, not elsewhere classified: Secondary | ICD-10-CM

## 2021-04-01 DIAGNOSIS — Z6841 Body Mass Index (BMI) 40.0 and over, adult: Secondary | ICD-10-CM

## 2021-04-01 DIAGNOSIS — I10 Essential (primary) hypertension: Secondary | ICD-10-CM

## 2021-04-01 MED ORDER — NITROGLYCERIN 0.4 MG SL SUBL: 0.4000 mg | SUBLINGUAL_TABLET | SUBLINGUAL | 3 refills | Status: DC | PRN

## 2021-04-01 MED ORDER — ISOSORBIDE MONONITRATE ER 60 MG PO TB24: 60.0000 mg | ORAL_TABLET | Freq: Every day | ORAL | 5 refills | Status: DC

## 2021-04-01 NOTE — Patient Instructions (Signed)
Medication Instructions:  Your physician has recommended you make the following change in your medication:   INCREASE Isosorbide (Imdur) to 60mg  daily  INCREASE Lasix to 80mg  TWICE daily x 2 days, THEN resume Lasix 40mg  TWICE daily  START Nitroglycerin sublingual 0.4mg  AS NEEDED (see below instructions)  For as needed Nitroglycerin, if you develop chest pain: . Sit and rest 5 minutes. If chest pain does not resolve place 1 nitroglycerin under your tongue and wait 5 minutes. . If chest pain does not resolve, place a 2nd nitroglycerin under your tongue and wait 5 more minutes. . If chest pain does not resolve, place a 3rd nitroglycerin under your tongue and seek emergency services.    *If you need a refill on your cardiac medications before your next appointment, please call your pharmacy*   Lab Work:  Your physician recommends that you have lab work TODAY: BMET  If you have labs (blood work) drawn today and your tests are completely normal, you will receive your results only by: MyChart Message (if you have MyChart) OR . A paper copy in the mail If you have any lab test that is abnormal or we need to change your treatment, we will call you to review the results.   Testing/Procedures:  None ordered   Follow-Up: At [facility], you and your health needs are our priority.  As part of our continuing mission to provide you with exceptional heart care, we have created designated Provider Care Teams.  These Care Teams include your primary Cardiologist (physician) and Advanced Practice Providers (APPs -  Physician Assistants and Nurse Practitioners) who all work together to provide you with the care you need, when you need it.  We recommend signing up for the patient portal called "MyChart".  Sign up information is provided on this After Visit Summary.  MyChart is used to connect with patients for Virtual Visits (Telemedicine).  Patients are able to view lab/test results, encounter  notes, upcoming appointments, etc.  Non-urgent messages can be sent to your provider as well.   To learn more about what you can do with MyChart, go to .    Your next appointment:   1 month(s)  The format for your next appointment:   In Person  Provider:   You may see Marland Kitchen, MD or one of the following Advanced Practice Providers on your designated Care Team:     [facility], [email or web address]

## 2021-04-01 NOTE — Telephone Encounter (Signed)
Referral was placed for OT services for lymphedema. Following up on this and I see that it is ready for scheduling. Called over to [facility] and they close after lunch on Fridays. Will call again on Monday to check on the status of making appointment for patient.

## 2021-04-03 ENCOUNTER — Encounter: Payer: Self-pay | Admitting: Physician Assistant

## 2021-04-04 ENCOUNTER — Telehealth: Payer: Self-pay

## 2021-04-04 ENCOUNTER — Other Ambulatory Visit
Admission: RE | Admit: 2021-04-04 | Discharge: 2021-04-04 | Disposition: A | Discharge status: Home / Self Care | Payer: Medicare HMO | Source: Ambulatory Visit | Attending: Physician Assistant | Admitting: Physician Assistant

## 2021-04-04 DIAGNOSIS — I10 Essential (primary) hypertension: Secondary | ICD-10-CM | POA: Diagnosis present

## 2021-04-04 DIAGNOSIS — R0789 Other chest pain: Secondary | ICD-10-CM | POA: Diagnosis present

## 2021-04-04 LAB — BASIC METABOLIC PANEL
Anion gap: 12 (ref 5–15)
BUN: 17 mg/dL (ref 8–23)
CO2: 26 mmol/L (ref 22–32)
Calcium: 9.7 mg/dL (ref 8.9–10.3)
Chloride: 103 mmol/L (ref 98–111)
Creatinine, Ser: 1.27 mg/dL — ABNORMAL HIGH (ref 0.44–1.00)
GFR, Estimated: 46 mL/min — ABNORMAL LOW (ref >60)
Glucose, Bld: 96 mg/dL (ref 70–99)
Potassium: 3.3 mmol/L — ABNORMAL LOW (ref 3.5–5.1)
Sodium: 141 mmol/L (ref 135–145)

## 2021-04-04 NOTE — Telephone Encounter (Signed)
Per [facility] Prior authorization for [id number] is not required. ITG#[id number]. Message to Rinaldo Cloud A to notify patient of insurance determination.

## 2021-04-04 NOTE — Telephone Encounter (Signed)
Spoke with Kristine Rangel over at the [facility] Therapy department. They have her referral and will schedule once they have an appointment opening. She states that they are full through the end of July. Will update provider and nurse that she is on the list and they will reach out once they are able to schedule.

## 2021-04-04 NOTE — Telephone Encounter (Signed)
Correction: Message to [employer].

## 2021-04-04 NOTE — Telephone Encounter (Signed)
Per Medicaid if theyand are secondary payer and patiet has a primary, no preauthorization is required.  Ref# [id number].  Aundra Millet S notified to contact patient.

## 2021-04-06 ENCOUNTER — Telehealth: Payer: Self-pay

## 2021-04-06 DIAGNOSIS — M7989 Other specified soft tissue disorders: Secondary | ICD-10-CM

## 2021-04-06 MED ORDER — POTASSIUM CHLORIDE CRYS ER 20 MEQ PO TBCR: EXTENDED_RELEASE_TABLET | ORAL | 0 refills | Status: DC

## 2021-04-06 NOTE — Telephone Encounter (Signed)
-----   Message from Lennon Alstrom, PA-C sent at 04/05/2021  1:14 PM EDT ----- K low and Cr bumped --Please ensure she is not having any emesis or diarrhea, given her history of GI issues, and as she has been on lasix for some time now with only 2 days increased diuresis recently.  --She should hold her valsartan and lasix for 2 days. --KCL tab x2 for 2 days.  --Recheck BMET by the end of the week (Friday) to ensure she does not need a daily KCL supplement going forward on her lasix and that her Cr improved. If unable to get labs this Friday, please let me know.

## 2021-04-06 NOTE — Telephone Encounter (Signed)
Called and spoke with patient. Gave her the instructions as shown below. Patient stated that she wrote them down, understood them, and agreed with plan.

## 2021-04-07 ENCOUNTER — Encounter (INDEPENDENT_AMBULATORY_CARE_PROVIDER_SITE_OTHER): Payer: Medicare HMO | Admitting: Cardiology

## 2021-04-07 DIAGNOSIS — G4733 Obstructive sleep apnea (adult) (pediatric): Secondary | ICD-10-CM

## 2021-04-07 NOTE — Telephone Encounter (Signed)
Spoke to pt. PIN number given to pt #[id number] and reviewed instructions to begin home WatchPat sleep study.  Pt voiced understanding.

## 2021-04-08 ENCOUNTER — Emergency Department: Payer: Medicare HMO

## 2021-04-08 ENCOUNTER — Other Ambulatory Visit: Payer: Self-pay

## 2021-04-08 ENCOUNTER — Emergency Department
Admission: EM | Admit: 2021-04-08 | Discharge: 2021-04-08 | Disposition: A | Discharge status: Home / Self Care | Payer: Medicare HMO | Attending: Emergency Medicine | Admitting: Emergency Medicine

## 2021-04-08 ENCOUNTER — Encounter: Payer: Self-pay | Admitting: Emergency Medicine

## 2021-04-08 DIAGNOSIS — Z7982 Long term (current) use of aspirin: Secondary | ICD-10-CM | POA: Diagnosis not present

## 2021-04-08 DIAGNOSIS — I110 Hypertensive heart disease with heart failure: Secondary | ICD-10-CM | POA: Insufficient documentation

## 2021-04-08 DIAGNOSIS — Z87891 Personal history of nicotine dependence: Secondary | ICD-10-CM | POA: Diagnosis not present

## 2021-04-08 DIAGNOSIS — R079 Chest pain, unspecified: Secondary | ICD-10-CM | POA: Insufficient documentation

## 2021-04-08 DIAGNOSIS — J449 Chronic obstructive pulmonary disease, unspecified: Secondary | ICD-10-CM | POA: Diagnosis not present

## 2021-04-08 DIAGNOSIS — J45909 Unspecified asthma, uncomplicated: Secondary | ICD-10-CM | POA: Diagnosis not present

## 2021-04-08 DIAGNOSIS — Z96653 Presence of artificial knee joint, bilateral: Secondary | ICD-10-CM | POA: Insufficient documentation

## 2021-04-08 DIAGNOSIS — M5412 Radiculopathy, cervical region: Secondary | ICD-10-CM | POA: Insufficient documentation

## 2021-04-08 DIAGNOSIS — Z7951 Long term (current) use of inhaled steroids: Secondary | ICD-10-CM | POA: Insufficient documentation

## 2021-04-08 DIAGNOSIS — Z79899 Other long term (current) drug therapy: Secondary | ICD-10-CM | POA: Diagnosis not present

## 2021-04-08 DIAGNOSIS — I509 Heart failure, unspecified: Secondary | ICD-10-CM | POA: Insufficient documentation

## 2021-04-08 DIAGNOSIS — M542 Cervicalgia: Secondary | ICD-10-CM | POA: Diagnosis present

## 2021-04-08 DIAGNOSIS — E119 Type 2 diabetes mellitus without complications: Secondary | ICD-10-CM | POA: Diagnosis not present

## 2021-04-08 LAB — BASIC METABOLIC PANEL
Anion gap: 7 (ref 5–15)
BUN: 14 mg/dL (ref 8–23)
CO2: 28 mmol/L (ref 22–32)
Calcium: 9.9 mg/dL (ref 8.9–10.3)
Chloride: 104 mmol/L (ref 98–111)
Creatinine, Ser: 1.02 mg/dL — ABNORMAL HIGH (ref 0.44–1.00)
GFR, Estimated: >60 mL/min (ref >60)
Glucose, Bld: 106 mg/dL — ABNORMAL HIGH (ref 70–99)
Potassium: 4.1 mmol/L (ref 3.5–5.1)
Sodium: 139 mmol/L (ref 135–145)

## 2021-04-08 LAB — TROPONIN I (HIGH SENSITIVITY)
Troponin I (High Sensitivity): 6 ng/L (ref <18)
Troponin I (High Sensitivity): 5 ng/L (ref <18)

## 2021-04-08 LAB — CBC
HCT: 34.1 % — ABNORMAL LOW (ref 36.0–46.0)
Hemoglobin: 11.1 g/dL — ABNORMAL LOW (ref 12.0–15.0)
MCH: 27.1 pg (ref 26.0–34.0)
MCHC: 32.6 g/dL (ref 30.0–36.0)
MCV: 83.4 fL (ref 80.0–100.0)
Platelets: 364 10*3/uL (ref 150–400)
RBC: 4.09 MIL/uL (ref 3.87–5.11)
RDW: 15.7 % — ABNORMAL HIGH (ref 11.5–15.5)
WBC: 4.7 10*3/uL (ref 4.0–10.5)
nRBC: 0 % (ref 0.0–0.2)

## 2021-04-08 MED ORDER — KETOROLAC TROMETHAMINE 30 MG/ML IJ SOLN: 30.0000 mg | Freq: Once | INTRAMUSCULAR | Status: DC

## 2021-04-08 MED ORDER — ORPHENADRINE CITRATE 30 MG/ML IJ SOLN
60.0000 mg | INTRAMUSCULAR | Status: DC
  Filled 2021-04-08: qty 2

## 2021-04-08 MED ORDER — KETOROLAC TROMETHAMINE 10 MG PO TABS: 10.0000 mg | ORAL_TABLET | Freq: Three times a day (TID) | ORAL | 0 refills | Status: DC

## 2021-04-08 MED ORDER — KETOROLAC TROMETHAMINE 30 MG/ML IJ SOLN
30.0000 mg | Freq: Once | INTRAMUSCULAR | Status: AC
  Administered 2021-04-08: 30 mg via INTRAVENOUS
  Filled 2021-04-08: qty 1

## 2021-04-08 MED ORDER — HYDROCODONE-ACETAMINOPHEN 5-325 MG PO TABS: 1.0000 | ORAL_TABLET | Freq: Three times a day (TID) | ORAL | 0 refills | Status: DC | PRN

## 2021-04-08 MED ORDER — LIDOCAINE 5 % EX PTCH: 1.0000 | MEDICATED_PATCH | Freq: Two times a day (BID) | CUTANEOUS | 0 refills | Status: AC | PRN

## 2021-04-08 MED ORDER — ORPHENADRINE CITRATE 30 MG/ML IJ SOLN
60.0000 mg | INTRAMUSCULAR | Status: AC
  Administered 2021-04-08: 60 mg via INTRAVENOUS
  Filled 2021-04-08: qty 2

## 2021-04-08 NOTE — Telephone Encounter (Signed)
Spoke with patient and she is not able to get out due to the weather. She will go on Tuesday to have those labs done. She was very hard to understand but she mentioned her neck hurting. Recommended she check with her primary care provider for the neck pain. She verbalized understanding with no further questions at this time.

## 2021-04-08 NOTE — Telephone Encounter (Signed)
Noted  

## 2021-04-08 NOTE — Telephone Encounter (Signed)
Patient called to reschedule labs.  [facility] not [facility] and will go on Tuesday .    Patient states she did sleep study but the chest lead came off because she was sweating.    Please call to discuss med advise below due to change in lab draw.

## 2021-04-08 NOTE — ED Provider Notes (Signed)
[facility] Emergency Department Provider Note  ____________________________________________   Event Date/Time   First MD Initiated Contact with Patient 04/08/21 1642     (approximate)  I have reviewed the triage vital signs and the nursing notes.   HISTORY  Chief Complaint Chest Pain  HPI Kristine Rangel is a 65 y.o. female with the below medical history, presents herself to the ED via EMS from home.  Patient complains of some left-sided neck pain with referral to the upper arm and shoulder.  She describes onset of the same pain yesterday, with an intense increase at about 3 AM.  At that point she was because awakening with sweats, with intense pain to this left neck with some referral across the chest and into LUE.  She took 1 nitro pill with some limited benefit.  She denies any significant cough, congestion, or shortness of breath.  She also denies any distal paresthesias, grip changes, or swelling.  Patient does give a history of DDDof the cervical and lumbar spine.  She has taken her prescribed muscle relaxant with limited benefit.     Past Medical History:  Diagnosis Date  . Anxiety   . Arthritis   . Asthma   . Bladder leak   . Cerebral aneurysm    history-has coils  . CHF (congestive heart failure) (HCC)   . Chronic kidney disease   . COPD (chronic obstructive pulmonary disease) (HCC)   . Depression   . Diabetes mellitus without complication (HCC)   . Dyspnea   . GERD (gastroesophageal reflux disease)   . Glaucoma   . Headache   . History of kidney stones   . Hyperlipidemia   . Hypertension   . MI (myocardial infarction) (HCC)    unsure when  . Neuropathy   . Rotator cuff injury   . Seasonal allergies   . Stroke [facility](HCC)    TIA's    Patient Active Problem List   Diagnosis Date Noted  . Chest pain 02/05/2021  . SOB (shortness of breath) 02/05/2021  . Leg swelling 02/05/2021  . Nonrheumatic mitral valve regurgitation 02/05/2021  .  Morbid obesity (HCC) 02/05/2021  . 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 S/P right rotator cuff repair 07/30/2018  . S/P left rotator cuff repair 11/23/2016  . Benign essential hypertension 02/05/2003    Past Surgical History:  Procedure Laterality Date  . BRAIN SURGERY Right 2010   anuerysm repair  . CARPAL TUNNEL RELEASE Right 06/12/2017   Procedure: CARPAL TUNNEL RELEASE;  Surgeon: Juanell FairlyKrasinski, Kevin, MD;  Location: [facility] ORS;  Service: Orthopedics;  Laterality: Right;  . CHOLECYSTECTOMY    . JOINT REPLACEMENT Bilateral    TOTAL KNEE REPLACEMENT  . SHOULDER ARTHROSCOPY WITH OPEN ROTATOR CUFF REPAIR Left 11/23/2016   Procedure: SHOULDER ARTHROSCOPY WITH OPEN ROTATOR CUFF REPAIR, ARTHROSCOPIC SUBACROMIAL DECOMPRESSION, DISTAL CLAVICLE EXCISION;  Surgeon: Juanell FairlyKevin Krasinski, MD;  Location: [facility] ORS;  Service: Orthopedics;  Laterality: Left;  . SHOULDER ARTHROSCOPY WITH OPEN ROTATOR CUFF REPAIR Right 07/30/2018   Procedure: SHOULDER ARTHROSCOPY WITH OPEN ROTATOR CUFF REPAIR,SUBACROMINAL DECOMPRESSION AND DISTAL CLAVICLE EXCISION;  Surgeon: Juanell FairlyKrasinski, Kevin, MD;  Location: [facility] ORS;  Service: Orthopedics;  Laterality: Right;  . TOTAL KNEE REVISION Right 05/05/2020   Procedure: right total knee arthroplasty revision;  Surgeon: Lyndle HerrlichBowers, James R, MD;  Location: [facility] ORS;  Service: Orthopedics;  Laterality: Right;    Prior to Admission medications   Medication Sig Start Date End Date Taking? Authorizing Provider  HYDROcodone-acetaminophen (  NORCO) 5-325 MG tablet Take 1 tablet by mouth 3 (three) times daily as needed. 04/08/21  Yes Kadarius Cuffe, Charlesetta Ivory, PA-C  ketorolac (TORADOL) 10 MG tablet Take 1 tablet (10 mg total) by mouth every 8 (eight) hours. 04/08/21  Yes Ceasia Elwell, Charlesetta Ivory, PA-C  lidocaine (LIDODERM) 5 % Place 1 patch onto the skin every 12 (twelve) hours as needed for up to 10 days. Remove & Discard patch after 12 hours of wear each day. 04/08/21 04/18/21 Yes Rhiannan Kievit, Charlesetta Ivory, PA-C  aspirin EC 81 MG tablet Take 81 mg by mouth daily. Swallow whole.    [provider]  atorvastatin (LIPITOR) 40 MG tablet Take 40 mg by mouth at bedtime. 03/13/16   [provider]  brimonidine-timolol (COMBIGAN) 0.2-0.5 % ophthalmic solution Place 1 drop into both eyes 2 (two) times daily.    [provider]  cetirizine (ZYRTEC) 10 MG tablet Take 10 mg by mouth daily.    [provider]  DULoxetine (CYMBALTA) 60 MG capsule Take 60 mg by mouth at bedtime.  03/13/16   [provider]  EPINEPHrine 0.3 mg/0.3 mL IJ SOAJ injection Inject 0.3 mg into the skin as needed for anaphylaxis.     [provider]  esomeprazole (NEXIUM) 40 MG capsule Take 40 mg by mouth daily. 03/17/16   [provider]  fluticasone (FLONASE) 50 MCG/ACT nasal spray Place 1 spray into both nostrils daily.     [provider]  furosemide (LASIX) 40 MG tablet Take 40 mg by mouth 2 (two) times daily.     [provider]  gabapentin (NEURONTIN) 300 MG capsule Take 300 mg by mouth 3 (three) times daily.    [provider]  isosorbide mononitrate (IMDUR) 60 MG 24 hr tablet Take 1 tablet (60 mg total) by mouth daily. 04/01/21 09/28/21  Marisue Ivan D, PA-C  latanoprost (XALATAN) 0.005 % ophthalmic solution Place 1 drop into both eyes at bedtime. 03/19/20   [provider]  meloxicam (MOBIC) 7.5 MG tablet Take 7.5 mg by mouth 2 (two) times daily. 01/17/21   [provider]  MYRBETRIQ 25 MG TB24 tablet TAKE 1 TABLET BY MOUTH EVERY DAY for bladder 02/17/21   [provider]  nitroGLYCERIN (NITROSTAT) 0.4 MG SL tablet Place 1 tablet (0.4 mg total) under the tongue every 5 (five) minutes as needed for chest pain. 04/01/21   Marisue Ivan D, PA-C  potassium chloride SA (KLOR-CON) 20 MEQ tablet Take 1 tablet by mouth twice a day for 2 days only. 04/06/21   Marisue Ivan D, PA-C  SYMBICORT 160-4.5 MCG/ACT inhaler Inhale  2 puffs into the lungs 2 (two) times daily. 03/13/16   [provider]  tiZANidine (ZANAFLEX) 4 MG tablet Take 2-4 mg by mouth every 6 (six) hours as needed for muscle spasms.  09/26/16   [provider]  traMADol (ULTRAM) 50 MG tablet Take 50 mg by mouth every 6 (six) hours as needed. 01/25/21   [provider]  valsartan (DIOVAN) 40 MG tablet Take 1 tablet (40 mg total) by mouth daily. 03/11/21 05/10/21  Marisue Ivan D, PA-C  zolpidem (AMBIEN) 10 MG tablet Take 10 mg by mouth at bedtime as needed. 01/24/21   [provider]    Allergies Bee venom and Penicillins  Family History  Problem Relation Age of Onset  . Breast cancer Maternal Aunt   . Diabetes Mother   . Heart attack Paternal Grandfather   . Stroke Paternal  Grandfather     Social History Social History   Tobacco Use  . Smoking status: Former Smoker    Quit date: 11/17/1987    Years since quitting: 33.4  . Smokeless tobacco: Current User    Types: Snuff  Vaping Use  . Vaping Use: Never used  Substance Use Topics  . Alcohol use: No  . Drug use: No    Review of Systems  Constitutional: No fever/chills Eyes: No visual changes. ENT: No sore throat. Cardiovascular: Denies chest pain. Respiratory: Denies shortness of breath. Gastrointestinal: No abdominal pain.  No nausea, no vomiting.  No diarrhea.  No constipation. Genitourinary: Negative for dysuria. Musculoskeletal: Negative for back pain.  Left neck pain as above.   Skin: Negative for rash.  Neurological: Negative for headaches, focal weakness or numbness. ____________________________________________   PHYSICAL EXAM:  VITAL SIGNS: ED Triage Vitals  Enc Vitals Group     BP 04/08/21 1526 128/64     Pulse Rate 04/08/21 1526 63     Resp 04/08/21 1526 20     Temp 04/08/21 1526 98.3 F (36.8 C)     Temp Source 04/08/21 1526 Oral     SpO2 04/08/21 1526 96 %     Weight 04/08/21 1524 300 lb (136.1 kg)     Height 04/08/21  1524 5\' 5"  (1.651 m)     Head Circumference --      Peak Flow --      Pain Score 04/08/21 1523 6     Pain Loc --      Pain Edu? --      Excl. in GC? --     Constitutional: Alert and oriented. Well appearing and in no acute distress. Eyes: Conjunctivae are normal. PERRL. EOMI. Head: Atraumatic. Nose: No congestion/rhinnorhea. Mouth/Throat: Mucous membranes are moist.  Oropharynx non-erythematous. Neck: No stridor. No cervical spine tenderness to palpation.  Tenderness to palpation along the left SCM musculature, along the upper left trapezius.  No midline tenderness, spasm, deformity, or step-off. Cardiovascular: Normal rate, regular rhythm. Grossly normal heart sounds.  Good peripheral circulation. Respiratory: Normal respiratory effort.  No retractions. Lungs CTAB. Gastrointestinal: Soft and nontender. No distention. No abdominal bruits. No CVA tenderness. Musculoskeletal: No obvious deformity or dislocation of the extremities.  Patient with normal active range of motion.  Normal composite fist distally.  Bilateral lower extremity edema, without tenderness.  No joint effusions. Neurologic:  Normal speech and language. No gross focal neurologic deficits are appreciated. No gait instability. Skin:  Skin is warm, dry and intact. No rash noted. Psychiatric: Mood and affect are normal. Speech and behavior are normal.  ____________________________________________   LABS (all labs ordered are listed, but only abnormal results are displayed)  Labs Reviewed  BASIC METABOLIC PANEL - Abnormal; Notable for the following components:      Result Value   Glucose, Bld 106 (*)    Creatinine, Ser 1.02 (*)    All other components within normal limits  CBC - Abnormal; Notable for the following components:   Hemoglobin 11.1 (*)    HCT 34.1 (*)    RDW 15.7 (*)    All other components within normal limits  TROPONIN I (HIGH SENSITIVITY)  TROPONIN I (HIGH SENSITIVITY)    ____________________________________________  EKG  Vent. rate 69 BPM PR interval 114 ms QRS duration 68 ms QT/QTcB 384/411 ms P-R-T axes 19 35 48 No STEMI ____________________________________________  RADIOLOGY I, 04/10/21 [place], personally viewed and evaluated these images (plain radiographs) as part of  my medical decision making, as well as reviewing the written report by the radiologist.  ED MD interpretation:  Agree with report  Official radiology report(s): DG Chest 2 View  Result Date: 04/08/2021 CLINICAL DATA:  Chest pain left arm, neck and chest pain. EXAM: CHEST - 2 VIEW COMPARISON:  Radiograph 01/28/2021 FINDINGS: Stable heart size and mediastinal contours with borderline cardiomegaly. Streaky subsegmental bibasilar atelectasis or scarring. No pulmonary edema. No confluent consolidation. No pneumothorax or significant pleural effusion. Surgical anchors in the left proximal humerus. IMPRESSION: Streaky subsegmental bibasilar atelectasis or scarring. No acute findings. Electronically Signed   By: Narda Rutherford M.D.   On: 04/08/2021 16:03   ____________________________________________   PROCEDURES  Procedure(s) performed (including Critical Care):  Procedures  Toradol 30 mg IVP Norflex 60 mg IVP  ____________________________________________   INITIAL IMPRESSION / ASSESSMENT AND PLAN / ED COURSE  As part of my medical decision making, I reviewed the following data within the electronic MEDICAL RECORD NUMBER Labs reviewed WNL, EKG interpreted NSR and no ST segment changes, Radiograph reviewed NAD, Notes from prior ED visits and [place] Controlled Substance Database   DDX: AMI, MSK, cervical radiculopathy  Patient with ED evaluation of sudden onset of left-sided neck pain with referral across chest into the left upper extremity.  She was evaluated for complaints in the ED.  Clinical picture is consistent with a likely cervical radiculopathy.  Troponin x2 is  negative and EKG did not reveal any acute ST changes.  Chest x-ray is also negative for any acute intrathoracic process.  Patient report significant provement of her symptoms after IV medication administration.  She will be discharged with prescriptions for ketorolac, hydrocodone, Lidoderm patches.  She will hold her daily meloxicam as discussed.  She should follow with primary provider or return to the ED if needed. ____________________________________________   FINAL CLINICAL IMPRESSION(S) / ED DIAGNOSES  Final diagnoses:  Cervical radiculopathy     ED Discharge Orders         Ordered    ketorolac (TORADOL) 10 MG tablet  Every 8 hours        04/08/21 1932    HYDROcodone-acetaminophen (NORCO) 5-325 MG tablet  3 times daily PRN        04/08/21 1949    lidocaine (LIDODERM) 5 %  Every 12 hours PRN        04/08/21 1949          *Please note:  Kristine Rangel was evaluated in Emergency Department on 04/08/2021 for the symptoms described in the history of present illness. She was evaluated in the context of the global COVID-19 pandemic, which necessitated consideration that the patient might be at risk for infection with the SARS-CoV-2 virus that causes COVID-19. Institutional protocols and algorithms that pertain to the evaluation of patients at risk for COVID-19 are in a state of rapid change based on information released by regulatory bodies including the CDC and federal and state organizations. These policies and algorithms were followed during the patient's care in the ED.  Some ED evaluations and interventions may be delayed as a result of limited staffing during and the pandemic.*   Note:  This document was prepared using Dragon voice recognition software and may include unintentional dictation errors.    Lissa Hoard, PA-C 04/08/21 2010    Shaune Pollack, MD 04/14/21 [id number]

## 2021-04-08 NOTE — ED Notes (Signed)
Pt ambulatory to restroom, steady gait. Assisted back to bed, cardiac moni

## 2021-04-08 NOTE — ED Triage Notes (Signed)
Pt reports woke up diaphoretic and has some chest pressure that radiated into her left neck and jaw

## 2021-04-08 NOTE — Discharge Instructions (Addendum)
Your exam and labs are normal and reassuring at this time.  No sign of any serious injury to your heart.  Symptoms are likely due to nerve impingement from your neck spine, due to underlying degenerative disc disease.  Take the prescription ketorolac in place of the meloxicam for the next 5 days.  You may then restart your meloxicam as directed.  Follow-up with primary provider for ongoing symptoms.  Return to the ED if needed.

## 2021-04-08 NOTE — ED Triage Notes (Signed)
Pt comes into the ED via EMS from home with c/o left arm, neck and chest pain, woke up diaphoretic at 3am and took nitro x1 with some relief  120/53 70 100%RA

## 2021-04-12 ENCOUNTER — Other Ambulatory Visit
Admission: RE | Admit: 2021-04-12 | Discharge: 2021-04-12 | Disposition: A | Discharge status: Home / Self Care | Payer: Medicare HMO | Source: Ambulatory Visit | Attending: Physician Assistant | Admitting: Physician Assistant

## 2021-04-12 DIAGNOSIS — M7989 Other specified soft tissue disorders: Secondary | ICD-10-CM | POA: Diagnosis present

## 2021-04-12 LAB — BASIC METABOLIC PANEL
Anion gap: 10 (ref 5–15)
BUN: 17 mg/dL (ref 8–23)
CO2: 25 mmol/L (ref 22–32)
Calcium: 9.8 mg/dL (ref 8.9–10.3)
Chloride: 102 mmol/L (ref 98–111)
Creatinine, Ser: 1.35 mg/dL — ABNORMAL HIGH (ref 0.44–1.00)
GFR, Estimated: 43 mL/min — ABNORMAL LOW (ref >60)
Glucose, Bld: 88 mg/dL (ref 70–99)
Potassium: 4.1 mmol/L (ref 3.5–5.1)
Sodium: 137 mmol/L (ref 135–145)

## 2021-04-14 ENCOUNTER — Telehealth: Payer: Self-pay

## 2021-04-14 ENCOUNTER — Ambulatory Visit: Payer: Medicare HMO

## 2021-04-14 DIAGNOSIS — G4730 Sleep apnea, unspecified: Secondary | ICD-10-CM

## 2021-04-14 DIAGNOSIS — R7989 Other specified abnormal findings of blood chemistry: Secondary | ICD-10-CM

## 2021-04-14 NOTE — Telephone Encounter (Signed)
Patient made aware of lab results and Marisue Ivan, PA recommendation with verbalized understanding..  Patient sts that she has been taking meloxicam for years. She did recently start ketorolac as well. Patient sts that she will d/c ketorolac.  Patient will [facility] medical mall today or tomorrow morning for repeat bmp.

## 2021-04-14 NOTE — Procedures (Signed)
    Sleep Study Report  Patient Information Name: Kristine Rangel  ID: [id number] Birth Date: November 09, 1956  Age: 65  Gender: Female BMI: 50.0 (W=300 lb, H=5' 5'') Study Date:04/07/2021 Referring Physician: Marisue Ivan, PA  TEST DESCRIPTION: Home sleep apnea testing was completed using the WatchPat, a Type 1 device, utilizing  peripheral arterial tonometry (PAT), chest movement, actigraphy, pulse oximetry, pulse rate, body position and snore.  AHI was calculated with apnea and hypopnea using valid sleep time as the denominator. RDI includes apneas,  hypopneas, and RERAs. The data acquired and the scoring of sleep and all associated events were performed in  accordance with the recommended standards and specifications as outlined in the AASM Manual for the Scoring of  Sleep and Associated Events 2.2.0 (2015).   FINDINGS:  1. Mild Obstructive Sleep Apnea with AHI 9.3/hr.  2. No Central Sleep Apnea with pAHIc 0.8/hr.  3. Oxygen desaturations as low as 88%.  4. Mild snoring was present. O2 sats were < 88% for 0.2 min.  5. Total sleep time was 6 hrs and 29 min.  6. 22.1% of total sleep time was spent in REM sleep.  7. Shortened sleep onset latency at 8 min.  8. Prolonged REM sleep onset latency at 101 min.  9. Total awakenings were 12.   DIAGNOSIS:  Mild Obstructive Sleep Apnea (G47.33)  RECOMMENDATIONS 1. Clinical correlation of these findings is necessary. The decision to treat obstructive sleep apnea (OSA) is usually  based on the presence of apnea symptoms or the presence of associated medical conditions such as Hypertension,  Congestive Heart Failure, Atrial Fibrillation or Obesity. The most common symptoms of OSA are snoring, gasping for  breath while sleeping, daytime sleepiness and fatigue.   2. Initiating apnea therapy is recommended given the presence of symptoms and/or associated conditions.   Recommend proceeding with one of the following:   a. Auto-CPAP therapy  with a pressure range of 5-20cm H2O.   b. An oral appliance (OA) that can be obtained from certain dentists with expertise in sleep medicine. These are  primarily of use in non-obese patients with mild and moderate disease.   c. An ENT consultation which may be useful to look for specific causes of obstruction and possible treatment  Options.   d. If patient is intolerant to PAP therapy, consider referral to ENT for evaluation for hypoglossal nerve stimulator.   3. Close follow-up is necessary to ensure success with CPAP or oral appliance therapy for maximum benefit .  4. A follow-up oximetry study on CPAP is recommended to assess the adequacy of therapy and determine the need  for supplemental oxygen or the potential need for Bi-level therapy. An arterial blood gas to determine the adequacy of  baseline ventilation and oxygenation should also be considered.  5. Healthy sleep recommendations include: adequate nightly sleep (normal 7-9 hrs/night), avoidance of caffeine after  noon and alcohol near bedtime, and maintaining a sleep environment that is cool, dark and quiet.  6. Weight loss for overweight patients is recommended. Even modest amounts of weight loss can significantly  improve the severity of sleep apnea.  7. Snoring recommendations include: weight loss where appropriate, side sleeping, and avoidance of alcohol before  Bed.  8. Operation of motor vehicle or dangerous equipment must be avoided when feeling drowsy, excessively sleepy, or  mentally fatigued.  Report prepared by: Signature: Armanda Magic, MD; Hinton Lovely, American Board Sleep Medicine  Electronically Signed: Apr 14, 2021

## 2021-04-14 NOTE — Telephone Encounter (Signed)
-----   Message from Lennon Alstrom, PA-C sent at 04/13/2021  1:37 PM EDT ----- Labs show --Renal function bumped from previous labs. --Repeat BMET  On review of EMR, it looks as if she has been started on ketorolac.  Meloxicam is also listed on her medication list.  Would recommend against any NSAIDs, given the long-term effect on her cardiovascular health.  In addition, if she is taking both meloxicam and ketorolac at the same time, this could certainly bump her renal function.    Could we clarify that she is not taking both of these medications at the same time?    If not, repeat BMET recommended.  Her labs on the 27th (before ketorolac was prescribed) showed improved renal function after the medication changes we made a clinic, suggesting this might be due to the changes made in the ED on the 27th,.

## 2021-04-18 ENCOUNTER — Other Ambulatory Visit
Admission: RE | Admit: 2021-04-18 | Discharge: 2021-04-18 | Disposition: A | Discharge status: Home / Self Care | Payer: Medicare HMO | Attending: Physician Assistant | Admitting: Physician Assistant

## 2021-04-18 DIAGNOSIS — R7989 Other specified abnormal findings of blood chemistry: Secondary | ICD-10-CM | POA: Diagnosis present

## 2021-04-18 LAB — BASIC METABOLIC PANEL
Anion gap: 8 (ref 5–15)
BUN: 22 mg/dL (ref 8–23)
CO2: 25 mmol/L (ref 22–32)
Calcium: 9.8 mg/dL (ref 8.9–10.3)
Chloride: 104 mmol/L (ref 98–111)
Creatinine, Ser: 1.07 mg/dL — ABNORMAL HIGH (ref 0.44–1.00)
GFR, Estimated: 57 mL/min — ABNORMAL LOW (ref >60)
Glucose, Bld: 90 mg/dL (ref 70–99)
Potassium: 5.2 mmol/L — ABNORMAL HIGH (ref 3.5–5.1)
Sodium: 137 mmol/L (ref 135–145)

## 2021-04-18 NOTE — Telephone Encounter (Signed)
Patient was unable to have labs done due to death in family and will go today to medical mall .

## 2021-04-19 ENCOUNTER — Telehealth: Payer: Self-pay

## 2021-04-19 DIAGNOSIS — E875 Hyperkalemia: Secondary | ICD-10-CM

## 2021-04-19 NOTE — Telephone Encounter (Signed)
Noted labs completed yesterday 04/18/2021 They have resulted

## 2021-04-19 NOTE — Telephone Encounter (Signed)
Unable to reach pt via home or cell phone, LDM on mobil (DPR approved). Advised of recent lab work of [employer] has reviewed  Potassium high.  Please have her repeat labs STAT.   Order for repeat BMP has been placed, advised pt to return to medical mall to have redrawn to check potassium. Suggested to call back with any questions, gave condolences regarding recent death in family as well.

## 2021-04-25 ENCOUNTER — Other Ambulatory Visit
Admission: RE | Admit: 2021-04-25 | Discharge: 2021-04-25 | Disposition: A | Discharge status: Home / Self Care | Payer: Medicare HMO | Source: Ambulatory Visit | Attending: Physician Assistant | Admitting: Physician Assistant

## 2021-04-25 DIAGNOSIS — E875 Hyperkalemia: Secondary | ICD-10-CM | POA: Diagnosis present

## 2021-04-25 LAB — BASIC METABOLIC PANEL
Anion gap: 8 (ref 5–15)
BUN: 16 mg/dL (ref 8–23)
CO2: 28 mmol/L (ref 22–32)
Calcium: 10.4 mg/dL — ABNORMAL HIGH (ref 8.9–10.3)
Chloride: 103 mmol/L (ref 98–111)
Creatinine, Ser: 1.18 mg/dL — ABNORMAL HIGH (ref 0.44–1.00)
GFR, Estimated: 51 mL/min — ABNORMAL LOW (ref >60)
Glucose, Bld: 100 mg/dL — ABNORMAL HIGH (ref 70–99)
Potassium: 3.8 mmol/L (ref 3.5–5.1)
Sodium: 139 mmol/L (ref 135–145)

## 2021-04-27 ENCOUNTER — Telehealth: Payer: Self-pay | Admitting: *Deleted

## 2021-04-27 DIAGNOSIS — G4733 Obstructive sleep apnea (adult) (pediatric): Secondary | ICD-10-CM

## 2021-04-27 NOTE — Telephone Encounter (Signed)
-----   Message from Quintella Reichert, MD sent at 04/14/2021  7:13 PM EDT ----- Please let patient know that they have sleep apnea and recommend treating with CPAP.  Please order an auto CPAP from 4-15cm H2O with heated humidity and mask of choice.  Order overnight pulse ox on CPAP.  Followup with me in 6 weeks.

## 2021-04-27 NOTE — Telephone Encounter (Signed)
The patient has been notified of the result and verbalized understanding.  All questions (if any) were answered. Kristine Rangel, CMA 04/27/2021 1:57 PM    Upon patient request DME selection is [facility] Patient understands he will be contacted by [facility] to set up his cpap. Patient understands to call if [facility] does not contact him with new setup in a timely manner. Patient understands they will be called once confirmation has been received from [facility]  that they have received their new machine to schedule 10 week follow up appointment.   [facility] notified of new cpap order  Please add to airview Patient was grateful for the call and thanked me.

## 2021-05-02 ENCOUNTER — Encounter: Payer: Self-pay | Admitting: Physician Assistant

## 2021-05-02 ENCOUNTER — Other Ambulatory Visit: Payer: Self-pay

## 2021-05-02 ENCOUNTER — Ambulatory Visit (INDEPENDENT_AMBULATORY_CARE_PROVIDER_SITE_OTHER): Payer: Medicare HMO | Admitting: Physician Assistant

## 2021-05-02 ENCOUNTER — Other Ambulatory Visit: Payer: Self-pay | Admitting: *Deleted

## 2021-05-02 VITALS — BP 122/80 | HR 80 | Ht 65.0 in | Wt 302.0 lb

## 2021-05-02 DIAGNOSIS — I10 Essential (primary) hypertension: Secondary | ICD-10-CM | POA: Diagnosis not present

## 2021-05-02 DIAGNOSIS — E785 Hyperlipidemia, unspecified: Secondary | ICD-10-CM

## 2021-05-02 DIAGNOSIS — I5189 Other ill-defined heart diseases: Secondary | ICD-10-CM

## 2021-05-02 DIAGNOSIS — Z6841 Body Mass Index (BMI) 40.0 and over, adult: Secondary | ICD-10-CM

## 2021-05-02 DIAGNOSIS — Z79899 Other long term (current) drug therapy: Secondary | ICD-10-CM

## 2021-05-02 DIAGNOSIS — R0789 Other chest pain: Secondary | ICD-10-CM

## 2021-05-02 DIAGNOSIS — G4733 Obstructive sleep apnea (adult) (pediatric): Secondary | ICD-10-CM | POA: Diagnosis not present

## 2021-05-02 DIAGNOSIS — I890 Lymphedema, not elsewhere classified: Secondary | ICD-10-CM

## 2021-05-02 MED ORDER — VALSARTAN 40 MG PO TABS: 40.0000 mg | ORAL_TABLET | Freq: Every day | ORAL | 3 refills | Status: DC

## 2021-05-02 MED ORDER — CARVEDILOL 3.125 MG PO TABS: 3.1250 mg | ORAL_TABLET | Freq: Two times a day (BID) | ORAL | 5 refills | Status: DC

## 2021-05-02 NOTE — Patient Instructions (Signed)
Medication Instructions:  Your physician has recommended you make the following change in your medication:   START Carvedilol 3.125mg  TWICE daily     - STOP taking if you become short of breath and/or fatigue after starting Carvedilol.      Call our office to let us know if you have to stop medication.   *If you need a refill on your cardiac medications before your next appointment, please call your pharmacy*   Lab Work:  None ordered  Testing/Procedures:  None ordered   Follow-Up: At [facility], you and your health needs are our priority.  As part of our continuing mission to provide you with exceptional heart care, we have created designated Provider Care Teams.  These Care Teams include your primary Cardiologist (physician) and Advanced Practice Providers (APPs -  Physician Assistants and Nurse Practitioners) who all work together to provide you with the care you need, when you need it.  We recommend signing up for the patient portal called "MyChart".  Sign up information is provided on this After Visit Summary.  MyChart is used to connect with patients for Virtual Visits (Telemedicine).  Patients are able to view lab/test results, encounter notes, upcoming appointments, etc.  Non-urgent messages can be sent to your provider as well.   To learn more about what you can do with MyChart, go to [email or web address].    Your next appointment:   3 - 4 month(s)  The format for your next appointment:   In Person  Provider:   You may see Yvonne Kendall, MD or one of the following Advanced Practice Providers on your designated Care Team:    Marisue Ivan, [place]

## 2021-05-02 NOTE — Progress Notes (Signed)
Office Visit    Patient Name: Kristine Rangel Date of Encounter: 05/02/2021  PCP:  Kristine Sato, MD   [facility]  Cardiologist:  Kristine Kendall, MD  Advanced Practice Provider:  No care team member to display Electrophysiologist:  None   Chief Complaint    Chief Complaint  Patient presents with   Follow-up    1 Month follow up. Medications verbally reviewed with patient.      65 y.o. female with history of hypertension, hyperlipidemia, DM2, cerebral aneurysm s/p coiling, COPD, HFpEF, and here today for follow-up with 04/01/2021 visit.  Past Medical History    Past Medical History:  Diagnosis Date   Anxiety    Arthritis    Asthma    Bladder leak    Cerebral aneurysm    history-has coils   CHF (congestive heart failure) (HCC)    Chronic kidney disease    COPD (chronic obstructive pulmonary disease) (HCC)    Depression    Diabetes mellitus without complication (HCC)    Dyspnea    GERD (gastroesophageal reflux disease)    Glaucoma    Headache    History of kidney stones    Hyperlipidemia    Hypertension    MI (myocardial infarction) (HCC)    unsure when   Neuropathy    Rotator cuff injury    Seasonal allergies    Stroke [facility])    TIA's   Past Surgical History:  Procedure Laterality Date   BRAIN SURGERY Right 2010   anuerysm repair   CARPAL TUNNEL RELEASE Right 06/12/2017   Procedure: CARPAL TUNNEL RELEASE;  Surgeon: Kristine Fairly, MD;  Location: [facility] ORS;  Service: Orthopedics;  Laterality: Right;   CHOLECYSTECTOMY     JOINT REPLACEMENT Bilateral    TOTAL KNEE REPLACEMENT   SHOULDER ARTHROSCOPY WITH OPEN ROTATOR CUFF REPAIR Left 11/23/2016   Procedure: SHOULDER ARTHROSCOPY WITH OPEN ROTATOR CUFF REPAIR, ARTHROSCOPIC SUBACROMIAL DECOMPRESSION, DISTAL CLAVICLE EXCISION;  Surgeon: Kristine Fairly, MD;  Location: [facility] OR[facility];  Service: Orthopedics;  Laterality: Left;   SHOULDER ARTHROSCOPY WITH OPEN ROTATOR CUFF REPAIR Right 07/30/2018    Procedure: SHOULDER ARTHROSCOPY WITH OPEN ROTATOR CUFF REPAIR,SUBACROMINAL DECOMPRESSION AND DISTAL CLAVICLE EXCISION;  Surgeon: Kristine Fairly, MD;  Location: [facility] OR[facility];  Service: Orthopedics;  Laterality: Right;   TOTAL KNEE REVISION Right 05/05/2020   Procedure: right total knee arthroplasty revision;  Surgeon: Kristine Herrlich, MD;  Location: [facility] OR[facility];  Service: Orthopedics;  Laterality: Right;    Allergies  Allergies  Allergen Reactions   Bee Venom Anaphylaxis   Penicillins Hives, Swelling and Other (See Comments)    Has patient had a PCN reaction causing immediate rash, facial/tongue/throat swelling, SOB or lightheadedness with hypotension: Yes Has patient had a PCN reaction causing severe rash involving mucus membranes or skin necrosis: No Has patient had a PCN reaction that required hospitalization No Has patient had a PCN reaction occurring within the last 10 years: No If all of the above answers are "NO", then may proceed with Cephalosporin use.    History of Present Illness    Kristine Rangel is a 65 y.o. female with PMH as above.  She was previously evaluated by Dr. Juliann Rangel of [facility] clinic cardiology with most recent visit 02/2016.  At that time, notes referenced angina and shortness of breath with recommendation for sleep study.  MPI from 05/2015 normal.  Echo showed normal LVEF with mild LVH and moderate MR, as well as mild pulmonary hypertension.  [facility], complaining of worsening right leg pain.  She was referred to the ED for further evaluation, where she reported a 2-day history of chest pain.  CTA of the chest negative for PE.  Right lower extremity ultrasound negative for DVT.  High-sensitivity troponin negative x2.    She was initially seen in the office 02/04/2021 by Dr. Okey Rangel for chest pain at the request of Kristine Rangel.  She reported intermittent chest pain, radiating to the stomach and back, and  occurring over the last 1 to 2 months.  She reported 3-4 episodes in the last month, usually lasting 10 to 15 minutes.  The pain occurred randomly and was not associated with activity or eating.  She had tried gas medications without relief.  She was also on a PPI.  She had associated dizziness, as well as intermittent shortness of breath.  No palpitations.  She reported chronic bilateral lower extremity edema with right greater than that of left.  She had stable 3 pillow orthopnea.  She reported her recent chest discomfort is similar to that of 2017 when evaluated by Dr. Juliann Rangel.  It was felt that her chest pain was atypical, as it was not exertional and occurred randomly.  GI etiology was considered, given recent CTA with esophageal thickening.  Coronary CTA was obtained with coronary artery calcium score of 0.  Echo was obtained with results as below and EF 60 to 65%, G2 DD, RVSP 32.6 mmHg with RAP estimated 3 mmHg, mild LAE,.  Exam was limited by body habitus but lower extremity edema thought to reflect venous insufficiency and/or lymphedema.  She was continued on Lasix 40 mg twice daily.  Diet and exercise recommended.  Seen 03/11/2021 and noted lower extremity edema with right greater than that of left with recent right knee replacement.  She had lost her lymphedema pumps.  She used to wear thigh-high compression stockings, which cut into her legs.  Ace bandages were discussed, as well as the belt compression stockings.  She reported 3 pillow orthopnea.  At times, she would wake her self from sleep due to snoring and coughing.  She reported daytime fatigue.  No recent sleep study.  She had intermittent chest pain and shortness of breath without clear triggers and not necessarily related to exertion.  GI appointment for esophageal thickening seen on CT was pending.  She is working on cutting out salt from her diet.  She was trying to walk daily and eat 3 small meals per day.  She was trying to join [employer].  She was started on Imdur 30 mg daily and valsartan 40 mg daily.  Watch pat was ordered.  Seen 04/01/2021 and doing well on her Imdur.  She thought her chest pain may be improved in severity, though did note an episode occurring the previous evening when laying down.  CP described as left-sided and radiating into her back, lasting 2 to 5 minutes, and with associated diaphoresis.  She reported ongoing shortness of breath, dyspnea, and cough.  She was monitoring her BP with a digital cuff from [facility].  BP was significantly improved since starting valsartan.  She was sleeping on 2 pillows.  She had not yet had her lymphedema visit.  She reported ongoing sleep disordered breathing.  She was awaiting a call from GI.  She had not yet joined Silver sneakers but was walking every day.  For further BP support and antianginal effect, Imdur was  increased to 60 mg daily.  She was increased on Lasix 80 mg for 2 days with recommendation and then drop down to Lasix 40 mg daily thereafter.   She has not yet joined [employer] but still walking every day.  No signs or symptoms of bleeding.  Subsequent labs showed low potassium and creatinine bumped.  Recommendation was to hold ARB and Lasix for 2 days.  She was started on KCl tab 20 M EQ x2 for 2 days.  Recommendation was to recheck BMET at the end of the week to ensure stable renal function electrolytes.  Subsequent labs showed potassium WNL.  Sleep study showed mild obstructive sleep apnea with no central sleep apnea.  Oxygen desaturations were as low as 88%.  Mild snoring was noted.  She woke 12 times.  Apnea therapy was recommended given the presence of symptoms.  Recommendation was for auto CPAP therapy with a pressure range of 5 to 20 cm H2O.  Oral appliance was also discussed.  ENT consultation was noted to possibly be useful in the future.  If intolerant to CPAP therapy, ENT referral for possible hypoglossal nerve stimulator was recommended.  Follow-up  pulse ox study on CPAP was recommended to assess if supplemental oxygen or bilevel therapy was needed.  Weight loss advised.  Has lymphedema appt on 23rd Waiting for CPAP to arrive  One episode of CP last week. Layed down in bed and improved. Lasted 10 minutes. Started in back - felt  tightness. Left sided and sweaty. Two episodes of waking with bed wet.   BP 150 on the top. Going to send another BP cuff - wrist cuff right now. Not accurate getting new   About to join silver sneakers- waiting for heat. Ordered bike.   Little bit of SOB with walker.  Walking through house and feet pedals- -some sob and fatigue - 2-3 times per week and until getting to [place] and cannot be out in the son  Cupid shuffle- like on tv- slide feet into it.  Occasional racing hr, improved  2 pillows  Drinking both boost and ensure  Stopped meloxicam   Home Medications    Current Outpatient Medications on File Prior to Visit  Medication Sig Dispense Refill   aspirin EC 81 MG tablet Take 81 mg by mouth daily. Swallow whole.     atorvastatin (LIPITOR) 40 MG tablet Take 40 mg by mouth at bedtime.     brimonidine-timolol (COMBIGAN) 0.2-0.5 % ophthalmic solution Place 1 drop into both eyes 2 (two) times daily.     cetirizine (ZYRTEC) 10 MG tablet Take 10 mg by mouth daily.     DULoxetine (CYMBALTA) 60 MG capsule Take 60 mg by mouth at bedtime.      EPINEPHrine 0.3 mg/0.3 mL IJ SOAJ injection Inject 0.3 mg into the skin as needed for anaphylaxis.      esomeprazole (NEXIUM) 40 MG capsule Take 40 mg by mouth daily.     fluticasone (FLONASE) 50 MCG/ACT nasal spray Place 1 spray into both nostrils daily.      furosemide (LASIX) 40 MG tablet Take 40 mg by mouth 2 (two) times daily.      gabapentin (NEURONTIN) 300 MG capsule Take 300 mg by mouth 3 (three) times daily.     isosorbide mononitrate (IMDUR) 60 MG 24 hr tablet Take 1 tablet (60 mg total) by mouth daily. 30 tablet 5   latanoprost (XALATAN) 0.005 % ophthalmic  solution Place 1 drop into both eyes at bedtime.  meloxicam (MOBIC) 7.5 MG tablet Take 7.5 mg by mouth 2 (two) times daily.     MYRBETRIQ 25 MG TB24 tablet TAKE 1 TABLET BY MOUTH EVERY DAY for bladder     nitroGLYCERIN (NITROSTAT) 0.4 MG SL tablet Place 1 tablet (0.4 mg total) under the tongue every 5 (five) minutes as needed for chest pain. 25 tablet 3   SYMBICORT 160-4.5 MCG/ACT inhaler Inhale 2 puffs into the lungs 2 (two) times daily.     tiZANidine (ZANAFLEX) 4 MG tablet Take 2-4 mg by mouth every 6 (six) hours as needed for muscle spasms.      traMADol (ULTRAM) 50 MG tablet Take 50 mg by mouth every 6 (six) hours as needed.     valsartan (DIOVAN) 40 MG tablet Take 1 tablet (40 mg total) by mouth daily. 30 tablet 1   zolpidem (AMBIEN) 10 MG tablet Take 10 mg by mouth at bedtime as needed.     No current facility-administered medications on file prior to visit.    Review of Systems    She reports CP as described above, dyspnea, shortness of breath, fatigue, racing heart rate. She reports improvement from 3 pillow orthopnea to 2 pillow orthopnea.  She continues to sometimes wake up choking and snore. She reports daytime fatigue.  No palpitations,n, v, syncope, weight gain, or early satiety.  All other systems reviewed and are otherwise negative except as noted above.  Physical Exam    VS:  BP 122/80 (BP Location: Left Arm, Patient Position: Sitting, Cuff Size: Large)   Pulse 80   Ht 5\' 5"  (1.651 m)   Wt (!) 302 lb (137 kg)   SpO2 95%   BMI 50.26 kg/m  , BMI Body mass index is 50.26 kg/m. GEN: Obese female, in no acute distress. HEENT: normal. Neck: Supple, no JVD, carotid bruits, or masses. Cardiac: RRR, no murmurs, rubs, or gallops. No clubbing, cyanosis. bilateral trace mild nonpitting edema.  Radials/DP/PT 2+ and equal bilaterally.  Respiratory:  Respirations regular and unlabored, clear to auscultation bilaterally. GI: Soft, nontender, nondistended, BS + x 4. MS: no  deformity or atrophy.  Bilateral lower extremities wrapped with Velcro black sports wrap.  Nonpitting edema present in both lower extremities. Skin: warm and dry, no rash. Neuro:  Strength and sensation are intact. Psych: Normal affect.  Accessory Clinical Findings    ECG personally reviewed by me today -NSR,64 bpm, 152 ms, QRS 76 ms, repolarization changes- no acute changes.  VITALS Reviewed today   Temp Readings from Last 3 Encounters:  04/08/21 98.3 F (36.8 C) (Oral)  01/28/21 98.3 F (36.8 C) (Oral)  05/07/20 97.9 F (36.6 C) (Oral)   BP Readings from Last 3 Encounters:  05/02/21 122/80  04/08/21 119/64  04/01/21 140/70   Pulse Readings from Last 3 Encounters:  05/02/21 80  04/08/21 (!) 57  04/01/21 96    Wt Readings from Last 3 Encounters:  05/02/21 (!) 302 lb (137 kg)  04/08/21 300 lb (136.1 kg)  04/01/21 (!) 301 lb (136.5 kg)     LABS  reviewed today   Lab Results  Component Value Date   WBC 4.7 04/08/2021   HGB 11.1 (L) 04/08/2021   HCT 34.1 (L) 04/08/2021   MCV 83.4 04/08/2021   PLT 364 04/08/2021   Lab Results  Component Value Date   CREATININE 1.18 (H) 04/25/2021   BUN 16 04/25/2021   NA 139 04/25/2021   K 3.8 04/25/2021   CL 103 04/25/2021   CO2  28 04/25/2021   Lab Results  Component Value Date   ALT 21 01/28/2021   AST 30 01/28/2021   ALKPHOS 105 01/28/2021   BILITOT 0.6 01/28/2021   No results found for: CHOL, HDL, LDLCALC, LDLDIRECT, TRIG, CHOLHDL  Lab Results  Component Value Date   HGBA1C 6.1 (H) 07/24/2018   No results found for: TSH   STUDIES/PROCEDURES reviewed today   CT coronary score 02/24/21 IMPRESSION: 1. Normal coronary calcium score of 0. Patient is low risk for coronary events. 2. Normal coronary origin with right dominance. 3. No evidence of CAD. 4. CAD-RADS 0. Consider non-atherosclerotic causes of chest pain.  Echo 03/03/21  1. Left ventricular ejection fraction, by estimation, is 60 to 65%. The  left  ventricle has normal function. The left ventricle has no regional  wall motion abnormalities. Left ventricular diastolic parameters are  consistent with Grade II diastolic  dysfunction (pseudonormalization).   2. Right ventricular systolic function is normal. The right ventricular  size is normal. There is normal pulmonary artery systolic pressure. The  estimated right ventricular systolic pressure is 32.6 mmHg.   3. Left atrial size was mildly dilated.    Previous visit STOP-BANG Rex assessment  S-snore  Have you been told that you snore?   Y  T-tired Are you often tired, fatigued, sleepy during the day?   Y  O-obstruction Do you stop breathing, choke, or gasp during sleep?   Y  P-pressure Do you have or are you being treated for high blood pressure?  Y  B- BMI Is your BMI greater than 35 kg/m?  Y  A- age Are you 50 years or older?  Y  N-neck Do you have a neck circumference greater than 16 inches?  Y  G-gender Are you female?   N  Total  7   Risk Score <3 Low risk of OSA ?3 High risk of OSA   Assessment & Plan    Chest pain, atypical -- Going atypical chest pain, though it may have improved in severity since addition of Imdur.  EKG without acute ST/T changes.  Coronary CTA with CAC score 0.  Echo with EF 60 to 65%, NRWMA, and no significant valvular disease.  She has been referred to GI, given recent CT with esophageal thickening.  Encouraged her to follow-up with GI as scheduled.  We will start Imdur 30 mg daily today for additional CP relief, possible relief from esophageal spasm, as well as BP control.  As needed sublingual nitro provided.  We will also provide short-term increase in diuresis as outlined below, as elevated volume status could be contributing to her CP.  Reviewed recommendations for heart healthy diet, as well as aggressive treatment of risk factors with patient understanding.  HTN, BP goal <130/80 -- BP today suboptimal but improved from her previous  clinic visit with initiation of Imdur and valsartan.    Will increase to Imdur 60 mg daily.  Short-term increase in diuresis recommended today as outlined below.  Discussed possible increase in valsartan versus addition of low-dose carvedilol.   Recommend home BP checks/weight checks at home to monitor volume status.  Continue to recommend low-salt diet and monitoring of fluids.  Diet and exercise encouraged.  Given suspicion for sleep apnea, Watch Pat provided at previous clinic visit.   Grade 2 diastolic dysfunction -- Reports shortness of breath, dyspnea, and unchanged cough.  Most recent echo as above with grade 2 diastolic dysfunction, RVSP 32.6 mmHg with RAP estimated at 3 mmHg,  and mild LAE.  Encouraged control of blood pressure and treatment of possible sleep apnea, if needed.  Weight stable from previous clinic visit. Suspect that lower extremity edema is mostly 2/2 venous insufficiency/lymphedema.  However, given her most recent episode of of chest discomfort with ongoing dyspnea and shortness of breath when laying flat, will increase to Lasix 80 mg x 2/day for 2 days and then drop down to Lasix 40 mg x 2/day.  She previously owned lymphedema pumps and referral to lymphedema clinic provided at her last office visit. Watch Pat provided / pending results.  Will increase to Imdur 60 mg daily.  Further recommendations regarding adjustment of current medications/addition of Coreg pending labs.    Lymphedema/venous insufficiency -- Previously owned lymphedema pumps.  At her previous clinic visit, information provided regarding venous insufficiency/lymphedema to patient during visit and placed on AVS as well.  Continue to recommend leg wraps.  Leg elevation discussed with patient understanding.  Sleep disordered breathing -- Technical brewer completed with recommendation for CPAP and oral appliance, as well as possible night oxygen or hypoglossal stimulation and as outlined directly above.  Morbid obesity --  BMI 50.09 with encouragement for weight loss through diet and exercise.  Weight loss will also help improve obstructive sleep apnea if present, and it is recommended for both BP and glycemic control, as well as overall health. Diet changes provided in AVS.  [employer] said can keep in contact with [place]   Medication changes: Start Coreg 3.125mg  BID  Labs ordered: BMET today Studies / Imaging ordered: Technical brewer (ordered at last visit, pending results) Referral provided: Lymphedema clinic (pending contact with patient) Future considerations: Escalation of valsartan / Lasix.  Start Coreg. Disposition: RTC 1 month.  *Please be aware that the above documentation was completed voice recognition software and may contain dictation errors.       Lennon Alstrom, PA-C 05/02/2021

## 2021-05-04 NOTE — Progress Notes (Signed)
Office Visit    Patient Name: Kristine Rangel Date of Encounter: 05/02/2021  PCP:  Leanna Sato, MD   [facility]  Cardiologist:  Yvonne Kendall, MD  Advanced Practice Provider:  No care team member to display Electrophysiologist:  None   Chief Complaint    Chief Complaint  Patient presents with   Follow-up    1 Month follow up. Medications verbally reviewed with patient.      65 y.o. female with history of hypertension, hyperlipidemia, DM2, cerebral aneurysm s/p coiling, COPD, HFpEF, and here today for 1 month follow-up of 04/01/2021 visit.  Past Medical History    Past Medical History:  Diagnosis Date   Anxiety    Arthritis    Asthma    Bladder leak    Cerebral aneurysm    history-has coils   CHF (congestive heart failure) (HCC)    Chronic kidney disease    COPD (chronic obstructive pulmonary disease) (HCC)    Depression    Diabetes mellitus without complication (HCC)    Dyspnea    GERD (gastroesophageal reflux disease)    Glaucoma    Headache    History of kidney stones    Hyperlipidemia    Hypertension    MI (myocardial infarction) (HCC)    unsure when   Neuropathy    Rotator cuff injury    Seasonal allergies    Stroke [facility])    TIA's   Past Surgical History:  Procedure Laterality Date   BRAIN SURGERY Right 2010   anuerysm repair   CARPAL TUNNEL RELEASE Right 06/12/2017   Procedure: CARPAL TUNNEL RELEASE;  Surgeon: Juanell Fairly, MD;  Location: [facility] ORS;  Service: Orthopedics;  Laterality: Right;   CHOLECYSTECTOMY     JOINT REPLACEMENT Bilateral    TOTAL KNEE REPLACEMENT   SHOULDER ARTHROSCOPY WITH OPEN ROTATOR CUFF REPAIR Left 11/23/2016   Procedure: SHOULDER ARTHROSCOPY WITH OPEN ROTATOR CUFF REPAIR, ARTHROSCOPIC SUBACROMIAL DECOMPRESSION, DISTAL CLAVICLE EXCISION;  Surgeon: Juanell Fairly, MD;  Location: [facility] ORS;  Service: Orthopedics;  Laterality: Left;   SHOULDER ARTHROSCOPY WITH OPEN ROTATOR CUFF REPAIR Right  07/30/2018   Procedure: SHOULDER ARTHROSCOPY WITH OPEN ROTATOR CUFF REPAIR,SUBACROMINAL DECOMPRESSION AND DISTAL CLAVICLE EXCISION;  Surgeon: Juanell Fairly, MD;  Location: [facility] ORS;  Service: Orthopedics;  Laterality: Right;   TOTAL KNEE REVISION Right 05/05/2020   Procedure: right total knee arthroplasty revision;  Surgeon: Lyndle Herrlich, MD;  Location: [facility] ORS;  Service: Orthopedics;  Laterality: Right;    Allergies  Allergies  Allergen Reactions   Bee Venom Anaphylaxis   Penicillins Hives, Swelling and Other (See Comments)    Has patient had a PCN reaction causing immediate rash, facial/tongue/throat swelling, SOB or lightheadedness with hypotension: Yes Has patient had a PCN reaction causing severe rash involving mucus membranes or skin necrosis: No Has patient had a PCN reaction that required hospitalization No Has patient had a PCN reaction occurring within the last 10 years: No If all of the above answers are "NO", then may proceed with Cephalosporin use.    History of Present Illness    Kristine Rangel is a 65 y.o. female with PMH as above.  She was previously evaluated by Dr. Juliann Pares of [facility] clinic cardiology with most recent visit 02/2016.  At that time, notes referenced angina and shortness of breath with recommendation for sleep study.  MPI from 05/2015 normal.  Echo showed normal LVEF with mild LVH and moderate MR, as well as mild pulmonary  hypertension.  [facility], complaining of worsening right leg pain.  She was referred to the ED for further evaluation, where she reported a 2-day history of chest pain.  CTA of the chest negative for PE.  Right lower extremity ultrasound negative for DVT.  High-sensitivity troponin negative x2.    She was initially seen in the office 02/04/2021 by Dr. Okey Dupre for chest pain at the request of Kristine Rangel.  She reported intermittent chest pain, radiating to the stomach and  back, and occurring over the last 1 to 2 months.  She reported 3-4 episodes in the last month, usually lasting 10 to 15 minutes.  The pain occurred randomly and was not associated with activity or eating.  She had tried gas medications without relief.  She was also on a PPI.  She had associated dizziness, as well as intermittent shortness of breath.  No palpitations.  She reported chronic bilateral lower extremity edema with right greater than that of left.  She had stable 3 pillow orthopnea.  She reported her recent chest discomfort is similar to that of 2017 when evaluated by Dr. Juliann Pares.  It was felt that her chest pain was atypical, as it was not exertional and occurred randomly.  GI etiology was considered, given recent CTA with esophageal thickening.  Coronary CTA was obtained with coronary artery calcium score of 0.  Echo was obtained with results as below and EF 60 to 65%, G2 DD, RVSP 32.6 mmHg with RAP estimated 3 mmHg, mild LAE,.  Exam was limited by body habitus but lower extremity edema thought to reflect venous insufficiency and/or lymphedema.  She was continued on Lasix 40 mg twice daily.  Diet and exercise recommended.  Seen 03/11/2021 with LEE and R>L s/p recent right knee replacement.  She had lost her lymphedema pumps.  She used to wear thigh-high compression stockings, which cut into her legs.  Ace bandages were discussed, as well as the belt compression stockings.  She reported 3 pillow orthopnea and PND.  She had daytime fatigue.  No recent sleep study.  She had intermittent chest pain and shortness of breath without clear triggers and not necessarily related to exertion.  GI appointment for esophageal thickening seen on CT was pending.  She is working on cutting out salt from her diet.  She was trying to walk daily and eat 3 small meals per day.  She was trying to join [employer].  She was started on Imdur 30 mg daily and valsartan 40 mg daily.  Watch pat was ordered.  Seen 04/01/2021  and doing well on her Imdur.  CP improving - described as left-sided and radiating into her back, lasting 2 to 5 minutes, and with associated diaphoresis.  She reported ongoing shortness of breath, dyspnea, and cough.  She was monitoring her BP with a digital cuff from [facility].  BP was significantly improved since starting valsartan.  She was sleeping on 2 pillows.  She had not yet had her lymphedema visit.  She reported ongoing sleep disordered breathing.  She was awaiting a call from GI.  She had not yet joined Silver sneakers but was walking every day.  For further BP support and antianginal effect, Imdur was increased to 60 mg daily.  She was increased on Lasix 80 mg for 2 days with recommendation and then drop down to Lasix 40 mg daily thereafter.   She had not yet joined [employer] but still walking  every day.  No signs or symptoms of bleeding.  Subsequent labs showed low potassium and creatinine bumped.  Recommendation was to hold ARB and Lasix for 2 days.  She was started on KCl tab 20 M EQ x2 for 2 days.  Recommendation was to recheck BMET at the end of the week to ensure stable renal function electrolytes.  Subsequent labs showed potassium WNL.  Sleep study showed mild obstructive sleep apnea with no central sleep apnea.  Oxygen desaturations were as low as 88%.  Mild snoring was noted.  She woke 12 times.  Apnea therapy was recommended given the presence of symptoms.  Recommendation was for auto CPAP therapy with a pressure range of 5 to 20 cm H2O.  Oral appliance was also discussed.  ENT consultation was noted to possibly be useful in the future.  If intolerant to CPAP therapy, ENT referral for possible hypoglossal nerve stimulator was recommended.  Follow-up pulse ox study on CPAP was recommended to assess if supplemental oxygen or bilevel therapy was needed.  Weight loss advised.  Today, 05/02/2021, she returns to clinic and notes that she has been doing well since her 5/20 visit.  She had 1  episode of CP last week felt similar to those described above.  CP was described as chest tightness and left-sided.  CP radiated to her back with associated diaphoresis.  CP improved with lying down in bed and lasted approximately 10 minutes.  Since her last clinic visit, she has had 2 episodes of waking up at night with the sheets soaked with her sweat.  She continues to have episodes of apnea as previously described.  She is still waiting on her CPAP to arrive with most recent obstructive sleep apnea recommendations/Dr. Mayford Knife recommendations reviewed.  She has ongoing lower extremity edema with her lymphedema appt on 23rd.  She overall feels her volume status is improved from previous clinic visits.  Her BP continues to be improved.  She continues to monitor her BP at home and reports that [facility] is going to send her another BP cuff.  Right now, she is using a wrist cuff.  She states that this wrist cuff is not accurate, but [employer] has been in contact with her and is going to replace it with a brachial cuff.  At home, SBP 150 max.  She reports satisfaction with [facility] and that they read the cardiology notes, which is reassuring as it shows continuity of care.  She is about to join [employer].  She ordered a bike, though she notes she will likely wait until the heat subsides before biking outside.  She does not tolerate the heat well.  She has a home pedal machine and is interested in purchasing the Cupid shuffle, which she saw on TV, and which allows her to slide her feet into the pedals.  She believes this will be more comfortable for her at home workout routines. She intends to continue home workouts and walking around the house with her walker at least 2-3 times per week [facility]  She reports some dyspnea and fatigue when ambulating with her walker.  No chest pain with exertion.  She has intermittent tachypalpitations, though improved from previous  visits.  She still reports improved two-pillow orthopnea.  She is drinking both boost and Ensure, which we discussed could be the reason for her earlier elevated potassium.  Though, as her potassium levels are now at goal,  it was noted she could likely continue these.  She has stopped her meloxicam.  She reports medication compliance.  No signs or symptoms of bleeding.  Home Medications    Current Outpatient Medications on File Prior to Visit  Medication Sig Dispense Refill   aspirin EC 81 MG tablet Take 81 mg by mouth daily. Swallow whole.     atorvastatin (LIPITOR) 40 MG tablet Take 40 mg by mouth at bedtime.     brimonidine-timolol (COMBIGAN) 0.2-0.5 % ophthalmic solution Place 1 drop into both eyes 2 (two) times daily.     cetirizine (ZYRTEC) 10 MG tablet Take 10 mg by mouth daily.     DULoxetine (CYMBALTA) 60 MG capsule Take 60 mg by mouth at bedtime.      EPINEPHrine 0.3 mg/0.3 mL IJ SOAJ injection Inject 0.3 mg into the skin as needed for anaphylaxis.      esomeprazole (NEXIUM) 40 MG capsule Take 40 mg by mouth daily.     fluticasone (FLONASE) 50 MCG/ACT nasal spray Place 1 spray into both nostrils daily.      furosemide (LASIX) 40 MG tablet Take 40 mg by mouth 2 (two) times daily.      gabapentin (NEURONTIN) 300 MG capsule Take 300 mg by mouth 3 (three) times daily.     isosorbide mononitrate (IMDUR) 60 MG 24 hr tablet Take 1 tablet (60 mg total) by mouth daily. 30 tablet 5   latanoprost (XALATAN) 0.005 % ophthalmic solution Place 1 drop into both eyes at bedtime.     meloxicam (MOBIC) 7.5 MG tablet Take 7.5 mg by mouth 2 (two) times daily.     MYRBETRIQ 25 MG TB24 tablet TAKE 1 TABLET BY MOUTH EVERY DAY for bladder     nitroGLYCERIN (NITROSTAT) 0.4 MG SL tablet Place 1 tablet (0.4 mg total) under the tongue every 5 (five) minutes as needed for chest pain. 25 tablet 3   SYMBICORT 160-4.5 MCG/ACT inhaler Inhale 2 puffs into the lungs 2 (two) times daily.     tiZANidine (ZANAFLEX) 4 MG  tablet Take 2-4 mg by mouth every 6 (six) hours as needed for muscle spasms.      traMADol (ULTRAM) 50 MG tablet Take 50 mg by mouth every 6 (six) hours as needed.     valsartan (DIOVAN) 40 MG tablet Take 1 tablet (40 mg total) by mouth daily. 30 tablet 1   zolpidem (AMBIEN) 10 MG tablet Take 10 mg by mouth at bedtime as needed.     No current facility-administered medications on file prior to visit.    Review of Systems    She reports an additional episode of left-sided chest tightness/CP as described above and with associated diaphoresis though improving with lying flat and 10 minutes of rest.  She has mild dyspnea when ambulating with her walker.  She reports fatigue, intermittent racing heart rate racing heart rate. She reports ongoing improvement from 3 pillow orthopnea to 2 pillow orthopnea.  She is waiting on her CPAP for symptoms of apnea.  No n, v, syncope, weight gain, or early satiety.  All other systems reviewed and are otherwise negative except as noted above.  Physical Exam    VS:  BP 122/80 (BP Location: Left Arm, Patient Position: Sitting, Cuff Size: Large)   Pulse 80   Ht 5\' 5"  (1.651 m)   Wt (!) 302 lb (137 kg)   SpO2 95%   BMI 50.26 kg/m  , BMI Body mass index is 50.26 kg/m. GEN: Obese female,  in no acute distress. HEENT: normal. Neck: Supple, no JVD, carotid bruits, or masses. Cardiac: RRR with occasional extrasystole, no murmurs, rubs, or gallops. No clubbing, cyanosis. bilateral trace mild nonpitting edema.  Right/left lower extremity with Ace bandage sports wrap.  Radials/DP/PT 2+ and equal bilaterally.  Respiratory:  Respirations regular and unlabored, clear to auscultation bilaterally. GI: Soft, nontender, nondistended, BS + x 4. MS: no deformity or atrophy.  Skin: warm and dry, no rash. Neuro:  Strength and sensation are intact. Psych: Normal affect.  Accessory Clinical Findings    ECG personally reviewed by me today -NSR, 80bpm, occasional PVCs- no acute  changes.  VITALS Reviewed today   Temp Readings from Last 3 Encounters:  04/08/21 98.3 F (36.8 C) (Oral)  01/28/21 98.3 F (36.8 C) (Oral)  05/07/20 97.9 F (36.6 C) (Oral)   BP Readings from Last 3 Encounters:  05/02/21 122/80  04/08/21 119/64  04/01/21 140/70   Pulse Readings from Last 3 Encounters:  05/02/21 80  04/08/21 (!) 57  04/01/21 96    Wt Readings from Last 3 Encounters:  05/02/21 (!) 302 lb (137 kg)  04/08/21 300 lb (136.1 kg)  04/01/21 (!) 301 lb (136.5 kg)     LABS  reviewed today   Lab Results  Component Value Date   WBC 4.7 04/08/2021   HGB 11.1 (L) 04/08/2021   HCT 34.1 (L) 04/08/2021   MCV 83.4 04/08/2021   PLT 364 04/08/2021   Lab Results  Component Value Date   CREATININE 1.18 (H) 04/25/2021   BUN 16 04/25/2021   NA 139 04/25/2021   K 3.8 04/25/2021   CL 103 04/25/2021   CO2 28 04/25/2021   Lab Results  Component Value Date   ALT 21 01/28/2021   AST 30 01/28/2021   ALKPHOS 105 01/28/2021   BILITOT 0.6 01/28/2021   No results found for: CHOL, HDL, LDLCALC, LDLDIRECT, TRIG, CHOLHDL  Lab Results  Component Value Date   HGBA1C 6.1 (H) 07/24/2018   No results found for: TSH   STUDIES/PROCEDURES reviewed today   CT coronary score 02/24/21 IMPRESSION: 1. Normal coronary calcium score of 0. Patient is low risk for coronary events. 2. Normal coronary origin with right dominance. 3. No evidence of CAD. 4. CAD-RADS 0. Consider non-atherosclerotic causes of chest pain.  Echo 03/03/21  1. Left ventricular ejection fraction, by estimation, is 60 to 65%. The  left ventricle has normal function. The left ventricle has no regional  wall motion abnormalities. Left ventricular diastolic parameters are  consistent with Grade II diastolic  dysfunction (pseudonormalization).   2. Right ventricular systolic function is normal. The right ventricular  size is normal. There is normal pulmonary artery systolic pressure. The  estimated right  ventricular systolic pressure is 32.6 mmHg.   3. Left atrial size was mildly dilated.    Previous visit STOP-BANG Rex assessment  S-snore  Have you been told that you snore?   Y  T-tired Are you often tired, fatigued, sleepy during the day?   Y  O-obstruction Do you stop breathing, choke, or gasp during sleep?   Y  P-pressure Do you have or are you being treated for high blood pressure?  Y  B- BMI Is your BMI greater than 35 kg/m?  Y  A- age Are you 50 years or older?  Y  N-neck Do you have a neck circumference greater than 16 inches?  Y  G-gender Are you female?   N  Total  7  Risk Score <3 Low risk of OSA ?3 High risk of OSA   Assessment & Plan    Chest pain, atypical -- Ongoing atypical chest pain, though improved with addition of Imdur.  EKG without acute ST/T changes.  CAC score 0.  Echo EF 60 to 65%, NRWMA, no significant valvular disease. Seeing GI for esophageal thickening, which could be contributing.  Encouraged GI follow-up.  Imdur should also help with any esophageal spasm discomfort.  She does have PVCs on EKG and elevated DBP with low-dose Coreg started today.  As noted in the past, suspect labile volume status and BP may also contribute to her CP episodes. Reviewed recommendations for heart healthy diet, as well as aggressive treatment of risk factors with patient understanding.  Continue ASA, Coreg, statin.  Continue as needed sublingual nitro.  If ongoing CP at RTC, consider escalation of Imdur versus increased dose Coreg if room in BP.  HTN, BP goal <130/80 --BP significantly improved on Imdur, Lasix, and valsartan.  DBP today borderline at 80 with PVCs noted on EKG, so we will start on low-dose beta-blocker with carvedilol 3.125 mg twice daily.  Ongoing home BP checks/weight checks at home to monitor volume status.  She is working to exchange her wrist cuff for that of a brachial cuff.  Continue to recommend low-salt diet and monitoring of fluids.  Ongoing diet  and exercise encouraged.  Suspect further improvement in BP with treatment of sleep apnea, diagnosed after her WatchPat and with CPAP pending arrival.  Grade 2 diastolic dysfunction --Reports ongoing mild dyspnea with ambulation.  Volume status significantly improved since increase of diuresis after most recent clinic visit.  Wt increased 1 pound from 301 pounds to 302 pounds.  BP improved.  Echo with G2DD, RVSP 32.6 mmHg, mild LAE. Ongoing BP monitoring /fluid and salt restrictions reviewed.  Treatment of apnea with CPAP. LEE minimal and likely 2/2 venous insufficiency/lymphedema with patient scheduled for lymphedema clinic visit.  Continue furosemide 40 mg twice daily, Imdur 60 mg daily, valsartan 40 mg daily, and newly added Coreg 3.125 mg twice daily.     Lymphedema/venous insufficiency -- Previously owned lymphedema pumps. Continue to recommend leg wraps, leg elevation.  She has follow-up scheduled with the lymphedema clinic.  Obstructive sleep apnea -- Technical brewer completed with recommendation for CPAP and oral appliance, as well as possible night oxygen or hypoglossal stimulation and as outlined directly above.  She reports the CPAP is being shipped to her now.  HLD --Continue statin and LDL monitoring per PCP.  Morbid obesity -- Encouraged ongoing diet and exercise.  Treatment of sleep apnea will help with weight loss as well.  Recommend ongoing BP and glycemic control.  Medication changes: Start Coreg 3.125mg  BID  Labs ordered: None Studies / Imaging ordered: None Referral provided: None Future considerations: Escalation of valsartan / Lasix / Imdur / Coreg if needed Disposition: RTC 3-4 months  *Please be aware that the above documentation was completed voice recognition software and may contain dictation errors.       Lennon Alstrom, PA-C 05/02/2021

## 2021-05-05 ENCOUNTER — Ambulatory Visit: Payer: Medicare HMO | Attending: Physician Assistant | Admitting: Occupational Therapy

## 2021-05-05 ENCOUNTER — Other Ambulatory Visit: Payer: Self-pay

## 2021-05-05 ENCOUNTER — Encounter: Payer: Self-pay | Admitting: Occupational Therapy

## 2021-05-05 DIAGNOSIS — I890 Lymphedema, not elsewhere classified: Secondary | ICD-10-CM | POA: Diagnosis present

## 2021-05-05 NOTE — Patient Instructions (Addendum)

## 2021-05-06 ENCOUNTER — Encounter: Payer: Self-pay | Admitting: Occupational Therapy

## 2021-05-06 NOTE — Therapy (Signed)
[place] [facility] MAIN [facility] [address] [place], [place], [address] Phone: [phone]   Fax:  [phone]  Occupational Therapy Evaluation  Patient Details  Name: Kristine DrossCathy A Rangel MRN: [id number] Date of Birth: Feb 25, 1956 Referring Provider (OT): Lennon AlstromJacquelyn D Visser, PA-C   Encounter Date: 05/05/2021   OT End of Session - 05/05/21 1335     Visit Number 1    Number of Visits 36    Date for OT Re-Evaluation 08/03/21    OT Start Time 1100    OT Stop Time 1215    OT Time Calculation (min) 75 min    Activity Tolerance Patient tolerated treatment well;No increased pain    Behavior During Therapy WFL for tasks assessed/performed             Past Medical History:  Diagnosis Date   Anxiety    Arthritis    Asthma    Bladder leak    Cerebral aneurysm    history-has coils   CHF (congestive heart failure) (HCC)    Chronic kidney disease    COPD (chronic obstructive pulmonary disease) (HCC)    Depression    Diabetes mellitus without complication (HCC)    Dyspnea    GERD (gastroesophageal reflux disease)    Glaucoma    Headache    History of kidney stones    Hyperlipidemia    Hypertension    MI (myocardial infarction) (HCC)    unsure when   Neuropathy    Rotator cuff injury    Seasonal allergies    Stroke [facility](HCC)    TIA's    Past Surgical History:  Procedure Laterality Date   BRAIN SURGERY Right 2010   anuerysm repair   CARPAL TUNNEL RELEASE Right 06/12/2017   Procedure: CARPAL TUNNEL RELEASE;  Surgeon: Juanell FairlyKrasinski, Kevin, MD;  Location: [facility] ORS;  Service: Orthopedics;  Laterality: Right;   CHOLECYSTECTOMY     JOINT REPLACEMENT Bilateral    TOTAL KNEE REPLACEMENT   SHOULDER ARTHROSCOPY WITH OPEN ROTATOR CUFF REPAIR Left 11/23/2016   Procedure: SHOULDER ARTHROSCOPY WITH OPEN ROTATOR CUFF REPAIR, ARTHROSCOPIC SUBACROMIAL DECOMPRESSION, DISTAL CLAVICLE EXCISION;  Surgeon: Juanell FairlyKevin Krasinski, MD;  Location: [facility];  Service:  Orthopedics;  Laterality: Left;   SHOULDER ARTHROSCOPY WITH OPEN ROTATOR CUFF REPAIR Right 07/30/2018   Procedure: SHOULDER ARTHROSCOPY WITH OPEN ROTATOR CUFF REPAIR,SUBACROMINAL DECOMPRESSION AND DISTAL CLAVICLE EXCISION;  Surgeon: Juanell FairlyKrasinski, Kevin, MD;  Location: [facility];  Service: Orthopedics;  Laterality: Right;   TOTAL KNEE REVISION Right 05/05/2020   Procedure: right total knee arthroplasty revision;  Surgeon: Lyndle HerrlichBowers, James R, MD;  Location: [facility] OR[facility];  Service: Orthopedics;  Laterality: Right;    There were no vitals filed for this visit.   Subjective Assessment - 05/05/21 1133     Subjective  Pt is referred to Occupational Therapy by Westley FootsJacqueline Visser, PA-C for evaluation and treatment of BLE lymphedema. Pt reports insideous onset of leg swelling many years ago. She is unable t identify a precipitating event. Pt endorses family history of leg swelling in her grandfather. Pt has not previously undergone lymphedema treatment. She tells me she is unable to wear off the shelf compression stockings, and she had a "pump", but can't find it. Pt's goals for therapy are to reduce leg swelling and pain so she "can do more".    Pertinent History OA knees and back,  OSA, HTN, morbid obesity (BMI 50.0-59.9) , hx anxiety and depression, hx cereberal aneurism,  glaucoma, hx MI, neuropathy, B rotator cuff  repairs, Hx TIAs. FOLLOW LYMPHEDREMA PRECAUTIONS FOR diastolic dysfunction, asthma, CKD, dyspnea, CHF, COPD DM    Limitations difficulty walking, impaired standing balance, dyspnea, decreased dynamic balance, decreased LB strength, decreased A/PROM 2/2 body habitus and skin approximation,  increased supportive care needs and decreased quality of life, impaired body image    Repetition Increases Symptoms    Patient Stated Goals "Get these legs straightened out make it not hurt so bad so I can do more."    Currently in Pain? Yes    Pain Score 8     Pain Location Leg    Pain Orientation Right;Left   R>L    Pain Descriptors / Indicators Contraction;Heaviness;Tiring;Discomfort;Tightness;Numbness;Sore;Tender;Pins and needles;Tingling    Pain Type Chronic pain    Pain Onset More than a month ago   RTK 2016. S/P revision 2021. Chronic pain started ~ 2 yrs before revision and persists today   Pain Frequency Constant    Aggravating Factors  walking, standing, dependent sitting,    Pain Relieving Factors elevation    Effect of Pain on Daily Activities chronic leg swelling and associated pain limits functional ambulation and transfers, basic and instrumental ADLs performance, leisure pursuits and productive activities, socialization and body image    Multiple Pain Sites Yes    Pain Location Back               [facility] OT Assessment - 05/05/21 1141       Assessment   Medical Diagnosis BLE  stage II lymphedema    Referring Provider (OT) Lennon Alstrom, PA-C    Onset Date/Surgical Date --   2016 s/p R TKA. Worsened with 2021 revision   Hand Dominance Right    Prior Therapy No prior CDT. Unable to wear off-the-shelf compression stockings; "pump" is missing      Restrictions   Weight Bearing Restrictions No      Balance Screen   Has the patient fallen in the past 6 months No    Has the patient had a decrease in activity level because of a fear of falling?  Yes      Home  Environment   Family/patient expects to be discharged to: Private residence    Living Arrangements Alone    Type of Home Aartment    Home Access Level entry    Home Layout One level    Alternate Level Stairs - Number of Steps apartment    Bathroom Shower/Tub Tub/Shower unit   broken tub bench- needs replacement   Shower/tub characteristics Publishing copy   consider raised toilet seat   Bathroom Accessibility Yes    How accessible Accessible via walker    Home Equipment Walker - 4 wheels;Hand held shower head;Grab bars - tub/shower;Grab bars - toilet    Additional Comments tub transfer bench broken-  needs replacement to reduce falls risk. Pt has difficulty w STS transfers and Standard height toilet is difficult to rise from . Pt would benefit from elevating toilet seat and adding arm rests for support by adding an adjustable height BSC frame over toilet.      Prior Function   Level of Independence Independent with household mobility with device;Requires assistive device for independence;Needs assistance with ADLs;Needs assistance with homemaking;Needs assistance with gait;Needs assistance with transfers    Vocation Retired   [employer] tablet, read, puzzles, minimal socializing      IADL   Prior Level of Function Shopping Shops independently for [employer];  Shopping Shops independently for [employer]    Prior Level of Function Light Housekeeping Mod A    Light Housekeeping All laundry must be done by others;Needs help with all home maintenance tasks    Prior Level of Function Meal Prep Max A    Meal Prep Able to complete simple cold meal and snack prep    Prior Level of Function Community Mobility mAX a    Data processing manager Travel limited to taxi or vehicle with assistance of another      Mobility   Mobility Status History of falls      Vision - History   Baseline Vision Wears glasses all the time    Visual History Glaucoma      Activity Tolerance   Activity Tolerance Tolerates 10-20 min activity with multiple rests    Activity Tolerance Comments FREQUENT SEATED REST BREAKS NEEDED WHEN STANDING AND/ OR WALKING > 10 MINUTES DUE TO WORSENing leg swelling and associated pain with weight bearing      Cognition   Overall Cognitive Status Within Functional Limits for tasks assessed      Observation/Other Assessments   Focus on Therapeutic Outcomes (FOTO)  intake 12/100      Posture/Postural Control   Posture/Postural Control Postural limitations    Postural Limitations Rounded Shoulders;Forward head;Increased lumbar lordosis      Sensation   Light Touch Appears  Intact      Coordination   Gross Motor Movements are Fluid and Coordinated Yes                Moderate BLE Lipo-lymphedema 2/2 lipedema, Type II Skin  Description Hyper-Keratosis Peau' de Orange Shiny Tight Fibrotic/ Indurated Fatty Doughy spongy       moderate x x   x   Skin dry Flaky WNL Macerated   mild      Color Redness Present Pallor Blanching Hemosiderin Staining Mottled       x    Odor Malodorous Yeast Fungal infection  Absent      x   Temperature Warm Cool wnl    x     Pitting Edema   1+ 2+ 3+ 4+ Non-pitting        x   Girth Symmetrical Asymmetrical                   Distribution   x   Knees to bottocks     Stemmer Sign Positive Negative    -   Lymphorrhea History Of:  Present Absent     x    Wounds History Of Present Absent Venous Arterial Pressure Mechanical     x        Signs of Infection Redness Warmth Erythema Acute Swelling Drainage Borders                    Sensation Light Touch Deep pressure Hypersensitivty   Present Impaired Present Impaired Absent Impaired    x x   x    Nails WNL   Fungus nail dystrophy        Hair Growth Symmetrical Asymmetrical   x    Skin Creases Base of toes  Ankles   Base of Fingers Medial Thighs         Abdominal pannus Lobules  Face/neck       x Medial knees and thighs                OT Education - 05/05/21 1410  Education Details Provided Pt education regarding lymphatic structure and function, etiologies, onset patterns and stages of progression. Discussed  impact of obesity on lymphatic function. Outlined Complete Decongestive Therapy (CDT)  as standard of care and provided in depth information regarding 4 primary components of both Intensive and Self Management Phases, including Manual Lymph Drainage (MLD), compression wrapping and garments, skin care, and therapeutic exercise.   Homero Fellers discussion of high burden of care and contributing impact of existing co morbidities.  We discussed  Importance of daily, ongoing LE self-care essential to retaining clinical gains and limiting progression. Pt and OT discussed need to have daily caregiver assistance with lymphedema self-care home program, especially compression bandaging, due to hx of SOB and difficulty reaching distal legs and feet. Without daily CG assistance with compression bandaging prognosis is guarded.    Person(s) Educated Patient    Methods Explanation;Demonstration;Handout    Comprehension Verbalized understanding;Returned demonstration;Need further instruction                 OT Long Term Goals - 05/06/21 0916       OT LONG TERM GOAL #1   Title Patient's intake functional score on the FOTO tool is 12 out of 100 (higher number = greater function). Given this patient's comorbidities she will experience at least an increase in function of 6 points, or higher, to increase level of independence with basic ADLs and functional ambulation and mobility.    Baseline 12/100    Time 12    Period Weeks    Status New    Target Date 08/04/21      OT LONG TERM GOAL #2   Title Pt will be able to apply multi-layer, short stretch compression wraps to one leg at a time using correct gradient techniques with maximum caregiver assistance in an effort  to return the affected  limb(s)  to premorbid size and shape, to limit pain and infection risk, and to improve functional mobility and ambulation  for ADLs.    Baseline dependent    Time 4    Period Days    Status New    Target Date --   4th OT Rx visit. CG must be present     OT LONG TERM GOAL #3   Title Pt verbalize  4 primary lymphedema  precautions and prevention strategies using printed reference (modified independence) to reduce infection risk limit progression of lymphedema.    Baseline Max A    Time 4    Period Days    Status New    Target Date --   4th OT Rx visit     OT LONG TERM GOAL #4   Title Pt will achieve at least 10% limb volume reduction  bilaterally during Intensive Phase CDT to prevent re-accumulation of lymphatic congestion and progression of fibrosis, to limit infection risk, to improve functional ambulation and transfers, and to improve functional performance of ADLs.    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  Lipedema typically affects women and is characterized by painful, symmetrical swelling in the legs, thighs and buttocks, and sometimes the arms, due to the  disproportionate deposition of fat in the lower quadrants. Accompanying obesity can favor the development of lipo-lymphedema. As lipedema progresses fibrotic fat accumulation puts pressure on delicate lymphatics resulting in excessive lymph accumulation in these fatty tissues. Kristine Rangel reports the onset of bilateral leg swelling many years ago. She endorses positive family history of bilateral leg swelling in her grandfather. In Kristine Rangel's case, edematous fat is distributed from her waist to below the knees bilaterally.  Chronic, progressive leg swelling and associated pain limits this Pt's functional ambulation and mobility, and ability to perform functional activities in all occupational domains, including basic and instrumental ADLs, productive activities and leisure pursuits, social participation and role performance. Appearance of swollen legs limits her body image. Kristine Rangel will benefit from skilled Occupational Therapy for Intensive and Management Phase Complete Decongestive Therapy (CDT) to include manual lymphatic drainage (MLD), skin care, therapeutic exercise and compression therapy. Emphasis throughout OT course will also focus on Pt education for long term LE self-care. Without skilled OT for CDT LE will progress  resulting in progression of the condition, increased infection risk and further functional decline.    OT Occupational Profile and History Comprehensive Assessment- Review of records and extensive additional review of physical, cognitive, psychosocial history related to current functional performance    Occupational performance deficits (Please refer to evaluation for details): ADL's;IADL's;Rest and Sleep;Work;Leisure;Social Participation    Body Structure / Function / Physical Skills ADL;Edema;Skin integrity;Mobility;Pain;Decreased knowledge of precautions;Decreased knowledge of use of DME;IADL;Balance;Endurance;Strength;UE functional use;Obesity;Vision;Gait;ROM;Sensation    Rehab Potential Fair    Clinical Decision Making Several treatment options, min-mod task modification necessary    Comorbidities Affecting Occupational Performance: Presence of comorbidities impacting occupational performance    Comorbidities impacting occupational performance description: Suspected lipedema. Possible CVI. Comorbidities that contribute to systemic fluid retention and lymphatic overload  include CKD, CHF, HTN, OSA, morbid obesity, chronic joint inflammation    Modification or Assistance to Complete Evaluation  Min-Moderate modification of tasks or assist with assess necessary to complete eval    OT Frequency 2x / week    OT Duration 12 weeks   and PRN.   OT Treatment/Interventions Self-care/ADL training;Therapeutic exercise;Manual Therapy;Energy conservation;Manual lymph drainage;Therapeutic activities;DME and/or AE instruction;Compression bandaging;Other (comment);Patient/family education   Fit with custom daytime compression garments and HOS devices designed to limit lympphedema progression and facilitate increased lymphatic circulation  during hours of sleep   Plan Intensive and Self-Management Complete Decongestive Therapy (CDT): Treat one leg at a time only to limit excessive fluid return to the heart and to  limit falls risk. Provide Manual lymphatic Drainage (MLD), skin care with low ph lotion/ oil to increase skin hydrarion and mobility, therapeutic lymphatic pumping ther ex and multilayer gradient compression wraps to reduce limb volume. Lastly, fit appropriate compression garments to retain clinic al gains.    Recommended Other Services If cardiology is in agreement, fit with advanced sequential pneumatic compression device, or Flexitouch to decongest lymphatic fluid build up in fatty tissues under the skin.This device wil enable Pt to manage lipo-lymphedema at home over time with fluid decongestion and pain relief. OT to arrange trial with manufacturer's rep is in agreement.    Consulted and Agree with Plan of Care Patient             Patient will benefit from skilled therapeutic intervention in order to improve the following deficits and impairments:   Body Structure / Function / Physical Skills: ADL, Edema, Skin integrity, Mobility, Pain, Decreased knowledge of precautions, Decreased knowledge of use  of DME, IADL, Balance, Endurance, Strength, UE functional use, Obesity, Vision, Gait, ROM, Sensation       Visit Diagnosis: Lymphedema, not elsewhere classified - Plan: Ot plan of care cert/re-cert    Problem List Patient Active Problem List   Diagnosis Date Noted   Chest pain 02/05/2021   SOB (shortness of breath) 02/05/2021   Leg swelling 02/05/2021   Nonrheumatic mitral valve regurgitation 02/05/2021   Morbid obesity (HCC) 02/05/2021   S/P revision of total knee, right 05/05/2020   S/P right rotator cuff repair 07/30/2018   S/P left rotator cuff repair 11/23/2016   Benign essential hypertension 02/05/2003   Loel Dubonnet, MS, OTR/L, [facility] 05/06/21 10:03 AM   [place] [facility] MAIN [facility] SERVICES [address] [place], [place], [address] Phone: [phone]   Fax:  [phone]  Name: Kristine Rangel MRN: [id number] Date of Birth:  Oct 11, 1956

## 2021-05-09 ENCOUNTER — Ambulatory Visit: Payer: Medicare HMO | Admitting: Occupational Therapy

## 2021-05-12 ENCOUNTER — Encounter: Payer: Self-pay | Admitting: Occupational Therapy

## 2021-05-12 ENCOUNTER — Other Ambulatory Visit: Payer: Self-pay

## 2021-05-12 ENCOUNTER — Ambulatory Visit: Payer: Medicare HMO | Admitting: Occupational Therapy

## 2021-05-12 DIAGNOSIS — I890 Lymphedema, not elsewhere classified: Secondary | ICD-10-CM | POA: Diagnosis not present

## 2021-05-12 NOTE — Therapy (Signed)
[facility] [address] [place], [place], [address] Phone: [phone]   Fax:  [phone]  Occupational Therapy Treatment  Patient Details  Name: Kristine DrossCathy A Rangel MRN: [id number] Date of Birth: 1956-08-21 Referring Provider (OT): Lennon AlstromJacquelyn D Visser, PA-C   Encounter Date: 05/12/2021   OT End of Session - 05/12/21 1018     Visit Number 2    Number of Visits 24   Date for OT Re-Evaluation 08/03/21    OT Start Time 1002    OT Stop Time 1120    OT Time Calculation (min) 78 min    Activity Tolerance Patient tolerated treatment well;No increased pain    Behavior During Therapy WFL for tasks assessed/performed             Past Medical History:  Diagnosis Date   Anxiety    Arthritis    Asthma    Bladder leak    Cerebral aneurysm    history-has coils   CHF (congestive heart failure) (HCC)    Chronic kidney disease    COPD (chronic obstructive pulmonary disease) (HCC)    Depression    Diabetes mellitus without complication (HCC)    Dyspnea    GERD (gastroesophageal reflux disease)    Glaucoma    Headache    History of kidney stones    Hyperlipidemia    Hypertension    MI (myocardial infarction) (HCC)    unsure when   Neuropathy    Rotator cuff injury    Seasonal allergies    Stroke [facility](HCC)    TIA's    Past Surgical History:  Procedure Laterality Date   BRAIN SURGERY Right 2010   anuerysm repair   CARPAL TUNNEL RELEASE Right 06/12/2017   Procedure: CARPAL TUNNEL RELEASE;  Surgeon: Juanell FairlyKrasinski, Kevin, MD;  Location: [facility] ORS;  Service: Orthopedics;  Laterality: Right;   CHOLECYSTECTOMY     JOINT REPLACEMENT Bilateral    TOTAL KNEE REPLACEMENT   SHOULDER ARTHROSCOPY WITH OPEN ROTATOR CUFF REPAIR Left 11/23/2016   Procedure: SHOULDER ARTHROSCOPY WITH OPEN ROTATOR CUFF REPAIR, ARTHROSCOPIC SUBACROMIAL DECOMPRESSION, DISTAL CLAVICLE EXCISION;  Surgeon: Juanell FairlyKevin Krasinski, MD;  Location: [facility] ORS;  Service:  Orthopedics;  Laterality: Left;   SHOULDER ARTHROSCOPY WITH OPEN ROTATOR CUFF REPAIR Right 07/30/2018   Procedure: SHOULDER ARTHROSCOPY WITH OPEN ROTATOR CUFF REPAIR,SUBACROMINAL DECOMPRESSION AND DISTAL CLAVICLE EXCISION;  Surgeon: Juanell FairlyKrasinski, Kevin, MD;  Location: [facility] ORS;  Service: Orthopedics;  Laterality: Right;   TOTAL KNEE REVISION Right 05/05/2020   Procedure: right total knee arthroplasty revision;  Surgeon: Lyndle HerrlichBowers, James R, MD;  Location: [facility] ORS;  Service: Orthopedics;  Laterality: Right;    There were no vitals filed for this visit.   Subjective Assessment - 05/12/21 1019     Subjective  [name] presents for OT Rx visit 2/24visits  ([facility]) to address bilateral lower extremity lipo-lymphedema. Pt reports 8/10 RLE pain from "my knee to bottom of my feet". Pt's paid caregiver is not available today as planned as she is unwell and had to go ED. Pt has arranged for assistance with LE home program, specifically for compression wrapping, but we will proceed with day 1 as typical, but without CG edu compoent. Hopefully we can get started with that at next session.    Pertinent History OA knees and back,  OSA, HTN, morbid obesity (BMI 50.0-59.9) , hx anxiety and depression, hx cereberal aneurism,  glaucoma, hx MI, neuropathy, B rotator cuff repairs, Hx TIAs.  FOLLOW LYMPHEDREMA PRECAUTIONS FOR diastolic dysfunction, asthma, CKD, dyspnea, CHF, COPD DM    Limitations difficulty walking, impaired standing balance, dyspnea, decreased dynamic balance, decreased LB strength, decreased A/PROM 2/2 body habitus and skin approximation,  increased supportive care needs and decreased quality of life, impaired body image    Repetition Increases Symptoms    Patient Stated Goals "Get these legs straightened out make it not hurt so bad so I can do more."    Pain Onset More than a month ago   RTK 2016. S/P revision 2021. Chronic pain started ~ 2 yrs before revision and persists today                 LYMPHEDEMA/ONCOLOGY QUESTIONNAIRE - 05/12/21 1128       Lymphedema Assessments   Lymphedema Assessments Lower extremities      Right Lower Extremity Lymphedema   Other RLE (dominant) limb volume from ankle to popliteal (A-D)= 5870.5 ml. RLE thigh volume from knee to groin (E-G)= 11486.4 ml. RLE full limb volume from ankle to groin (A-G) = 17356.9 ml    Other Leg (A-D) limb volume differential (LVD) = 13.8%, R>L. Thigh (E-G) LVD = 12.76%, L>R. Full limb (A-G) LVD = 3.8%, L>R.      Left Lower Extremity Lymphedema   Other LLE  limb volume from ankle to popliteal (A-D)= 5060.0  ml. LLE thigh volume from knee to groin (E-G)= 12952.4 ml. LLE full limb volume from ankle to groin (A-G) = 18012.3 ml                     OT Treatments/Exercises (OP) - 05/12/21 1127       ADLs   ADL Education Given Yes      Manual Therapy   Manual Therapy Edema management    Manual therapy comments Lymphatic pumping ther ex    Edema Management Initial BLE comparative limb volumetrics A-G                    OT Education - 05/12/21 1139     Education Details Provided posture edu while walking to clinic w walker to reduce excessive shoulder elevation and to ensure correct spine alignment. Shoulder elevation appears to be quite habitual, so will continue this each session. Will also consider reducing walker handle height. Continued Pt/ CG edu for lymphedema self care  and home program throughout session. Topics include multilayer, gradient compression wrapping, simple self-MLD, therapeutic lymphatic pumping exercises, skin/nail care, risk reduction factors and LE precautions, compression garments/recommendations and wear and care schedule and compression garment donning / doffing using assistive devices. All questions answered to the Pt's satisfaction, and Pt demonstrates understanding by report.    Person(s) Educated Patient    Methods Explanation;Demonstration;Handout     Comprehension Verbalized understanding;Returned demonstration;Need further instruction                 OT Long Term Goals - 05/06/21 0916       OT LONG TERM GOAL #1   Title Patient's intake functional score on the FOTO tool is 12 out of 100 (higher number = greater function). Given this patient's comorbidities she will experience at least an increase in function of 6 points, or higher, to increase level of independence with basic ADLs and functional ambulation and mobility.    Baseline 12/100    Time 12    Period Weeks    Status New    Target Date 08/04/21  OT LONG TERM GOAL #2   Title Pt will be able to apply multi-layer, short stretch compression wraps to one leg at a time using correct gradient techniques with maximum caregiver assistance in an effort  to return the affected  limb(s)  to premorbid size and shape, to limit pain and infection risk, and to improve functional mobility and ambulation  for ADLs.    Baseline dependent    Time 4    Period Days    Status New    Target Date --   4th OT Rx visit. CG must be present     OT LONG TERM GOAL #3   Title Pt verbalize  4 primary lymphedema  precautions and prevention strategies using printed reference (modified independence) to reduce infection risk limit progression of lymphedema.    Baseline Max A    Time 4    Period Days    Status New    Target Date --   4th OT Rx visit     OT LONG TERM GOAL #4   Title Pt will achieve at least 10% limb volume reduction bilaterally during Intensive Phase CDT to prevent re-accumulation of lymphatic congestion and progression of fibrosis, to limit infection risk, to improve functional ambulation and transfers, and to improve functional performance of ADLs.    Baseline dependent    Time 12    Period Weeks    Status New    Target Date 08/04/21                   Plan - 05/12/21 1135     Clinical Impression Statement Pt arrives without CG today who was ill and unable to  attend session. Completed day 1 Rx protocols without compression wrapping as Pt will not return his week due to long holiday weekend. Also unsure of caregiver issues over the long weekend. We'll commence multilayer wrapping to RLE next visit . Provided posture edu while walking to clinic w walker to reduce excessive shoulder elevation and to ensure correct spine alignment. Shoulder elevation appears to be quite habitual, so will continue this each session. Will also consider reducing walker handle height. Completed BLE comparative limb voluemtrics. Initially RLE (dominant) limb volume from ankle to popliteal (A-D)= 5870.5 ml. RLE thigh volume from knee to groin (E-G)= 11486.4 ml. RLE full limb volume from ankle to groin (A-G) = 17356.9 ml. Taught Pt BLE lymphatic pumping ther ex. Pt able to perform all in correct sequence with excellent return. Handout provided and Pt instructed to perform 2 x daily. Cont as per POC. We're off to a great start.    OT Occupational Profile and History Comprehensive Assessment- Review of records and extensive additional review of physical, cognitive, psychosocial history related to current functional performance    Occupational performance deficits (Please refer to evaluation for details): ADL's;IADL's;Rest and Sleep;Work;Leisure;Social Participation    Body Structure / Function / Physical Skills ADL;Edema;Skin integrity;Mobility;Pain;Decreased knowledge of precautions;Decreased knowledge of use of DME;IADL;Balance;Endurance;Strength;UE functional use;Obesity;Vision;Gait;ROM;Sensation    Rehab Potential Fair    Clinical Decision Making Several treatment options, min-mod task modification necessary    Comorbidities Affecting Occupational Performance: Presence of comorbidities impacting occupational performance    Comorbidities impacting occupational performance description: Suspected lipedema. Possible CVI. Comorbidities that contribute to systemic fluid retention and lymphatic  overload  include CKD, CHF, HTN, OSA, morbid obesity, chronic joint inflammation    Modification or Assistance to Complete Evaluation  Min-Moderate modification of tasks or assist with assess necessary to complete eval  OT Frequency 2x / week    OT Duration 12 weeks   and PRN.   OT Treatment/Interventions Self-care/ADL training;Therapeutic exercise;Manual Therapy;Energy conservation;Manual lymph drainage;Therapeutic activities;DME and/or AE instruction;Compression bandaging;Other (comment);Patient/family education   Fit with custom daytime compression garments and HOS devices designed to limit lympphedema progression and facilitate increased lymphatic circulation  during hours of sleep   Plan Intensive and Self-Management Complete Decongestive Therapy (CDT): Treat one leg at a time only to limit excessive fluid return to the heart and to limit falls risk. Provide Manual lymphatic Drainage (MLD), skin care with low ph lotion/ oil to increase skin hydrarion and mobility, therapeutic lymphatic pumping ther ex and multilayer gradient compression wraps to reduce limb volume. Lastly, fit appropriate compression garments to retain clinic al gains.    Recommended Other Services If cardiology is in agreement, fit with advanced sequential pneumatic compression device, or Flexitouch to decongest lymphatic fluid build up in fatty tissues under the skin.This device wil enable Pt to manage lipo-lymphedema at home over time with fluid decongestion and pain relief. OT to arrange trial with manufacturer's rep is in agreement.    Consulted and Agree with Plan of Care Patient             Patient will benefit from skilled therapeutic intervention in order to improve the following deficits and impairments:   Body Structure / Function / Physical Skills: ADL, Edema, Skin integrity, Mobility, Pain, Decreased knowledge of precautions, Decreased knowledge of use of DME, IADL, Balance, Endurance, Strength, UE functional use,  Obesity, Vision, Gait, ROM, Sensation       Visit Diagnosis: Lymphedema, not elsewhere classified    Problem List Patient Active Problem List   Diagnosis Date Noted   Chest pain 02/05/2021   SOB (shortness of breath) 02/05/2021   Leg swelling 02/05/2021   Nonrheumatic mitral valve regurgitation 02/05/2021   Morbid obesity (HCC) 02/05/2021   S/P revision of total knee, right 05/05/2020   S/P right rotator cuff repair 07/30/2018   S/P left rotator cuff repair 11/23/2016   Benign essential hypertension 02/05/2003    Loel Dubonnet, MS, OTR/L, [facility] 05/12/21 11:45 AM   [facility] MAIN [facility] SERVICES [address] [place], [place], [address] Phone: [phone]   Fax:  [phone]  Name: Kristine Rangel MRN: [id number] Date of Birth: 1956/04/30

## 2021-05-19 ENCOUNTER — Ambulatory Visit: Payer: Medicare HMO | Attending: Physician Assistant | Admitting: Occupational Therapy

## 2021-05-19 ENCOUNTER — Other Ambulatory Visit: Payer: Self-pay

## 2021-05-19 DIAGNOSIS — I890 Lymphedema, not elsewhere classified: Secondary | ICD-10-CM | POA: Insufficient documentation

## 2021-05-20 NOTE — Patient Instructions (Signed)
Lymphedema Self- Care Instructions  1. EXERCISE: Perform lymphatic pumping there ex 2 x a day. While wearing your compression wraps or garments. Perform 10 reps of each exercise bilaterally and be sure to perform them in order. Don;t skip around!  OMIT PARTIAL SIT UPs.  2. MLD: Perform simple self-Manual Lymphatic Drainage (MLD) at least once a day as directed.  3. If you have a Flexitouch advanced "pump" use it 1 time each day on a single limb only. The Flexitouch moves lymphatic fluid out of your affected body part and back to your heart, so DO NOT use the Flexi on 2 legs at a time, and DO NOT ues it on 2 legs on the same day. If you experience any atypical shortness of breath, sudden onset of pain, or feelings of heart arhythmia, or racing, discontinue use of the Flexitouch and report these symptoms to your doctor right away.  4. WRAPS: Compression wraps are to be worn 23 hrs/ 7 days/wk during Intensive Phase of Complete Decongestive Therapy (CDT).Building tolerance may take time and practice, so don't get discouraged. If bandages begin to feel tight during periods of inactivity and/or during the night, try performing your exercises to loosen them.   5. GARMENTS: During Management Phase CDT your compression garments are to be worn during waking hours when active. Do NOT sleep in your garments!!   6. PUT YOUR FEET UP! Elevate your feet and legs and feet to the level of your heart whenever you are sitting down.   7. SKIN: Carefully monitor skin condition and perform impeccable hygiene daily. Bathe skin with mild soap and water and apply low pH lotion (aka Eucerin ) to improve hydration and limit infection risk.   

## 2021-05-20 NOTE — Therapy (Signed)
[facility] MAIN [facility] SERVICES [address] [place], [place], [address] Phone: [phone]   Fax:  [phone]  Occupational Therapy Treatment  Patient Details  Name: Kristine Rangel MRN: [id number] Date of Birth: Apr 05, 1956 Referring Provider (OT): Lennon Alstrom, PA-C   Encounter Date: 05/19/2021   OT End of Session - 05/19/21 1124     Visit Number 3    Number of Visits 36    Date for OT Re-Evaluation 08/03/21    OT Start Time 1110    OT Stop Time 1215    OT Time Calculation (min) 65 min    Activity Tolerance Patient tolerated treatment well;No increased pain    Behavior During Therapy WFL for tasks assessed/performed             Past Medical History:  Diagnosis Date   Anxiety    Arthritis    Asthma    Bladder leak    Cerebral aneurysm    history-has coils   CHF (congestive heart failure) (HCC)    Chronic kidney disease    COPD (chronic obstructive pulmonary disease) (HCC)    Depression    Diabetes mellitus without complication (HCC)    Dyspnea    GERD (gastroesophageal reflux disease)    Glaucoma    Headache    History of kidney stones    Hyperlipidemia    Hypertension    MI (myocardial infarction) (HCC)    unsure when   Neuropathy    Rotator cuff injury    Seasonal allergies    Stroke [facility])    TIA's    Past Surgical History:  Procedure Laterality Date   BRAIN SURGERY Right 2010   anuerysm repair   CARPAL TUNNEL RELEASE Right 06/12/2017   Procedure: CARPAL TUNNEL RELEASE;  Surgeon: Juanell Fairly, MD;  Location: [facility] ORS;  Service: Orthopedics;  Laterality: Right;   CHOLECYSTECTOMY     JOINT REPLACEMENT Bilateral    TOTAL KNEE REPLACEMENT   SHOULDER ARTHROSCOPY WITH OPEN ROTATOR CUFF REPAIR Left 11/23/2016   Procedure: SHOULDER ARTHROSCOPY WITH OPEN ROTATOR CUFF REPAIR, ARTHROSCOPIC SUBACROMIAL DECOMPRESSION, DISTAL CLAVICLE EXCISION;  Surgeon: Juanell Fairly, MD;  Location: [facility] ORS;  Service:  Orthopedics;  Laterality: Left;   SHOULDER ARTHROSCOPY WITH OPEN ROTATOR CUFF REPAIR Right 07/30/2018   Procedure: SHOULDER ARTHROSCOPY WITH OPEN ROTATOR CUFF REPAIR,SUBACROMINAL DECOMPRESSION AND DISTAL CLAVICLE EXCISION;  Surgeon: Juanell Fairly, MD;  Location: [facility] ORS;  Service: Orthopedics;  Laterality: Right;   TOTAL KNEE REVISION Right 05/05/2020   Procedure: right total knee arthroplasty revision;  Surgeon: Lyndle Herrlich, MD;  Location: [facility] ORS;  Service: Orthopedics;  Laterality: Right;    There were no vitals filed for this visit.   Subjective Assessment - 05/19/21 1125     Subjective  Kristine Rangel presents for OT Rx visit 3/24visits  (authorized  by [facility]) to address bilateral lower extremity lipo-lymphedema. Pt reports 10/10 RLE pain  in "my whole legs". Pt's caregiver is here today to learn how to assist Kristine Rangel with compression wraps between visits at home.    Pertinent History OA knees and back,  OSA, HTN, morbid obesity (BMI 50.0-59.9) , hx anxiety and depression, hx cereberal aneurism,  glaucoma, hx MI, neuropathy, B rotator cuff repairs, Hx TIAs. FOLLOW LYMPHEDREMA PRECAUTIONS FOR diastolic dysfunction, asthma, CKD, dyspnea, CHF, COPD DM    Limitations difficulty walking, impaired standing balance, dyspnea, decreased dynamic balance, decreased LB strength, decreased A/PROM 2/2 body habitus and skin approximation,  increased  supportive care needs and decreased quality of life, impaired body image    Repetition Increases Symptoms    Patient Stated Goals "Get these legs straightened out make it not hurt so bad so I can do more."    Pain Onset More than a month ago   RTK 2016. S/P revision 2021. Chronic pain started ~ 2 yrs before revision and persists today                                 OT Education - 05/20/21 0942     Education Details Pt and CG edu for multilayer gradient compression wrapping to lower extremity below the knee.    Person(s)  Educated Patient    Methods Explanation;Demonstration;Handout    Comprehension Verbalized understanding;Returned demonstration;Need further instruction                 OT Long Term Goals - 05/06/21 0916       OT LONG TERM GOAL #1   Title Patient's intake functional score on the FOTO tool is 12 out of 100 (higher number = greater function). Given this patient's comorbidities she will experience at least an increase in function of 6 points, or higher, to increase level of independence with basic ADLs and functional ambulation and mobility.    Baseline 12/100    Time 12    Period Weeks    Status New    Target Date 08/04/21      OT LONG TERM GOAL #2   Title Pt will be able to apply multi-layer, short stretch compression wraps to one leg at a time using correct gradient techniques with maximum caregiver assistance in an effort  to return the affected  limb(s)  to premorbid size and shape, to limit pain and infection risk, and to improve functional mobility and ambulation  for ADLs.    Baseline dependent    Time 4    Period Days    Status New    Target Date --   4th OT Rx visit. CG must be present     OT LONG TERM GOAL #3   Title Pt verbalize  4 primary lymphedema  precautions and prevention strategies using printed reference (modified independence) to reduce infection risk limit progression of lymphedema.    Baseline Max A    Time 4    Period Days    Status New    Target Date --   4th OT Rx visit     OT LONG TERM GOAL #4   Title Pt will achieve at least 10% limb volume reduction bilaterally during Intensive Phase CDT to prevent re-accumulation of lymphatic congestion and progression of fibrosis, to limit infection risk, to improve functional ambulation and transfers, and to improve functional performance of ADLs.    Baseline dependent    Time 12    Period Weeks    Status New    Target Date 08/04/21                   Plan - 05/19/21 3382     Clinical Impression  Statement Visit devoted to Pt and CG education with emphasis on  teaching knee length, multi-layer, gradient compression wrapping to the RLE. Afdter skilled teaching CG able to apply wraps using correct techniques with moderate assistance. Pt able to spontaneously cue CG to make some adjustments to wraps. Excellent return on first day of teaching this complex process. Next  visit teach wrapping to alternate caregiver. Cont as per POC.    OT Occupational Profile and History Comprehensive Assessment- Review of records and extensive additional review of physical, cognitive, psychosocial history related to current functional performance    Occupational performance deficits (Please refer to evaluation for details): ADL's;IADL's;Rest and Sleep;Work;Leisure;Social Participation    Body Structure / Function / Physical Skills ADL;Edema;Skin integrity;Mobility;Pain;Decreased knowledge of precautions;Decreased knowledge of use of DME;IADL;Balance;Endurance;Strength;UE functional use;Obesity;Vision;Gait;ROM;Sensation    Rehab Potential Fair    Clinical Decision Making Several treatment options, min-mod task modification necessary    Comorbidities Affecting Occupational Performance: Presence of comorbidities impacting occupational performance    Comorbidities impacting occupational performance description: Suspected lipedema. Possible CVI. Comorbidities that contribute to systemic fluid retention and lymphatic overload  include CKD, CHF, HTN, OSA, morbid obesity, chronic joint inflammation    Modification or Assistance to Complete Evaluation  Min-Moderate modification of tasks or assist with assess necessary to complete eval    OT Frequency 2x / week    OT Duration 12 weeks   and PRN.   OT Treatment/Interventions Self-care/ADL training;Therapeutic exercise;Manual Therapy;Energy conservation;Manual lymph drainage;Therapeutic activities;DME and/or AE instruction;Compression bandaging;Other (comment);Patient/family  education   Fit with custom daytime compression garments and HOS devices designed to limit lympphedema progression and facilitate increased lymphatic circulation  during hours of sleep   Plan Intensive and Self-Management Complete Decongestive Therapy (CDT): Treat one leg at a time only to limit excessive fluid return to the heart and to limit falls risk. Provide Manual lymphatic Drainage (MLD), skin care with low ph lotion/ oil to increase skin hydrarion and mobility, therapeutic lymphatic pumping ther ex and multilayer gradient compression wraps to reduce limb volume. Lastly, fit appropriate compression garments to retain clinic al gains.    Recommended Other Services If cardiology is in agreement, fit with advanced sequential pneumatic compression device, or Flexitouch to decongest lymphatic fluid build up in fatty tissues under the skin.This device wil enable Pt to manage lipo-lymphedema at home over time with fluid decongestion and pain relief. OT to arrange trial with manufacturer's rep is in agreement.    Consulted and Agree with Plan of Care Patient             Patient will benefit from skilled therapeutic intervention in order to improve the following deficits and impairments:   Body Structure / Function / Physical Skills: ADL, Edema, Skin integrity, Mobility, Pain, Decreased knowledge of precautions, Decreased knowledge of use of DME, IADL, Balance, Endurance, Strength, UE functional use, Obesity, Vision, Gait, ROM, Sensation       Visit Diagnosis: Lymphedema, not elsewhere classified    Problem List Patient Active Problem List   Diagnosis Date Noted   Chest pain 02/05/2021   SOB (shortness of breath) 02/05/2021   Leg swelling 02/05/2021   Nonrheumatic mitral valve regurgitation 02/05/2021   Morbid obesity (HCC) 02/05/2021   S/P revision of total knee, right 05/05/2020   S/P right rotator cuff repair 07/30/2018   S/P left rotator cuff repair 11/23/2016   Benign essential  hypertension 02/05/2003    Loel Dubonnet, Kristine, OTR/L, [facility] 05/20/21 9:44 AM   [facility] MAIN [facility] SERVICES [address] [place], [place], [address] Phone: [phone]   Fax:  [phone]  Name: Kristine Rangel MRN: [id number] Date of Birth: 1956-06-22

## 2021-05-25 ENCOUNTER — Other Ambulatory Visit: Payer: Self-pay

## 2021-05-25 ENCOUNTER — Ambulatory Visit: Payer: Medicare HMO | Admitting: Occupational Therapy

## 2021-05-25 DIAGNOSIS — I890 Lymphedema, not elsewhere classified: Secondary | ICD-10-CM | POA: Diagnosis not present

## 2021-05-25 NOTE — Therapy (Signed)
[facility] MAIN [facility] SERVICES [address] [place], [place], [address] Phone: [phone]   Fax:  [phone]  Occupational Therapy Treatment  Patient Details  Name: Kristine Rangel MRN: [id number] Date of Birth: 07/10/1956 Referring Provider (OT): Lennon Alstrom, PA-C   Encounter Date: 05/25/2021   OT End of Session - 05/25/21 1114     Visit Number 4    Number of Visits 36    Date for OT Re-Evaluation 08/03/21    OT Start Time 1103    OT Stop Time 1206    OT Time Calculation (min) 63 min    Activity Tolerance Patient tolerated treatment well;No increased pain    Behavior During Therapy WFL for tasks assessed/performed             Past Medical History:  Diagnosis Date   Anxiety    Arthritis    Asthma    Bladder leak    Cerebral aneurysm    history-has coils   CHF (congestive heart failure) (HCC)    Chronic kidney disease    COPD (chronic obstructive pulmonary disease) (HCC)    Depression    Diabetes mellitus without complication (HCC)    Dyspnea    GERD (gastroesophageal reflux disease)    Glaucoma    Headache    History of kidney stones    Hyperlipidemia    Hypertension    MI (myocardial infarction) (HCC)    unsure when   Neuropathy    Rotator cuff injury    Seasonal allergies    Stroke [facility])    TIA's    Past Surgical History:  Procedure Laterality Date   BRAIN SURGERY Right 2010   anuerysm repair   CARPAL TUNNEL RELEASE Right 06/12/2017   Procedure: CARPAL TUNNEL RELEASE;  Surgeon: Juanell Fairly, MD;  Location: [facility] ORS;  Service: Orthopedics;  Laterality: Right;   CHOLECYSTECTOMY     JOINT REPLACEMENT Bilateral    TOTAL KNEE REPLACEMENT   SHOULDER ARTHROSCOPY WITH OPEN ROTATOR CUFF REPAIR Left 11/23/2016   Procedure: SHOULDER ARTHROSCOPY WITH OPEN ROTATOR CUFF REPAIR, ARTHROSCOPIC SUBACROMIAL DECOMPRESSION, DISTAL CLAVICLE EXCISION;  Surgeon: Juanell Fairly, MD;  Location: [facility] ORS;  Service:  Orthopedics;  Laterality: Left;   SHOULDER ARTHROSCOPY WITH OPEN ROTATOR CUFF REPAIR Right 07/30/2018   Procedure: SHOULDER ARTHROSCOPY WITH OPEN ROTATOR CUFF REPAIR,SUBACROMINAL DECOMPRESSION AND DISTAL CLAVICLE EXCISION;  Surgeon: Juanell Fairly, MD;  Location: [facility] ORS;  Service: Orthopedics;  Laterality: Right;   TOTAL KNEE REVISION Right 05/05/2020   Procedure: right total knee arthroplasty revision;  Surgeon: Lyndle Herrlich, MD;  Location: [facility] ORS;  Service: Orthopedics;  Laterality: Right;    There were no vitals filed for this visit.   Subjective Assessment - 05/25/21 1114     Subjective  Kristine Rangel presents for OT Rx visit 3/24visits  (authorized  by [facility]) to address bilateral lower extremity lipo-lymphedema. Pt reports 3/10 LE related leg pain. Second paid caregiver, Jackelyn Poling, is here today to learn gradient compression wrapping to assist Pt between OT visits.    Pertinent History OA knees and back,  OSA, HTN, morbid obesity (BMI 50.0-59.9) , hx anxiety and depression, hx cereberal aneurism,  glaucoma, hx MI, neuropathy, B rotator cuff repairs, Hx TIAs. FOLLOW LYMPHEDREMA PRECAUTIONS FOR diastolic dysfunction, asthma, CKD, dyspnea, CHF, COPD DM    Limitations difficulty walking, impaired standing balance, dyspnea, decreased dynamic balance, decreased LB strength, decreased A/PROM 2/2 body habitus and skin approximation,  increased supportive care needs and  decreased quality of life, impaired body image    Repetition Increases Symptoms    Patient Stated Goals "Get these legs straightened out make it not hurt so bad so I can do more."    Pain Onset More than a month ago   RTK 2016. S/P revision 2021. Chronic pain started ~ 2 yrs before revision and persists today                         OT Treatments/Exercises (OP) - 05/25/21 1237       ADLs   ADL Education Given Yes      Manual Therapy   Manual Therapy Edema management;Compression Bandaging;Manual  Lymphatic Drainage (MLD)    Manual therapy comments Lymphatic pumping ther ex    Manual Lymphatic Drainage (MLD) MLD to RLE utilizing short neck sequence, deep abdominal pathway, functional inguinal regional LN and proximal to distal J strokes covering thigh, knee and leg in sequence.    Compression Bandaging 3 layer gradietn compression wrap  from base of toes to tibial tuberosity using 10 and 12 " wide short stretch bandages over 0.4 cm thick, single layer of Rosidal fam over cotton stockinett.                    OT Education - 05/25/21 1239     Education Details Pt and CG edu for multilayer gradient compression wrapping to lower extremity below the knee.    Person(s) Educated Patient    Methods Explanation;Demonstration;Handout    Comprehension Verbalized understanding;Returned demonstration;Need further instruction                 OT Long Term Goals - 05/06/21 0916       OT LONG TERM GOAL #1   Title Patient's intake functional score on the FOTO tool is 12 out of 100 (higher number = greater function). Given this patient's comorbidities she will experience at least an increase in function of 6 points, or higher, to increase level of independence with basic ADLs and functional ambulation and mobility.    Baseline 12/100    Time 12    Period Weeks    Status New    Target Date 08/04/21      OT LONG TERM GOAL #2   Title Pt will be able to apply multi-layer, short stretch compression wraps to one leg at a time using correct gradient techniques with maximum caregiver assistance in an effort  to return the affected  limb(s)  to premorbid size and shape, to limit pain and infection risk, and to improve functional mobility and ambulation  for ADLs.    Baseline dependent    Time 4    Period Days    Status New    Target Date --   4th OT Rx visit. CG must be present     OT LONG TERM GOAL #3   Title Pt verbalize  4 primary lymphedema  precautions and prevention strategies  using printed reference (modified independence) to reduce infection risk limit progression of lymphedema.    Baseline Max A    Time 4    Period Days    Status New    Target Date --   4th OT Rx visit     OT LONG TERM GOAL #4   Title Pt will achieve at least 10% limb volume reduction bilaterally during Intensive Phase CDT to prevent re-accumulation of lymphatic congestion and progression of fibrosis, to limit infection risk,  to improve functional ambulation and transfers, and to improve functional performance of ADLs.    Baseline dependent    Time 12    Period Weeks    Status New    Target Date 08/04/21                   Plan - 05/25/21 1232     Clinical Impression Statement Upon visual assessment after removing compression wraps Pt demonstrates slight swelling reduction in leg below the knee to ankle. Pt states bandages provide support which feels good. Commenced MLD to RLE utilizing short neck sequence, deep abdominal pathway, functional inguinal regional LN and proximal to distal J strokes covering thigh, knee and leg in sequence. Good tolerance Pt reports analgesic effect. Reapplied compression wraps teaching 2nd paid caregiver gradient techniques with sh9rt stretch technoogy. Good return.    OT Occupational Profile and History Comprehensive Assessment- Review of records and extensive additional review of physical, cognitive, psychosocial history related to current functional performance    Occupational performance deficits (Please refer to evaluation for details): ADL's;IADL's;Rest and Sleep;Work;Leisure;Social Participation    Body Structure / Function / Physical Skills ADL;Edema;Skin integrity;Mobility;Pain;Decreased knowledge of precautions;Decreased knowledge of use of DME;IADL;Balance;Endurance;Strength;UE functional use;Obesity;Vision;Gait;ROM;Sensation    Rehab Potential Fair    Clinical Decision Making Several treatment options, min-mod task modification necessary     Comorbidities Affecting Occupational Performance: Presence of comorbidities impacting occupational performance    Comorbidities impacting occupational performance description: Suspected lipedema. Possible CVI. Comorbidities that contribute to systemic fluid retention and lymphatic overload  include CKD, CHF, HTN, OSA, morbid obesity, chronic joint inflammation    Modification or Assistance to Complete Evaluation  Min-Moderate modification of tasks or assist with assess necessary to complete eval    OT Frequency 2x / week    OT Duration 12 weeks   and PRN.   OT Treatment/Interventions Self-care/ADL training;Therapeutic exercise;Manual Therapy;Energy conservation;Manual lymph drainage;Therapeutic activities;DME and/or AE instruction;Compression bandaging;Other (comment);Patient/family education   Fit with custom daytime compression garments and HOS devices designed to limit lympphedema progression and facilitate increased lymphatic circulation  during hours of sleep   Plan Intensive and Self-Management Complete Decongestive Therapy (CDT): Treat one leg at a time only to limit excessive fluid return to the heart and to limit falls risk. Provide Manual lymphatic Drainage (MLD), skin care with low ph lotion/ oil to increase skin hydrarion and mobility, therapeutic lymphatic pumping ther ex and multilayer gradient compression wraps to reduce limb volume. Lastly, fit appropriate compression garments to retain clinic al gains.    Recommended Other Services If cardiology is in agreement, fit with advanced sequential pneumatic compression device, or Flexitouch to decongest lymphatic fluid build up in fatty tissues under the skin.This device wil enable Pt to manage lipo-lymphedema at home over time with fluid decongestion and pain relief. OT to arrange trial with manufacturer's rep is in agreement.    Consulted and Agree with Plan of Care Patient             Patient will benefit from skilled therapeutic  intervention in order to improve the following deficits and impairments:   Body Structure / Function / Physical Skills: ADL, Edema, Skin integrity, Mobility, Pain, Decreased knowledge of precautions, Decreased knowledge of use of DME, IADL, Balance, Endurance, Strength, UE functional use, Obesity, Vision, Gait, ROM, Sensation       Visit Diagnosis: Lymphedema, not elsewhere classified    Problem List Patient Active Problem List   Diagnosis Date Noted   Chest pain 02/05/2021  SOB (shortness of breath) 02/05/2021   Leg swelling 02/05/2021   Nonrheumatic mitral valve regurgitation 02/05/2021   Morbid obesity (HCC) 02/05/2021   S/P revision of total knee, right 05/05/2020   S/P right rotator cuff repair 07/30/2018   S/P left rotator cuff repair 11/23/2016   Benign essential hypertension 02/05/2003   Loel Dubonnetheresa Keean Wilmeth, Kristine, OTR/L, [facility] 05/25/21 12:40 PM   [place] [facility] [address] [place], [place], [id number] Phone: [phone]   Fax:  [phone]  Name: Kristine DrossCathy A Rangel MRN: [id number] Date of Birth: 1955/12/10

## 2021-05-25 NOTE — Patient Instructions (Signed)
Lymphedema Self- Care Instructions  1. EXERCISE: Perform lymphatic pumping there ex 2 x a day. While wearing your compression wraps or garments. Perform 10 reps of each exercise bilaterally and be sure to perform them in order. Don;t skip around!  OMIT PARTIAL SIT UPs.  2. MLD: Perform simple self-Manual Lymphatic Drainage (MLD) at least once a day as directed.  3. If you have a Flexitouch advanced "pump" use it 1 time each day on a single limb only. The Flexitouch moves lymphatic fluid out of your affected body part and back to your heart, so DO NOT use the Flexi on 2 legs at a time, and DO NOT ues it on 2 legs on the same day. If you experience any atypical shortness of breath, sudden onset of pain, or feelings of heart arhythmia, or racing, discontinue use of the Flexitouch and report these symptoms to your doctor right away.  4. WRAPS: Compression wraps are to be worn 23 hrs/ 7 days/wk during Intensive Phase of Complete Decongestive Therapy (CDT).Building tolerance may take time and practice, so don't get discouraged. If bandages begin to feel tight during periods of inactivity and/or during the night, try performing your exercises to loosen them.   5. GARMENTS: During Management Phase CDT your compression garments are to be worn during waking hours when active. Do NOT sleep in your garments!!   6. PUT YOUR FEET UP! Elevate your feet and legs and feet to the level of your heart whenever you are sitting down.   7. SKIN: Carefully monitor skin condition and perform impeccable hygiene daily. Bathe skin with mild soap and water and apply low pH lotion (aka Eucerin ) to improve hydration and limit infection risk.   

## 2021-05-30 ENCOUNTER — Encounter: Payer: Self-pay | Admitting: Family Medicine

## 2021-05-30 ENCOUNTER — Other Ambulatory Visit: Payer: Self-pay

## 2021-05-30 ENCOUNTER — Ambulatory Visit: Payer: Medicare HMO | Admitting: Occupational Therapy

## 2021-05-30 DIAGNOSIS — I890 Lymphedema, not elsewhere classified: Secondary | ICD-10-CM

## 2021-05-30 NOTE — Therapy (Signed)
[facility] MAIN [facility] SERVICES [address] [place], [place], [address] Phone: [phone]   Fax:  [phone]  Occupational Therapy Treatment  Patient Details  Name: Kristine Rangel MRN: [id number] Date of Birth: 07-31-1956 Referring Provider (OT): Lennon Alstrom, PA-C   Encounter Date: 05/30/2021   OT End of Session - 05/30/21 1240     Visit Number 5    Number of Visits 36    Date for OT Re-Evaluation 08/03/21    OT Start Time 1100    OT Stop Time 1200    OT Time Calculation (min) 60 min    Activity Tolerance Patient tolerated treatment well;No increased pain    Behavior During Therapy WFL for tasks assessed/performed             Past Medical History:  Diagnosis Date   Anxiety    Arthritis    Asthma    Bladder leak    Cerebral aneurysm    history-has coils   CHF (congestive heart failure) (HCC)    Chronic kidney disease    COPD (chronic obstructive pulmonary disease) (HCC)    Depression    Diabetes mellitus without complication (HCC)    Dyspnea    GERD (gastroesophageal reflux disease)    Glaucoma    Headache    History of kidney stones    Hyperlipidemia    Hypertension    MI (myocardial infarction) (HCC)    unsure when   Neuropathy    Rotator cuff injury    Seasonal allergies    Stroke [facility])    TIA's    Past Surgical History:  Procedure Laterality Date   BRAIN SURGERY Right 2010   anuerysm repair   CARPAL TUNNEL RELEASE Right 06/12/2017   Procedure: CARPAL TUNNEL RELEASE;  Surgeon: Juanell Fairly, MD;  Location: [facility] ORS;  Service: Orthopedics;  Laterality: Right;   CHOLECYSTECTOMY     JOINT REPLACEMENT Bilateral    TOTAL KNEE REPLACEMENT   SHOULDER ARTHROSCOPY WITH OPEN ROTATOR CUFF REPAIR Left 11/23/2016   Procedure: SHOULDER ARTHROSCOPY WITH OPEN ROTATOR CUFF REPAIR, ARTHROSCOPIC SUBACROMIAL DECOMPRESSION, DISTAL CLAVICLE EXCISION;  Surgeon: Juanell Fairly, MD;  Location: [facility] ORS;  Service:  Orthopedics;  Laterality: Left;   SHOULDER ARTHROSCOPY WITH OPEN ROTATOR CUFF REPAIR Right 07/30/2018   Procedure: SHOULDER ARTHROSCOPY WITH OPEN ROTATOR CUFF REPAIR,SUBACROMINAL DECOMPRESSION AND DISTAL CLAVICLE EXCISION;  Surgeon: Juanell Fairly, MD;  Location: [facility] ORS;  Service: Orthopedics;  Laterality: Right;   TOTAL KNEE REVISION Right 05/05/2020   Procedure: right total knee arthroplasty revision;  Surgeon: Lyndle Herrlich, MD;  Location: [facility] ORS;  Service: Orthopedics;  Laterality: Right;    There were no vitals filed for this visit.   Subjective Assessment - 05/30/21 1242     Subjective  Kristine Rangel presents for OT Rx visit 4/24visits  (authorized  by [facility]) to address bilateral lower extremity lipo-lymphedema. Pt reports 3/10 LE related leg pain. Paid caregiver, Jackelyn Poling, is here today to review muti-layer gradient compression bandaging. Pt reports that wraps reduced leg pain substantially over the weekend.    Pertinent History OA knees and back,  OSA, HTN, morbid obesity (BMI 50.0-59.9) , hx anxiety and depression, hx cereberal aneurism,  glaucoma, hx MI, neuropathy, B rotator cuff repairs, Hx TIAs. FOLLOW LYMPHEDREMA PRECAUTIONS FOR diastolic dysfunction, asthma, CKD, dyspnea, CHF, COPD DM    Limitations difficulty walking, impaired standing balance, dyspnea, decreased dynamic balance, decreased LB strength, decreased A/PROM 2/2 body habitus and skin approximation,  increased supportive care needs and decreased quality of life, impaired body image    Repetition Increases Symptoms    Patient Stated Goals "Get these legs straightened out make it not hurt so bad so I can do more."    Pain Onset More than a month ago   RTK 2016. S/P revision 2021. Chronic pain started ~ 2 yrs before revision and persists today                         OT Treatments/Exercises (OP) - 05/30/21 1244       ADLs   ADL Education Given Yes      Manual Therapy   Manual Therapy Edema  management;Compression Bandaging;Manual Lymphatic Drainage (MLD)    Manual Lymphatic Drainage (MLD) MLD to RLE utilizing short neck sequence, deep abdominal pathway, functional inguinal regional LN and proximal to distal J strokes covering thigh, knee and leg in sequence.    Compression Bandaging 3 layer gradiant compression wraps  from base of toes to tibial tuberosity using 10 and 12 " wide short stretch bandages over 0.4 cm thick, single layer of Rosidal fam over cotton stockinett.                    OT Education - 05/30/21 1245     Education Details Pt and CG edu for multilayer gradient compression wrapping to lower extremity below the knee.    Person(s) Educated Patient    Methods Explanation;Demonstration;Handout    Comprehension Verbalized understanding;Returned demonstration;Need further instruction                 OT Long Term Goals - 05/06/21 0916       OT LONG TERM GOAL #1   Title Patient's intake functional score on the FOTO tool is 12 out of 100 (higher number = greater function). Given this patient's comorbidities she will experience at least an increase in function of 6 points, or higher, to increase level of independence with basic ADLs and functional ambulation and mobility.    Baseline 12/100    Time 12    Period Weeks    Status New    Target Date 08/04/21      OT LONG TERM GOAL #2   Title Pt will be able to apply multi-layer, short stretch compression wraps to one leg at a time using correct gradient techniques with maximum caregiver assistance in an effort  to return the affected  limb(s)  to premorbid size and shape, to limit pain and infection risk, and to improve functional mobility and ambulation  for ADLs.    Baseline dependent    Time 4    Period Days    Status New    Target Date --   4th OT Rx visit. CG must be present     OT LONG TERM GOAL #3   Title Pt verbalize  4 primary lymphedema  precautions and prevention strategies using printed  reference (modified independence) to reduce infection risk limit progression of lymphedema.    Baseline Max A    Time 4    Period Days    Status New    Target Date --   4th OT Rx visit     OT LONG TERM GOAL #4   Title Pt will achieve at least 10% limb volume reduction bilaterally during Intensive Phase CDT to prevent re-accumulation of lymphatic congestion and progression of fibrosis, to limit infection risk, to improve functional ambulation and  transfers, and to improve functional performance of ADLs.    Baseline dependent    Time 12    Period Weeks    Status New    Target Date 08/04/21                   Plan - 05/30/21 1245     Clinical Impression Statement Pt tolerated compression well over the weekend and had added bonus of an analgesic effect for lipo-lymphedema -related leg pain. Caregiver did a pretty good jub applying wraps, but needed sme fine tuning instructions today to get wraps all the way up to popliteal fossa, and for creating an oblique top edge with lateral side of wraps higher than medial side, to ensure they remain in place. Reviewed comopression wrapping from start to finish with these enhancements . RLE limb volume is reduced to the point that we did not need to use the additional piece of Rosidal foam to achieve needed length. Single narrow roll of Rosidal is fufficient today to cover leg and foot. Pt tolerated RLE MLD without increased pain. Productive session. ont as per POC.    OT Occupational Profile and History Comprehensive Assessment- Review of records and extensive additional review of physical, cognitive, psychosocial history related to current functional performance    Occupational performance deficits (Please refer to evaluation for details): ADL's;IADL's;Rest and Sleep;Work;Leisure;Social Participation    Body Structure / Function / Physical Skills ADL;Edema;Skin integrity;Mobility;Pain;Decreased knowledge of precautions;Decreased knowledge of use of  DME;IADL;Balance;Endurance;Strength;UE functional use;Obesity;Vision;Gait;ROM;Sensation    Rehab Potential Fair    Clinical Decision Making Several treatment options, min-mod task modification necessary    Comorbidities Affecting Occupational Performance: Presence of comorbidities impacting occupational performance    Comorbidities impacting occupational performance description: Suspected lipedema. Possible CVI. Comorbidities that contribute to systemic fluid retention and lymphatic overload  include CKD, CHF, HTN, OSA, morbid obesity, chronic joint inflammation    Modification or Assistance to Complete Evaluation  Min-Moderate modification of tasks or assist with assess necessary to complete eval    OT Frequency 2x / week    OT Duration 12 weeks   and PRN.   OT Treatment/Interventions Self-care/ADL training;Therapeutic exercise;Manual Therapy;Energy conservation;Manual lymph drainage;Therapeutic activities;DME and/or AE instruction;Compression bandaging;Other (comment);Patient/family education   Fit with custom daytime compression garments and HOS devices designed to limit lympphedema progression and facilitate increased lymphatic circulation  during hours of sleep   Plan Intensive and Self-Management Complete Decongestive Therapy (CDT): Treat one leg at a time only to limit excessive fluid return to the heart and to limit falls risk. Provide Manual lymphatic Drainage (MLD), skin care with low ph lotion/ oil to increase skin hydrarion and mobility, therapeutic lymphatic pumping ther ex and multilayer gradient compression wraps to reduce limb volume. Lastly, fit appropriate compression garments to retain clinic al gains.    Recommended Other Services If cardiology is in agreement, fit with advanced sequential pneumatic compression device, or Flexitouch to decongest lymphatic fluid build up in fatty tissues under the skin.This device wil enable Pt to manage lipo-lymphedema at home over time with fluid  decongestion and pain relief. OT to arrange trial with manufacturer's rep is in agreement.    Consulted and Agree with Plan of Care Patient             Patient will benefit from skilled therapeutic intervention in order to improve the following deficits and impairments:   Body Structure / Function / Physical Skills: ADL, Edema, Skin integrity, Mobility, Pain, Decreased knowledge of precautions, Decreased knowledge  of use of DME, IADL, Balance, Endurance, Strength, UE functional use, Obesity, Vision, Gait, ROM, Sensation       Visit Diagnosis: Lymphedema, not elsewhere classified    Problem List Patient Active Problem List   Diagnosis Date Noted   Chest pain 02/05/2021   SOB (shortness of breath) 02/05/2021   Leg swelling 02/05/2021   Nonrheumatic mitral valve regurgitation 02/05/2021   Morbid obesity (HCC) 02/05/2021   S/P revision of total knee, right 05/05/2020   S/P right rotator cuff repair 07/30/2018   S/P left rotator cuff repair 11/23/2016   Benign essential hypertension 02/05/2003    Loel Dubonnet, Kristine, OTR/L, [facility] 05/30/21 12:52 PM   [facility] [facility] MAIN [facility] SERVICES [address] [place], [place], [address] Phone: [phone]   Fax:  [phone]  Name: Kristine Rangel MRN: [id number] Date of Birth: 1956/05/01

## 2021-05-30 NOTE — Patient Instructions (Signed)
Lymphedema Self- Care Instructions  1. EXERCISE: Perform lymphatic pumping there ex 2 x a day. While wearing your compression wraps or garments. Perform 10 reps of each exercise bilaterally and be sure to perform them in order. Don;t skip around!  OMIT PARTIAL SIT UPs.  2. MLD: Perform simple self-Manual Lymphatic Drainage (MLD) at least once a day as directed.  3. If you have a Flexitouch advanced "pump" use it 1 time each day on a single limb only. The Flexitouch moves lymphatic fluid out of your affected body part and back to your heart, so DO NOT use the Flexi on 2 legs at a time, and DO NOT ues it on 2 legs on the same day. If you experience any atypical shortness of breath, sudden onset of pain, or feelings of heart arhythmia, or racing, discontinue use of the Flexitouch and report these symptoms to your doctor right away.  4. WRAPS: Compression wraps are to be worn 23 hrs/ 7 days/wk during Intensive Phase of Complete Decongestive Therapy (CDT).Building tolerance may take time and practice, so don't get discouraged. If bandages begin to feel tight during periods of inactivity and/or during the night, try performing your exercises to loosen them.   5. GARMENTS: During Management Phase CDT your compression garments are to be worn during waking hours when active. Do NOT sleep in your garments!!   6. PUT YOUR FEET UP! Elevate your feet and legs and feet to the level of your heart whenever you are sitting down.   7. SKIN: Carefully monitor skin condition and perform impeccable hygiene daily. Bathe skin with mild soap and water and apply low pH lotion (aka Eucerin ) to improve hydration and limit infection risk.   

## 2021-06-01 ENCOUNTER — Ambulatory Visit: Payer: Medicare HMO | Admitting: Occupational Therapy

## 2021-06-06 ENCOUNTER — Other Ambulatory Visit: Payer: Self-pay

## 2021-06-06 ENCOUNTER — Ambulatory Visit: Payer: Medicare HMO | Admitting: Occupational Therapy

## 2021-06-06 DIAGNOSIS — I890 Lymphedema, not elsewhere classified: Secondary | ICD-10-CM

## 2021-06-07 NOTE — Therapy (Signed)
[facility] MAIN [facility] [address] [place], [place], [address] Phone: [phone]   Fax:  [phone]  Occupational Therapy Treatment  Patient Details  Name: Kristine DrossCathy A Rangel MRN: [id number] Date of Birth: 1956/07/31 Referring Provider (OT): Lennon AlstromJacquelyn D Visser, PA-C   Encounter Date: 06/06/2021   OT End of Session - 06/06/21 1111     Visit Number 6    Number of Visits 36    Date for OT Re-Evaluation 08/03/21    OT Start Time 1105    OT Stop Time 1205    OT Time Calculation (min) 60 min    Activity Tolerance Patient tolerated treatment well;No increased pain    Behavior During Therapy WFL for tasks assessed/performed             Past Medical History:  Diagnosis Date   Anxiety    Arthritis    Asthma    Bladder leak    Cerebral aneurysm    history-has coils   CHF (congestive heart failure) (HCC)    Chronic kidney disease    COPD (chronic obstructive pulmonary disease) (HCC)    Depression    Diabetes mellitus without complication (HCC)    Dyspnea    GERD (gastroesophageal reflux disease)    Glaucoma    Headache    History of kidney stones    Hyperlipidemia    Hypertension    MI (myocardial infarction) (HCC)    unsure when   Neuropathy    Rotator cuff injury    Seasonal allergies    Stroke [facility](HCC)    TIA's    Past Surgical History:  Procedure Laterality Date   BRAIN SURGERY Right 2010   anuerysm repair   CARPAL TUNNEL RELEASE Right 06/12/2017   Procedure: CARPAL TUNNEL RELEASE;  Surgeon: Juanell FairlyKrasinski, Kevin, MD;  Location: [facility] ORS;  Service: Orthopedics;  Laterality: Right;   CHOLECYSTECTOMY     JOINT REPLACEMENT Bilateral    TOTAL KNEE REPLACEMENT   SHOULDER ARTHROSCOPY WITH OPEN ROTATOR CUFF REPAIR Left 11/23/2016   Procedure: SHOULDER ARTHROSCOPY WITH OPEN ROTATOR CUFF REPAIR, ARTHROSCOPIC SUBACROMIAL DECOMPRESSION, DISTAL CLAVICLE EXCISION;  Surgeon: Juanell FairlyKevin Krasinski, MD;  Location: [facility] ORS;  Service:  Orthopedics;  Laterality: Left;   SHOULDER ARTHROSCOPY WITH OPEN ROTATOR CUFF REPAIR Right 07/30/2018   Procedure: SHOULDER ARTHROSCOPY WITH OPEN ROTATOR CUFF REPAIR,SUBACROMINAL DECOMPRESSION AND DISTAL CLAVICLE EXCISION;  Surgeon: Juanell FairlyKrasinski, Kevin, MD;  Location: [facility] ORS;  Service: Orthopedics;  Laterality: Right;   TOTAL KNEE REVISION Right 05/05/2020   Procedure: right total knee arthroplasty revision;  Surgeon: Lyndle HerrlichBowers, James R, MD;  Location: [facility] ORS;  Service: Orthopedics;  Laterality: Right;    There were no vitals filed for this visit.   Subjective Assessment - 06/06/21 1113     Subjective  [name] presents for OT Rx visit 5/24visits  ([facility]) to address bilateral lower extremity lipo-lymphedema. Pt reports 3/10 LE related leg pain. Paid caregiver attends session today to review muti-layer gradient compression bandaging. Pt reports that wraps reduced leg pain substantially over the weekend.    Pertinent History OA knees and back,  OSA, HTN, morbid obesity (BMI 50.0-59.9) , hx anxiety and depression, hx cereberal aneurism,  glaucoma, hx MI, neuropathy, B rotator cuff repairs, Hx TIAs. FOLLOW LYMPHEDREMA PRECAUTIONS FOR diastolic dysfunction, asthma, CKD, dyspnea, CHF, COPD DM    Limitations difficulty walking, impaired standing balance, dyspnea, decreased dynamic balance, decreased LB strength, decreased A/PROM 2/2 body habitus and skin approximation,  increased  supportive care needs and decreased quality of life, impaired body image    Repetition Increases Symptoms    Patient Stated Goals "Get these legs straightened out make it not hurt so bad so I can do more."    Pain Onset More than a month ago   RTK 2016. S/P revision 2021. Chronic pain started ~ 2 yrs before revision and persists today                         OT Treatments/Exercises (OP) - 06/07/21 1250       ADLs   ADL Education Given Yes      Manual Therapy   Manual Therapy Edema  management;Compression Bandaging;Manual Lymphatic Drainage (MLD)    Manual Lymphatic Drainage (MLD) MLD to RLE utilizing short neck sequence, deep abdominal pathway, functional inguinal regional LN and proximal to distal J strokes covering thigh, knee and leg in sequence.    Compression Bandaging 3 layer gradiant compression wraps  from base of toes to tibial tuberosity using 10 and 12 " wide short stretch bandages over 0.4 cm thick, single layer of Rosidal fam over cotton stockinett.                    OT Education - 06/07/21 1252     Education Details Continued Pt/ CG edu for lymphedema self care  and home program throughout session. Topics include multilayer, gradient compression wrapping, simple self-MLD, therapeutic lymphatic pumping exercises, skin/nail care, risk reduction factors and LE precautions, compression garments/recommendations and wear and care schedule and compression garment donning / doffing using assistive devices. All questions answered to the Pt's satisfaction, and Pt demonstrates understanding by report.    Person(s) Educated Patient;Caregiver(s)    Methods Explanation;Demonstration;Handout    Comprehension Verbalized understanding;Returned demonstration;Need further instruction                 OT Long Term Goals - 05/06/21 0916       OT LONG TERM GOAL #1   Title Patient's intake functional score on the FOTO tool is 12 out of 100 (higher number = greater function). Given this patient's comorbidities she will experience at least an increase in function of 6 points, or higher, to increase level of independence with basic ADLs and functional ambulation and mobility.    Baseline 12/100    Time 12    Period Weeks    Status New    Target Date 08/04/21      OT LONG TERM GOAL #2   Title Pt will be able to apply multi-layer, short stretch compression wraps to one leg at a time using correct gradient techniques with maximum caregiver assistance in an effort   to return the affected  limb(s)  to premorbid size and shape, to limit pain and infection risk, and to improve functional mobility and ambulation  for ADLs.    Baseline dependent    Time 4    Period Days    Status New    Target Date --   4th OT Rx visit. CG must be present     OT LONG TERM GOAL #3   Title Pt verbalize  4 primary lymphedema  precautions and prevention strategies using printed reference (modified independence) to reduce infection risk limit progression of lymphedema.    Baseline Max A    Time 4    Period Days    Status New    Target Date --   4th OT  Rx visit     OT LONG TERM GOAL #4   Title Pt will achieve at least 10% limb volume reduction bilaterally during Intensive Phase CDT to prevent re-accumulation of lymphatic congestion and progression of fibrosis, to limit infection risk, to improve functional ambulation and transfers, and to improve functional performance of ADLs.    Baseline dependent    Time 12    Period Weeks    Status New    Target Date 08/04/21                   Plan - 06/07/21 1243     Clinical Impression Statement Provided RLE MLD, skin cre and gradient compression wrap from base of toes to L leg as estabished. Modified compression wraps today to exclude the foot on trial basis since foot is spared. Pt tolerated all aspects of therapy today. She has been didligent with compression wraps between sessions when help is available at home. Pt continues to report an analgesic effect from gradient compression. Cont as per POC.    OT Occupational Profile and History Comprehensive Assessment- Review of records and extensive additional review of physical, cognitive, psychosocial history related to current functional performance    Occupational performance deficits (Please refer to evaluation for details): ADL's;IADL's;Rest and Sleep;Work;Leisure;Social Participation    Body Structure / Function / Physical Skills ADL;Edema;Skin  integrity;Mobility;Pain;Decreased knowledge of precautions;Decreased knowledge of use of DME;IADL;Balance;Endurance;Strength;UE functional use;Obesity;Vision;Gait;ROM;Sensation    Rehab Potential Fair    Clinical Decision Making Several treatment options, min-mod task modification necessary    Comorbidities Affecting Occupational Performance: Presence of comorbidities impacting occupational performance    Comorbidities impacting occupational performance description: Suspected lipedema. Possible CVI. Comorbidities that contribute to systemic fluid retention and lymphatic overload  include CKD, CHF, HTN, OSA, morbid obesity, chronic joint inflammation    Modification or Assistance to Complete Evaluation  Min-Moderate modification of tasks or assist with assess necessary to complete eval    OT Frequency 2x / week    OT Duration 12 weeks   and PRN.   OT Treatment/Interventions Self-care/ADL training;Therapeutic exercise;Manual Therapy;Energy conservation;Manual lymph drainage;Therapeutic activities;DME and/or AE instruction;Compression bandaging;Other (comment);Patient/family education   Fit with custom daytime compression garments and HOS devices designed to limit lympphedema progression and facilitate increased lymphatic circulation  during hours of sleep   Plan Intensive and Self-Management Complete Decongestive Therapy (CDT): Treat one leg at a time only to limit excessive fluid return to the heart and to limit falls risk. Provide Manual lymphatic Drainage (MLD), skin care with low ph lotion/ oil to increase skin hydrarion and mobility, therapeutic lymphatic pumping ther ex and multilayer gradient compression wraps to reduce limb volume. Lastly, fit appropriate compression garments to retain clinic al gains.    Recommended Other Services If cardiology is in agreement, fit with advanced sequential pneumatic compression device, or Flexitouch to decongest lymphatic fluid build up in fatty tissues under the  skin.This device wil enable Pt to manage lipo-lymphedema at home over time with fluid decongestion and pain relief. OT to arrange trial with manufacturer's rep is in agreement.    Consulted and Agree with Plan of Care Patient             Patient will benefit from skilled therapeutic intervention in order to improve the following deficits and impairments:   Body Structure / Function / Physical Skills: ADL, Edema, Skin integrity, Mobility, Pain, Decreased knowledge of precautions, Decreased knowledge of use of DME, IADL, Balance, Endurance, Strength, UE functional use, Obesity, Vision,  Gait, ROM, Sensation       Visit Diagnosis: Lymphedema, not elsewhere classified    Problem List Patient Active Problem List   Diagnosis Date Noted   Chest pain 02/05/2021   SOB (shortness of breath) 02/05/2021   Leg swelling 02/05/2021   Nonrheumatic mitral valve regurgitation 02/05/2021   Morbid obesity (HCC) 02/05/2021   S/P revision of total knee, right 05/05/2020   S/P right rotator cuff repair 07/30/2018   S/P left rotator cuff repair 11/23/2016   Benign essential hypertension 02/05/2003    Loel Dubonnet, MS, OTR/L, [facility] 06/07/21 12:54 PM   [place] [facility] MAIN [facility] SERVICES [address] [place], [place], [address] Phone: [phone]   Fax:  [phone]  Name: Kristine Rangel MRN: [id number] Date of Birth: [birth year]

## 2021-06-08 ENCOUNTER — Other Ambulatory Visit: Payer: Self-pay

## 2021-06-08 ENCOUNTER — Ambulatory Visit: Payer: Medicare HMO | Admitting: Occupational Therapy

## 2021-06-08 DIAGNOSIS — I890 Lymphedema, not elsewhere classified: Secondary | ICD-10-CM | POA: Diagnosis not present

## 2021-06-08 NOTE — Therapy (Signed)
[facility] MAIN [facility] SERVICES [address] [place], [place], [address] Phone: [phone]   Fax:  [phone]  Occupational Therapy Treatment  Patient Details  Name: Kristine Rangel MRN: [id number] Date of Birth: 12-Sep-1956 Referring Provider (OT): Lennon Alstrom, PA-C   Encounter Date: 06/08/2021   OT End of Session - 06/08/21 1257     Visit Number 7    Number of Visits 36    Date for OT Re-Evaluation 08/03/21    OT Start Time 1105    OT Stop Time 1205    OT Time Calculation (min) 60 min    Activity Tolerance Patient tolerated treatment well;No increased pain    Behavior During Therapy WFL for tasks assessed/performed             Past Medical History:  Diagnosis Date   Anxiety    Arthritis    Asthma    Bladder leak    Cerebral aneurysm    history-has coils   CHF (congestive heart failure) (HCC)    Chronic kidney disease    COPD (chronic obstructive pulmonary disease) (HCC)    Depression    Diabetes mellitus without complication (HCC)    Dyspnea    GERD (gastroesophageal reflux disease)    Glaucoma    Headache    History of kidney stones    Hyperlipidemia    Hypertension    MI (myocardial infarction) (HCC)    unsure when   Neuropathy    Rotator cuff injury    Seasonal allergies    Stroke [facility])    TIA's    Past Surgical History:  Procedure Laterality Date   BRAIN SURGERY Right 2010   anuerysm repair   CARPAL TUNNEL RELEASE Right 06/12/2017   Procedure: CARPAL TUNNEL RELEASE;  Surgeon: Juanell Fairly, MD;  Location: [facility] ORS;  Service: Orthopedics;  Laterality: Right;   CHOLECYSTECTOMY     JOINT REPLACEMENT Bilateral    TOTAL KNEE REPLACEMENT   SHOULDER ARTHROSCOPY WITH OPEN ROTATOR CUFF REPAIR Left 11/23/2016   Procedure: SHOULDER ARTHROSCOPY WITH OPEN ROTATOR CUFF REPAIR, ARTHROSCOPIC SUBACROMIAL DECOMPRESSION, DISTAL CLAVICLE EXCISION;  Surgeon: Juanell Fairly, MD;  Location: [facility] ORS;  Service:  Orthopedics;  Laterality: Left;   SHOULDER ARTHROSCOPY WITH OPEN ROTATOR CUFF REPAIR Right 07/30/2018   Procedure: SHOULDER ARTHROSCOPY WITH OPEN ROTATOR CUFF REPAIR,SUBACROMINAL DECOMPRESSION AND DISTAL CLAVICLE EXCISION;  Surgeon: Juanell Fairly, MD;  Location: [facility] ORS;  Service: Orthopedics;  Laterality: Right;   TOTAL KNEE REVISION Right 05/05/2020   Procedure: right total knee arthroplasty revision;  Surgeon: Lyndle Herrlich, MD;  Location: [facility] ORS;  Service: Orthopedics;  Laterality: Right;    There were no vitals filed for this visit.   Subjective Assessment - 06/08/21 1301     Subjective  Kristine Rangel presents for OT Rx visit 6/24visits  (authorized  by [facility]) to address bilateral lower extremity lipo-lymphedema. Pt reports 3/10 LE related leg pain. Paid caregiver attends session today to review muti-layer gradient compression bandaging. Pt is accompanied by paid caregiver.    Pertinent History OA knees and back,  OSA, HTN, morbid obesity (BMI 50.0-59.9) , hx anxiety and depression, hx cereberal aneurism,  glaucoma, hx MI, neuropathy, B rotator cuff repairs, Hx TIAs. FOLLOW LYMPHEDREMA PRECAUTIONS FOR diastolic dysfunction, asthma, CKD, dyspnea, CHF, COPD DM    Limitations difficulty walking, impaired standing balance, dyspnea, decreased dynamic balance, decreased LB strength, decreased A/PROM 2/2 body habitus and skin approximation,  increased supportive care needs and decreased  quality of life, impaired body image    Repetition Increases Symptoms    Patient Stated Goals "Get these legs straightened out make it not hurt so bad so I can do more."    Pain Onset More than a month ago   RTK 2016. S/P revision 2021. Chronic pain started ~ 2 yrs before revision and persists today                         OT Treatments/Exercises (OP) - 06/08/21 1302       ADLs   ADL Education Given Yes      Manual Therapy   Manual Therapy Edema management;Compression Bandaging;Manual  Lymphatic Drainage (MLD)    Manual Lymphatic Drainage (MLD) MLD to RLE utilizing short neck sequence, deep abdominal pathway, functional inguinal regional LN and proximal to distal J strokes covering thigh, knee and leg in sequence.    Compression Bandaging 3 layer gradiant compression wraps  from base of toes to tibial tuberosity using 10 and 12 " wide short stretch bandages over 0.4 cm thick, single layer of Rosidal fam over cotton stockinett.                    OT Education - 06/08/21 1302     Education Details Continued Pt/ CG edu for lymphedema self care  and home program throughout session. Topics include multilayer, gradient compression wrapping, simple self-MLD, therapeutic lymphatic pumping exercises, skin/nail care, risk reduction factors and LE precautions, compression garments/recommendations and wear and care schedule and compression garment donning / doffing using assistive devices. All questions answered to the Pt's satisfaction, and Pt demonstrates understanding by report.    Person(s) Educated Patient;Caregiver(s)    Methods Explanation;Demonstration;Handout    Comprehension Verbalized understanding;Returned demonstration;Need further instruction                 OT Long Term Goals - 05/06/21 0916       OT LONG TERM GOAL #1   Title Patient's intake functional score on the FOTO tool is 12 out of 100 (higher number = greater function). Given this patient's comorbidities she will experience at least an increase in function of 6 points, or higher, to increase level of independence with basic ADLs and functional ambulation and mobility.    Baseline 12/100    Time 12    Period Weeks    Status New    Target Date 08/04/21      OT LONG TERM GOAL #2   Title Pt will be able to apply multi-layer, short stretch compression wraps to one leg at a time using correct gradient techniques with maximum caregiver assistance in an effort  to return the affected  limb(s)  to  premorbid size and shape, to limit pain and infection risk, and to improve functional mobility and ambulation  for ADLs.    Baseline dependent    Time 4    Period Days    Status New    Target Date --   4th OT Rx visit. CG must be present     OT LONG TERM GOAL #3   Title Pt verbalize  4 primary lymphedema  precautions and prevention strategies using printed reference (modified independence) to reduce infection risk limit progression of lymphedema.    Baseline Max A    Time 4    Period Days    Status New    Target Date --   4th OT Rx visit  OT LONG TERM GOAL #4   Title Pt will achieve at least 10% limb volume reduction bilaterally during Intensive Phase CDT to prevent re-accumulation of lymphatic congestion and progression of fibrosis, to limit infection risk, to improve functional ambulation and transfers, and to improve functional performance of ADLs.    Baseline dependent    Time 12    Period Weeks    Status New    Target Date 08/04/21                   Plan - 06/08/21 1258     Clinical Impression Statement Pt able to tolerate compression wrap excluding foot without increased foot swelling. We'll modify yher wrap this way going forward to reduce falls risk. Pt reports increased apain in her knee this week. Compression wraps may be pushing fluid around joint causing discomfort. we'll consider adding ace wrap to knee joint next week to see if this provides some relief. Pt tolerated LLE MLD, skin care and compression from ankle to tibial tuberositry without increased pain in clinic.Cont as per POC.    OT Occupational Profile and History Comprehensive Assessment- Review of records and extensive additional review of physical, cognitive, psychosocial history related to current functional performance    Occupational performance deficits (Please refer to evaluation for details): ADL's;IADL's;Rest and Sleep;Work;Leisure;Social Participation    Body Structure / Function / Physical  Skills ADL;Edema;Skin integrity;Mobility;Pain;Decreased knowledge of precautions;Decreased knowledge of use of DME;IADL;Balance;Endurance;Strength;UE functional use;Obesity;Vision;Gait;ROM;Sensation    Rehab Potential Fair    Clinical Decision Making Several treatment options, min-mod task modification necessary    Comorbidities Affecting Occupational Performance: Presence of comorbidities impacting occupational performance    Comorbidities impacting occupational performance description: Suspected lipedema. Possible CVI. Comorbidities that contribute to systemic fluid retention and lymphatic overload  include CKD, CHF, HTN, OSA, morbid obesity, chronic joint inflammation    Modification or Assistance to Complete Evaluation  Min-Moderate modification of tasks or assist with assess necessary to complete eval    OT Frequency 2x / week    OT Duration 12 weeks   and PRN.   OT Treatment/Interventions Self-care/ADL training;Therapeutic exercise;Manual Therapy;Energy conservation;Manual lymph drainage;Therapeutic activities;DME and/or AE instruction;Compression bandaging;Other (comment);Patient/family education   Fit with custom daytime compression garments and HOS devices designed to limit lympphedema progression and facilitate increased lymphatic circulation  during hours of sleep   Plan Intensive and Self-Management Complete Decongestive Therapy (CDT): Treat one leg at a time only to limit excessive fluid return to the heart and to limit falls risk. Provide Manual lymphatic Drainage (MLD), skin care with low ph lotion/ oil to increase skin hydrarion and mobility, therapeutic lymphatic pumping ther ex and multilayer gradient compression wraps to reduce limb volume. Lastly, fit appropriate compression garments to retain clinic al gains.    Recommended Other Services If cardiology is in agreement, fit with advanced sequential pneumatic compression device, or Flexitouch to decongest lymphatic fluid build up in  fatty tissues under the skin.This device wil enable Pt to manage lipo-lymphedema at home over time with fluid decongestion and pain relief. OT to arrange trial with manufacturer's rep is in agreement.    Consulted and Agree with Plan of Care Patient             Patient will benefit from skilled therapeutic intervention in order to improve the following deficits and impairments:   Body Structure / Function / Physical Skills: ADL, Edema, Skin integrity, Mobility, Pain, Decreased knowledge of precautions, Decreased knowledge of use of DME, IADL, Balance, Endurance,  Strength, UE functional use, Obesity, Vision, Gait, ROM, Sensation       Visit Diagnosis: Lymphedema, not elsewhere classified    Problem List Patient Active Problem List   Diagnosis Date Noted   Chest pain 02/05/2021   SOB (shortness of breath) 02/05/2021   Leg swelling 02/05/2021   Nonrheumatic mitral valve regurgitation 02/05/2021   Morbid obesity (HCC) 02/05/2021   S/P revision of total knee, right 05/05/2020   S/P right rotator cuff repair 07/30/2018   S/P left rotator cuff repair 11/23/2016   Benign essential hypertension 02/05/2003    Loel Dubonnet, Kristine, OTR/L, CLT-LANA 06/08/21 1:03 PM   [place] [facility] MAIN [facility] SERVICES [address] [place], [place], [address] Phone: [phone]   Fax:  [phone]  Name: Kristine Rangel MRN: [id number] Date of Birth: [birth year]

## 2021-06-08 NOTE — Patient Instructions (Signed)
Lymphedema Self- Care Instructions  1. EXERCISE: Perform lymphatic pumping there ex 2 x a day. While wearing your compression wraps or garments. Perform 10 reps of each exercise bilaterally and be sure to perform them in order. Don;t skip around!  OMIT PARTIAL SIT UPs.  2. MLD: Perform simple self-Manual Lymphatic Drainage (MLD) at least once a day as directed.  3. If you have a Flexitouch advanced "pump" use it 1 time each day on a single limb only. The Flexitouch moves lymphatic fluid out of your affected body part and back to your heart, so DO NOT use the Flexi on 2 legs at a time, and DO NOT ues it on 2 legs on the same day. If you experience any atypical shortness of breath, sudden onset of pain, or feelings of heart arhythmia, or racing, discontinue use of the Flexitouch and report these symptoms to your doctor right away.  4. WRAPS: Compression wraps are to be worn 23 hrs/ 7 days/wk during Intensive Phase of Complete Decongestive Therapy (CDT).Building tolerance may take time and practice, so don't get discouraged. If bandages begin to feel tight during periods of inactivity and/or during the night, try performing your exercises to loosen them.   5. GARMENTS: During Management Phase CDT your compression garments are to be worn during waking hours when active. Do NOT sleep in your garments!!   6. PUT YOUR FEET UP! Elevate your feet and legs and feet to the level of your heart whenever you are sitting down.   7. SKIN: Carefully monitor skin condition and perform impeccable hygiene daily. Bathe skin with mild soap and water and apply low pH lotion (aka Eucerin ) to improve hydration and limit infection risk.   

## 2021-06-13 ENCOUNTER — Ambulatory Visit: Payer: Medicare HMO | Admitting: Occupational Therapy

## 2021-06-15 ENCOUNTER — Ambulatory Visit: Payer: Medicare HMO | Attending: Physician Assistant | Admitting: Occupational Therapy

## 2021-06-15 ENCOUNTER — Other Ambulatory Visit: Payer: Self-pay

## 2021-06-15 DIAGNOSIS — R2681 Unsteadiness on feet: Secondary | ICD-10-CM | POA: Insufficient documentation

## 2021-06-15 DIAGNOSIS — R269 Unspecified abnormalities of gait and mobility: Secondary | ICD-10-CM | POA: Insufficient documentation

## 2021-06-15 DIAGNOSIS — I890 Lymphedema, not elsewhere classified: Secondary | ICD-10-CM | POA: Insufficient documentation

## 2021-06-15 DIAGNOSIS — M6281 Muscle weakness (generalized): Secondary | ICD-10-CM | POA: Insufficient documentation

## 2021-06-15 DIAGNOSIS — R262 Difficulty in walking, not elsewhere classified: Secondary | ICD-10-CM | POA: Insufficient documentation

## 2021-06-15 NOTE — Therapy (Signed)
[place] MAIN [facility] SERVICES [address] [place], [place], [address] Phone: [phone]   Fax:  [phone]  Occupational Therapy Treatment  Patient Details  Name: Kristine Rangel MRN: [id number] Date of Birth: [birth year] Referring Provider (OT): Arvil Chaco, PA-C   Encounter Date: 06/15/2021   OT End of Session - 06/15/21 1115     Visit Number 8    Number of Visits 36    Date for OT Re-Evaluation 08/03/21    OT Start Time 1100    OT Stop Time 1205    OT Time Calculation (min) 65 min    Activity Tolerance Patient tolerated treatment well;No increased pain    Behavior During Therapy WFL for tasks assessed/performed             Past Medical History:  Diagnosis Date   Anxiety    Arthritis    Asthma    Bladder leak    Cerebral aneurysm    history-has coils   CHF (congestive heart failure) (HCC)    Chronic kidney disease    COPD (chronic obstructive pulmonary disease) (HCC)    Depression    Diabetes mellitus without complication (HCC)    Dyspnea    GERD (gastroesophageal reflux disease)    Glaucoma    Headache    History of kidney stones    Hyperlipidemia    Hypertension    MI (myocardial infarction) ([place])    unsure when   Neuropathy    Rotator cuff injury    Seasonal allergies    Stroke [facility])    TIA's    Past Surgical History:  Procedure Laterality Date   BRAIN SURGERY Right 2010   anuerysm repair   CARPAL TUNNEL RELEASE Right 06/12/2017   Procedure: CARPAL TUNNEL RELEASE;  Surgeon: Thornton Park, MD;  Location: [facility] ORS;  Service: Orthopedics;  Laterality: Right;   CHOLECYSTECTOMY     JOINT REPLACEMENT Bilateral    TOTAL KNEE REPLACEMENT   SHOULDER ARTHROSCOPY WITH OPEN ROTATOR CUFF REPAIR Left 11/23/2016   Procedure: SHOULDER ARTHROSCOPY WITH OPEN ROTATOR CUFF REPAIR, ARTHROSCOPIC SUBACROMIAL DECOMPRESSION, DISTAL CLAVICLE EXCISION;  Surgeon: Thornton Park, MD;  Location: [facility] ORS;  Service:  Orthopedics;  Laterality: Left;   SHOULDER ARTHROSCOPY WITH OPEN ROTATOR CUFF REPAIR Right 07/30/2018   Procedure: SHOULDER ARTHROSCOPY WITH OPEN ROTATOR CUFF REPAIR,SUBACROMINAL DECOMPRESSION AND DISTAL CLAVICLE EXCISION;  Surgeon: Thornton Park, MD;  Location: [facility] ORS;  Service: Orthopedics;  Laterality: Right;   TOTAL KNEE REVISION Right 05/05/2020   Procedure: right total knee arthroplasty revision;  Surgeon: Lovell Sheehan, MD;  Location: [facility] ORS;  Service: Orthopedics;  Laterality: Right;    There were no vitals filed for this visit.   Subjective Assessment - 06/15/21 1116     Subjective  [name] presents for OT Rx visit 7/24visits  (authorized  by [facility]) to address bilateral lower extremity lipo-lymphedema. Pt reports 0/10 LE related leg pain. Paid caregiver attends session today. Pt reports she got into PT and has an appointment on 7/31.    Pertinent History OA knees and back,  OSA, HTN, morbid obesity (BMI 50.0-59.9) , hx anxiety and depression, hx cereberal aneurism,  glaucoma, hx MI, neuropathy, B rotator cuff repairs, Hx TIAs. FOLLOW LYMPHEDREMA PRECAUTIONS FOR diastolic dysfunction, asthma, CKD, dyspnea, CHF, COPD DM    Limitations difficulty walking, impaired standing balance, dyspnea, decreased dynamic balance, decreased LB strength, decreased A/PROM 2/2 body habitus and skin approximation,  increased supportive care needs and decreased  quality of life, impaired body image    Repetition Increases Symptoms    Patient Stated Goals "Get these legs straightened out make it not hurt so bad so I can do more."    Pain Onset More than a month ago   RTK 2016. S/P revision 2021. Chronic pain started ~ 2 yrs before revision and persists today                         OT Treatments/Exercises (OP) - 06/15/21 1119       ADLs   ADL Education Given Yes      Manual Therapy   Manual Therapy Edema management    Manual Lymphatic Drainage (MLD) MLD to RLE utilizing  short neck sequence, deep abdominal pathway, functional inguinal regional LN and proximal to distal J strokes covering thigh, knee and leg in sequence.    Compression Bandaging 3 layer gradiant compression wraps  from base of toes to tibial tuberosity using 10 and 12 " wide short stretch bandages over 0.4 cm thick, single layer of Rosidal fam over cotton stockinett.                    OT Education - 06/15/21 1217     Education Details Emphasis of  LE self-care edu today on teaching LE prevention principals and precautions. Goal met.    Person(s) Educated Patient;Caregiver(s)    Methods Explanation;Demonstration;Handout    Comprehension Verbalized understanding;Returned demonstration;Need further instruction                 OT Long Term Goals - 06/15/21 1117       OT LONG TERM GOAL #1   Title Patient's intake functional score on the FOTO tool is 12 out of 100 (higher number = greater function). Given this patient's comorbidities she will experience at least an increase in function of 6 points, or higher, to increase level of independence with basic ADLs and functional ambulation and mobility.    Baseline 12/100    Time 12    Period Weeks    Status New      OT LONG TERM GOAL #2   Title Pt will be able to apply multi-layer, short stretch compression wraps to one leg at a time using correct gradient techniques with maximum caregiver assistance in an effort  to return the affected  limb(s)  to premorbid size and shape, to limit pain and infection risk, and to improve functional mobility and ambulation  for ADLs.    Baseline dependent    Time 4    Period Days    Status Achieved      OT LONG TERM GOAL #3   Title Pt verbalize  4 primary lymphedema  precautions and prevention strategies using printed reference (modified independence) to reduce infection risk limit progression of lymphedema.    Baseline Max A    Time 4    Period Days    Status New      OT LONG TERM GOAL  #4   Title Pt will achieve at least 10% limb volume reduction bilaterally during Intensive Phase CDT to prevent re-accumulation of lymphatic congestion and progression of fibrosis, to limit infection risk, to improve functional ambulation and transfers, and to improve functional performance of ADLs.    Baseline dependent    Time 12    Period Weeks    Status New  Plan - 06/15/21 1213     Clinical Impression Statement Pt managing compression well between visits with caregivers help. Caregivers x 2 have mastered comrpession wrapping. Today was first day since cmmencing CDT that Pt denied leg/ knee pain. Minimal visible or palpable change observed in limb volume and tissue density since starting therapy. Pt tolerated LLE MLD, skin care and multilayer wraps without increased pain today. Complete comparative LLE limb volumetrics next visit to measure progress towards that goal. Pt  achieved lymphedema precautions today during manual therapy. Cont as per POC.    OT Occupational Profile and History Comprehensive Assessment- Review of records and extensive additional review of physical, cognitive, psychosocial history related to current functional performance    Occupational performance deficits (Please refer to evaluation for details): ADL's;IADL's;Rest and Sleep;Work;Leisure;Social Participation    Body Structure / Function / Physical Skills ADL;Edema;Skin integrity;Mobility;Pain;Decreased knowledge of precautions;Decreased knowledge of use of DME;IADL;Balance;Endurance;Strength;UE functional use;Obesity;Vision;Gait;ROM;Sensation    Rehab Potential Fair    Clinical Decision Making Several treatment options, min-mod task modification necessary    Comorbidities Affecting Occupational Performance: Presence of comorbidities impacting occupational performance    Comorbidities impacting occupational performance description: Suspected lipedema. Possible CVI. Comorbidities that  contribute to systemic fluid retention and lymphatic overload  include CKD, CHF, HTN, OSA, morbid obesity, chronic joint inflammation    Modification or Assistance to Complete Evaluation  Min-Moderate modification of tasks or assist with assess necessary to complete eval    OT Frequency 2x / week    OT Duration 12 weeks   and PRN.   OT Treatment/Interventions Self-care/ADL training;Therapeutic exercise;Manual Therapy;Energy conservation;Manual lymph drainage;Therapeutic activities;DME and/or AE instruction;Compression bandaging;Other (comment);Patient/family education   Fit with custom daytime compression garments and HOS devices designed to limit lympphedema progression and facilitate increased lymphatic circulation  during hours of sleep   Plan Intensive and Self-Management Complete Decongestive Therapy (CDT): Treat one leg at a time only to limit excessive fluid return to the heart and to limit falls risk. Provide Manual lymphatic Drainage (MLD), skin care with low ph lotion/ oil to increase skin hydrarion and mobility, therapeutic lymphatic pumping ther ex and multilayer gradient compression wraps to reduce limb volume. Lastly, fit appropriate compression garments to retain clinic al gains.    Recommended Other Services If cardiology is in agreement, fit with advanced sequential pneumatic compression device, or Flexitouch to decongest lymphatic fluid build up in fatty tissues under the skin.This device wil enable Pt to manage lipo-lymphedema at home over time with fluid decongestion and pain relief. OT to arrange trial with manufacturer's rep is in agreement.    Consulted and Agree with Plan of Care Patient             Patient will benefit from skilled therapeutic intervention in order to improve the following deficits and impairments:   Body Structure / Function / Physical Skills: ADL, Edema, Skin integrity, Mobility, Pain, Decreased knowledge of precautions, Decreased knowledge of use of DME,  IADL, Balance, Endurance, Strength, UE functional use, Obesity, Vision, Gait, ROM, Sensation       Visit Diagnosis: Lymphedema, not elsewhere classified    Problem List Patient Active Problem List   Diagnosis Date Noted   Chest pain 02/05/2021   SOB (shortness of breath) 02/05/2021   Leg swelling 02/05/2021   Nonrheumatic mitral valve regurgitation 02/05/2021   Morbid obesity ([place]) 02/05/2021   S/P revision of total knee, right 05/05/2020   S/P right rotator cuff repair 07/30/2018   S/P left rotator cuff repair 11/23/2016  Benign essential hypertension 02/05/2003    Andrey Spearman, MS, OTR/L, [facility] 06/15/21 12:18 PM    [place] MAIN [facility] SERVICES [address] [place], [place], [address] Phone: [phone]   Fax:  [phone]  Name: BRENDAN GRUWELL MRN: [id number] Date of Birth: 1956/02/08

## 2021-06-20 ENCOUNTER — Ambulatory Visit: Payer: Medicare HMO | Admitting: Occupational Therapy

## 2021-06-20 ENCOUNTER — Other Ambulatory Visit: Payer: Self-pay

## 2021-06-20 DIAGNOSIS — I890 Lymphedema, not elsewhere classified: Secondary | ICD-10-CM

## 2021-06-20 NOTE — Therapy (Signed)
[place] MAIN [facility] SERVICES [address] [place], [place], [address] Phone: [phone]   Fax:  [phone]  Occupational Therapy Treatment  Patient Details  Name: Kristine Rangel MRN: [id number] Date of Birth: 05-18-1956 Referring Provider (OT): Arvil Chaco, PA-C   Encounter Date: 06/20/2021   OT End of Session - 06/20/21 1051     Visit Number 9    Number of Visits 36    Date for OT Re-Evaluation 08/03/21    OT Start Time 1055    OT Stop Time 1155    OT Time Calculation (min) 60 min    Activity Tolerance Patient tolerated treatment well;No increased pain    Behavior During Therapy WFL for tasks assessed/performed             Past Medical History:  Diagnosis Date   Anxiety    Arthritis    Asthma    Bladder leak    Cerebral aneurysm    history-has coils   CHF (congestive heart failure) (HCC)    Chronic kidney disease    COPD (chronic obstructive pulmonary disease) (HCC)    Depression    Diabetes mellitus without complication (HCC)    Dyspnea    GERD (gastroesophageal reflux disease)    Glaucoma    Headache    History of kidney stones    Hyperlipidemia    Hypertension    MI (myocardial infarction) ([place])    unsure when   Neuropathy    Rotator cuff injury    Seasonal allergies    Stroke [facility])    TIA's    Past Surgical History:  Procedure Laterality Date   BRAIN SURGERY Right 2010   anuerysm repair   CARPAL TUNNEL RELEASE Right 06/12/2017   Procedure: CARPAL TUNNEL RELEASE;  Surgeon: Thornton Park, MD;  Location: [facility] ORS;  Service: Orthopedics;  Laterality: Right;   CHOLECYSTECTOMY     JOINT REPLACEMENT Bilateral    TOTAL KNEE REPLACEMENT   SHOULDER ARTHROSCOPY WITH OPEN ROTATOR CUFF REPAIR Left 11/23/2016   Procedure: SHOULDER ARTHROSCOPY WITH OPEN ROTATOR CUFF REPAIR, ARTHROSCOPIC SUBACROMIAL DECOMPRESSION, DISTAL CLAVICLE EXCISION;  Surgeon: Thornton Park, MD;  Location: [facility] ORS;  Service:  Orthopedics;  Laterality: Left;   SHOULDER ARTHROSCOPY WITH OPEN ROTATOR CUFF REPAIR Right 07/30/2018   Procedure: SHOULDER ARTHROSCOPY WITH OPEN ROTATOR CUFF REPAIR,SUBACROMINAL DECOMPRESSION AND DISTAL CLAVICLE EXCISION;  Surgeon: Thornton Park, MD;  Location: [facility] ORS;  Service: Orthopedics;  Laterality: Right;   TOTAL KNEE REVISION Right 05/05/2020   Procedure: right total knee arthroplasty revision;  Surgeon: Lovell Sheehan, MD;  Location: [facility] ORS;  Service: Orthopedics;  Laterality: Right;    There were no vitals filed for this visit.   Subjective Assessment - 06/20/21 1304     Subjective  Kristine Rangel presents for OT Rx visit 9/24visits  (authorized  by [facility]) to address bilateral lower extremity lipo-lymphedema. Pt reports 0/10 LE related leg pain. Pt is accompanied by her daughter today.. She presents with compression wraps in place on the RLE, stating she directed her daughter to apply them.    Pertinent History OA knees and back,  OSA, HTN, morbid obesity (BMI 50.0-59.9) , hx anxiety and depression, hx cereberal aneurism,  glaucoma, hx MI, neuropathy, B rotator cuff repairs, Hx TIAs. FOLLOW LYMPHEDREMA PRECAUTIONS FOR diastolic dysfunction, asthma, CKD, dyspnea, CHF, COPD DM    Limitations difficulty walking, impaired standing balance, dyspnea, decreased dynamic balance, decreased LB strength, decreased A/PROM 2/2 body habitus and skin  approximation,  increased supportive care needs and decreased quality of life, impaired body image    Repetition Increases Symptoms    Patient Stated Goals "Get these legs straightened out make it not hurt so bad so I can do more."    Pain Onset More than a month ago   RTK 2016. S/P revision 2021. Chronic pain started ~ 2 yrs before revision and persists today                LYMPHEDEMA/ONCOLOGY QUESTIONNAIRE - 06/20/21 1309       Right Lower Extremity Lymphedema   Other R leg A-D= 5435.5 ml. R thigh (E-G)= 10213.1 ml. R full limb (A-G)=  15648.6 ML since initially measured 05/12/21.    Other R leg DEC by 7.4%, R thigh DEC by 11.1%; R full limb (A-G) DEC by 9.84% on 9th visit.                     OT Treatments/Exercises (OP) - 06/20/21 1307       ADLs   ADL Education Given Yes      Manual Therapy   Manual Therapy Edema management;Manual Lymphatic Drainage (MLD);Compression Bandaging    Manual therapy comments 9th visit FOTO SCORE 12/100. No change    Edema Management RLE comparative limb volumetrics.    Manual Lymphatic Drainage (MLD) MLD to RLE utilizing short neck sequence, deep abdominal pathway, functional inguinal regional LN and proximal to distal J strokes covering thigh, knee and leg in sequence.    Compression Bandaging 3 layer gradiant compression wraps  from base of toes to tibial tuberosity using 10 and 12 " wide short stretch bandages over 0.4 cm thick, single layer of Rosidal fam over cotton stockinett.                    OT Education - 06/20/21 1305     Education Details Reviewed gradient compression wrapping techniques with Patient and family member, so she can direct daughter when needs be. Reviewed progress to date towards all OT goals for lymphedema care.    Person(s) Educated Patient;Caregiver(s)    Methods Explanation;Demonstration;Handout    Comprehension Verbalized understanding;Returned demonstration;Need further instruction                 OT Long Term Goals - 06/20/21 1052       OT LONG TERM GOAL #1   Title Patient's intake functional score on the FOTO tool is 12 out of 100 (higher number = greater function). Given this patient's comorbidities she will experience at least an increase in function of 6 points, or higher, to increase level of independence with basic ADLs and functional ambulation and mobility.    Baseline 12/100    Time 12    Period Weeks    Status On-going   9th visit 06/20/21 score 12/100. Functional score unchanged frm intake. No measurable  progress towards this goal to date.     OT LONG TERM GOAL #2   Title Pt will be able to apply multi-layer, short stretch compression wraps to one leg at a time using correct gradient techniques with maximum caregiver assistance in an effort  to return the affected  limb(s)  to premorbid size and shape, to limit pain and infection risk, and to improve functional mobility and ambulation  for ADLs.    Baseline dependent    Time 4    Period Days    Status Achieved   paid caregiver x 2  able to correctly apply wraps.     OT LONG TERM GOAL #3   Title Pt verbalize  4 primary lymphedema  precautions and prevention strategies using printed reference (modified independence) to reduce infection risk limit progression of lymphedema.    Baseline Max A    Time 4    Period Days    Status Achieved      OT LONG TERM GOAL #4   Title Pt will achieve at least 10% limb volume reduction bilaterally during Intensive Phase CDT to prevent re-accumulation of lymphatic congestion and progression of fibrosis, to limit infection risk, to improve functional ambulation and transfers, and to improve functional performance of ADLs.    Baseline dependent    Time 12    Period Weeks    Status Partially Met   R leg dec 7.4%, thigh dec 11.1%, RLE dec 9.84%. RLE GOAL MET                  Plan - 06/20/21 1207     Clinical Impression Statement Reviewed progress towards all OT goals to date with Pt. Pt has met RLE volumetric reduction goal for full limb with 9.84% reduction from ankle to groin (A-G). Interestingly thigh reduction measures 11.1%% when we have not wrapped at all. R Leg ( A-D), which we have treated with MLD and compression, is decreased in volume the least, mesuring 7.4% limb volume reduction since commencing OT for CDT. Pt has learned lymphedema precautions. Paid caregivers have learned to apply gradient comression wraps correctly. Pt has mastered lymphatic pumping ther ex, and she has been comliant with  all home program componenets between visits. Although RLE volume reductions are somewhat meager due to the size of the limb overall, Pt demonstrates excellent response to treatment with volume reductions she has achieved  in lipo-edematous RLE thus far. Pt tolerated MLD, skin care and compression wrapping without increased pain for last half of session. Cont as per POC.    OT Occupational Profile and History Comprehensive Assessment- Review of records and extensive additional review of physical, cognitive, psychosocial history related to current functional performance    Occupational performance deficits (Please refer to evaluation for details): ADL's;IADL's;Rest and Sleep;Work;Leisure;Social Participation    Body Structure / Function / Physical Skills ADL;Edema;Skin integrity;Mobility;Pain;Decreased knowledge of precautions;Decreased knowledge of use of DME;IADL;Balance;Endurance;Strength;UE functional use;Obesity;Vision;Gait;ROM;Sensation    Rehab Potential Fair    Clinical Decision Making Several treatment options, min-mod task modification necessary    Comorbidities Affecting Occupational Performance: Presence of comorbidities impacting occupational performance    Comorbidities impacting occupational performance description: Suspected lipedema. Possible CVI. Comorbidities that contribute to systemic fluid retention and lymphatic overload  include CKD, CHF, HTN, OSA, morbid obesity, chronic joint inflammation    Modification or Assistance to Complete Evaluation  Min-Moderate modification of tasks or assist with assess necessary to complete eval    OT Frequency 2x / week    OT Duration 12 weeks   and PRN.   OT Treatment/Interventions Self-care/ADL training;Therapeutic exercise;Manual Therapy;Energy conservation;Manual lymph drainage;Therapeutic activities;DME and/or AE instruction;Compression bandaging;Other (comment);Patient/family education   Fit with custom daytime compression garments and HOS  devices designed to limit lympphedema progression and facilitate increased lymphatic circulation  during hours of sleep   Plan Intensive and Self-Management Complete Decongestive Therapy (CDT): Treat one leg at a time only to limit excessive fluid return to the heart and to limit falls risk. Provide Manual lymphatic Drainage (MLD), skin care with low ph lotion/ oil to increase skin hydrarion and mobility,  therapeutic lymphatic pumping ther ex and multilayer gradient compression wraps to reduce limb volume. Lastly, fit appropriate compression garments to retain clinic al gains.    Recommended Other Services If cardiology is in agreement, fit with advanced sequential pneumatic compression device, or Flexitouch to decongest lymphatic fluid build up in fatty tissues under the skin.This device wil enable Pt to manage lipo-lymphedema at home over time with fluid decongestion and pain relief. OT to arrange trial with manufacturer's rep is in agreement.    Consulted and Agree with Plan of Care Patient             Patient will benefit from skilled therapeutic intervention in order to improve the following deficits and impairments:   Body Structure / Function / Physical Skills: ADL, Edema, Skin integrity, Mobility, Pain, Decreased knowledge of precautions, Decreased knowledge of use of DME, IADL, Balance, Endurance, Strength, UE functional use, Obesity, Vision, Gait, ROM, Sensation       Visit Diagnosis: Lymphedema, not elsewhere classified    Problem List Patient Active Problem List   Diagnosis Date Noted   Chest pain 02/05/2021   SOB (shortness of breath) 02/05/2021   Leg swelling 02/05/2021   Nonrheumatic mitral valve regurgitation 02/05/2021   Morbid obesity ([place]) 02/05/2021   S/P revision of total knee, right 05/05/2020   S/P right rotator cuff repair 07/30/2018   S/P left rotator cuff repair 11/23/2016   Benign essential hypertension 02/05/2003    Andrey Spearman, Kristine, OTR/L,  CLT-LANA 06/20/21 1:21 PM   [place] MAIN [facility] SERVICES [address] [place], [place], [address] Phone: [phone]   Fax:  [phone]  Name: Kristine Rangel MRN: [id number] Date of Birth: 06-Aug-1956

## 2021-06-20 NOTE — Patient Instructions (Signed)
Lymphedema Self- Care Instructions  1. EXERCISE: Perform lymphatic pumping there ex 2 x a day. While wearing your compression wraps or garments. Perform 10 reps of each exercise bilaterally and be sure to perform them in order. Don;t skip around!  OMIT PARTIAL SIT UPs.  2. MLD: Perform simple self-Manual Lymphatic Drainage (MLD) at least once a day as directed.  3. If you have a Flexitouch advanced "pump" use it 1 time each day on a single limb only. The Flexitouch moves lymphatic fluid out of your affected body part and back to your heart, so DO NOT use the Flexi on 2 legs at a time, and DO NOT ues it on 2 legs on the same day. If you experience any atypical shortness of breath, sudden onset of pain, or feelings of heart arhythmia, or racing, discontinue use of the Flexitouch and report these symptoms to your doctor right away.  4. WRAPS: Compression wraps are to be worn 23 hrs/ 7 days/wk during Intensive Phase of Complete Decongestive Therapy (CDT).Building tolerance may take time and practice, so don't get discouraged. If bandages begin to feel tight during periods of inactivity and/or during the night, try performing your exercises to loosen them.   5. GARMENTS: During Management Phase CDT your compression garments are to be worn during waking hours when active. Do NOT sleep in your garments!!   6. PUT YOUR FEET UP! Elevate your feet and legs and feet to the level of your heart whenever you are sitting down.   7. SKIN: Carefully monitor skin condition and perform impeccable hygiene daily. Bathe skin with mild soap and water and apply low pH lotion (aka Eucerin ) to improve hydration and limit infection risk.   

## 2021-06-21 ENCOUNTER — Ambulatory Visit: Admit: 2021-06-21 | Payer: Medicare HMO | Admitting: Gastroenterology

## 2021-06-21 SURGERY — COLONOSCOPY
Anesthesia: General

## 2021-06-22 ENCOUNTER — Ambulatory Visit: Payer: Medicare HMO | Admitting: Occupational Therapy

## 2021-06-27 ENCOUNTER — Ambulatory Visit: Payer: Medicare HMO | Admitting: Occupational Therapy

## 2021-06-27 ENCOUNTER — Other Ambulatory Visit: Payer: Self-pay

## 2021-06-27 DIAGNOSIS — I890 Lymphedema, not elsewhere classified: Secondary | ICD-10-CM | POA: Diagnosis not present

## 2021-06-27 NOTE — Therapy (Signed)
[place] MAIN [facility] SERVICES [address] [place], [place], [address] Phone: [phone]   Fax:  [phone]  Occupational Therapy Treatment Note and Progress Report: Lymphedema Care Reporting period 05/05/21 - 06/27/21  Patient Details  Name: Kristine Rangel MRN: [id number] Date of Birth: 10/17/1956 Referring Provider (OT): Arvil Chaco, PA-C   Encounter Date: 06/27/2021   OT End of Session - 06/27/21 1358     Visit Number 10    Number of Visits 36    Date for OT Re-Evaluation 08/03/21    OT Start Time 1115    OT Stop Time 1215    OT Time Calculation (min) 60 min    Activity Tolerance Patient tolerated treatment well;No increased pain    Behavior During Therapy WFL for tasks assessed/performed             Past Medical History:  Diagnosis Date   Anxiety    Arthritis    Asthma    Bladder leak    Cerebral aneurysm    history-has coils   CHF (congestive heart failure) (HCC)    Chronic kidney disease    COPD (chronic obstructive pulmonary disease) (HCC)    Depression    Diabetes mellitus without complication (HCC)    Dyspnea    GERD (gastroesophageal reflux disease)    Glaucoma    Headache    History of kidney stones    Hyperlipidemia    Hypertension    MI (myocardial infarction) ([place])    unsure when   Neuropathy    Rotator cuff injury    Seasonal allergies    Stroke [facility])    TIA's    Past Surgical History:  Procedure Laterality Date   BRAIN SURGERY Right 2010   anuerysm repair   CARPAL TUNNEL RELEASE Right 06/12/2017   Procedure: CARPAL TUNNEL RELEASE;  Surgeon: Thornton Park, MD;  Location: [facility] ORS;  Service: Orthopedics;  Laterality: Right;   CHOLECYSTECTOMY     JOINT REPLACEMENT Bilateral    TOTAL KNEE REPLACEMENT   SHOULDER ARTHROSCOPY WITH OPEN ROTATOR CUFF REPAIR Left 11/23/2016   Procedure: SHOULDER ARTHROSCOPY WITH OPEN ROTATOR CUFF REPAIR, ARTHROSCOPIC SUBACROMIAL DECOMPRESSION, DISTAL CLAVICLE  EXCISION;  Surgeon: Thornton Park, MD;  Location: [facility] ORS;  Service: Orthopedics;  Laterality: Left;   SHOULDER ARTHROSCOPY WITH OPEN ROTATOR CUFF REPAIR Right 07/30/2018   Procedure: SHOULDER ARTHROSCOPY WITH OPEN ROTATOR CUFF REPAIR,SUBACROMINAL DECOMPRESSION AND DISTAL CLAVICLE EXCISION;  Surgeon: Thornton Park, MD;  Location: [facility] ORS;  Service: Orthopedics;  Laterality: Right;   TOTAL KNEE REVISION Right 05/05/2020   Procedure: right total knee arthroplasty revision;  Surgeon: Lovell Sheehan, MD;  Location: [facility] ORS;  Service: Orthopedics;  Laterality: Right;    There were no vitals filed for this visit.   Subjective Assessment - 06/27/21 1401     Subjective  Kristine Rangel presents for OT Rx visit 10/24visits  (authorized  by [facility]) to address bilateral lower extremity lipo-lymphedema. Pt reports 0/10 LE related leg pain. Pt is unaccompanied today.. She presents with compression wraps in place on the RLE. Pt states her daughter used cell phone video she made in clinic last session as reference to apply wraps over the weekend.    Pertinent History OA knees and back,  OSA, HTN, morbid obesity (BMI 50.0-59.9) , hx anxiety and depression, hx cereberal aneurism,  glaucoma, hx MI, neuropathy, B rotator cuff repairs, Hx TIAs. FOLLOW LYMPHEDREMA PRECAUTIONS FOR diastolic dysfunction, asthma, CKD, dyspnea, CHF, COPD DM  Limitations difficulty walking, impaired standing balance, dyspnea, decreased dynamic balance, decreased LB strength, decreased A/PROM 2/2 body habitus and skin approximation,  increased supportive care needs and decreased quality of life, impaired body image    Repetition Increases Symptoms    Patient Stated Goals "Get these legs straightened out make it not hurt so bad so I can do more."    Pain Onset More than a month ago   RTK 2016. S/P revision 2021. Chronic pain started ~ 2 yrs before revision and persists today                         OT  Treatments/Exercises (OP) - 06/27/21 1403       ADLs   ADL Education Given Yes      Manual Therapy   Manual Therapy Edema management;Manual Lymphatic Drainage (MLD);Compression Bandaging    Manual therapy comments 9th visit FOTO SCORE 12/100. No change    Edema Management RLE comparative limb volumetrics.    Manual Lymphatic Drainage (MLD) MLD to RLE utilizing short neck sequence, deep abdominal pathway, functional inguinal regional LN and proximal to distal J strokes covering thigh, knee and leg in sequence.    Compression Bandaging 3 layer gradiant compression wraps  from base of toes to tibial tuberosity using 10 and 12 " wide short stretch bandages over 0.4 cm thick, single layer of Rosidal fam over cotton stockinett.                    OT Education - 06/27/21 1403     Education Details Continued Pt/ CG edu for lymphedema self care  and home program throughout session. Topics include multilayer, gradient compression wrapping, simple self-MLD, therapeutic lymphatic pumping exercises, skin/nail care, risk reduction factors and LE precautions, compression garments/recommendations and wear and care schedule and compression garment donning / doffing using assistive devices. All questions answered to the Pt's satisfaction, and Pt demonstrates understanding by report.    Person(s) Educated Patient;Caregiver(s)    Methods Explanation;Demonstration;Handout    Comprehension Verbalized understanding;Returned demonstration;Need further instruction                 OT Long Term Goals - 06/27/21 1359       OT LONG TERM GOAL #1   Title Patient's intake functional score on the FOTO tool is 12 out of 100 (higher number = greater function). Given this patient's comorbidities she will experience at least an increase in function of 6 points, or higher, to increase level of independence with basic ADLs and functional ambulation and mobility.    Baseline 12/100    Time 12    Period Weeks     Status On-going   9th visit 06/20/21 score 12/100. Functional score unchanged frm intake. No measurable progress towards this goal to date.   Target Date 08/04/21      OT LONG TERM GOAL #2   Title Pt will be able to apply multi-layer, short stretch compression wraps to one leg at a time using correct gradient techniques with maximum caregiver assistance in an effort  to return the affected  limb(s)  to premorbid size and shape, to limit pain and infection risk, and to improve functional mobility and ambulation  for ADLs.    Baseline dependent    Time 4    Period Days    Status Achieved   paid caregiver x 2 able to correctly apply wraps.     OT LONG TERM GOAL #  3   Title Pt verbalize  4 primary lymphedema  precautions and prevention strategies using printed reference (modified independence) to reduce infection risk limit progression of lymphedema.    Baseline Max A    Time 4    Period Days    Status Achieved      OT LONG TERM GOAL #4   Title Pt will achieve at least 10% limb volume reduction bilaterally during Intensive Phase CDT to prevent re-accumulation of lymphatic congestion and progression of fibrosis, to limit infection risk, to improve functional ambulation and transfers, and to improve functional performance of ADLs.    Baseline dependent    Time 12    Period Weeks    Status Partially Met   RLE dec 9.84%. RLE GOAL MET   Target Date 08/04/21                   Plan - 06/27/21 1405     Clinical Impression Statement Provided RLE MLD, skin care and reapplied multi-layer gradient compression wraps to LLE today while reviewing progress towards all OT  goals to date. Pt has met RLE volumetric reduction goal for full limb with 9.84% reduction from ankle to groin (A-G). Interestingly thigh reduction measures 11.1%% when we have not wrapped at all. R Leg ( A-D), which we have treated with MLD and compression, is decreased in volume the least, mesuring 7.4% limb volume reduction  since commencing OT for CDT. Pt has learned lymphedema precautions. Paid caregivers have learned to apply gradient comression wraps correctly. Pt has mastered lymphatic pumping ther ex, and she has been comliant with all home program componenets between visits. Although RLE volume reductions are somewhat meager due to the size of the limb overall, Pt demonstrates excellent response to treatment with volume reductions she has achieved  in lipo-edematous RLE thus far. Pt tolerated MLD, skin care and compression wrapping without increased pain for last half of session. Cont as per POC.    OT Occupational Profile and History Comprehensive Assessment- Review of records and extensive additional review of physical, cognitive, psychosocial history related to current functional performance    Occupational performance deficits (Please refer to evaluation for details): ADL's;IADL's;Rest and Sleep;Work;Leisure;Social Participation    Body Structure / Function / Physical Skills ADL;Edema;Skin integrity;Mobility;Pain;Decreased knowledge of precautions;Decreased knowledge of use of DME;IADL;Balance;Endurance;Strength;UE functional use;Obesity;Vision;Gait;ROM;Sensation    Rehab Potential Fair    Clinical Decision Making Several treatment options, min-mod task modification necessary    Comorbidities Affecting Occupational Performance: Presence of comorbidities impacting occupational performance    Comorbidities impacting occupational performance description: Suspected lipedema. Possible CVI. Comorbidities that contribute to systemic fluid retention and lymphatic overload  include CKD, CHF, HTN, OSA, morbid obesity, chronic joint inflammation    Modification or Assistance to Complete Evaluation  Min-Moderate modification of tasks or assist with assess necessary to complete eval    OT Frequency 2x / week    OT Duration 12 weeks   and PRN.   OT Treatment/Interventions Self-care/ADL training;Therapeutic exercise;Manual  Therapy;Energy conservation;Manual lymph drainage;Therapeutic activities;DME and/or AE instruction;Compression bandaging;Other (comment);Patient/family education   Fit with custom daytime compression garments and HOS devices designed to limit lympphedema progression and facilitate increased lymphatic circulation  during hours of sleep   Plan Intensive and Self-Management Complete Decongestive Therapy (CDT): Treat one leg at a time only to limit excessive fluid return to the heart and to limit falls risk. Provide Manual lymphatic Drainage (MLD), skin care with low ph lotion/ oil to increase skin hydrarion and mobility,  therapeutic lymphatic pumping ther ex and multilayer gradient compression wraps to reduce limb volume. Lastly, fit appropriate compression garments to retain clinic al gains.    Recommended Other Services If cardiology is in agreement, fit with advanced sequential pneumatic compression device, or Flexitouch to decongest lymphatic fluid build up in fatty tissues under the skin.This device wil enable Pt to manage lipo-lymphedema at home over time with fluid decongestion and pain relief. OT to arrange trial with manufacturer's rep is in agreement.    Consulted and Agree with Plan of Care Patient             Patient will benefit from skilled therapeutic intervention in order to improve the following deficits and impairments:   Body Structure / Function / Physical Skills: ADL, Edema, Skin integrity, Mobility, Pain, Decreased knowledge of precautions, Decreased knowledge of use of DME, IADL, Balance, Endurance, Strength, UE functional use, Obesity, Vision, Gait, ROM, Sensation       Visit Diagnosis: Lymphedema, not elsewhere classified    Problem List Patient Active Problem List   Diagnosis Date Noted   Chest pain 02/05/2021   SOB (shortness of breath) 02/05/2021   Leg swelling 02/05/2021   Nonrheumatic mitral valve regurgitation 02/05/2021   Morbid obesity ([place]) 02/05/2021    S/P revision of total knee, right 05/05/2020   S/P right rotator cuff repair 07/30/2018   S/P left rotator cuff repair 11/23/2016   Benign essential hypertension 02/05/2003   Andrey Spearman, Kristine, OTR/L, [facility] 06/27/21 2:08 PM    [place] MAIN [facility] SERVICES [address] [place], [place], [address] Phone: [phone]   Fax:  [phone]  Name: TRENAE BRUNKE MRN: [id number] Date of Birth: 1956/07/09

## 2021-06-27 NOTE — Patient Instructions (Signed)
Lymphedema Self- Care Instructions   1. EXERCISE: Perform lymphatic pumping there ex 2 x a day. While wearing your compression wraps or garments. Perform 10 reps of each exercise bilaterally and be sure to perform them in order. Don;t skip around!  OMIT PARTIAL SIT UPs.  2. MLD: Perform simple self-Manual Lymphatic Drainage (MLD) at least once a day as directed.  3. If you have a Flexitouch advanced "pump" use it 1 time each day on a single limb only. The Flexitouch moves lymphatic fluid out of your affected body part and back to your heart, so DO NOT use the Flexi on 2 legs at a time, and DO NOT ues it on 2 legs on the same day. If you experience any atypical shortness of breath, sudden onset of pain, or feelings of heart arhythmia, or racing, discontinue use of the Flexitouch and report these symptoms to your doctor right away.  4. 4. WRAPS: Compression wraps are to be worn 23 hrs/ 7 days/wk during Intensive Phase of Complete Decongestive Therapy (CDT).Building tolerance may take time and practice, so don't get discouraged. If bandages begin to feel tight during periods of inactivity and/or during the night, try performing your exercises to loosen them. Do not leave short stretch wraps in place for > 23 hours. It is very important that you remove all wraps daily to inspect skin, bathe and perform skin care before reapplying multi-layer, short stretch gradient compression wraps.  5. Daytime GARMENTS/ HOS DEVICES: During Management Phase CDT your compression garments are to be worn during waking hours when active. Do NOT sleep in your garments!!  Don daytime garments first thing in the morning. Do not wear your HOS devices all day instead of garments. These are not made to be tight and will not contain your swelling like your stockings will.  6. PUT YOUR FEET UP! Elevate your feet and legs and feet to the level of your heart whenever you are sitting down.   7. SKIN: Carefully monitor skin condition  and perform impeccable hygiene daily. Bathe skin with mild soap and water and apply low pH lotion (aka Eucerin ) to improve hydration and limit infection risk.  

## 2021-06-29 ENCOUNTER — Other Ambulatory Visit: Payer: Self-pay

## 2021-06-29 ENCOUNTER — Ambulatory Visit: Payer: Medicare HMO | Admitting: Occupational Therapy

## 2021-06-29 DIAGNOSIS — I890 Lymphedema, not elsewhere classified: Secondary | ICD-10-CM | POA: Diagnosis not present

## 2021-06-29 NOTE — Therapy (Signed)
[place] MAIN [facility] SERVICES [address] [place], [place], [address] Phone: [phone]   Fax:  [phone]  Occupational Therapy Treatment  Patient Details  Name: Kristine Rangel MRN: [id number] Date of Birth: [birth year] Referring Provider (OT): Arvil Chaco, PA-C   Encounter Date: 06/29/2021   OT End of Session - 06/29/21 1115     Visit Number 11    Number of Visits 36    Date for OT Re-Evaluation 08/03/21    OT Start Time 1110    OT Stop Time 1200    OT Time Calculation (min) 50 min    Activity Tolerance Patient tolerated treatment well;No increased pain    Behavior During Therapy WFL for tasks assessed/performed             Past Medical History:  Diagnosis Date   Anxiety    Arthritis    Asthma    Bladder leak    Cerebral aneurysm    history-has coils   CHF (congestive heart failure) (HCC)    Chronic kidney disease    COPD (chronic obstructive pulmonary disease) (HCC)    Depression    Diabetes mellitus without complication (HCC)    Dyspnea    GERD (gastroesophageal reflux disease)    Glaucoma    Headache    History of kidney stones    Hyperlipidemia    Hypertension    MI (myocardial infarction) ([place])    unsure when   Neuropathy    Rotator cuff injury    Seasonal allergies    Stroke [facility])    TIA's    Past Surgical History:  Procedure Laterality Date   BRAIN SURGERY Right 2010   anuerysm repair   CARPAL TUNNEL RELEASE Right 06/12/2017   Procedure: CARPAL TUNNEL RELEASE;  Surgeon: Thornton Park, MD;  Location: [facility] ORS;  Service: Orthopedics;  Laterality: Right;   CHOLECYSTECTOMY     JOINT REPLACEMENT Bilateral    TOTAL KNEE REPLACEMENT   SHOULDER ARTHROSCOPY WITH OPEN ROTATOR CUFF REPAIR Left 11/23/2016   Procedure: SHOULDER ARTHROSCOPY WITH OPEN ROTATOR CUFF REPAIR, ARTHROSCOPIC SUBACROMIAL DECOMPRESSION, DISTAL CLAVICLE EXCISION;  Surgeon: Thornton Park, MD;  Location: [facility] ORS;  Service:  Orthopedics;  Laterality: Left;   SHOULDER ARTHROSCOPY WITH OPEN ROTATOR CUFF REPAIR Right 07/30/2018   Procedure: SHOULDER ARTHROSCOPY WITH OPEN ROTATOR CUFF REPAIR,SUBACROMINAL DECOMPRESSION AND DISTAL CLAVICLE EXCISION;  Surgeon: Thornton Park, MD;  Location: [facility] ORS;  Service: Orthopedics;  Laterality: Right;   TOTAL KNEE REVISION Right 05/05/2020   Procedure: right total knee arthroplasty revision;  Surgeon: Lovell Sheehan, MD;  Location: [facility] ORS;  Service: Orthopedics;  Laterality: Right;    There were no vitals filed for this visit.   Subjective Assessment - 06/29/21 1116     Subjective  [name] presents for OT Rx visit 11/24visits  (authorized  by [facility]) to address bilateral lower extremity lipo-lymphedema. Pt reports 0/10 LE related leg pain. Pt is unaccompanied today.. She presents with compression wraps in place on the RLE. Pt states her daughter used cell phone video she made in clinic last session as reference to apply wraps over the weekend.    Pertinent History OA knees and back,  OSA, HTN, morbid obesity (BMI 50.0-59.9) , hx anxiety and depression, hx cereberal aneurism,  glaucoma, hx MI, neuropathy, B rotator cuff repairs, Hx TIAs. FOLLOW LYMPHEDREMA PRECAUTIONS FOR diastolic dysfunction, asthma, CKD, dyspnea, CHF, COPD DM    Limitations difficulty walking, impaired standing balance, dyspnea, decreased dynamic  balance, decreased LB strength, decreased A/PROM 2/2 body habitus and skin approximation,  increased supportive care needs and decreased quality of life, impaired body image    Repetition Increases Symptoms    Patient Stated Goals "Get these legs straightened out make it not hurt so bad so I can do more."    Pain Onset More than a month ago   RTK 2016. S/P revision 2021. Chronic pain started ~ 2 yrs before revision and persists today                         OT Treatments/Exercises (OP) - 06/29/21 1117       ADLs   ADL Education Given Yes       Manual Therapy   Manual Therapy Edema management;Manual Lymphatic Drainage (MLD);Compression Bandaging    Manual therapy comments 9th visit FOTO SCORE 12/100. No change    Manual Lymphatic Drainage (MLD) MLD to RLE utilizing short neck sequence, deep abdominal pathway, functional inguinal regional LN and proximal to distal J strokes covering thigh, knee and leg in sequence.    Compression Bandaging 3 layer gradiant compression wraps  from base of toes to tibial tuberosity using 10 and 12 " wide short stretch bandages over 0.4 cm thick, single layer of Rosidal fam over cotton stockinett.                    OT Education - 06/29/21 1204     Education Details Continued Pt/ CG edu for lymphedema self care  and home program throughout session. Topics include multilayer, gradient compression wrapping, simple self-MLD, therapeutic lymphatic pumping exercises, skin/nail care, risk reduction factors and LE precautions, compression garments/recommendations and wear and care schedule and compression garment donning / doffing using assistive devices. All questions answered to the Pt's satisfaction, and Pt demonstrates understanding by report.    Person(s) Educated Patient;Caregiver(s)    Methods Explanation;Demonstration;Handout    Comprehension Verbalized understanding;Returned demonstration;Need further instruction                 OT Long Term Goals - 06/27/21 1359       OT LONG TERM GOAL #1   Title Patient's intake functional score on the FOTO tool is 12 out of 100 (higher number = greater function). Given this patient's comorbidities she will experience at least an increase in function of 6 points, or higher, to increase level of independence with basic ADLs and functional ambulation and mobility.    Baseline 12/100    Time 12    Period Weeks    Status On-going   9th visit 06/20/21 score 12/100. Functional score unchanged frm intake. No measurable progress towards this goal to date.    Target Date 08/04/21      OT LONG TERM GOAL #2   Title Pt will be able to apply multi-layer, short stretch compression wraps to one leg at a time using correct gradient techniques with maximum caregiver assistance in an effort  to return the affected  limb(s)  to premorbid size and shape, to limit pain and infection risk, and to improve functional mobility and ambulation  for ADLs.    Baseline dependent    Time 4    Period Days    Status Achieved   paid caregiver x 2 able to correctly apply wraps.     OT LONG TERM GOAL #3   Title Pt verbalize  4 primary lymphedema  precautions and prevention strategies using printed reference (  modified independence) to reduce infection risk limit progression of lymphedema.    Baseline Max A    Time 4    Period Days    Status Achieved      OT LONG TERM GOAL #4   Title Pt will achieve at least 10% limb volume reduction bilaterally during Intensive Phase CDT to prevent re-accumulation of lymphatic congestion and progression of fibrosis, to limit infection risk, to improve functional ambulation and transfers, and to improve functional performance of ADLs.    Baseline dependent    Time 12    Period Weeks    Status Partially Met   RLE dec 9.84%. RLE GOAL MET   Target Date 08/04/21                   Plan - 06/29/21 1204     Clinical Impression Statement Provided RLE MLD, skin care and gradient compression wrap from base of toes to L leg as estabished. Pt doing well w/out foot wrapped. No increased distal swelling observed. and fall risk decreased without wraps on feet. Pt tolerated all aspects of therapy today. She continues to be diligent with compression wraps between sessions when help is available at home. Pt continues to report an analgesic effect from gradient compression. Cont as per POC.    OT Occupational Profile and History Comprehensive Assessment- Review of records and extensive additional review of physical, cognitive, psychosocial  history related to current functional performance    Occupational performance deficits (Please refer to evaluation for details): ADL's;IADL's;Rest and Sleep;Work;Leisure;Social Participation    Body Structure / Function / Physical Skills ADL;Edema;Skin integrity;Mobility;Pain;Decreased knowledge of precautions;Decreased knowledge of use of DME;IADL;Balance;Endurance;Strength;UE functional use;Obesity;Vision;Gait;ROM;Sensation    Rehab Potential Fair    Clinical Decision Making Several treatment options, min-mod task modification necessary    Comorbidities Affecting Occupational Performance: Presence of comorbidities impacting occupational performance    Comorbidities impacting occupational performance description: Suspected lipedema. Possible CVI. Comorbidities that contribute to systemic fluid retention and lymphatic overload  include CKD, CHF, HTN, OSA, morbid obesity, chronic joint inflammation    Modification or Assistance to Complete Evaluation  Min-Moderate modification of tasks or assist with assess necessary to complete eval    OT Frequency 2x / week    OT Duration 12 weeks   and PRN.   OT Treatment/Interventions Self-care/ADL training;Therapeutic exercise;Manual Therapy;Energy conservation;Manual lymph drainage;Therapeutic activities;DME and/or AE instruction;Compression bandaging;Other (comment);Patient/family education   Fit with custom daytime compression garments and HOS devices designed to limit lympphedema progression and facilitate increased lymphatic circulation  during hours of sleep   Plan Intensive and Self-Management Complete Decongestive Therapy (CDT): Treat one leg at a time only to limit excessive fluid return to the heart and to limit falls risk. Provide Manual lymphatic Drainage (MLD), skin care with low ph lotion/ oil to increase skin hydrarion and mobility, therapeutic lymphatic pumping ther ex and multilayer gradient compression wraps to reduce limb volume. Lastly, fit  appropriate compression garments to retain clinic al gains.    Recommended Other Services If cardiology is in agreement, fit with advanced sequential pneumatic compression device, or Flexitouch to decongest lymphatic fluid build up in fatty tissues under the skin.This device wil enable Pt to manage lipo-lymphedema at home over time with fluid decongestion and pain relief. OT to arrange trial with manufacturer's rep is in agreement.    Consulted and Agree with Plan of Care Patient             Patient will benefit from skilled therapeutic intervention  in order to improve the following deficits and impairments:   Body Structure / Function / Physical Skills: ADL, Edema, Skin integrity, Mobility, Pain, Decreased knowledge of precautions, Decreased knowledge of use of DME, IADL, Balance, Endurance, Strength, UE functional use, Obesity, Vision, Gait, ROM, Sensation       Visit Diagnosis: Lymphedema, not elsewhere classified    Problem List Patient Active Problem List   Diagnosis Date Noted   Chest pain 02/05/2021   SOB (shortness of breath) 02/05/2021   Leg swelling 02/05/2021   Nonrheumatic mitral valve regurgitation 02/05/2021   Morbid obesity ([place]) 02/05/2021   S/P revision of total knee, right 05/05/2020   S/P right rotator cuff repair 07/30/2018   S/P left rotator cuff repair 11/23/2016   Benign essential hypertension 02/05/2003    Ansel Bong 06/29/2021, 12:07 PM  [place] MAIN [facility] SERVICES [address] [place], [place], [address] Phone: [phone]   Fax:  [phone]  Name: KAMALEI ROEDER MRN: [id number] Date of Birth: 05/17/1956

## 2021-06-29 NOTE — Patient Instructions (Signed)
Lymphedema Self- Care Instructions   1. EXERCISE: Perform lymphatic pumping there ex 2 x a day. While wearing your compression wraps or garments. Perform 10 reps of each exercise bilaterally and be sure to perform them in order. Don;t skip around!  OMIT PARTIAL SIT UPs.  2. MLD: Perform simple self-Manual Lymphatic Drainage (MLD) at least once a day as directed.  3. If you have a Flexitouch advanced "pump" use it 1 time each day on a single limb only. The Flexitouch moves lymphatic fluid out of your affected body part and back to your heart, so DO NOT use the Flexi on 2 legs at a time, and DO NOT ues it on 2 legs on the same day. If you experience any atypical shortness of breath, sudden onset of pain, or feelings of heart arhythmia, or racing, discontinue use of the Flexitouch and report these symptoms to your doctor right away.  4. 4. WRAPS: Compression wraps are to be worn 23 hrs/ 7 days/wk during Intensive Phase of Complete Decongestive Therapy (CDT).Building tolerance may take time and practice, so don't get discouraged. If bandages begin to feel tight during periods of inactivity and/or during the night, try performing your exercises to loosen them. Do not leave short stretch wraps in place for > 23 hours. It is very important that you remove all wraps daily to inspect skin, bathe and perform skin care before reapplying multi-layer, short stretch gradient compression wraps.  5. Daytime GARMENTS/ HOS DEVICES: During Management Phase CDT your compression garments are to be worn during waking hours when active. Do NOT sleep in your garments!!  Don daytime garments first thing in the morning. Do not wear your HOS devices all day instead of garments. These are not made to be tight and will not contain your swelling like your stockings will.  6. PUT YOUR FEET UP! Elevate your feet and legs and feet to the level of your heart whenever you are sitting down.   7. SKIN: Carefully monitor skin condition  and perform impeccable hygiene daily. Bathe skin with mild soap and water and apply low pH lotion (aka Eucerin ) to improve hydration and limit infection risk.  

## 2021-07-05 ENCOUNTER — Ambulatory Visit: Payer: Medicare HMO | Admitting: Occupational Therapy

## 2021-07-11 ENCOUNTER — Other Ambulatory Visit: Payer: Self-pay

## 2021-07-11 ENCOUNTER — Ambulatory Visit: Payer: Medicare HMO | Admitting: Occupational Therapy

## 2021-07-11 ENCOUNTER — Encounter: Payer: Medicare HMO | Admitting: Occupational Therapy

## 2021-07-11 DIAGNOSIS — I890 Lymphedema, not elsewhere classified: Secondary | ICD-10-CM | POA: Diagnosis not present

## 2021-07-11 NOTE — Therapy (Signed)
[place] MAIN [facility] SERVICES [address] [place], [place], [address] Phone: [phone]   Fax:  [phone]  Occupational Therapy Treatment  Patient Details  Name: Kristine Rangel MRN: [id number] Date of Birth: 21-May-1956 Referring Provider (OT): Arvil Chaco, PA-C   Encounter Date: 07/11/2021   OT End of Session - 07/11/21 1014     Visit Number 12    Number of Visits 36    Date for OT Re-Evaluation 08/03/21    OT Start Time 1005    OT Stop Time 1105    OT Time Calculation (min) 60 min    Activity Tolerance Patient tolerated treatment well;No increased pain    Behavior During Therapy WFL for tasks assessed/performed             Past Medical History:  Diagnosis Date   Anxiety    Arthritis    Asthma    Bladder leak    Cerebral aneurysm    history-has coils   CHF (congestive heart failure) (HCC)    Chronic kidney disease    COPD (chronic obstructive pulmonary disease) (HCC)    Depression    Diabetes mellitus without complication (HCC)    Dyspnea    GERD (gastroesophageal reflux disease)    Glaucoma    Headache    History of kidney stones    Hyperlipidemia    Hypertension    MI (myocardial infarction) ([place])    unsure when   Neuropathy    Rotator cuff injury    Seasonal allergies    Stroke [facility])    TIA's    Past Surgical History:  Procedure Laterality Date   BRAIN SURGERY Right 2010   anuerysm repair   CARPAL TUNNEL RELEASE Right 06/12/2017   Procedure: CARPAL TUNNEL RELEASE;  Surgeon: Thornton Park, MD;  Location: [facility] ORS;  Service: Orthopedics;  Laterality: Right;   CHOLECYSTECTOMY     JOINT REPLACEMENT Bilateral    TOTAL KNEE REPLACEMENT   SHOULDER ARTHROSCOPY WITH OPEN ROTATOR CUFF REPAIR Left 11/23/2016   Procedure: SHOULDER ARTHROSCOPY WITH OPEN ROTATOR CUFF REPAIR, ARTHROSCOPIC SUBACROMIAL DECOMPRESSION, DISTAL CLAVICLE EXCISION;  Surgeon: Thornton Park, MD;  Location: [facility] ORS;  Service:  Orthopedics;  Laterality: Left;   SHOULDER ARTHROSCOPY WITH OPEN ROTATOR CUFF REPAIR Right 07/30/2018   Procedure: SHOULDER ARTHROSCOPY WITH OPEN ROTATOR CUFF REPAIR,SUBACROMINAL DECOMPRESSION AND DISTAL CLAVICLE EXCISION;  Surgeon: Thornton Park, MD;  Location: [facility] ORS;  Service: Orthopedics;  Laterality: Right;   TOTAL KNEE REVISION Right 05/05/2020   Procedure: right total knee arthroplasty revision;  Surgeon: Lovell Sheehan, MD;  Location: [facility] ORS;  Service: Orthopedics;  Laterality: Right;    There were no vitals filed for this visit.   Subjective Assessment - 07/11/21 1252     Subjective  Ms [name] presents for OT Rx visit 12/24visits  (authorized  by [facility]) to address bilateral lower extremity lipo-lymphedema. Pt reports 0/10 LE related leg pain. Pt is unaccompanied today.. She presents with compression wraps in place on the RLE.    Pertinent History OA knees and back,  OSA, HTN, morbid obesity (BMI 50.0-59.9) , hx anxiety and depression, hx cereberal aneurism,  glaucoma, hx MI, neuropathy, B rotator cuff repairs, Hx TIAs. FOLLOW LYMPHEDREMA PRECAUTIONS FOR diastolic dysfunction, asthma, CKD, dyspnea, CHF, COPD DM    Limitations difficulty walking, impaired standing balance, dyspnea, decreased dynamic balance, decreased LB strength, decreased A/PROM 2/2 body habitus and skin approximation,  increased supportive care needs and decreased quality of life,  impaired body image    Repetition Increases Symptoms    Patient Stated Goals "Get these legs straightened out make it not hurt so bad so I can do more."    Pain Onset More than a month ago   RTK 2016. S/P revision 2021. Chronic pain started ~ 2 yrs before revision and persists today                         OT Treatments/Exercises (OP) - 07/11/21 1253       ADLs   ADL Education Given Yes      Manual Therapy   Manual Therapy Edema management;Compression Bandaging    Manual therapy comments 9th visit FOTO  SCORE 12/100. No change    Edema Management custom garment measurements    Compression Bandaging 3 layer gradiant compression wraps  from base of toes to tibial tuberosity using 10 and 12 " wide short stretch bandages over 0.4 cm thick, single layer of Rosidal fam over cotton stockinett.                    OT Education - 07/11/21 1254     Education Details Pt edu focused on differences between custom  lat knit and off-the-shelf circular elastic compression garments.  Educated Pt re indications for both and pros and cons of each, and rational for therapist's current recommendations  Educated Pt re estimated costs, measuring and fitting process ,DME vendor's involvement and access to insurance benefits.    Person(s) Educated Patient;Caregiver(s)    Methods Explanation;Demonstration;Handout    Comprehension Verbalized understanding;Returned demonstration;Need further instruction                 OT Long Term Goals - 06/27/21 1359       OT LONG TERM GOAL #1   Title Patient's intake functional score on the FOTO tool is 12 out of 100 (higher number = greater function). Given this patient's comorbidities she will experience at least an increase in function of 6 points, or higher, to increase level of independence with basic ADLs and functional ambulation and mobility.    Baseline 12/100    Time 12    Period Weeks    Status On-going   9th visit 06/20/21 score 12/100. Functional score unchanged frm intake. No measurable progress towards this goal to date.   Target Date 08/04/21      OT LONG TERM GOAL #2   Title Pt will be able to apply multi-layer, short stretch compression wraps to one leg at a time using correct gradient techniques with maximum caregiver assistance in an effort  to return the affected  limb(s)  to premorbid size and shape, to limit pain and infection risk, and to improve functional mobility and ambulation  for ADLs.    Baseline dependent    Time 4    Period Days     Status Achieved   paid caregiver x 2 able to correctly apply wraps.     OT LONG TERM GOAL #3   Title Pt verbalize  4 primary lymphedema  precautions and prevention strategies using printed reference (modified independence) to reduce infection risk limit progression of lymphedema.    Baseline Max A    Time 4    Period Days    Status Achieved      OT LONG TERM GOAL #4   Title Pt will achieve at least 10% limb volume reduction bilaterally during Intensive Phase CDT to prevent re-accumulation  of lymphatic congestion and progression of fibrosis, to limit infection risk, to improve functional ambulation and transfers, and to improve functional performance of ADLs.    Baseline dependent    Time 12    Period Weeks    Status Partially Met   RLE dec 9.84%. RLE GOAL MET   Target Date 08/04/21                   Plan - 07/11/21 1248     Clinical Impression Statement Completed anatomical measurements for RLE custom Elvarex classic knee length compression stocking with slant open toe and oblique top edge. Reapplied compression wraps. Pt educated on garment selection rational, measurement and fitting processes . Reappled multilayer compression wraps. Cont as per POC.    OT Occupational Profile and History Comprehensive Assessment- Review of records and extensive additional review of physical, cognitive, psychosocial history related to current functional performance    Occupational performance deficits (Please refer to evaluation for details): ADL's;IADL's;Rest and Sleep;Work;Leisure;Social Participation    Body Structure / Function / Physical Skills ADL;Edema;Skin integrity;Mobility;Pain;Decreased knowledge of precautions;Decreased knowledge of use of DME;IADL;Balance;Endurance;Strength;UE functional use;Obesity;Vision;Gait;ROM;Sensation    Rehab Potential Fair    Clinical Decision Making Several treatment options, min-mod task modification necessary    Comorbidities Affecting Occupational  Performance: Presence of comorbidities impacting occupational performance    Comorbidities impacting occupational performance description: Suspected lipedema. Possible CVI. Comorbidities that contribute to systemic fluid retention and lymphatic overload  include CKD, CHF, HTN, OSA, morbid obesity, chronic joint inflammation    Modification or Assistance to Complete Evaluation  Min-Moderate modification of tasks or assist with assess necessary to complete eval    OT Frequency 2x / week    OT Duration 12 weeks   and PRN.   OT Treatment/Interventions Self-care/ADL training;Therapeutic exercise;Manual Therapy;Energy conservation;Manual lymph drainage;Therapeutic activities;DME and/or AE instruction;Compression bandaging;Other (comment);Patient/family education   Fit with custom daytime compression garments and HOS devices designed to limit lympphedema progression and facilitate increased lymphatic circulation  during hours of sleep   Plan Intensive and Self-Management Complete Decongestive Therapy (CDT): Treat one leg at a time only to limit excessive fluid return to the heart and to limit falls risk. Provide Manual lymphatic Drainage (MLD), skin care with low ph lotion/ oil to increase skin hydrarion and mobility, therapeutic lymphatic pumping ther ex and multilayer gradient compression wraps to reduce limb volume. Lastly, fit appropriate compression garments to retain clinic al gains.    Recommended Other Services If cardiology is in agreement, fit with advanced sequential pneumatic compression device, or Flexitouch to decongest lymphatic fluid build up in fatty tissues under the skin.This device wil enable Pt to manage lipo-lymphedema at home over time with fluid decongestion and pain relief. OT to arrange trial with manufacturer's rep is in agreement.    Consulted and Agree with Plan of Care Patient             Patient will benefit from skilled therapeutic intervention in order to improve the  following deficits and impairments:   Body Structure / Function / Physical Skills: ADL, Edema, Skin integrity, Mobility, Pain, Decreased knowledge of precautions, Decreased knowledge of use of DME, IADL, Balance, Endurance, Strength, UE functional use, Obesity, Vision, Gait, ROM, Sensation       Visit Diagnosis: Lymphedema, not elsewhere classified    Problem List Patient Active Problem List   Diagnosis Date Noted   Chest pain 02/05/2021   SOB (shortness of breath) 02/05/2021   Leg swelling 02/05/2021   Nonrheumatic mitral  valve regurgitation 02/05/2021   Morbid obesity ([place]) 02/05/2021   S/P revision of total knee, right 05/05/2020   S/P right rotator cuff repair 07/30/2018   S/P left rotator cuff repair 11/23/2016   Benign essential hypertension 02/05/2003    Andrey Spearman, MS, OTR/L, [facility] 07/11/21 12:56 PM   [place] MAIN [facility] SERVICES [address] [place], [place], [address] Phone: [phone]   Fax:  [phone]  Name: JAMILA SLATTEN MRN: [id number] Date of Birth: October 08, 1956

## 2021-07-12 ENCOUNTER — Encounter: Payer: Medicare HMO | Admitting: Occupational Therapy

## 2021-07-13 ENCOUNTER — Ambulatory Visit: Payer: Medicare HMO

## 2021-07-13 ENCOUNTER — Ambulatory Visit: Payer: Medicare HMO | Admitting: Occupational Therapy

## 2021-07-13 ENCOUNTER — Other Ambulatory Visit: Payer: Self-pay

## 2021-07-13 VITALS — BP 121/66 | HR 72 | Resp 18 | Ht 65.0 in | Wt 280.0 lb

## 2021-07-13 DIAGNOSIS — I890 Lymphedema, not elsewhere classified: Secondary | ICD-10-CM | POA: Diagnosis not present

## 2021-07-13 DIAGNOSIS — R269 Unspecified abnormalities of gait and mobility: Secondary | ICD-10-CM

## 2021-07-13 DIAGNOSIS — R262 Difficulty in walking, not elsewhere classified: Secondary | ICD-10-CM

## 2021-07-13 DIAGNOSIS — R2681 Unsteadiness on feet: Secondary | ICD-10-CM

## 2021-07-13 DIAGNOSIS — M6281 Muscle weakness (generalized): Secondary | ICD-10-CM

## 2021-07-13 NOTE — Therapy (Signed)
[place] MAIN [facility] SERVICES [address] [place], [place], [address] Phone: [phone]   Fax:  [phone]  Occupational Therapy Treatment  Patient Details  Name: Kristine Rangel MRN: [id number] Date of Birth: Jul 01, 1956 Referring Provider (OT): Arvil Chaco, PA-C   Encounter Date: 07/13/2021   OT End of Session - 07/13/21 1125     Visit Number 13    Number of Visits 36    Date for OT Re-Evaluation 08/03/21    OT Start Time 1115    OT Stop Time 1208    OT Time Calculation (min) 53 min    Activity Tolerance Patient tolerated treatment well;No increased pain    Behavior During Therapy WFL for tasks assessed/performed             Past Medical History:  Diagnosis Date   Anxiety    Arthritis    Asthma    Bladder leak    Cerebral aneurysm    history-has coils   CHF (congestive heart failure) (HCC)    Chronic kidney disease    COPD (chronic obstructive pulmonary disease) (HCC)    Depression    Diabetes mellitus without complication (HCC)    Dyspnea    GERD (gastroesophageal reflux disease)    Glaucoma    Headache    History of kidney stones    Hyperlipidemia    Hypertension    MI (myocardial infarction) ([place])    unsure when   Neuropathy    Rotator cuff injury    Seasonal allergies    Stroke [facility])    TIA's    Past Surgical History:  Procedure Laterality Date   BRAIN SURGERY Right 2010   anuerysm repair   CARPAL TUNNEL RELEASE Right 06/12/2017   Procedure: CARPAL TUNNEL RELEASE;  Surgeon: Thornton Park, MD;  Location: [facility] ORS;  Service: Orthopedics;  Laterality: Right;   CHOLECYSTECTOMY     JOINT REPLACEMENT Bilateral    TOTAL KNEE REPLACEMENT   SHOULDER ARTHROSCOPY WITH OPEN ROTATOR CUFF REPAIR Left 11/23/2016   Procedure: SHOULDER ARTHROSCOPY WITH OPEN ROTATOR CUFF REPAIR, ARTHROSCOPIC SUBACROMIAL DECOMPRESSION, DISTAL CLAVICLE EXCISION;  Surgeon: Thornton Park, MD;  Location: [facility] ORS;  Service:  Orthopedics;  Laterality: Left;   SHOULDER ARTHROSCOPY WITH OPEN ROTATOR CUFF REPAIR Right 07/30/2018   Procedure: SHOULDER ARTHROSCOPY WITH OPEN ROTATOR CUFF REPAIR,SUBACROMINAL DECOMPRESSION AND DISTAL CLAVICLE EXCISION;  Surgeon: Thornton Park, MD;  Location: [facility] ORS;  Service: Orthopedics;  Laterality: Right;   TOTAL KNEE REVISION Right 05/05/2020   Procedure: right total knee arthroplasty revision;  Surgeon: Lovell Sheehan, MD;  Location: [facility] ORS;  Service: Orthopedics;  Laterality: Right;    There were no vitals filed for this visit.   Subjective Assessment - 07/13/21 1209     Subjective  Kristine Rangel presents for OT Rx visit 13/24visits  (authorized  by [facility]) to address bilateral lower extremity lipo-lymphedema. Pt reports 0/10 LE related leg pain. Pt is unaccompanied today.. She presents with compression wraps in place on the RLE.    Pertinent History OA knees and back,  OSA, HTN, morbid obesity (BMI 50.0-59.9) , hx anxiety and depression, hx cereberal aneurism,  glaucoma, hx MI, neuropathy, B rotator cuff repairs, Hx TIAs. FOLLOW LYMPHEDREMA PRECAUTIONS FOR diastolic dysfunction, asthma, CKD, dyspnea, CHF, COPD DM    Limitations difficulty walking, impaired standing balance, dyspnea, decreased dynamic balance, decreased LB strength, decreased A/PROM 2/2 body habitus and skin approximation,  increased supportive care needs and decreased quality of life,  impaired body image    Repetition Increases Symptoms    Patient Stated Goals "Get these legs straightened out make it not hurt so bad so I can do more."    Pain Onset More than a month ago   RTK 2016. S/P revision 2021. Chronic pain started ~ 2 yrs before revision and persists today                         OT Treatments/Exercises (OP) - 07/13/21 1210       ADLs   ADL Education Given Yes      Manual Therapy   Manual Therapy Edema management;Compression Bandaging    Edema Management attempted to decrease  height of loaned walker handles, but design does not allow for this. Pt continues to walk with shoulders elevated, which is exacerbated by this device. Pt cued to relax shoulders when walking when possible to reduce shoulder pain.    Manual Lymphatic Drainage (MLD) MLD to RLE utilizing short neck sequence, deep abdominal pathway, functional inguinal regional LN and proximal to distal J strokes covering thigh, knee and leg in sequence.    Compression Bandaging 3 layer gradiant compression wraps  from base of toes to tibial tuberosity using 10 and 12 " wide short stretch bandages over 0.4 cm thick, single layer of Rosidal fam over cotton stockinett.                    OT Education - 07/13/21 1212     Education Details Pt educated on compression garment fitting process. Pt edu for correct walker handle height and for correcting shoulder posture to neutral when using walker    Person(s) Educated Patient;Caregiver(s)    Methods Explanation;Demonstration;Handout    Comprehension Verbalized understanding;Returned demonstration;Need further instruction                 OT Long Term Goals - 06/27/21 1359       OT LONG TERM GOAL #1   Title Patient's intake functional score on the FOTO tool is 12 out of 100 (higher number = greater function). Given this patient's comorbidities she will experience at least an increase in function of 6 points, or higher, to increase level of independence with basic ADLs and functional ambulation and mobility.    Baseline 12/100    Time 12    Period Weeks    Status On-going   9th visit 06/20/21 score 12/100. Functional score unchanged frm intake. No measurable progress towards this goal to date.   Target Date 08/04/21      OT LONG TERM GOAL #2   Title Pt will be able to apply multi-layer, short stretch compression wraps to one leg at a time using correct gradient techniques with maximum caregiver assistance in an effort  to return the affected  limb(s)  to  premorbid size and shape, to limit pain and infection risk, and to improve functional mobility and ambulation  for ADLs.    Baseline dependent    Time 4    Period Days    Status Achieved   paid caregiver x 2 able to correctly apply wraps.     OT LONG TERM GOAL #3   Title Pt verbalize  4 primary lymphedema  precautions and prevention strategies using printed reference (modified independence) to reduce infection risk limit progression of lymphedema.    Baseline Max A    Time 4    Period Days    Status Achieved  OT LONG TERM GOAL #4   Title Pt will achieve at least 10% limb volume reduction bilaterally during Intensive Phase CDT to prevent re-accumulation of lymphatic congestion and progression of fibrosis, to limit infection risk, to improve functional ambulation and transfers, and to improve functional performance of ADLs.    Baseline dependent    Time 12    Period Weeks    Status Partially Met   RLE dec 9.84%. RLE GOAL MET   Target Date 08/04/21                   Plan - 07/13/21 1215     Clinical Impression Statement Continued MLD to right lower extremity.  Provided skin care throughout manual therapy and applied compression wraps as established.  Patient continues to make steady progress towards all OT goals.  Custom garments have been ordered and we are awaiting the delivery for fitting before moving onto left lower extremity for treatment.  Attempted to adjust patient's wheelchair to reduce height of walker handles, but design prevented this adjustment.  Patient cued on the way out of clinic to relax shoulders to neutral position and avoid elevation when possible to limit shoulder pain.  Continue as per plan of care.    OT Occupational Profile and History Comprehensive Assessment- Review of records and extensive additional review of physical, cognitive, psychosocial history related to current functional performance    Occupational performance deficits (Please refer to  evaluation for details): ADL's;IADL's;Rest and Sleep;Work;Leisure;Social Participation    Body Structure / Function / Physical Skills ADL;Edema;Skin integrity;Mobility;Pain;Decreased knowledge of precautions;Decreased knowledge of use of DME;IADL;Balance;Endurance;Strength;UE functional use;Obesity;Vision;Gait;ROM;Sensation    Rehab Potential Fair    Clinical Decision Making Several treatment options, min-mod task modification necessary    Comorbidities Affecting Occupational Performance: Presence of comorbidities impacting occupational performance    Comorbidities impacting occupational performance description: Suspected lipedema. Possible CVI. Comorbidities that contribute to systemic fluid retention and lymphatic overload  include CKD, CHF, HTN, OSA, morbid obesity, chronic joint inflammation    Modification or Assistance to Complete Evaluation  Min-Moderate modification of tasks or assist with assess necessary to complete eval    OT Frequency 2x / week    OT Duration 12 weeks   and PRN.   OT Treatment/Interventions Self-care/ADL training;Therapeutic exercise;Manual Therapy;Energy conservation;Manual lymph drainage;Therapeutic activities;DME and/or AE instruction;Compression bandaging;Other (comment);Patient/family education   Fit with custom daytime compression garments and HOS devices designed to limit lympphedema progression and facilitate increased lymphatic circulation  during hours of sleep   Plan Intensive and Self-Management Complete Decongestive Therapy (CDT): Treat one leg at a time only to limit excessive fluid return to the heart and to limit falls risk. Provide Manual lymphatic Drainage (MLD), skin care with low ph lotion/ oil to increase skin hydrarion and mobility, therapeutic lymphatic pumping ther ex and multilayer gradient compression wraps to reduce limb volume. Lastly, fit appropriate compression garments to retain clinic al gains.    Recommended Other Services If cardiology is in  agreement, fit with advanced sequential pneumatic compression device, or Flexitouch to decongest lymphatic fluid build up in fatty tissues under the skin.This device wil enable Pt to manage lipo-lymphedema at home over time with fluid decongestion and pain relief. OT to arrange trial with manufacturer's rep is in agreement.    Consulted and Agree with Plan of Care Patient             Patient will benefit from skilled therapeutic intervention in order to improve the following deficits and impairments:  Body Structure / Function / Physical Skills: ADL, Edema, Skin integrity, Mobility, Pain, Decreased knowledge of precautions, Decreased knowledge of use of DME, IADL, Balance, Endurance, Strength, UE functional use, Obesity, Vision, Gait, ROM, Sensation       Visit Diagnosis: Lymphedema, not elsewhere classified    Problem List Patient Active Problem List   Diagnosis Date Noted   Chest pain 02/05/2021   SOB (shortness of breath) 02/05/2021   Leg swelling 02/05/2021   Nonrheumatic mitral valve regurgitation 02/05/2021   Morbid obesity ([place]) 02/05/2021   S/P revision of total knee, right 05/05/2020   S/P right rotator cuff repair 07/30/2018   S/P left rotator cuff repair 11/23/2016   Benign essential hypertension 02/05/2003   Andrey Spearman, Kristine, OTR/L, [facility] 07/13/21 12:18 PM   [place] MAIN [facility] SERVICES [address] [place], [place], [address] Phone: [phone]   Fax:  [phone]  Name: Kristine Rangel MRN: [id number] Date of Birth: 1956/01/28

## 2021-07-13 NOTE — Therapy (Signed)
[facility] MAIN [facility] SERVICES [address] [place], [place], [address] Phone: [phone]   Fax:  [phone]  Physical Therapy Evaluation  Patient Details  Name: Kristine Rangel MRN: [id number] Date of Birth: 01-08-1956 Referring Provider (PT): Dr. Cassell Smiles  Encounter Date: 07/13/2021   PT End of Session - 07/13/21 1814     Visit Number 1    Number of Visits 25    Date for PT Re-Evaluation 10/05/21    Authorization Time Period Initial Cert period= 07/13/2021 through 10/05/2021    PT Start Time 0958    PT Stop Time 1059    PT Time Calculation (min) 61 min    Equipment Utilized During Treatment Gait belt    Activity Tolerance Patient tolerated treatment well;No increased pain;Patient limited by pain    Behavior During Therapy [facility] for tasks assessed/performed             Past Medical History:  Diagnosis Date   Anxiety    Arthritis    Asthma    Bladder leak    Cerebral aneurysm    history-has coils   CHF (congestive heart failure) (HCC)    Chronic kidney disease    COPD (chronic obstructive pulmonary disease) (HCC)    Depression    Diabetes mellitus without complication (HCC)    Dyspnea    GERD (gastroesophageal reflux disease)    Glaucoma    Headache    History of kidney stones    Hyperlipidemia    Hypertension    MI (myocardial infarction) (HCC)    unsure when   Neuropathy    Rotator cuff injury    Seasonal allergies    Stroke [facility])    TIA's    Past Surgical History:  Procedure Laterality Date   BRAIN SURGERY Right 2010   anuerysm repair   CARPAL TUNNEL RELEASE Right 06/12/2017   Procedure: CARPAL TUNNEL RELEASE;  Surgeon: Juanell Fairly, MD;  Location: [facility] ORS;  Service: Orthopedics;  Laterality: Right;   CHOLECYSTECTOMY     JOINT REPLACEMENT Bilateral    TOTAL KNEE REPLACEMENT   SHOULDER ARTHROSCOPY WITH OPEN ROTATOR CUFF REPAIR Left 11/23/2016   Procedure: SHOULDER ARTHROSCOPY WITH OPEN ROTATOR CUFF  REPAIR, ARTHROSCOPIC SUBACROMIAL DECOMPRESSION, DISTAL CLAVICLE EXCISION;  Surgeon: Juanell Fairly, MD;  Location: [facility] ORS;  Service: Orthopedics;  Laterality: Left;   SHOULDER ARTHROSCOPY WITH OPEN ROTATOR CUFF REPAIR Right 07/30/2018   Procedure: SHOULDER ARTHROSCOPY WITH OPEN ROTATOR CUFF REPAIR,SUBACROMINAL DECOMPRESSION AND DISTAL CLAVICLE EXCISION;  Surgeon: Juanell Fairly, MD;  Location: [facility] ORS;  Service: Orthopedics;  Laterality: Right;   TOTAL KNEE REVISION Right 05/05/2020   Procedure: right total knee arthroplasty revision;  Surgeon: Lyndle Herrlich, MD;  Location: [facility] ORS;  Service: Orthopedics;  Laterality: Right;    Vitals:   07/13/21 1008  BP: 121/66  Pulse: 72  Resp: 18  SpO2: 97%  Weight: 280 lb (127 kg)  Height: 5\' 5"  (1.651 m)      Subjective Assessment - 07/13/21 1012     Subjective Patient reports ongoing pain in right knee since her knee replacement in June of 2021. She reports she did go to rehab but her knee just never really improved. She reports that she is unable to drive and requires assist with all ADLs (has paid caregiver).    Pertinent History OA knees (had Left TKA in 2014 and right in 2016 and most recent revision right TKA on 05/05/2020)  and back,  OSA, HTN, morbid  obesity (BMI 50.0-59.9) , hx anxiety and depression, hx cereberal aneurism,  glaucoma, hx MI, neuropathy, B rotator cuff repairs, Hx TIAs. FOLLOW LYMPHEDREMA PRECAUTIONS FOR diastolic dysfunction, asthma, CKD, dyspnea, CHF, COPD DM    Limitations Sitting;Lifting;Standing;Walking;House hold activities    How long can you sit comfortably? varies on pain that day- it becomes stiff    How long can you stand comfortably? Not long <    How long can you walk comfortably? <5 min    Diagnostic tests None available    Patient Stated Goals To have decrease right knee pain and be able to drive and do more for myself and walk better.    Currently in Pain? Yes    Pain Score 8     Pain Location  Knee    Pain Orientation Right;Anterior    Pain Descriptors / Indicators Aching;Sore    Pain Type Chronic pain    Pain Onset More than a month ago    Pain Frequency Constant    Aggravating Factors  Walking, prolonged standing    Pain Relieving Factors rest    Effect of Pain on Daily Activities Decreased ability to perform ADLs and walking without support.               OBJECTIVE  MUSCULOSKELETAL: Tremor: Absent Bulk: Normal Tone: Normal, no spasticity, rigidity, or clonus No trophic changes noted to lower extremities. No ecchymosis, erythema, or edema noted around knee. No gross knee deformity noted  Posture Forward head and rounded shoulders    Gait Patient ambulated approx 80 feet with use of 4WW- step to gait with right LE bandaged (received Lymphedema) - antalgic with decreased heel to toe.   Palpation  pain to palpation along medial  joint line of  right knee. No pain over patellar tendon. No pain with palpation to quadriceps or hamstrings.  Strength R/L *2-/4 Hip flexion 2-/4 Hip external rotation 2-/4Hip internal rotation 2-/4Hip extension  *2-/4Hip abduction 2-/4 Hip adduction *2-/4 Knee extension *2-/4 Knee flexion 4/4 Ankle Dorsiflexion 4/4 Ankle Plantarflexion 4/4 Ankle Inversion 4/4 Ankle Eversion *indicates pain  AROM  Knee R/L Flexion: *95/101 Extension: 0/0 *indicates pain   FUNCTIONAL OUTCOME MEASURES   Results Comments  TUG 54.22 sec using 4WW Fall risk, in need of intervention  10 meter walk 0.32 m/s Fall risk   5 x sit to stand test               34.77 sec without UE   ASSESSMENT Pt is a pleasant 65 year-old female referred for knee pain. Patient had past right TKA in 2016 and revision performed on 05/05/2020 but reports her pain never really improved and that she is struggling to walk. PT examination reveals deficits in right knee ROM, decreased right LE strength, difficulty with all mobility and pain limited. Pt will benefit  from PT services to address deficits in  ROM, strength, mobility, and pain in order to return to full function at home with less knee pain.     Objective measurements completed on examination: See above findings.             PT Education - 07/13/21 1733     Education Details PT plan of care    Person(s) Educated Patient    Methods Explanation;Verbal cues    Comprehension Verbalized understanding;Need further instruction              PT Short Term Goals - 07/13/21 1736  PT SHORT TERM GOAL #1   Title Pt will be independent with HEP in order to decrease ankle pain and increase strength in order to improve pain-free function at home and work.    Baseline 07/13/2021- Patient has no formal HEP in place and currently pain limited and not exercising.    Time 6    Period Weeks    Status New    Target Date 08/24/21               PT Long Term Goals - 07/13/21 1737       PT LONG TERM GOAL #1   Title Pt will improve FOTO to target score of 41  to display perceived improvements in ability to complete ADL's.    Baseline 8/31/022= 21    Time 12    Period Weeks    Status New    Target Date 10/05/21      PT LONG TERM GOAL #2   Title Pt will decrease worst pain as reported on NPRS by at least 3 points (initially 8/10 Right knee)  in order to demonstrate clinically significant reduction in ankle/foot pain.    Baseline 07/13/2021= 8/10    Time 12    Period Weeks    Status New    Target Date 10/05/21      PT LONG TERM GOAL #3   Title Pt will increase Right knee strength of  by at least 1/2 MMT grade in order to demonstrate improvement in strength and function    Baseline 07/13/2021= Right knee flex/ext = 2-/5 *pain limited    Time 12    Status New    Target Date 10/05/21      PT LONG TERM GOAL #4   Title Pt will decrease 5TSTS by at least 8 seconds in order to demonstrate clinically significant improvement in LE strength.    Baseline 07/13/2021=34.77 without UE  support    Time 12    Period Weeks    Status New    Target Date 10/05/21      PT LONG TERM GOAL #5   Title Patient will present with improved right Knee flex > 105 deg AROM for improved ROM with all functional activities.    Baseline 07/13/2021= Right knee AROM = 95 deg    Time 12    Period Weeks    Status New    Target Date 10/05/21      Additional Long Term Goals   Additional Long Term Goals Yes      PT LONG TERM GOAL #6   Title Pt will decrease TUG to below 31 seconds/decrease in order to demonstrate decreased fall risk.    Baseline 07/13/2021= 54.22 sec using 4WW    Time 12    Period Weeks    Status New    Target Date 10/05/21      PT LONG TERM GOAL #7   Title Pt will increase 10MWT by at least 0.2 m/s in order to demonstrate clinically significant improvement in community ambulation.    Baseline 07/13/2021= 0.32 m/s using 4WW    Time 12    Period Weeks    Status New    Target Date 10/05/21                    Plan - 07/13/21 1809     Clinical Impression Statement Pt is a pleasant 65 year-old female referred for knee pain. Patient had past right TKA in 2016 and revision performed  on 05/05/2020 but reports her pain never really improved and that she is struggling to walk. PT examination reveals deficits in right knee ROM, decreased right LE strength, difficulty with all mobility and pain limited. Pt will benefit from PT services to address deficits in  ROM, strength, mobility, and pain in order to return to full function at home with less knee pain.    Personal Factors and Comorbidities Comorbidity 3+    Comorbidities HTN, Diabetes, Lymphedema    Examination-Activity Limitations Bathing;Bed Mobility;Bend;Caring for Others;Carry;Dressing;Lift;Sleep;Squat;Stairs;Stand;Transfers    Examination-Participation Restrictions Cleaning;Community Activity;Driving;Laundry;Meal Prep    Stability/Clinical Decision Making Stable/Uncomplicated    Rehab Potential Good    PT  Frequency 2x / week    PT Duration 12 weeks    PT Treatment/Interventions ADLs/Self Care Home Management;Cryotherapy;Moist Heat;DME Instruction;Gait training;Stair training;Functional mobility training;Therapeutic activities;Therapeutic exercise;Balance training;Neuromuscular re-education;Patient/family education;Orthotic Fit/Training;Manual techniques;Compression bandaging;Scar mobilization;Passive range of motion;Dry needling;Taping    PT Next Visit Plan Instruct patient in ROM, Strengthening and intiate home program exercises.    PT Home Exercise Plan Will initiate next visit    Recommended Other Services Patient already receiving OT services for lymphedema management    Consulted and Agree with Plan of Care Patient             Patient will benefit from skilled therapeutic intervention in order to improve the following deficits and impairments:  Abnormal gait, Decreased balance, Decreased activity tolerance, Decreased endurance, Decreased mobility, Decreased range of motion, Decreased skin integrity, Decreased strength, Difficulty walking, Hypomobility, Impaired flexibility, Obesity, Pain  Visit Diagnosis: Abnormality of gait and mobility  Difficulty in walking, not elsewhere classified  Muscle weakness (generalized)  Unsteadiness on feet     Problem List Patient Active Problem List   Diagnosis Date Noted   Chest pain 02/05/2021   SOB (shortness of breath) 02/05/2021   Leg swelling 02/05/2021   Nonrheumatic mitral valve regurgitation 02/05/2021   Morbid obesity (HCC) 02/05/2021   S/P revision of total knee, right 05/05/2020   S/P right rotator cuff repair 07/30/2018   S/P left rotator cuff repair 11/23/2016   Benign essential hypertension 02/05/2003    Lenda Kelp, PT 07/13/2021, 6:19 PM  [facility] MAIN [facility] SERVICES [address] [place], [place], [address] Phone: [phone]   Fax:  [phone]  Name: FRANCOISE CHOJNOWSKI MRN: [id number] Date of Birth: Apr 30, 1956

## 2021-07-15 ENCOUNTER — Telehealth: Payer: Self-pay | Admitting: Physician Assistant

## 2021-07-15 NOTE — Telephone Encounter (Signed)
Received request for orders and records for compression hose. Patient is seeing OT services here at [facility] and will place this form and request on providers desk Leafy Kindle PA-C for her review and completion.

## 2021-07-18 NOTE — Telephone Encounter (Signed)
Form completed for Medicaid and faxed with 1 set office visit notes within specified time frame to [phone] with confirmation received.

## 2021-07-20 ENCOUNTER — Other Ambulatory Visit: Payer: Self-pay

## 2021-07-20 ENCOUNTER — Encounter: Payer: Self-pay | Admitting: Physical Therapy

## 2021-07-20 ENCOUNTER — Ambulatory Visit: Payer: Medicare HMO | Attending: Physician Assistant | Admitting: Occupational Therapy

## 2021-07-20 ENCOUNTER — Ambulatory Visit: Payer: Medicare HMO | Admitting: Physical Therapy

## 2021-07-20 DIAGNOSIS — R269 Unspecified abnormalities of gait and mobility: Secondary | ICD-10-CM | POA: Diagnosis present

## 2021-07-20 DIAGNOSIS — R2681 Unsteadiness on feet: Secondary | ICD-10-CM | POA: Insufficient documentation

## 2021-07-20 DIAGNOSIS — M6281 Muscle weakness (generalized): Secondary | ICD-10-CM | POA: Insufficient documentation

## 2021-07-20 DIAGNOSIS — R262 Difficulty in walking, not elsewhere classified: Secondary | ICD-10-CM | POA: Diagnosis present

## 2021-07-20 DIAGNOSIS — I890 Lymphedema, not elsewhere classified: Secondary | ICD-10-CM | POA: Diagnosis not present

## 2021-07-20 NOTE — Therapy (Signed)
[place] [facility] MAIN [facility] SERVICES [address] [place], [place], [address] Phone: [phone]   Fax:  [phone]  Physical Therapy Treatment  Patient Details  Name: Kristine Rangel MRN: [id number] Date of Birth: 07-26-1956 Referring Provider (PT): Dr. Cassell Smiles   Encounter Date: 07/20/2021   PT End of Session - 07/20/21 0935     Visit Number 2    Number of Visits 25    Date for PT Re-Evaluation 10/05/21    Authorization Time Period Initial Cert period= 07/13/2021 through 10/05/2021    PT Start Time 0847    PT Stop Time 0932    PT Time Calculation (min) 45 min    Equipment Utilized During Treatment Gait belt    Activity Tolerance Patient tolerated treatment well;No increased pain;Patient limited by pain    Behavior During Therapy [facility] for tasks assessed/performed             Past Medical History:  Diagnosis Date   Anxiety    Arthritis    Asthma    Bladder leak    Cerebral aneurysm    history-has coils   CHF (congestive heart failure) (HCC)    Chronic kidney disease    COPD (chronic obstructive pulmonary disease) (HCC)    Depression    Diabetes mellitus without complication (HCC)    Dyspnea    GERD (gastroesophageal reflux disease)    Glaucoma    Headache    History of kidney stones    Hyperlipidemia    Hypertension    MI (myocardial infarction) (HCC)    unsure when   Neuropathy    Rotator cuff injury    Seasonal allergies    Stroke [facility])    TIA's    Past Surgical History:  Procedure Laterality Date   BRAIN SURGERY Right 2010   anuerysm repair   CARPAL TUNNEL RELEASE Right 06/12/2017   Procedure: CARPAL TUNNEL RELEASE;  Surgeon: Juanell Fairly, MD;  Location: [facility] ORS;  Service: Orthopedics;  Laterality: Right;   CHOLECYSTECTOMY     JOINT REPLACEMENT Bilateral    TOTAL KNEE REPLACEMENT   SHOULDER ARTHROSCOPY WITH OPEN ROTATOR CUFF REPAIR Left 11/23/2016   Procedure: SHOULDER ARTHROSCOPY WITH OPEN ROTATOR CUFF  REPAIR, ARTHROSCOPIC SUBACROMIAL DECOMPRESSION, DISTAL CLAVICLE EXCISION;  Surgeon: Juanell Fairly, MD;  Location: [facility] ORS;  Service: Orthopedics;  Laterality: Left;   SHOULDER ARTHROSCOPY WITH OPEN ROTATOR CUFF REPAIR Right 07/30/2018   Procedure: SHOULDER ARTHROSCOPY WITH OPEN ROTATOR CUFF REPAIR,SUBACROMINAL DECOMPRESSION AND DISTAL CLAVICLE EXCISION;  Surgeon: Juanell Fairly, MD;  Location: [facility] ORS;  Service: Orthopedics;  Laterality: Right;   TOTAL KNEE REVISION Right 05/05/2020   Procedure: right total knee arthroplasty revision;  Surgeon: Lyndle Herrlich, MD;  Location: [facility] ORS;  Service: Orthopedics;  Laterality: Right;    There were no vitals filed for this visit.   Subjective Assessment - 07/20/21 0900     Subjective Patient reports increased RLE pain which started yesterday.    Pertinent History OA knees (had Left TKA in 2014 and right in 2016 and most recent revision right TKA on 05/05/2020)  and back,  OSA, HTN, morbid obesity (BMI 50.0-59.9) , hx anxiety and depression, hx cereberal aneurism,  glaucoma, hx MI, neuropathy, B rotator cuff repairs, Hx TIAs. FOLLOW LYMPHEDREMA PRECAUTIONS FOR diastolic dysfunction, asthma, CKD, dyspnea, CHF, COPD DM    Limitations Sitting;Lifting;Standing;Walking;House hold activities    How long can you sit comfortably? varies on pain that day- it becomes stiff  How long can you stand comfortably? Not long <    How long can you walk comfortably? <5 min    Diagnostic tests None available    Patient Stated Goals To have decrease right knee pain and be able to drive and do more for myself and walk better.    Currently in Pain? Yes    Pain Score 10-Worst pain ever    Pain Location Leg    Pain Orientation Right;Posterior    Pain Descriptors / Indicators Aching;Sore    Pain Type Chronic pain    Pain Onset More than a month ago    Pain Frequency Constant    Aggravating Factors  walking/prolonged standing; worse this weekend with rainy weekend     Pain Relieving Factors rest/heat/pain meds    Effect of Pain on Daily Activities decreased activity tolerance;    Multiple Pain Sites No              TREATMENT: Patient required min A to transition sit to supine with moist heat under RLE posterior thigh for pain relief concurrent with exercise  Instructed patient in LE strengthening/ROM exercise RLE heel slides x10 reps in tolerable ROM RLE SLR hip flexion AAROM 2x10 with cues for increased core stabilization and to keep knee straight for better strengthening and motor control;   Hooklying: Hip flexion march 2x10 with cues for core stabilization for less strain on low back BLE hip abduction/ER x10 with green tband with cues to increase ROM for better gluteal strengthening  Instructed patient in seated exercise: LAQ with ankle DF x10 reps with cues for ankle DF to improve stretch in calf and increase ROM Hip flexion march x10 reps each LE with cues to avoid trunk lean and isolate hip flexor activation  Provided written instruction for HEP- see patient instructions  Patient limited this session with increased posterior RLE pain. She denies any increase in pain. PT utilized heat and instructed patient in gentle ROM exercise for better tolerance;                          Upper Extremity Functional Index Score :   /80   PT Education - 07/20/21 0935     Education Details ROM/HEP    Person(s) Educated Patient    Methods Explanation;Verbal cues    Comprehension Verbalized understanding;Returned demonstration;Verbal cues required;Need further instruction              PT Short Term Goals - 07/13/21 1736       PT SHORT TERM GOAL #1   Title Pt will be independent with HEP in order to decrease ankle pain and increase strength in order to improve pain-free function at home and work.    Baseline 07/13/2021- Patient has no formal HEP in place and currently pain limited and not exercising.    Time 6     Period Weeks    Status New    Target Date 08/24/21               PT Long Term Goals - 07/13/21 1737       PT LONG TERM GOAL #1   Title Pt will improve FOTO to target score of 41  to display perceived improvements in ability to complete ADL's.    Baseline 8/31/022= 21    Time 12    Period Weeks    Status New    Target Date 10/05/21      PT  LONG TERM GOAL #2   Title Pt will decrease worst pain as reported on NPRS by at least 3 points (initially 8/10 Right knee)  in order to demonstrate clinically significant reduction in ankle/foot pain.    Baseline 07/13/2021= 8/10    Time 12    Period Weeks    Status New    Target Date 10/05/21      PT LONG TERM GOAL #3   Title Pt will increase Right knee strength of  by at least 1/2 MMT grade in order to demonstrate improvement in strength and function    Baseline 07/13/2021= Right knee flex/ext = 2-/5 *pain limited    Time 12    Status New    Target Date 10/05/21      PT LONG TERM GOAL #4   Title Pt will decrease 5TSTS by at least 8 seconds in order to demonstrate clinically significant improvement in LE strength.    Baseline 07/13/2021=34.77 without UE support    Time 12    Period Weeks    Status New    Target Date 10/05/21      PT LONG TERM GOAL #5   Title Patient will present with improved right Knee flex > 105 deg AROM for improved ROM with all functional activities.    Baseline 07/13/2021= Right knee AROM = 95 deg    Time 12    Period Weeks    Status New    Target Date 10/05/21      Additional Long Term Goals   Additional Long Term Goals Yes      PT LONG TERM GOAL #6   Title Pt will decrease TUG to below 31 seconds/decrease in order to demonstrate decreased fall risk.    Baseline 07/13/2021= 54.22 sec using 4WW    Time 12    Period Weeks    Status New    Target Date 10/05/21      PT LONG TERM GOAL #7   Title Pt will increase 10MWT by at least 0.2 m/s in order to demonstrate clinically significant improvement in  community ambulation.    Baseline 07/13/2021= 0.32 m/s using 4WW    Time 12    Period Weeks    Status New    Target Date 10/05/21                   Plan - 07/20/21 0935     Clinical Impression Statement Patient reports high level of pain this session, likely due to weather change with increased rain. Patient instructed in gentle ROM for better tolerance. Educated patient in proper positioning with exercise technique to avoid strain on low back and improve muscle activation. She did require min VCs to slow down LE exercise for better motor control. Utilized moist heat to posterior RLE for better tolerance and to help alleviate pain. While patient still reports high level pain at end of session, she denies any increase in pain during exercise. Instructed patient in seated HEP for better tolerance and adherence. Patient would benefit from additional skilled PT Intervention to improve strength and mobility while reducing RLE pain;    Personal Factors and Comorbidities Comorbidity 3+    Comorbidities HTN, Diabetes, Lymphedema    Examination-Activity Limitations Bathing;Bed Mobility;Bend;Caring for Others;Carry;Dressing;Lift;Sleep;Squat;Stairs;Stand;Transfers    Examination-Participation Restrictions Cleaning;Community Activity;Driving;Laundry;Meal Prep    Stability/Clinical Decision Making Stable/Uncomplicated    Rehab Potential Good    PT Frequency 2x / week    PT Duration 12 weeks    PT Treatment/Interventions  ADLs/Self Care Home Management;Cryotherapy;Moist Heat;DME Instruction;Gait training;Stair training;Functional mobility training;Therapeutic activities;Therapeutic exercise;Balance training;Neuromuscular re-education;Patient/family education;Orthotic Fit/Training;Manual techniques;Compression bandaging;Scar mobilization;Passive range of motion;Dry needling;Taping    PT Next Visit Plan Instruct patient in ROM, Strengthening and intiate home program exercises.    PT Home Exercise Plan  Will initiate next visit    Consulted and Agree with Plan of Care Patient             Patient will benefit from skilled therapeutic intervention in order to improve the following deficits and impairments:  Abnormal gait, Decreased balance, Decreased activity tolerance, Decreased endurance, Decreased mobility, Decreased range of motion, Decreased skin integrity, Decreased strength, Difficulty walking, Hypomobility, Impaired flexibility, Obesity, Pain  Visit Diagnosis: Abnormality of gait and mobility  Difficulty in walking, not elsewhere classified  Muscle weakness (generalized)  Unsteadiness on feet     Problem List Patient Active Problem List   Diagnosis Date Noted   Chest pain 02/05/2021   SOB (shortness of breath) 02/05/2021   Leg swelling 02/05/2021   Nonrheumatic mitral valve regurgitation 02/05/2021   Morbid obesity (HCC) 02/05/2021   S/P revision of total knee, right 05/05/2020   S/P right rotator cuff repair 07/30/2018   S/P left rotator cuff repair 11/23/2016   Benign essential hypertension 02/05/2003    Aijah Lattner, PT DPT 07/20/2021, 9:37 AM  [place] [facility] MAIN [facility] SERVICES [address] [place], [place], [address] Phone: [phone]   Fax:  [phone]  Name: JOZEY JANCO MRN: [id number] Date of Birth: 04/01/1956

## 2021-07-20 NOTE — Patient Instructions (Signed)
Access Code: [email or web address] URL: [email or web address] Date: 07/20/2021 Prepared by: Zettie Pho  Exercises Seated Long Arc Quad - 3 x daily - 7 x weekly - 1 sets - 10 reps Seated Hip Flexion March with Ankle Weights - 3 x daily - 7 x weekly - 1 sets - 10 reps

## 2021-07-21 NOTE — Therapy (Signed)
[place] MAIN [facility] SERVICES [address] [place], [place], [address] Phone: [phone]   Fax:  [phone]  Occupational Therapy Treatment  Patient Details  Name: Kristine Rangel MRN: [id number] Date of Birth: 1956-09-09 Referring Provider (OT): Arvil Chaco, PA-C   Encounter Date: 07/20/2021   OT End of Session - 07/20/21 1030     Visit Number 14    Number of Visits 36    Date for OT Re-Evaluation 08/03/21    OT Start Time 1020    OT Stop Time 1120    OT Time Calculation (min) 60 min    Activity Tolerance Patient tolerated treatment well;No increased pain    Behavior During Therapy WFL for tasks assessed/performed             Past Medical History:  Diagnosis Date   Anxiety    Arthritis    Asthma    Bladder leak    Cerebral aneurysm    history-has coils   CHF (congestive heart failure) (HCC)    Chronic kidney disease    COPD (chronic obstructive pulmonary disease) (HCC)    Depression    Diabetes mellitus without complication (HCC)    Dyspnea    GERD (gastroesophageal reflux disease)    Glaucoma    Headache    History of kidney stones    Hyperlipidemia    Hypertension    MI (myocardial infarction) ([place])    unsure when   Neuropathy    Rotator cuff injury    Seasonal allergies    Stroke [facility])    TIA's    Past Surgical History:  Procedure Laterality Date   BRAIN SURGERY Right 2010   anuerysm repair   CARPAL TUNNEL RELEASE Right 06/12/2017   Procedure: CARPAL TUNNEL RELEASE;  Surgeon: Thornton Park, MD;  Location: [facility] ORS;  Service: Orthopedics;  Laterality: Right;   CHOLECYSTECTOMY     JOINT REPLACEMENT Bilateral    TOTAL KNEE REPLACEMENT   SHOULDER ARTHROSCOPY WITH OPEN ROTATOR CUFF REPAIR Left 11/23/2016   Procedure: SHOULDER ARTHROSCOPY WITH OPEN ROTATOR CUFF REPAIR, ARTHROSCOPIC SUBACROMIAL DECOMPRESSION, DISTAL CLAVICLE EXCISION;  Surgeon: Thornton Park, MD;  Location: [facility] ORS;  Service:  Orthopedics;  Laterality: Left;   SHOULDER ARTHROSCOPY WITH OPEN ROTATOR CUFF REPAIR Right 07/30/2018   Procedure: SHOULDER ARTHROSCOPY WITH OPEN ROTATOR CUFF REPAIR,SUBACROMINAL DECOMPRESSION AND DISTAL CLAVICLE EXCISION;  Surgeon: Thornton Park, MD;  Location: [facility] ORS;  Service: Orthopedics;  Laterality: Right;   TOTAL KNEE REVISION Right 05/05/2020   Procedure: right total knee arthroplasty revision;  Surgeon: Lovell Sheehan, MD;  Location: [facility] ORS;  Service: Orthopedics;  Laterality: Right;    There were no vitals filed for this visit.   Subjective Assessment - 07/20/21 1312     Subjective  [name] presents for OT Rx visit 14/24visits   to address bilateral lower extremity lipo-lymphedema. Pt reports 0/10 LE related leg pain. Pt is accompanied by one of her 2 paid caregivers this morning, who continues to assist Pt with gradient compression wrapping between visits. She presents with compression wraps in place on the RLE.    Pertinent History OA knees and back,  OSA, HTN, morbid obesity (BMI 50.0-59.9) , hx anxiety and depression, hx cereberal aneurism,  glaucoma, hx MI, neuropathy, B rotator cuff repairs, Hx TIAs. FOLLOW LYMPHEDREMA PRECAUTIONS FOR diastolic dysfunction, asthma, CKD, dyspnea, CHF, COPD DM    Limitations difficulty walking, impaired standing balance, dyspnea, decreased dynamic balance, decreased LB strength, decreased A/PROM 2/2  body habitus and skin approximation,  increased supportive care needs and decreased quality of life, impaired body image    Repetition Increases Symptoms    Patient Stated Goals "Get these legs straightened out make it not hurt so bad so I can do more."    Pain Onset More than a month ago   RTK 2016. S/P revision 2021. Chronic pain started ~ 2 yrs before revision and persists today                         OT Treatments/Exercises (OP) - 07/21/21 1314       Manual Therapy   Manual Therapy Edema management;Compression Bandaging     Manual Lymphatic Drainage (MLD) MLD to RLE utilizing short neck sequence, deep abdominal pathway, functional inguinal regional LN and proximal to distal J strokes covering thigh, knee and leg in sequence.    Compression Bandaging 3 layer gradiant compression wraps  from base of toes to tibial tuberosity using 10 and 12 " wide short stretch bandages over 0.4 cm thick, single layer of Rosidal fam over cotton stockinett.                    OT Education - 07/20/21 1311     Education Details Continued Pt/ CG edu for lymphedema self care  and home program throughout session. Topics include multilayer, gradient compression wrapping, simple self-MLD, therapeutic lymphatic pumping exercises, skin/nail care, risk reduction factors and LE precautions, compression garments/recommendations and wear and care schedule and compression garment donning / doffing using assistive devices. All questions answered to the Pt's satisfaction, and Pt demonstrates understanding by report.    Person(s) Educated Patient;Caregiver(s)    Methods Explanation;Demonstration;Handout    Comprehension Verbalized understanding;Returned demonstration;Need further instruction                 OT Long Term Goals - 06/27/21 1359       OT LONG TERM GOAL #1   Title Patient's intake functional score on the FOTO tool is 12 out of 100 (higher number = greater function). Given this patient's comorbidities she will experience at least an increase in function of 6 points, or higher, to increase level of independence with basic ADLs and functional ambulation and mobility.    Baseline 12/100    Time 12    Period Weeks    Status On-going   9th visit 06/20/21 score 12/100. Functional score unchanged frm intake. No measurable progress towards this goal to date.   Target Date 08/04/21      OT LONG TERM GOAL #2   Title Pt will be able to apply multi-layer, short stretch compression wraps to one leg at a time using correct gradient  techniques with maximum caregiver assistance in an effort  to return the affected  limb(s)  to premorbid size and shape, to limit pain and infection risk, and to improve functional mobility and ambulation  for ADLs.    Baseline dependent    Time 4    Period Days    Status Achieved   paid caregiver x 2 able to correctly apply wraps.     OT LONG TERM GOAL #3   Title Pt verbalize  4 primary lymphedema  precautions and prevention strategies using printed reference (modified independence) to reduce infection risk limit progression of lymphedema.    Baseline Max A    Time 4    Period Days    Status Achieved  OT LONG TERM GOAL #4   Title Pt will achieve at least 10% limb volume reduction bilaterally during Intensive Phase CDT to prevent re-accumulation of lymphatic congestion and progression of fibrosis, to limit infection risk, to improve functional ambulation and transfers, and to improve functional performance of ADLs.    Baseline dependent    Time 12    Period Weeks    Status Partially Met   RLE dec 9.84%. RLE GOAL MET   Target Date 08/04/21                   Plan - 07/21/21 1306     Clinical Impression Statement Pt obtained body shaper shorts as instructed to assist with containment of fatty fibrosis at  abdomen, hips and proximal thighs. Provided RLE MLD, skin care and gradient compression wrap from base of toes to L leg as estabished. Pt continues to do well w/out foot wrapped. No increased distal swelling observed. Fit LLE compression stocking when delivered , then transition to RLE CDT. Cont as per POC.    OT Occupational Profile and History Comprehensive Assessment- Review of records and extensive additional review of physical, cognitive, psychosocial history related to current functional performance    Occupational performance deficits (Please refer to evaluation for details): ADL's;IADL's;Rest and Sleep;Work;Leisure;Social Participation    Body Structure / Function /  Physical Skills ADL;Edema;Skin integrity;Mobility;Pain;Decreased knowledge of precautions;Decreased knowledge of use of DME;IADL;Balance;Endurance;Strength;UE functional use;Obesity;Vision;Gait;ROM;Sensation    Rehab Potential Fair    Clinical Decision Making Several treatment options, min-mod task modification necessary    Comorbidities Affecting Occupational Performance: Presence of comorbidities impacting occupational performance    Comorbidities impacting occupational performance description: Suspected lipedema. Possible CVI. Comorbidities that contribute to systemic fluid retention and lymphatic overload  include CKD, CHF, HTN, OSA, morbid obesity, chronic joint inflammation    Modification or Assistance to Complete Evaluation  Min-Moderate modification of tasks or assist with assess necessary to complete eval    OT Frequency 2x / week    OT Duration 12 weeks   and PRN.   OT Treatment/Interventions Self-care/ADL training;Therapeutic exercise;Manual Therapy;Energy conservation;Manual lymph drainage;Therapeutic activities;DME and/or AE instruction;Compression bandaging;Other (comment);Patient/family education   Fit with custom daytime compression garments and HOS devices designed to limit lympphedema progression and facilitate increased lymphatic circulation  during hours of sleep   Plan Intensive and Self-Management Complete Decongestive Therapy (CDT): Treat one leg at a time only to limit excessive fluid return to the heart and to limit falls risk. Provide Manual lymphatic Drainage (MLD), skin care with low ph lotion/ oil to increase skin hydrarion and mobility, therapeutic lymphatic pumping ther ex and multilayer gradient compression wraps to reduce limb volume. Lastly, fit appropriate compression garments to retain clinic al gains.    Recommended Other Services If cardiology is in agreement, fit with advanced sequential pneumatic compression device, or Flexitouch to decongest lymphatic fluid build  up in fatty tissues under the skin.This device wil enable Pt to manage lipo-lymphedema at home over time with fluid decongestion and pain relief. OT to arrange trial with manufacturer's rep is in agreement.    Consulted and Agree with Plan of Care Patient             Patient will benefit from skilled therapeutic intervention in order to improve the following deficits and impairments:   Body Structure / Function / Physical Skills: ADL, Edema, Skin integrity, Mobility, Pain, Decreased knowledge of precautions, Decreased knowledge of use of DME, IADL, Balance, Endurance, Strength, UE functional use, Obesity,  Vision, Gait, ROM, Sensation       Visit Diagnosis: Lymphedema, not elsewhere classified    Problem List Patient Active Problem List   Diagnosis Date Noted   Chest pain 02/05/2021   SOB (shortness of breath) 02/05/2021   Leg swelling 02/05/2021   Nonrheumatic mitral valve regurgitation 02/05/2021   Morbid obesity ([place]) 02/05/2021   S/P revision of total knee, right 05/05/2020   S/P right rotator cuff repair 07/30/2018   S/P left rotator cuff repair 11/23/2016   Benign essential hypertension 02/05/2003   Andrey Spearman, MS, OTR/L, CLT-LANA 07/21/21 1:16 PM   [place] MAIN [facility] SERVICES [address] [place], [place], [address] Phone: [phone]   Fax:  [phone]  Name: ARISBEL MAIONE MRN: [id number] Date of Birth: 1956-01-25

## 2021-07-21 NOTE — Patient Instructions (Signed)

## 2021-07-25 ENCOUNTER — Ambulatory Visit: Payer: Medicare HMO | Admitting: Occupational Therapy

## 2021-07-25 ENCOUNTER — Other Ambulatory Visit: Payer: Self-pay

## 2021-07-25 DIAGNOSIS — I890 Lymphedema, not elsewhere classified: Secondary | ICD-10-CM | POA: Diagnosis not present

## 2021-07-25 NOTE — Therapy (Signed)
[place] MAIN [facility] SERVICES [address] [place], [place], [address] Phone: [phone]   Fax:  [phone]  Occupational Therapy Treatment  Patient Details  Name: Kristine Rangel MRN: [id number] Date of Birth: 09-05-1956 Referring Provider (OT): Kristine Chaco, PA-C   Encounter Date: 07/25/2021   OT End of Session - 07/25/21 1115     Visit Number 15    Number of Visits 36    Date for OT Re-Evaluation 08/03/21    OT Start Time 1105    OT Stop Time 1205    OT Time Calculation (min) 60 min    Activity Tolerance Patient tolerated treatment well;No increased pain;Patient limited by pain    Behavior During Therapy [facility] for tasks assessed/performed             Past Medical History:  Diagnosis Date   Anxiety    Arthritis    Asthma    Bladder leak    Cerebral aneurysm    history-has coils   CHF (congestive heart failure) (HCC)    Chronic kidney disease    COPD (chronic obstructive pulmonary disease) (HCC)    Depression    Diabetes mellitus without complication (HCC)    Dyspnea    GERD (gastroesophageal reflux disease)    Glaucoma    Headache    History of kidney stones    Hyperlipidemia    Hypertension    MI (myocardial infarction) ([place])    unsure when   Neuropathy    Rotator cuff injury    Seasonal allergies    Stroke [facility])    TIA's    Past Surgical History:  Procedure Laterality Date   BRAIN SURGERY Right 2010   anuerysm repair   CARPAL TUNNEL RELEASE Right 06/12/2017   Procedure: CARPAL TUNNEL RELEASE;  Surgeon: Thornton Park, MD;  Location: [facility] ORS;  Service: Orthopedics;  Laterality: Right;   CHOLECYSTECTOMY     JOINT REPLACEMENT Bilateral    TOTAL KNEE REPLACEMENT   SHOULDER ARTHROSCOPY WITH OPEN ROTATOR CUFF REPAIR Left 11/23/2016   Procedure: SHOULDER ARTHROSCOPY WITH OPEN ROTATOR CUFF REPAIR, ARTHROSCOPIC SUBACROMIAL DECOMPRESSION, DISTAL CLAVICLE EXCISION;  Surgeon: Thornton Park, MD;  Location:  [facility] ORS;  Service: Orthopedics;  Laterality: Left;   SHOULDER ARTHROSCOPY WITH OPEN ROTATOR CUFF REPAIR Right 07/30/2018   Procedure: SHOULDER ARTHROSCOPY WITH OPEN ROTATOR CUFF REPAIR,SUBACROMINAL DECOMPRESSION AND DISTAL CLAVICLE EXCISION;  Surgeon: Thornton Park, MD;  Location: [facility] ORS;  Service: Orthopedics;  Laterality: Right;   TOTAL KNEE REVISION Right 05/05/2020   Procedure: right total knee arthroplasty revision;  Surgeon: Lovell Sheehan, MD;  Location: [facility] ORS;  Service: Orthopedics;  Laterality: Right;    There were no vitals filed for this visit.   Subjective Assessment - 07/25/21 1116     Subjective  [name] presents for OT Rx visit 15/24visits   to address bilateral lower extremity lipo-lymphedema. Pt reports 7/10 LE related leg and knee pain. Pt is un accompanied  She presents with compression wraps in place on the RLE. nPt applied wraps herself without assistance over the weekend. We discussed difference between basic and advanced sequential LE "pump". OT resubmitted request to manufacturer's rep  to check benefits again since Medicare rules have recently changed.    Pertinent History OA knees and back,  OSA, HTN, morbid obesity (BMI 50.0-59.9) , hx anxiety and depression, hx cereberal aneurism,  glaucoma, hx MI, neuropathy, B rotator cuff repairs, Hx TIAs. FOLLOW LYMPHEDREMA PRECAUTIONS FOR diastolic dysfunction, asthma, CKD,  dyspnea, CHF, COPD DM    Limitations difficulty walking, impaired standing balance, dyspnea, decreased dynamic balance, decreased LB strength, decreased A/PROM 2/2 body habitus and skin approximation,  increased supportive care needs and decreased quality of life, impaired body image    Repetition Increases Symptoms    Patient Stated Goals "Get these legs straightened out make it not hurt so bad so I can do more."    Pain Onset More than a month ago   RTK 2016. S/P revision 2021. Chronic pain started ~ 2 yrs before revision and persists today                          OT Treatments/Exercises (OP) - 07/25/21 1255       ADLs   ADL Education Given Yes      Manual Therapy   Manual Therapy Edema management;Compression Bandaging    Manual Lymphatic Drainage (MLD) MLD to RLE utilizing short neck sequence, deep abdominal pathway, functional inguinal regional LN and proximal to distal J strokes covering thigh, knee and leg in sequence.    Compression Bandaging 3 layer gradiant compression wraps  from base of toes to tibial tuberosity using 10 and 12 " wide short stretch bandages over 0.4 cm thick, single layer of Rosidal fam over cotton stockinett.                    OT Education - 07/25/21 1255     Education Details Pt educated on the difference between a basic sequential pneumatic compression device ("pump") and an advanced device, and why an advanced pump is more appopriate to address her  lipo-lymphedema.    Person(s) Educated Patient;Caregiver(s)    Methods Explanation;Demonstration;Handout    Comprehension Verbalized understanding;Returned demonstration;Need further instruction                 OT Long Term Goals - 06/27/21 1359       OT LONG TERM GOAL #1   Title Patient's intake functional score on the FOTO tool is 12 out of 100 (higher number = greater function). Given this patient's comorbidities she will experience at least an increase in function of 6 points, or higher, to increase level of independence with basic ADLs and functional ambulation and mobility.    Baseline 12/100    Time 12    Period Weeks    Status On-going   9th visit 06/20/21 score 12/100. Functional score unchanged frm intake. No measurable progress towards this goal to date.   Target Date 08/04/21      OT LONG TERM GOAL #2   Title Pt will be able to apply multi-layer, short stretch compression wraps to one leg at a time using correct gradient techniques with maximum caregiver assistance in an effort  to return the affected  limb(s)   to premorbid size and shape, to limit pain and infection risk, and to improve functional mobility and ambulation  for ADLs.    Baseline dependent    Time 4    Period Days    Status Achieved   paid caregiver x 2 able to correctly apply wraps.     OT LONG TERM GOAL #3   Title Pt verbalize  4 primary lymphedema  precautions and prevention strategies using printed reference (modified independence) to reduce infection risk limit progression of lymphedema.    Baseline Max A    Time 4    Period Days    Status Achieved  OT LONG TERM GOAL #4   Title Pt will achieve at least 10% limb volume reduction bilaterally during Intensive Phase CDT to prevent re-accumulation of lymphatic congestion and progression of fibrosis, to limit infection risk, to improve functional ambulation and transfers, and to improve functional performance of ADLs.    Baseline dependent    Time 12    Period Weeks    Status Partially Met   RLE dec 9.84%. RLE GOAL MET   Target Date 08/04/21                   Plan - 07/25/21 1251     Clinical Impression Statement Provided RLE MLD, skin care and gradient compression wrap from base of toes to popliteal fossa as estabished. Pt tolerated manual therapy without pain and she continues to do well w/out foot wrapped. No increased distal swelling observed despite 12 hour car ride over the weekend. Pt propped legs on seat in van to limit swelling. Excellent solution!.  Custom compression garment has been ordered per manufacturer. Will Ffit ASAP. Cont as per POC.    OT Occupational Profile and History Comprehensive Assessment- Review of records and extensive additional review of physical, cognitive, psychosocial history related to current functional performance    Occupational performance deficits (Please refer to evaluation for details): ADL's;IADL's;Rest and Sleep;Work;Leisure;Social Participation    Body Structure / Function / Physical Skills ADL;Edema;Skin  integrity;Mobility;Pain;Decreased knowledge of precautions;Decreased knowledge of use of DME;IADL;Balance;Endurance;Strength;UE functional use;Obesity;Vision;Gait;ROM;Sensation    Rehab Potential Fair    Clinical Decision Making Several treatment options, min-mod task modification necessary    Comorbidities Affecting Occupational Performance: Presence of comorbidities impacting occupational performance    Comorbidities impacting occupational performance description: Suspected lipedema. Possible CVI. Comorbidities that contribute to systemic fluid retention and lymphatic overload  include CKD, CHF, HTN, OSA, morbid obesity, chronic joint inflammation    Modification or Assistance to Complete Evaluation  Min-Moderate modification of tasks or assist with assess necessary to complete eval    OT Frequency 2x / week    OT Duration 12 weeks   and PRN.   OT Treatment/Interventions Self-care/ADL training;Therapeutic exercise;Manual Therapy;Energy conservation;Manual lymph drainage;Therapeutic activities;DME and/or AE instruction;Compression bandaging;Other (comment);Patient/family education   Fit with custom daytime compression garments and HOS devices designed to limit lympphedema progression and facilitate increased lymphatic circulation  during hours of sleep   Plan Intensive and Self-Management Complete Decongestive Therapy (CDT): Treat one leg at a time only to limit excessive fluid return to the heart and to limit falls risk. Provide Manual lymphatic Drainage (MLD), skin care with low ph lotion/ oil to increase skin hydrarion and mobility, therapeutic lymphatic pumping ther ex and multilayer gradient compression wraps to reduce limb volume. Lastly, fit appropriate compression garments to retain clinic al gains.    Recommended Other Services If cardiology is in agreement, fit with advanced sequential pneumatic compression device, or Flexitouch to decongest lymphatic fluid build up in fatty tissues under the  skin.This device wil enable Pt to manage lipo-lymphedema at home over time with fluid decongestion and pain relief. OT to arrange trial with manufacturer's rep is in agreement.    Consulted and Agree with Plan of Care Patient             Patient will benefit from skilled therapeutic intervention in order to improve the following deficits and impairments:   Body Structure / Function / Physical Skills: ADL, Edema, Skin integrity, Mobility, Pain, Decreased knowledge of precautions, Decreased knowledge of use of DME, IADL, Balance,  Endurance, Strength, UE functional use, Obesity, Vision, Gait, ROM, Sensation       Visit Diagnosis: Lymphedema, not elsewhere classified    Problem List Patient Active Problem List   Diagnosis Date Noted   Chest pain 02/05/2021   SOB (shortness of breath) 02/05/2021   Leg swelling 02/05/2021   Nonrheumatic mitral valve regurgitation 02/05/2021   Morbid obesity ([place]) 02/05/2021   S/P revision of total knee, right 05/05/2020   S/P right rotator cuff repair 07/30/2018   S/P left rotator cuff repair 11/23/2016   Benign essential hypertension 02/05/2003    Andrey Spearman, MS, OTR/L, [facility] 07/25/21 12:58 PM  [place] MAIN [facility] SERVICES [address] [place], [place], [address] Phone: [phone]   Fax:  [phone]  Name: Kristine Rangel MRN: [id number] Date of Birth: 1955-12-03

## 2021-07-27 ENCOUNTER — Ambulatory Visit: Payer: Medicare HMO | Admitting: Occupational Therapy

## 2021-07-27 ENCOUNTER — Ambulatory Visit: Payer: Medicare HMO

## 2021-08-03 ENCOUNTER — Ambulatory Visit: Payer: Medicare HMO | Admitting: Occupational Therapy

## 2021-08-03 ENCOUNTER — Other Ambulatory Visit: Payer: Self-pay

## 2021-08-03 DIAGNOSIS — I890 Lymphedema, not elsewhere classified: Secondary | ICD-10-CM | POA: Diagnosis not present

## 2021-08-03 NOTE — Therapy (Signed)
[place] MAIN [facility] SERVICES [address] [place], [place], [address] Phone: [phone]   Fax:  [phone]  Occupational Therapy Treatment  Patient Details  Name: Kristine Rangel MRN: [id number] Date of Birth: Feb 22, 1956 Referring Provider (OT): Arvil Chaco, PA-C   Encounter Date: 08/03/2021   OT End of Session - 08/03/21 1121     Visit Number 16    Number of Visits 36    Date for OT Re-Evaluation 08/03/21    OT Start Time 1110    OT Stop Time 1203    OT Time Calculation (min) 53 min    Equipment Utilized During Treatment assistive donning devices: Friction gloves, Tyvek slipper sock    Activity Tolerance Patient tolerated treatment well;No increased pain    Behavior During Therapy WFL for tasks assessed/performed             Past Medical History:  Diagnosis Date   Anxiety    Arthritis    Asthma    Bladder leak    Cerebral aneurysm    history-has coils   CHF (congestive heart failure) (HCC)    Chronic kidney disease    COPD (chronic obstructive pulmonary disease) (HCC)    Depression    Diabetes mellitus without complication (HCC)    Dyspnea    GERD (gastroesophageal reflux disease)    Glaucoma    Headache    History of kidney stones    Hyperlipidemia    Hypertension    MI (myocardial infarction) ([place])    unsure when   Neuropathy    Rotator cuff injury    Seasonal allergies    Stroke [facility])    TIA's    Past Surgical History:  Procedure Laterality Date   BRAIN SURGERY Right 2010   anuerysm repair   CARPAL TUNNEL RELEASE Right 06/12/2017   Procedure: CARPAL TUNNEL RELEASE;  Surgeon: Thornton Park, MD;  Location: [facility] ORS;  Service: Orthopedics;  Laterality: Right;   CHOLECYSTECTOMY     JOINT REPLACEMENT Bilateral    TOTAL KNEE REPLACEMENT   SHOULDER ARTHROSCOPY WITH OPEN ROTATOR CUFF REPAIR Left 11/23/2016   Procedure: SHOULDER ARTHROSCOPY WITH OPEN ROTATOR CUFF REPAIR, ARTHROSCOPIC SUBACROMIAL  DECOMPRESSION, DISTAL CLAVICLE EXCISION;  Surgeon: Thornton Park, MD;  Location: [facility] ORS;  Service: Orthopedics;  Laterality: Left;   SHOULDER ARTHROSCOPY WITH OPEN ROTATOR CUFF REPAIR Right 07/30/2018   Procedure: SHOULDER ARTHROSCOPY WITH OPEN ROTATOR CUFF REPAIR,SUBACROMINAL DECOMPRESSION AND DISTAL CLAVICLE EXCISION;  Surgeon: Thornton Park, MD;  Location: [facility] ORS;  Service: Orthopedics;  Laterality: Right;   TOTAL KNEE REVISION Right 05/05/2020   Procedure: right total knee arthroplasty revision;  Surgeon: Lovell Sheehan, MD;  Location: [facility] ORS;  Service: Orthopedics;  Laterality: Right;    There were no vitals filed for this visit.   Subjective Assessment - 08/03/21 1122     Subjective  Kristine Rangel presents for OT Rx visit 16/24visits   to address bilateral lower extremity lipo-lymphedema. Pt reports 5/10 LE related leg and knee pain. Pt is un accompanied  by paid caregiver.  She presents with compression wraps in place on the RLE. Pt last seen 9/12. She has missed 2 appointments consecutively due to transprtation issues.\    Pertinent History OA knees and back,  OSA, HTN, morbid obesity (BMI 50.0-59.9) , hx anxiety and depression, hx cereberal aneurism,  glaucoma, hx MI, neuropathy, B rotator cuff repairs, Hx TIAs. FOLLOW LYMPHEDREMA PRECAUTIONS FOR diastolic dysfunction, asthma, CKD, dyspnea, CHF, COPD DM  Limitations difficulty walking, impaired standing balance, dyspnea, decreased dynamic balance, decreased LB strength, decreased A/PROM 2/2 body habitus and skin approximation,  increased supportive care needs and decreased quality of life, impaired body image    Repetition Increases Symptoms    Patient Stated Goals "Get these legs straightened out make it not hurt so bad so I can do more."    Pain Onset More than a month ago   RTK 2016. S/P revision 2021. Chronic pain started ~ 2 yrs before revision and persists today                         OT  Treatments/Exercises (OP) - 08/03/21 1203       ADLs   ADL Education Given Yes      Manual Therapy   Manual Therapy Edema management;Compression Bandaging    Edema Management Fitted custom RLE Jobst ELVAREX classic flat knit ccl 2 (35-45 mmHg) compression stocking and Jobst RELAX A-D , ccl 2 HOS device. Excellent fit .    Compression Bandaging Compression wraps switched to LLE today utilizing same gradient techniques after sucessfully fitting RLE daytime stocking and HOS device. 3 layer gradiant compression wraps  from base of toes to tibial tuberosity using 10 and 12 " wide short stretch bandages over 0.4 cm thick, single layer of Rosidal fam over cotton stockinett.                    OT Education - 08/03/21 1214     Education Details Pt and CG edu for donning and doffing custom daytime compression garments and HOS devices. Pt instructed in wear and care regimes.    Person(s) Educated Patient;Caregiver(s)    Methods Explanation;Demonstration;Handout    Comprehension Verbalized understanding;Returned demonstration;Need further instruction                 OT Long Term Goals - 08/03/21 1211       OT LONG TERM GOAL #1   Title Patient's intake functional score on the FOTO tool is 12 out of 100 (higher number = greater function). Given this patient's comorbidities she will experience at least an increase in function of 6 points, or higher, to increase level of independence with basic ADLs and functional ambulation and mobility.    Baseline 12/100    Time 12    Period Weeks    Status On-going   9th visit 06/20/21 score 12/100. Functional score unchanged frm intake. No measurable progress towards this goal to date.     OT LONG TERM GOAL #2   Title Pt will be able to apply multi-layer, short stretch compression wraps to one leg at a time using correct gradient techniques with maximum caregiver assistance in an effort  to return the affected  limb(s)  to premorbid size and  shape, to limit pain and infection risk, and to improve functional mobility and ambulation  for ADLs.    Baseline dependent    Time 4    Period Days    Status Achieved   paid caregiver x 2 able to correctly apply wraps.     OT LONG TERM GOAL #3   Title Pt verbalize  4 primary lymphedema  precautions and prevention strategies using printed reference (modified independence) to reduce infection risk limit progression of lymphedema.    Baseline Max A    Time 4    Period Days    Status Achieved      OT LONG  TERM GOAL #4   Title Pt will achieve at least 10% limb volume reduction bilaterally during Intensive Phase CDT to prevent re-accumulation of lymphatic congestion and progression of fibrosis, to limit infection risk, to improve functional ambulation and transfers, and to improve functional performance of ADLs.    Baseline dependent    Time 12    Period Weeks    Status Partially Met   RLE dec 9.84%. RLE GOAL MET     OT LONG TERM GOAL #5   Title Pt able to donn and doff custom daytime compression garments and HOS devices with Max CG assistance to prevent lymphatic reaccumulation, to limit fibrosis formation leading to progression.    Baseline Dependent    Time 12    Period Weeks    Status Achieved                   Plan - 08/03/21 1208     Clinical Impression Statement Fitted custom RLE Jobst ELVAREX classic flat knit ccl 2 (23-32 mmHg) compression stocking and RLE custom  Jobst RELAX A-D ccl 2 HOS device. Excellent fit  on both. Pt and CG trained to don and doff garment and debvice using friction gloves and Tyvek sock. After skilled teaching CG able to don garment w extra time and vc. Pt edu for hgarment wear instructions and launndry recommendations. Pt verbalized understanding of all. Applied LLE knee length gradient compression wraps. Vendor notified they can send along 2nd RLE knee high. Cont as per POC.    OT Occupational Profile and History Comprehensive Assessment- Review  of records and extensive additional review of physical, cognitive, psychosocial history related to current functional performance    Occupational performance deficits (Please refer to evaluation for details): ADL's;IADL's;Rest and Sleep;Work;Leisure;Social Participation    Body Structure / Function / Physical Skills ADL;Edema;Skin integrity;Mobility;Pain;Decreased knowledge of precautions;Decreased knowledge of use of DME;IADL;Balance;Endurance;Strength;UE functional use;Obesity;Vision;Gait;ROM;Sensation    Rehab Potential Fair    Clinical Decision Making Several treatment options, min-mod task modification necessary    Comorbidities Affecting Occupational Performance: Presence of comorbidities impacting occupational performance    Comorbidities impacting occupational performance description: Suspected lipedema. Possible CVI. Comorbidities that contribute to systemic fluid retention and lymphatic overload  include CKD, CHF, HTN, OSA, morbid obesity, chronic joint inflammation    Modification or Assistance to Complete Evaluation  Min-Moderate modification of tasks or assist with assess necessary to complete eval    OT Frequency 2x / week    OT Duration 12 weeks   and PRN.   OT Treatment/Interventions Self-care/ADL training;Therapeutic exercise;Manual Therapy;Energy conservation;Manual lymph drainage;Therapeutic activities;DME and/or AE instruction;Compression bandaging;Other (comment);Patient/family education   Fit with custom daytime compression garments and HOS devices designed to limit lympphedema progression and facilitate increased lymphatic circulation  during hours of sleep   Plan Intensive and Self-Management Complete Decongestive Therapy (CDT): Treat one leg at a time only to limit excessive fluid return to the heart and to limit falls risk. Provide Manual lymphatic Drainage (MLD), skin care with low ph lotion/ oil to increase skin hydrarion and mobility, therapeutic lymphatic pumping ther ex and  multilayer gradient compression wraps to reduce limb volume. Lastly, fit appropriate compression garments to retain clinic al gains.    Recommended Other Services If cardiology is in agreement, fit with advanced sequential pneumatic compression device, or Flexitouch to decongest lymphatic fluid build up in fatty tissues under the skin.This device wil enable Pt to manage lipo-lymphedema at home over time with fluid decongestion and pain relief. OT to  arrange trial with manufacturer's rep is in agreement.    Consulted and Agree with Plan of Care Patient             Patient will benefit from skilled therapeutic intervention in order to improve the following deficits and impairments:   Body Structure / Function / Physical Skills: ADL, Edema, Skin integrity, Mobility, Pain, Decreased knowledge of precautions, Decreased knowledge of use of DME, IADL, Balance, Endurance, Strength, UE functional use, Obesity, Vision, Gait, ROM, Sensation       Visit Diagnosis: Lymphedema, not elsewhere classified    Problem List Patient Active Problem List   Diagnosis Date Noted   Chest pain 02/05/2021   SOB (shortness of breath) 02/05/2021   Leg swelling 02/05/2021   Nonrheumatic mitral valve regurgitation 02/05/2021   Morbid obesity ([place]) 02/05/2021   S/P revision of total knee, right 05/05/2020   S/P right rotator cuff repair 07/30/2018   S/P left rotator cuff repair 11/23/2016   Benign essential hypertension 02/05/2003   Andrey Spearman, Kristine, OTR/L, CLT-LANA 08/03/21 12:16 PM    [place] [address] [place], [place], [address] Phone: [phone]   Fax:  [phone]  Name: Kristine Rangel MRN: [id number] Date of Birth: 11-Apr-1956

## 2021-08-08 ENCOUNTER — Ambulatory Visit: Payer: Medicare HMO | Admitting: Occupational Therapy

## 2021-08-08 ENCOUNTER — Encounter: Payer: Self-pay | Admitting: *Deleted

## 2021-08-09 ENCOUNTER — Ambulatory Visit
Admission: RE | Admit: 2021-08-09 | Discharge: 2021-08-09 | Disposition: A | Discharge status: Home / Self Care | Payer: Medicare HMO | Attending: Gastroenterology | Admitting: Gastroenterology

## 2021-08-09 ENCOUNTER — Encounter
Admission: RE | Disposition: A | Discharge status: Home / Self Care | Payer: Self-pay | Source: Home / Self Care | Attending: Gastroenterology

## 2021-08-09 ENCOUNTER — Encounter: Payer: Self-pay | Admitting: *Deleted

## 2021-08-09 ENCOUNTER — Other Ambulatory Visit: Payer: Self-pay

## 2021-08-09 ENCOUNTER — Ambulatory Visit: Payer: Medicare HMO | Admitting: Anesthesiology

## 2021-08-09 DIAGNOSIS — Z86010 Personal history of colonic polyps: Secondary | ICD-10-CM | POA: Insufficient documentation

## 2021-08-09 DIAGNOSIS — E669 Obesity, unspecified: Secondary | ICD-10-CM | POA: Diagnosis not present

## 2021-08-09 DIAGNOSIS — Z91030 Bee allergy status: Secondary | ICD-10-CM | POA: Diagnosis not present

## 2021-08-09 DIAGNOSIS — Z791 Long term (current) use of non-steroidal anti-inflammatories (NSAID): Secondary | ICD-10-CM | POA: Insufficient documentation

## 2021-08-09 DIAGNOSIS — J449 Chronic obstructive pulmonary disease, unspecified: Secondary | ICD-10-CM | POA: Diagnosis not present

## 2021-08-09 DIAGNOSIS — R1310 Dysphagia, unspecified: Secondary | ICD-10-CM | POA: Insufficient documentation

## 2021-08-09 DIAGNOSIS — Z79899 Other long term (current) drug therapy: Secondary | ICD-10-CM | POA: Insufficient documentation

## 2021-08-09 DIAGNOSIS — K449 Diaphragmatic hernia without obstruction or gangrene: Secondary | ICD-10-CM | POA: Diagnosis not present

## 2021-08-09 DIAGNOSIS — I130 Hypertensive heart and chronic kidney disease with heart failure and stage 1 through stage 4 chronic kidney disease, or unspecified chronic kidney disease: Secondary | ICD-10-CM | POA: Insufficient documentation

## 2021-08-09 DIAGNOSIS — Z7951 Long term (current) use of inhaled steroids: Secondary | ICD-10-CM | POA: Diagnosis not present

## 2021-08-09 DIAGNOSIS — K5730 Diverticulosis of large intestine without perforation or abscess without bleeding: Secondary | ICD-10-CM | POA: Diagnosis not present

## 2021-08-09 DIAGNOSIS — Z7982 Long term (current) use of aspirin: Secondary | ICD-10-CM | POA: Insufficient documentation

## 2021-08-09 DIAGNOSIS — K640 First degree hemorrhoids: Secondary | ICD-10-CM | POA: Diagnosis not present

## 2021-08-09 DIAGNOSIS — I509 Heart failure, unspecified: Secondary | ICD-10-CM | POA: Insufficient documentation

## 2021-08-09 DIAGNOSIS — E1122 Type 2 diabetes mellitus with diabetic chronic kidney disease: Secondary | ICD-10-CM | POA: Insufficient documentation

## 2021-08-09 DIAGNOSIS — I252 Old myocardial infarction: Secondary | ICD-10-CM | POA: Diagnosis not present

## 2021-08-09 DIAGNOSIS — K2100 Gastro-esophageal reflux disease with esophagitis, without bleeding: Secondary | ICD-10-CM | POA: Insufficient documentation

## 2021-08-09 DIAGNOSIS — Z880 Allergy status to penicillin: Secondary | ICD-10-CM | POA: Diagnosis not present

## 2021-08-09 HISTORY — PX: ESOPHAGOGASTRODUODENOSCOPY (EGD) WITH PROPOFOL: SHX5813

## 2021-08-09 HISTORY — PX: COLONOSCOPY WITH PROPOFOL: SHX5780

## 2021-08-09 SURGERY — ESOPHAGOGASTRODUODENOSCOPY (EGD) WITH PROPOFOL
Anesthesia: General

## 2021-08-09 MED ORDER — DEXMEDETOMIDINE HCL IN NACL 200 MCG/50ML IV SOLN
INTRAVENOUS | Status: DC | PRN
  Administered 2021-08-09 (×2): 10 ug via INTRAVENOUS

## 2021-08-09 MED ORDER — LIDOCAINE HCL (CARDIAC) PF 100 MG/5ML IV SOSY
PREFILLED_SYRINGE | INTRAVENOUS | Status: DC | PRN
  Administered 2021-08-09: 50 mg via INTRAVENOUS

## 2021-08-09 MED ORDER — DEXMEDETOMIDINE HCL IN NACL 200 MCG/50ML IV SOLN
INTRAVENOUS | Status: AC
  Filled 2021-08-09: qty 50

## 2021-08-09 MED ORDER — ONDANSETRON HCL 4 MG/2ML IJ SOLN
INTRAMUSCULAR | Status: DC | PRN
  Administered 2021-08-09: 4 mg via INTRAVENOUS

## 2021-08-09 MED ORDER — SODIUM CHLORIDE 0.9 % IV SOLN: INTRAVENOUS | Status: DC

## 2021-08-09 MED ORDER — PROPOFOL 500 MG/50ML IV EMUL
INTRAVENOUS | Status: DC | PRN
  Administered 2021-08-09: 100 ug/kg/min via INTRAVENOUS

## 2021-08-09 MED ORDER — PROPOFOL 500 MG/50ML IV EMUL
INTRAVENOUS | Status: AC
  Filled 2021-08-09: qty 50

## 2021-08-09 MED ORDER — PROPOFOL 10 MG/ML IV BOLUS
INTRAVENOUS | Status: DC | PRN
  Administered 2021-08-09: 60 mg via INTRAVENOUS
  Administered 2021-08-09: 30 mg via INTRAVENOUS

## 2021-08-09 NOTE — Transfer of Care (Signed)
Immediate Anesthesia Transfer of Care Note  Patient: Kristine Rangel  Procedure(s) Performed: ESOPHAGOGASTRODUODENOSCOPY (EGD) WITH PROPOFOL COLONOSCOPY WITH PROPOFOL  Patient Location: PACU  Anesthesia Type:MAC  Level of Consciousness: awake and drowsy  Airway & Oxygen Therapy: Patient Spontanous Breathing  Post-op Assessment: Report given to RN and Post -op Vital signs reviewed and stable  Post vital signs: stable  Last Vitals:  Vitals Value Taken Time  BP 95/45 08/09/21 1049  Temp    Pulse 85 08/09/21 1049  Resp 24 08/09/21 1049  SpO2 98 % 08/09/21 1049  Vitals shown include unvalidated device data.  Last Pain:  Vitals:   08/09/21 1009  TempSrc: Temporal  PainSc: 0-No pain         Complications: No notable events documented.

## 2021-08-09 NOTE — Anesthesia Preprocedure Evaluation (Addendum)
Anesthesia Evaluation  Patient identified by MRN, date of birth, ID band Patient awake    Reviewed: Allergy & Precautions, NPO status , Patient's Chart, lab work & pertinent test results  History of Anesthesia Complications Negative for: history of anesthetic complications  Airway Mallampati: II  TM Distance: >3 FB Neck ROM: Full    Dental  (+) Edentulous Upper, Missing,    Pulmonary shortness of breath, asthma , neg sleep apnea, COPD,  COPD inhaler, Not current smoker, former smoker,    Pulmonary exam normal        Cardiovascular Exercise Tolerance: Poor hypertension, Pt. on medications + Past MI, +CHF (Grade II DD) and + DOE  (-) dysrhythmias + Valvular Problems/Murmurs (Nonrheumatic mitral valve regurgitation) MR  Rhythm:Regular Rate:Normal + Peripheral Edema Peripheral Edema  ECHO 02/2021: 1. Left ventricular ejection fraction, by estimation, is 60 to 65%. The  left ventricle has normal function. The left ventricle has no regional  wall motion abnormalities. Left ventricular diastolic parameters are  consistent with Grade II diastolic  dysfunction (pseudonormalization).  2. Right ventricular systolic function is normal. The right ventricular  size is normal. There is normal pulmonary artery systolic pressure. The  estimated right ventricular systolic pressure is 32.6 mmHg.  3. Left atrial size was mildly dilated.   Neuro/Psych  Headaches, neg Seizures Anxiety Depression Cerebral aneurysm history of coils  Neuromuscular disease (Nueropathy) CVA (Pt unaware of CVAs, noted on scan)    GI/Hepatic Neg liver ROS, GERD  Medicated and Controlled,  Endo/Other  diabetes, Type 2, Oral Hypoglycemic AgentsMorbid obesity  Renal/GU Renal InsufficiencyRenal disease Bladder dysfunction      Musculoskeletal  (+) Arthritis ,   Abdominal (+) + obese,   Peds  Hematology   Anesthesia Other Findings    Reproductive/Obstetrics                            Anesthesia Physical  Anesthesia Plan  ASA: III  Anesthesia Plan: General   Post-op Pain Management:    Induction:   PONV Risk Score and Plan: Treatment may vary due to age or medical condition and Propofol infusion  Airway Management Planned: Nasal Cannula and Natural Airway  Additional Equipment:   Intra-op Plan:   Post-operative Plan:   Informed Consent: I have reviewed the patients History and Physical, chart, labs and discussed the procedure including the risks, benefits and alternatives for the proposed anesthesia with the patient or authorized representative who has indicated his/her understanding and acceptance.     Dental advisory given  Plan Discussed with: CRNA and Anesthesiologist  Anesthesia Plan Comments:        Anesthesia Quick Evaluation

## 2021-08-09 NOTE — Interval H&P Note (Signed)
History and Physical Interval Note:  08/09/2021 9:56 AM  Kristine Rangel  has presented today for surgery, with the diagnosis of History of Colon Polyps and GERD.  The various methods of treatment have been discussed with the patient and family. After consideration of risks, benefits and other options for treatment, the patient has consented to  Procedure(s): ESOPHAGOGASTRODUODENOSCOPY (EGD) WITH PROPOFOL (N/A) COLONOSCOPY WITH PROPOFOL (N/A) as a surgical intervention.  The patient's history has been reviewed, patient examined, no change in status, stable for surgery.  I have reviewed the patient's chart and labs.  Questions were answered to the patient's satisfaction.     Regis Bill  Ok to proceed with EGD/Colonoscopy

## 2021-08-09 NOTE — Op Note (Signed)
[facility] Patient Name: Kristine Rangel Procedure Date: 08/09/2021 10:14 AM MRN: [id number] Account #: [id number] Date of Birth: 1955/12/02 Admit Type: Outpatient Age: 65 Room: [facility] ENDO ROOM 1 Gender: Female Note Status: Finalized Instrument Name: Park Meo [id number] Procedure:             Colonoscopy Indications:           Surveillance: Personal history of adenomatous polyps                         on last colonoscopy 5 years ago Providers:             Andrey Farmer MD, MD Referring MD:          Marguerita Merles, MD (Referring MD) Medicines:             Monitored Anesthesia Care Complications:         No immediate complications. Procedure:             Pre-Anesthesia Assessment:                        - Prior to the procedure, a History and Physical was                         performed, and patient medications and allergies were                         reviewed. The patient is competent. The risks and                         benefits of the procedure and the sedation options and                         risks were discussed with the patient. All questions                         were answered and informed consent was obtained.                         Patient identification and proposed procedure were                         verified by the physician, the nurse, the anesthetist                         and the technician in the endoscopy suite. Mental                         Status Examination: alert and oriented. Airway                         Examination: normal oropharyngeal airway and neck                         mobility. Respiratory Examination: clear to                         auscultation. CV Examination: normal. Prophylactic  Antibiotics: The patient does not require prophylactic                         antibiotics. Prior Anticoagulants: The patient has                         taken no previous anticoagulant or  antiplatelet                         agents. ASA Grade Assessment: III - A patient with                         severe systemic disease. After reviewing the risks and                         benefits, the patient was deemed in satisfactory                         condition to undergo the procedure. The anesthesia                         plan was to use monitored anesthesia care (MAC).                         Immediately prior to administration of medications,                         the patient was re-assessed for adequacy to receive                         sedatives. The heart rate, respiratory rate, oxygen                         saturations, blood pressure, adequacy of pulmonary                         ventilation, and response to care were monitored                         throughout the procedure. The physical status of the                         patient was re-assessed after the procedure.                        After obtaining informed consent, the colonoscope was                         passed under direct vision. Throughout the procedure,                         the patient's blood pressure, pulse, and oxygen                         saturations were monitored continuously. The                         Colonoscope was introduced through the anus and  advanced to the the terminal ileum. The colonoscopy                         was performed without difficulty. The patient                         tolerated the procedure well. The quality of the bowel                         preparation was excellent. Findings:      The perianal and digital rectal examinations were normal.      The terminal ileum appeared normal.      A few small-mouthed diverticula were found in the sigmoid colon.      Internal hemorrhoids were found during retroflexion. The hemorrhoids       were Grade I (internal hemorrhoids that do not prolapse).      The exam was otherwise without  abnormality on direct and retroflexion       views. Impression:            - The examined portion of the ileum was normal.                        - Diverticulosis in the sigmoid colon.                        - Internal hemorrhoids.                        - The examination was otherwise normal on direct and                         retroflexion views.                        - No specimens collected. Recommendation:        - Discharge patient to home.                        - Resume previous diet.                        - Continue present medications.                        - Repeat colonoscopy in 10 years for surveillance.                        - Return to referring physician as previously                         scheduled. Procedure Code(s):     --- Professional ---                        T6256, Colorectal cancer screening; colonoscopy on                         individual at high risk Diagnosis Code(s):     --- Professional ---  Z86.010, Personal history of colonic polyps                        K64.0, First degree hemorrhoids                        K57.30, Diverticulosis of large intestine without                         perforation or abscess without bleeding CPT copyright 2019 [employer]. All rights reserved. The codes documented in this report are preliminary and upon coder review may  be revised to meet current compliance requirements. Andrey Farmer MD, MD 08/09/2021 10:49:40 AM Number of Addenda: 0 Note Initiated On: 08/09/2021 10:14 AM Scope Withdrawal Time: 0 hours 7 minutes 58 seconds  Total Procedure Duration: 0 hours 13 minutes 38 seconds  Estimated Blood Loss:  Estimated blood loss: none.      [facility]

## 2021-08-09 NOTE — Op Note (Signed)
[facility] Patient Name: Kristine Rangel Procedure Date: 08/09/2021 10:15 AM MRN: [id number] Account #: [id number] Date of Birth: Dec 09, 1955 Admit Type: Outpatient Age: 65 Room: [facility] ENDO ROOM 1 Gender: Female Note Status: Finalized Instrument Name: Upper Endoscope [id number] Procedure:             Upper GI endoscopy Indications:           Dysphagia, Gastro-esophageal reflux disease Providers:             Andrey Farmer MD, MD Referring MD:          Marguerita Merles, MD (Referring MD) Medicines:             Monitored Anesthesia Care Complications:         No immediate complications. Estimated blood loss:                         Minimal. Procedure:             Pre-Anesthesia Assessment:                        - Prior to the procedure, a History and Physical was                         performed, and patient medications and allergies were                         reviewed. The patient is competent. The risks and                         benefits of the procedure and the sedation options and                         risks were discussed with the patient. All questions                         were answered and informed consent was obtained.                         Patient identification and proposed procedure were                         verified by the physician, the nurse, the anesthetist                         and the technician in the endoscopy suite. Mental                         Status Examination: alert and oriented. Airway                         Examination: normal oropharyngeal airway and neck                         mobility. Respiratory Examination: clear to                         auscultation. CV Examination: normal. Prophylactic  Antibiotics: The patient does not require prophylactic                         antibiotics. Prior Anticoagulants: The patient has                         taken no previous anticoagulant or  antiplatelet                         agents. ASA Grade Assessment: III - A patient with                         severe systemic disease. After reviewing the risks and                         benefits, the patient was deemed in satisfactory                         condition to undergo the procedure. The anesthesia                         plan was to use monitored anesthesia care (MAC).                         Immediately prior to administration of medications,                         the patient was re-assessed for adequacy to receive                         sedatives. The heart rate, respiratory rate, oxygen                         saturations, blood pressure, adequacy of pulmonary                         ventilation, and response to care were monitored                         throughout the procedure. The physical status of the                         patient was re-assessed after the procedure.                        After obtaining informed consent, the endoscope was                         passed under direct vision. Throughout the procedure,                         the patient's blood pressure, pulse, and oxygen                         saturations were monitored continuously. The Endoscope                         was introduced through the mouth, and advanced to the  second part of duodenum. The upper GI endoscopy was                         accomplished without difficulty. The patient tolerated                         the procedure well. Findings:      A small hiatal hernia was present.      No endoscopic abnormality was evident in the esophagus to explain the       patient's complaint of dysphagia. Biopsies were obtained from the       proximal and distal esophagus with cold forceps for histology of       suspected eosinophilic esophagitis. Estimated blood loss was minimal.      The entire examined stomach was normal.      The examined duodenum was  normal. Impression:            - Small hiatal hernia.                        - No endoscopic esophageal abnormality to explain                         patient's dysphagia. Biopsied.                        - Normal stomach.                        - Normal examined duodenum. Recommendation:        - Perform a colonoscopy today. Procedure Code(s):     --- Professional ---                        [phone], Esophagogastroduodenoscopy, flexible,                         transoral; with biopsy, single or multiple Diagnosis Code(s):     --- Professional ---                        K44.9, Diaphragmatic hernia without obstruction or                         gangrene                        R13.10, Dysphagia, unspecified                        K21.9, Gastro-esophageal reflux disease without                         esophagitis CPT copyright 2019 [employer]. All rights reserved. The codes documented in this report are preliminary and upon coder review may  be revised to meet current compliance requirements. Andrey Farmer MD, MD 08/09/2021 10:47:14 AM Number of Addenda: 0 Note Initiated On: 08/09/2021 10:15 AM Estimated Blood Loss:  Estimated blood loss was minimal.      [facility]

## 2021-08-09 NOTE — H&P (Signed)
Outpatient short stay form Pre-procedure 08/09/2021  Regis Bill, MD  Primary Physician: Leanna Sato, MD  Reason for visit:  GERD/History of polyps  History of present illness:   65 y/o lady with history of obesity, DM II, and lymphedema here for EGD for GERD and colonoscopy for history of small TA 5 years ago. Grandmother with rectal cancer. No blood thinners. History of cholecystectomy.   No current facility-administered medications for this encounter.  Medications Prior to Admission  Medication Sig Dispense Refill Last Dose   ALPRAZolam (XANAX) 0.5 MG tablet Take 0.5 mg by mouth 2 (two) times daily as needed for anxiety.      brinzolamide (AZOPT) 1 % ophthalmic suspension Place 1 drop into both eyes 3 (three) times daily.      diclofenac (VOLTAREN) 75 MG EC tablet Take 75 mg by mouth 2 (two) times daily.      dicyclomine (BENTYL) 10 MG capsule Take 10 mg by mouth 4 (four) times daily -  before meals and at bedtime.      empagliflozin (JARDIANCE) 25 MG TABS tablet Take 25 mg by mouth daily.      POTASSIUM CHLORIDE PO Take 15 mEq by mouth daily.      solifenacin (VESICARE) 10 MG tablet Take 10 mg by mouth daily.      topiramate (TOPAMAX) 100 MG tablet Take 100 mg by mouth 2 (two) times daily.      aspirin EC 81 MG tablet Take 81 mg by mouth daily. Swallow whole.      atorvastatin (LIPITOR) 40 MG tablet Take 40 mg by mouth at bedtime.      brimonidine-timolol (COMBIGAN) 0.2-0.5 % ophthalmic solution Place 1 drop into both eyes 2 (two) times daily.      carvedilol (COREG) 3.125 MG tablet Take 1 tablet (3.125 mg total) by mouth 2 (two) times daily with a meal. 60 tablet 5    cetirizine (ZYRTEC) 10 MG tablet Take 10 mg by mouth daily.      DULoxetine (CYMBALTA) 60 MG capsule Take 60 mg by mouth at bedtime.       EPINEPHrine 0.3 mg/0.3 mL IJ SOAJ injection Inject 0.3 mg into the skin as needed for anaphylaxis.       esomeprazole (NEXIUM) 40 MG capsule Take 40 mg by mouth daily.       fluticasone (FLONASE) 50 MCG/ACT nasal spray Place 1 spray into both nostrils daily.       furosemide (LASIX) 40 MG tablet Take 40 mg by mouth 2 (two) times daily.       gabapentin (NEURONTIN) 300 MG capsule Take 300 mg by mouth 3 (three) times daily.      isosorbide mononitrate (IMDUR) 60 MG 24 hr tablet Take 1 tablet (60 mg total) by mouth daily. 30 tablet 5    latanoprost (XALATAN) 0.005 % ophthalmic solution Place 1 drop into both eyes at bedtime.      meloxicam (MOBIC) 7.5 MG tablet Take 7.5 mg by mouth 2 (two) times daily.      MYRBETRIQ 25 MG TB24 tablet TAKE 1 TABLET BY MOUTH EVERY DAY for bladder      nitroGLYCERIN (NITROSTAT) 0.4 MG SL tablet Place 1 tablet (0.4 mg total) under the tongue every 5 (five) minutes as needed for chest pain. 25 tablet 3    SYMBICORT 160-4.5 MCG/ACT inhaler Inhale 2 puffs into the lungs 2 (two) times daily.      tiZANidine (ZANAFLEX) 4 MG tablet Take 2-4 mg by  mouth every 6 (six) hours as needed for muscle spasms.       traMADol (ULTRAM) 50 MG tablet Take 50 mg by mouth every 6 (six) hours as needed.      valsartan (DIOVAN) 40 MG tablet Take 1 tablet (40 mg total) by mouth daily. 30 tablet 3    zolpidem (AMBIEN) 10 MG tablet Take 10 mg by mouth at bedtime as needed.        Allergies  Allergen Reactions   Bee Venom Anaphylaxis   Penicillins Hives, Swelling and Other (See Comments)    Has patient had a PCN reaction causing immediate rash, facial/tongue/throat swelling, SOB or lightheadedness with hypotension: Yes Has patient had a PCN reaction causing severe rash involving mucus membranes or skin necrosis: No Has patient had a PCN reaction that required hospitalization No Has patient had a PCN reaction occurring within the last 10 years: No If all of the above answers are "NO", then may proceed with Cephalosporin use.     Past Medical History:  Diagnosis Date   Anxiety    Arthritis    Asthma    Bladder leak    Cerebral aneurysm    history-has  coils   CHF (congestive heart failure) (HCC)    Chronic kidney disease    COPD (chronic obstructive pulmonary disease) (HCC)    Depression    Diabetes mellitus without complication (HCC)    Dyspnea    GERD (gastroesophageal reflux disease)    Glaucoma    Headache    History of kidney stones    Hyperlipidemia    Hypertension    MI (myocardial infarction) (HCC)    unsure when   Neuropathy    Rotator cuff injury    Seasonal allergies    Stroke (HCC)    TIA's    Review of systems:  Otherwise negative.    Physical Exam  Gen: Alert, oriented. Appears stated age.  HEENT: PERRLA. Lungs: No respiratory distress CV: RRR Abd: soft, benign, no masses Ext: No edema    Planned procedures: Proceed with EGD/colonoscopy. The patient understands the nature of the planned procedure, indications, risks, alternatives and potential complications including but not limited to bleeding, infection, perforation, damage to internal organs and possible oversedation/side effects from anesthesia. The patient agrees and gives consent to proceed.  Please refer to procedure notes for findings, recommendations and patient disposition/instructions.     Regis Bill, MD [facility]

## 2021-08-10 ENCOUNTER — Ambulatory Visit: Payer: Medicare HMO | Admitting: Occupational Therapy

## 2021-08-10 ENCOUNTER — Ambulatory Visit: Payer: Medicare HMO

## 2021-08-10 ENCOUNTER — Encounter: Payer: Self-pay | Admitting: Gastroenterology

## 2021-08-10 LAB — SURGICAL PATHOLOGY

## 2021-08-10 NOTE — Anesthesia Postprocedure Evaluation (Signed)
Anesthesia Post Note  Patient: Kristine Rangel  Procedure(s) Performed: ESOPHAGOGASTRODUODENOSCOPY (EGD) WITH PROPOFOL COLONOSCOPY WITH PROPOFOL  Patient location during evaluation: Endoscopy Anesthesia Type: General Level of consciousness: awake and alert Pain management: pain level controlled Vital Signs Assessment: post-procedure vital signs reviewed and stable Respiratory status: spontaneous breathing, nonlabored ventilation and respiratory function stable Cardiovascular status: blood pressure returned to baseline and stable Postop Assessment: no apparent nausea or vomiting Anesthetic complications: no   No notable events documented.   Last Vitals:  Vitals:   08/09/21 1109 08/09/21 1119  BP: (!) 107/59   Pulse: 84 76  Resp: 20 (!) 22  Temp:    SpO2: 100% 100%    Last Pain:  Vitals:   08/09/21 1119  TempSrc:   PainSc: 0-No pain                 Foye Deer

## 2021-08-15 ENCOUNTER — Telehealth: Payer: Self-pay | Admitting: Physician Assistant

## 2021-08-15 NOTE — Telephone Encounter (Signed)
Patient is scheduled to see Marisue Ivan, PA on 08/17/21. To Jacquelyn to ok if visit may be switched to virtual.

## 2021-08-15 NOTE — Telephone Encounter (Signed)
Alicyn, Klann A - 08/15/2021  3:47 PM Marisue Ivan D, PA-C [phone])  Sent: Sheral Flow August 15, 2021  4:02 PM  To: Jefferey Pica, RN          Message  If she keeps an accurate HR, BP log at least 2 hours after taking her medications, then this should be acceptable.

## 2021-08-15 NOTE — Telephone Encounter (Signed)
Spoke with pt and she is agreeable to monitor her BP and HR using a family cuff until her cuff arrives from [place].   I changed her appointment from an in person visit to a telephone visit.

## 2021-08-15 NOTE — Telephone Encounter (Signed)
New message:    Patient calling to see if she can get VV instead of office visit due to transportation.

## 2021-08-16 ENCOUNTER — Encounter: Payer: Self-pay | Admitting: Physical Therapy

## 2021-08-16 ENCOUNTER — Telehealth: Payer: Self-pay | Admitting: Internal Medicine

## 2021-08-16 ENCOUNTER — Other Ambulatory Visit: Payer: Self-pay

## 2021-08-16 ENCOUNTER — Ambulatory Visit: Payer: Medicare HMO | Attending: Physician Assistant | Admitting: Occupational Therapy

## 2021-08-16 ENCOUNTER — Ambulatory Visit: Payer: Medicare HMO | Admitting: Physical Therapy

## 2021-08-16 DIAGNOSIS — R262 Difficulty in walking, not elsewhere classified: Secondary | ICD-10-CM | POA: Insufficient documentation

## 2021-08-16 DIAGNOSIS — M6281 Muscle weakness (generalized): Secondary | ICD-10-CM | POA: Diagnosis present

## 2021-08-16 DIAGNOSIS — R2681 Unsteadiness on feet: Secondary | ICD-10-CM | POA: Diagnosis present

## 2021-08-16 DIAGNOSIS — R269 Unspecified abnormalities of gait and mobility: Secondary | ICD-10-CM

## 2021-08-16 DIAGNOSIS — I890 Lymphedema, not elsewhere classified: Secondary | ICD-10-CM | POA: Diagnosis not present

## 2021-08-16 NOTE — Therapy (Signed)
[place] MAIN [facility] SERVICES [address] [place], [place], [address] Phone: [phone]   Fax:  [phone]  Occupational Therapy Treatment  Patient Details  Name: Kristine Rangel MRN: [id number] Date of Birth: 1956/08/02 Referring Provider (OT): Arvil Chaco, PA-C   Encounter Date: 08/16/2021   OT End of Session - 08/16/21 1012     Visit Number 17    Number of Visits 36    Date for OT Re-Evaluation 11/14/21    OT Start Time 1005    OT Stop Time 1103    OT Time Calculation (min) 58 min    Equipment Utilized During Treatment assistive donning devices: Friction gloves, Tyvek slipper sock    Activity Tolerance Patient tolerated treatment well;No increased pain    Behavior During Therapy WFL for tasks assessed/performed             Past Medical History:  Diagnosis Date   Anxiety    Arthritis    Asthma    Bladder leak    Cerebral aneurysm    history-has coils   CHF (congestive heart failure) (HCC)    Chronic kidney disease    COPD (chronic obstructive pulmonary disease) (HCC)    Depression    Diabetes mellitus without complication (HCC)    Dyspnea    GERD (gastroesophageal reflux disease)    Glaucoma    Headache    History of kidney stones    Hyperlipidemia    Hypertension    MI (myocardial infarction) ([employer])    unsure when   Neuropathy    Rotator cuff injury    Seasonal allergies    Stroke [facility])    TIA's    Past Surgical History:  Procedure Laterality Date   BRAIN SURGERY Right 2010   anuerysm repair   CARPAL TUNNEL RELEASE Right 06/12/2017   Procedure: CARPAL TUNNEL RELEASE;  Surgeon: Thornton Park, MD;  Location: [facility] ORS;  Service: Orthopedics;  Laterality: Right;   CHOLECYSTECTOMY     COLONOSCOPY WITH PROPOFOL N/A 08/09/2021   Procedure: COLONOSCOPY WITH PROPOFOL;  Surgeon: Lesly Rubenstein, MD;  Location: [facility] END[facility];  Service: Endoscopy;  Laterality: N/A;   ESOPHAGOGASTRODUODENOSCOPY (EGD) WITH  PROPOFOL N/A 08/09/2021   Procedure: ESOPHAGOGASTRODUODENOSCOPY (EGD) WITH PROPOFOL;  Surgeon: Lesly Rubenstein, MD;  Location: [facility] ENDOSCOPY;  Service: Endoscopy;  Laterality: N/A;   JOINT REPLACEMENT Bilateral    TOTAL KNEE REPLACEMENT   SHOULDER ARTHROSCOPY WITH OPEN ROTATOR CUFF REPAIR Left 11/23/2016   Procedure: SHOULDER ARTHROSCOPY WITH OPEN ROTATOR CUFF REPAIR, ARTHROSCOPIC SUBACROMIAL DECOMPRESSION, DISTAL CLAVICLE EXCISION;  Surgeon: Thornton Park, MD;  Location: [facility] ORS;  Service: Orthopedics;  Laterality: Left;   SHOULDER ARTHROSCOPY WITH OPEN ROTATOR CUFF REPAIR Right 07/30/2018   Procedure: SHOULDER ARTHROSCOPY WITH OPEN ROTATOR CUFF REPAIR,SUBACROMINAL DECOMPRESSION AND DISTAL CLAVICLE EXCISION;  Surgeon: Thornton Park, MD;  Location: [facility] ORS;  Service: Orthopedics;  Laterality: Right;   TOTAL KNEE REVISION Right 05/05/2020   Procedure: right total knee arthroplasty revision;  Surgeon: Lovell Sheehan, MD;  Location: [facility] ORS;  Service: Orthopedics;  Laterality: Right;    There were no vitals filed for this visit.   Subjective Assessment - 08/16/21 1014     Subjective  [name] presents for OT Rx visit 17/24visits   to address bilateral lower extremity lipo-lymphedema. Pt endorses B knee joint pain, but denies LE related pain this morning. Pt is un accompanied  by paid caregiver.  She presents with compression wraps in place on the  LLE and custom compression garment fitted last session on the RLE. Pt denies new complaints, but expresses frustration over lack of transportation to medical appointments at present.    Pertinent History OA knees and back,  OSA, HTN, morbid obesity (BMI 50.0-59.9) , hx anxiety and depression, hx cereberal aneurism,  glaucoma, hx MI, neuropathy, B rotator cuff repairs, Hx TIAs. FOLLOW LYMPHEDREMA PRECAUTIONS FOR diastolic dysfunction, asthma, CKD, dyspnea, CHF, COPD DM    Limitations difficulty walking, impaired standing balance, dyspnea, decreased  dynamic balance, decreased LB strength, decreased A/PROM 2/2 body habitus and skin approximation,  increased supportive care needs and decreased quality of life, impaired body image    Repetition Increases Symptoms    Patient Stated Goals "Get these legs straightened out make it not hurt so bad so I can do more."    Pain Onset More than a month ago   RTK 2016. S/P revision 2021. Chronic pain started ~ 2 yrs before revision and persists today                         OT Treatments/Exercises (OP) - 08/16/21 1405       ADLs   ADL Education Given Yes      Manual Therapy   Manual Therapy Edema management;Manual Lymphatic Drainage (MLD);Compression Bandaging    Manual therapy comments skin care to LLE wit MLD    Manual Lymphatic Drainage (MLD) to RLE/RLQ as established    Compression Bandaging Compression wraps switched to LLE today utilizing same gradient techniques after sucessfully fitting RLE daytime stocking and HOS device. 3 layer gradiant compression wraps  from base of toes to tibial tuberosity using 10 and 12 " wide short stretch bandages over 0.4 cm thick, single layer of Rosidal fam over cotton stockinett.                    OT Education - 08/16/21 1406     Education Details Continued Pt/ CG edu for lymphedema self care  and home program throughout session. Topics include multilayer, gradient compression wrapping, simple self-MLD, therapeutic lymphatic pumping exercises, skin/nail care, risk reduction factors and LE precautions, compression garments/recommendations and wear and care schedule and compression garment donning / doffing using assistive devices. All questions answered to the Pt's satisfaction, and Pt demonstrates understanding by report.    Person(s) Educated Patient;Caregiver(s)    Methods Explanation;Demonstration;Handout    Comprehension Verbalized understanding;Returned demonstration;Need further instruction                 OT Long  Term Goals - 08/03/21 1211       OT LONG TERM GOAL #1   Title Patient's intake functional score on the FOTO tool is 12 out of 100 (higher number = greater function). Given this patient's comorbidities she will experience at least an increase in function of 6 points, or higher, to increase level of independence with basic ADLs and functional ambulation and mobility.    Baseline 12/100    Time 12    Period Weeks    Status On-going   9th visit 06/20/21 score 12/100. Functional score unchanged frm intake. No measurable progress towards this goal to date.     OT LONG TERM GOAL #2   Title Pt will be able to apply multi-layer, short stretch compression wraps to one leg at a time using correct gradient techniques with maximum caregiver assistance in an effort  to return the affected  limb(s)  to premorbid size  and shape, to limit pain and infection risk, and to improve functional mobility and ambulation  for ADLs.    Baseline dependent    Time 4    Period Days    Status Achieved   paid caregiver x 2 able to correctly apply wraps.     OT LONG TERM GOAL #3   Title Pt verbalize  4 primary lymphedema  precautions and prevention strategies using printed reference (modified independence) to reduce infection risk limit progression of lymphedema.    Baseline Max A    Time 4    Period Days    Status Achieved      OT LONG TERM GOAL #4   Title Pt will achieve at least 10% limb volume reduction bilaterally during Intensive Phase CDT to prevent re-accumulation of lymphatic congestion and progression of fibrosis, to limit infection risk, to improve functional ambulation and transfers, and to improve functional performance of ADLs.    Baseline dependent    Time 12    Period Weeks    Status Partially Met   RLE dec 9.84%. RLE GOAL MET     OT LONG TERM GOAL #5   Title Pt able to donn and doff custom daytime compression garments and HOS devices with Max CG assistance to prevent lymphatic reaccumulation, to limit  fibrosis formation leading to progression.    Baseline Dependent    Time 12    Period Weeks    Status Achieved                   Plan - 08/16/21 1359     Clinical Impression Statement Pt's LLE swelling below the knee is well managed at home between sessions with  assistance from pd caregivers. Pt is also performing diligent LE self care to the best ofg her abilities. Both her daughter and  CG assist w wraps. Pt enjoying RLE custom garment. No complaints and garment containing swelling as expected- very well. Provided LLE MLD, simultaneous skin care and reapplied multi layer compression wraps without increased pain. Pt agrees with plan to complete anatomical measurements for LLE next visit.  Cont as per POC.    OT Occupational Profile and History Comprehensive Assessment- Review of records and extensive additional review of physical, cognitive, psychosocial history related to current functional performance    Occupational performance deficits (Please refer to evaluation for details): ADL's;IADL's;Rest and Sleep;Work;Leisure;Social Participation    Body Structure / Function / Physical Skills ADL;Edema;Skin integrity;Mobility;Pain;Decreased knowledge of precautions;Decreased knowledge of use of DME;IADL;Balance;Endurance;Strength;UE functional use;Obesity;Vision;Gait;ROM;Sensation    Rehab Potential Fair    Clinical Decision Making Several treatment options, min-mod task modification necessary    Comorbidities Affecting Occupational Performance: Presence of comorbidities impacting occupational performance    Comorbidities impacting occupational performance description: Suspected lipedema. Possible CVI. Comorbidities that contribute to systemic fluid retention and lymphatic overload  include CKD, CHF, HTN, OSA, morbid obesity, chronic joint inflammation    Modification or Assistance to Complete Evaluation  Min-Moderate modification of tasks or assist with assess necessary to complete eval     OT Frequency 2x / week    OT Duration 12 weeks   and PRN.   OT Treatment/Interventions Self-care/ADL training;Therapeutic exercise;Manual Therapy;Energy conservation;Manual lymph drainage;Therapeutic activities;DME and/or AE instruction;Compression bandaging;Other (comment);Patient/family education   Fit with custom daytime compression garments and HOS devices designed to limit lympphedema progression and facilitate increased lymphatic circulation  during hours of sleep   Plan Intensive and Self-Management Complete Decongestive Therapy (CDT): Treat one leg at a time  only to limit excessive fluid return to the heart and to limit falls risk. Provide Manual lymphatic Drainage (MLD), skin care with low ph lotion/ oil to increase skin hydrarion and mobility, therapeutic lymphatic pumping ther ex and multilayer gradient compression wraps to reduce limb volume. Lastly, fit appropriate compression garments to retain clinic al gains.    Recommended Other Services If cardiology is in agreement, fit with advanced sequential pneumatic compression device, or Flexitouch to decongest lymphatic fluid build up in fatty tissues under the skin.This device wil enable Pt to manage lipo-lymphedema at home over time with fluid decongestion and pain relief. OT to arrange trial with manufacturer's rep is in agreement.    Consulted and Agree with Plan of Care Patient             Patient will benefit from skilled therapeutic intervention in order to improve the following deficits and impairments:   Body Structure / Function / Physical Skills: ADL, Edema, Skin integrity, Mobility, Pain, Decreased knowledge of precautions, Decreased knowledge of use of DME, IADL, Balance, Endurance, Strength, UE functional use, Obesity, Vision, Gait, ROM, Sensation       Visit Diagnosis: Lymphedema, not elsewhere classified    Problem List Patient Active Problem List   Diagnosis Date Noted   Chest pain 02/05/2021   SOB (shortness of  breath) 02/05/2021   Leg swelling 02/05/2021   Nonrheumatic mitral valve regurgitation 02/05/2021   Morbid obesity ([place]) 02/05/2021   S/P revision of total knee, right 05/05/2020   S/P right rotator cuff repair 07/30/2018   S/P left rotator cuff repair 11/23/2016   Benign essential hypertension 02/05/2003    Andrey Spearman, MS, OTR/L, [facility] 08/16/21 2:07 PM   [place] MAIN [facility] SERVICES [address] [place], [place], [address] Phone: [phone]   Fax:  [phone]  Name: JALEA BRONAUGH MRN: [id number] Date of Birth: 04-14-1956

## 2021-08-16 NOTE — Therapy (Signed)
[facility] MAIN [facility] SERVICES [address] [place], [place], [address] Phone: [phone]   Fax:  [phone]  Physical Therapy Treatment  Patient Details  Name: Kristine Rangel MRN: [id number] Date of Birth: 22-Sep-1956 Referring Provider (PT): Dr. Cassell Smiles   Encounter Date: 08/16/2021   PT End of Session - 08/16/21 1108     Visit Number 3    Number of Visits 25    Date for PT Re-Evaluation 10/05/21    Authorization Time Period Initial Cert period= 07/13/2021 through 10/05/2021    PT Start Time 1103    PT Stop Time 1145    PT Time Calculation (min) 42 min    Equipment Utilized During Treatment Gait belt    Activity Tolerance Patient tolerated treatment well;No increased pain;Patient limited by pain    Behavior During Therapy [facility] for tasks assessed/performed             Past Medical History:  Diagnosis Date   Anxiety    Arthritis    Asthma    Bladder leak    Cerebral aneurysm    history-has coils   CHF (congestive heart failure) (HCC)    Chronic kidney disease    COPD (chronic obstructive pulmonary disease) (HCC)    Depression    Diabetes mellitus without complication (HCC)    Dyspnea    GERD (gastroesophageal reflux disease)    Glaucoma    Headache    History of kidney stones    Hyperlipidemia    Hypertension    MI (myocardial infarction) (HCC)    unsure when   Neuropathy    Rotator cuff injury    Seasonal allergies    Stroke [facility])    TIA's    Past Surgical History:  Procedure Laterality Date   BRAIN SURGERY Right 2010   anuerysm repair   CARPAL TUNNEL RELEASE Right 06/12/2017   Procedure: CARPAL TUNNEL RELEASE;  Surgeon: Juanell Fairly, MD;  Location: [facility] ORS;  Service: Orthopedics;  Laterality: Right;   CHOLECYSTECTOMY     COLONOSCOPY WITH PROPOFOL N/A 08/09/2021   Procedure: COLONOSCOPY WITH PROPOFOL;  Surgeon: Regis Bill, MD;  Location: [facility];  Service: Endoscopy;  Laterality: N/A;    ESOPHAGOGASTRODUODENOSCOPY (EGD) WITH PROPOFOL N/A 08/09/2021   Procedure: ESOPHAGOGASTRODUODENOSCOPY (EGD) WITH PROPOFOL;  Surgeon: Regis Bill, MD;  Location: [facility];  Service: Endoscopy;  Laterality: N/A;   JOINT REPLACEMENT Bilateral    TOTAL KNEE REPLACEMENT   SHOULDER ARTHROSCOPY WITH OPEN ROTATOR CUFF REPAIR Left 11/23/2016   Procedure: SHOULDER ARTHROSCOPY WITH OPEN ROTATOR CUFF REPAIR, ARTHROSCOPIC SUBACROMIAL DECOMPRESSION, DISTAL CLAVICLE EXCISION;  Surgeon: Juanell Fairly, MD;  Location: [facility];  Service: Orthopedics;  Laterality: Left;   SHOULDER ARTHROSCOPY WITH OPEN ROTATOR CUFF REPAIR Right 07/30/2018   Procedure: SHOULDER ARTHROSCOPY WITH OPEN ROTATOR CUFF REPAIR,SUBACROMINAL DECOMPRESSION AND DISTAL CLAVICLE EXCISION;  Surgeon: Juanell Fairly, MD;  Location: [facility];  Service: Orthopedics;  Laterality: Right;   TOTAL KNEE REVISION Right 05/05/2020   Procedure: right total knee arthroplasty revision;  Surgeon: Lyndle Herrlich, MD;  Location: [facility];  Service: Orthopedics;  Laterality: Right;    There were no vitals filed for this visit.   Subjective Assessment - 08/16/21 1105     Subjective Patient reports she has been doing her HEP and leg kicks. She states her lymphedema and swelling has been improving. She still has some discomfort in right knee. She denies any back pain today; She presents to therapy with rollator.  She reports her walking has been improving; (Today she has completed 796 steps so far)    Pertinent History OA knees (had Left TKA in 2014 and right in 2016 and most recent revision right TKA on 05/05/2020)  and back,  OSA, HTN, morbid obesity (BMI 50.0-59.9) , hx anxiety and depression, hx cereberal aneurism,  glaucoma, hx MI, neuropathy, B rotator cuff repairs, Hx TIAs. FOLLOW LYMPHEDREMA PRECAUTIONS FOR diastolic dysfunction, asthma, CKD, dyspnea, CHF, COPD DM    Limitations Sitting;Lifting;Standing;Walking;House hold activities    How long can  you sit comfortably? varies on pain that day- it becomes stiff    How long can you stand comfortably? Not long <    How long can you walk comfortably? <5 min    Diagnostic tests None available    Patient Stated Goals To have decrease right knee pain and be able to drive and do more for myself and walk better.    Currently in Pain? Yes    Pain Score 5     Pain Location Knee    Pain Orientation Right    Pain Descriptors / Indicators Aching;Sore    Pain Type Chronic pain    Pain Onset More than a month ago   RTK 2016. S/P revision 2021. Chronic pain started ~ 2 yrs before revision and persists today   Pain Frequency Constant    Aggravating Factors  walking/prolonged standing    Pain Relieving Factors rest/heat/pain meds    Effect of Pain on Daily Activities decreased activity tolerance;    Multiple Pain Sites No                 TREATMENT: Seated with 3# ankle weight: LAQ with ankle DF 2x15 reps each LE, required cues to increase ankle DF for better flexibility and ROM;   Standing with 3# ankle weight: -heel/toe raise x15 reps; -hip flexion march x10 reps with cues to slow down LE movement and increase core stabilization for better hip flexor activation -hamstring curl x15 reps each LE -hip abduction x10 reps each LE with mod VCs to keep foot oriented forward for better gluteal activation; -mini squat x10 reps with cues for keeping hips posterior for better knee control and positioning;  Provided written HEP for adherence; Patient verbalized understanding; see patient instructions;   Gait around gym x150 feet with rollator with cues for erect posture, relax UE and increase weight bearing in LE, increase step length;   Forward/backward step ups on 4 inch step with BUE rail assist x10 reps, required CGA for safety; Pt denies any increase in knee pain;   Pt reports mild fatigue and shortness of breath after exercise, SPo2 95%, HR 110 bpm; Short rest break provided;    Patient tolerated session well. She reports no increase in pain with advanced exercise.                         PT Education - 08/16/21 1107     Education Details ROM/strengthening, HEP    Person(s) Educated Patient    Methods Explanation;Verbal cues    Comprehension Verbalized understanding;Returned demonstration;Verbal cues required;Need further instruction              PT Short Term Goals - 07/13/21 1736       PT SHORT TERM GOAL #1   Title Pt will be independent with HEP in order to decrease ankle pain and increase strength in order to improve pain-free function at home  and work.    Baseline 07/13/2021- Patient has no formal HEP in place and currently pain limited and not exercising.    Time 6    Period Weeks    Status New    Target Date 08/24/21               PT Long Term Goals - 07/13/21 1737       PT LONG TERM GOAL #1   Title Pt will improve FOTO to target score of 41  to display perceived improvements in ability to complete ADL's.    Baseline 8/31/022= 21    Time 12    Period Weeks    Status New    Target Date 10/05/21      PT LONG TERM GOAL #2   Title Pt will decrease worst pain as reported on NPRS by at least 3 points (initially 8/10 Right knee)  in order to demonstrate clinically significant reduction in ankle/foot pain.    Baseline 07/13/2021= 8/10    Time 12    Period Weeks    Status New    Target Date 10/05/21      PT LONG TERM GOAL #3   Title Pt will increase Right knee strength of  by at least 1/2 MMT grade in order to demonstrate improvement in strength and function    Baseline 07/13/2021= Right knee flex/ext = 2-/5 *pain limited    Time 12    Status New    Target Date 10/05/21      PT LONG TERM GOAL #4   Title Pt will decrease 5TSTS by at least 8 seconds in order to demonstrate clinically significant improvement in LE strength.    Baseline 07/13/2021=34.77 without UE support    Time 12    Period Weeks    Status New     Target Date 10/05/21      PT LONG TERM GOAL #5   Title Patient will present with improved right Knee flex > 105 deg AROM for improved ROM with all functional activities.    Baseline 07/13/2021= Right knee AROM = 95 deg    Time 12    Period Weeks    Status New    Target Date 10/05/21      Additional Long Term Goals   Additional Long Term Goals Yes      PT LONG TERM GOAL #6   Title Pt will decrease TUG to below 31 seconds/decrease in order to demonstrate decreased fall risk.    Baseline 07/13/2021= 54.22 sec using 4WW    Time 12    Period Weeks    Status New    Target Date 10/05/21      PT LONG TERM GOAL #7   Title Pt will increase by at least 0.2 m/s in order to demonstrate clinically significant improvement in community ambulation.    Baseline 07/13/2021= 0.32 m/s using 4WW    Time 12    Period Weeks    Status New    Target Date 10/05/21                   Plan - 08/16/21 1150     Clinical Impression Statement Patient motivated and participated well within session. She reports less pain overall this session. She was able to tolerate advanced exercise, utilizing ankle weight for resistance. Patient does have ankle weights at home. Ankle weights would be preferrable to resistance bands due to lymphedema and to reduce discomfort with pressure. Patient does require  min VCs for proper exercise technique. She requires rail assist for safety. Provided CGA intermittently throughout session for safety. Patient would benefit from additional skilled PT intervention to improve strength, balance and mobility.    Personal Factors and Comorbidities Comorbidity 3+    Comorbidities HTN, Diabetes, Lymphedema    Examination-Activity Limitations Bathing;Bed Mobility;Bend;Caring for Others;Carry;Dressing;Lift;Sleep;Squat;Stairs;Stand;Transfers    Examination-Participation Restrictions Cleaning;Community Activity;Driving;Laundry;Meal Prep    Stability/Clinical Decision Making  Stable/Uncomplicated    Rehab Potential Good    PT Frequency 2x / week    PT Duration 12 weeks    PT Treatment/Interventions ADLs/Self Care Home Management;Cryotherapy;Moist Heat;DME Instruction;Gait training;Stair training;Functional mobility training;Therapeutic activities;Therapeutic exercise;Balance training;Neuromuscular re-education;Patient/family education;Orthotic Fit/Training;Manual techniques;Compression bandaging;Scar mobilization;Passive range of motion;Dry needling;Taping    PT Next Visit Plan Instruct patient in ROM, Strengthening and intiate home program exercises.    PT Home Exercise Plan Will initiate next visit    Consulted and Agree with Plan of Care Patient             Patient will benefit from skilled therapeutic intervention in order to improve the following deficits and impairments:  Abnormal gait, Decreased balance, Decreased activity tolerance, Decreased endurance, Decreased mobility, Decreased range of motion, Decreased skin integrity, Decreased strength, Difficulty walking, Hypomobility, Impaired flexibility, Obesity, Pain  Visit Diagnosis: Abnormality of gait and mobility  Difficulty in walking, not elsewhere classified  Muscle weakness (generalized)  Unsteadiness on feet     Problem List Patient Active Problem List   Diagnosis Date Noted   Chest pain 02/05/2021   SOB (shortness of breath) 02/05/2021   Leg swelling 02/05/2021   Nonrheumatic mitral valve regurgitation 02/05/2021   Morbid obesity (HCC) 02/05/2021   S/P revision of total knee, right 05/05/2020   S/P right rotator cuff repair 07/30/2018   S/P left rotator cuff repair 11/23/2016   Benign essential hypertension 02/05/2003    Izeah Vossler, PT, DPT 08/16/2021, 11:52 AM   [facility] MAIN [facility] SERVICES [address] [place], [place], [address] Phone: [phone]   Fax:  [phone]  Name: Kristine Rangel MRN: [id number] Date of Birth:  1956-02-21

## 2021-08-16 NOTE — Patient Instructions (Signed)
Access Code: [facility] URL: [email or web address] Date: 08/16/2021 Prepared by: Zettie Pho  Exercises Heel Raises with Counter Support - 1 x daily - 7 x weekly - 2 sets - 15 reps Standing March with Counter Support - 1 x daily - 7 x weekly - 2 sets - 10 reps Standing Hip Abduction with Counter Support - 1 x daily - 7 x weekly - 2 sets - 10 reps Standing Knee Flexion with Unilateral Counter Support - 1 x daily - 7 x weekly - 2 sets - 10 reps Mini Squat with Counter Support - 1 x daily - 7 x weekly - 2 sets - 10 reps

## 2021-08-16 NOTE — Patient Instructions (Signed)

## 2021-08-16 NOTE — Telephone Encounter (Signed)
  Patient Consent for Virtual Visit        Kristine Rangel has provided verbal consent on 08/16/2021 for a virtual visit (video or telephone).   CONSENT FOR VIRTUAL VISIT FOR:  Kristine Rangel  By participating in this virtual visit I agree to the following:  I hereby voluntarily request, consent and authorize [facility] and its employed or contracted physicians, physician assistants, nurse practitioners or other licensed health care professionals (the Practitioner), to provide me with telemedicine health care services (the "Services") as deemed necessary by the treating Practitioner. I acknowledge and consent to receive the Services by the Practitioner via telemedicine. I understand that the telemedicine visit will involve communicating with the Practitioner through live audiovisual communication technology and the disclosure of certain medical information by electronic transmission. I acknowledge that I have been given the opportunity to request an in-person assessment or other available alternative prior to the telemedicine visit and am voluntarily participating in the telemedicine visit.  I understand that I have the right to withhold or withdraw my consent to the use of telemedicine in the course of my care at any time, without affecting my right to future care or treatment, and that the Practitioner or I may terminate the telemedicine visit at any time. I understand that I have the right to inspect all information obtained and/or recorded in the course of the telemedicine visit and may receive copies of available information for a reasonable fee.  I understand that some of the potential risks of receiving the Services via telemedicine include:  Delay or interruption in medical evaluation due to technological equipment failure or disruption; Information transmitted may not be sufficient (e.g. poor resolution of images) to allow for appropriate medical decision making by the Practitioner; and/or   In rare instances, security protocols could fail, causing a breach of personal health information.  Furthermore, I acknowledge that it is my responsibility to provide information about my medical history, conditions and care that is complete and accurate to the best of my ability. I acknowledge that Practitioner's advice, recommendations, and/or decision may be based on factors not within their control, such as incomplete or inaccurate data provided by me or distortions of diagnostic images or specimens that may result from electronic transmissions. I understand that the practice of medicine is not an exact science and that Practitioner makes no warranties or guarantees regarding treatment outcomes. I acknowledge that a copy of this consent can be made available to me via my patient portal [facility] MyChart), or I can request a printed copy by calling the office of [facility].    I understand that my insurance will be billed for this visit.   I have read or had this consent read to me. I understand the contents of this consent, which adequately explains the benefits and risks of the Services being provided via telemedicine.  I have been provided ample opportunity to ask questions regarding this consent and the Services and have had my questions answered to my satisfaction. I give my informed consent for the services to be provided through the use of telemedicine in my medical care

## 2021-08-17 ENCOUNTER — Encounter: Payer: Self-pay | Admitting: Physician Assistant

## 2021-08-17 ENCOUNTER — Ambulatory Visit (INDEPENDENT_AMBULATORY_CARE_PROVIDER_SITE_OTHER): Payer: Medicare HMO | Admitting: Physician Assistant

## 2021-08-17 VITALS — BP 157/96 | HR 89 | Ht 65.0 in | Wt 283.0 lb

## 2021-08-17 DIAGNOSIS — R0789 Other chest pain: Secondary | ICD-10-CM

## 2021-08-17 DIAGNOSIS — I890 Lymphedema, not elsewhere classified: Secondary | ICD-10-CM

## 2021-08-17 DIAGNOSIS — I10 Essential (primary) hypertension: Secondary | ICD-10-CM | POA: Diagnosis not present

## 2021-08-17 DIAGNOSIS — G4733 Obstructive sleep apnea (adult) (pediatric): Secondary | ICD-10-CM

## 2021-08-17 DIAGNOSIS — R0602 Shortness of breath: Secondary | ICD-10-CM

## 2021-08-17 DIAGNOSIS — Z6841 Body Mass Index (BMI) 40.0 and over, adult: Secondary | ICD-10-CM

## 2021-08-17 DIAGNOSIS — I5189 Other ill-defined heart diseases: Secondary | ICD-10-CM

## 2021-08-17 MED ORDER — CARVEDILOL 6.25 MG PO TABS: 6.2500 mg | ORAL_TABLET | Freq: Two times a day (BID) | ORAL | 3 refills | Status: DC

## 2021-08-17 MED ORDER — ISOSORBIDE MONONITRATE ER 60 MG PO TB24: 90.0000 mg | ORAL_TABLET | Freq: Every day | ORAL | 1 refills | Status: DC

## 2021-08-17 NOTE — Progress Notes (Signed)
Virtual Visit via Telephone Note   This visit type was conducted due to national recommendations for restrictions regarding the COVID-19 Pandemic (e.g. social distancing) in an effort to limit this patient's exposure and mitigate transmission in our community.  Due to her co-morbid illnesses, this patient is at least at moderate risk for complications without adequate follow up.  This format is felt to be most appropriate for this patient at this time.  The patient did not have access to video technology/had technical difficulties with video requiring transitioning to audio format only (telephone).  All issues noted in this document were discussed and addressed.  No physical exam could be performed with this format.  Please refer to the patient's chart for her  consent to telehealth for [facility].   Date:  08/17/2021   ID:  Kristine Rangel, DOB 27-Feb-1956, MRN [id number]  Patient Location: Home Provider Location: Home Office  PCP:  Leanna Sato, MD  Cardiologist:  Yvonne Kendall, MD  Electrophysiologist:  None   Evaluation Performed:  Follow-Up Visit  Chief Complaint:  65 y.o. female with history of hypertension, hyperlipidemia, DM2, cerebral aneurysm s/p coiling, COPD, HFpEF, and here today for follow-up.  No chief complaint on file.    History of Present Illness:    Kristine Rangel is a 65 y.o. female with PMH as above, previously seen by Dr. Juliann Pares of [facility] cardiology.  [facility], complaining of worsening right leg pain.Sent to the ED and noted 2-day history of chest pain, where she r/o for ACS, PE, and DVT.   She was initially seen in the office 02/04/2021 by Dr. Okey Dupre for chest pain at the request of Mr. Yetta Barre.  She reported intermittent chest pain, radiating to the stomach and back and usually lasting 10 to 15 minutes.  The pain occurred randomly and was not associated with activity or eating.  No  relief with GI rx/ PPI.  She had dizziness and SOB, as well as LEE with right greater than that of left.  She had 3 pillow orthopnea.  She reported her CP similar to 2017 with Dr. Juliann Pares.    It was felt that her chest pain was atypical, as it was not exertional and occurred randomly.  GI etiology was considered, given recent CTA with esophageal thickening.  Coronary CTA was obtained with coronary artery calcium score of 0.  Echo was obtained with results as below and EF 60 to 65%, G2 DD, RVSP 32.6 mmHg with RAP estimated 3 mmHg, mild LAE,.  LEE thought to reflect venous insufficiency and/or lymphedema.  She was continued on Lasix 40 mg twice daily.  Diet and exercise recommended.   Seen 03/11/2021 s/p right knee replacement.  She had lost her lymphedema pumps.  Compression stockings cut into her legs with ACE bandages discussed.  She reported 3 pillow orthopnea, PND, and fatigue.  She had ongoing CP. GI appointment pending.  She was trying to eliminate salt and walk daily, as well as eat 3 small meals per day.  She was going to join [employer].  CP improved with improvement in BP and Imdur at follow-ups. She reported ongoing shortness of breath, dyspnea, and cough.  With improved BP, she was sleeping on 2 pillows.  She was waiting on the lymphedema clinic, OSA study, and GI.  She had not yet joined [employer] sneakers but was walking every day.      Sleep study showed mild  obstructive sleep apnea with no central sleep apnea. Recommendation was for auto CPAP therapy and oral appliance was discussed.  If intolerant to CPAP therapy, ENT referral for possible hypoglossal nerve stimulator was recommended.  Follow-up pulse ox study on CPAP was recommended to assess if supplemental oxygen or bilevel therapy was needed.  Weight loss advised.   Seen 05/02/2021 with ongoing but less frequent CP and diaphoresis.  CP improved with lying flat, lasting 10 minutes. She was waiting on her CPAP and lymphedema appt on 23rd.   She was waiting on a [employer] brachial cuff.  At home, SBP 150 max.  She was still about to join [employer].  She had ordered a bike and was using a home pedal machine. She was walking around the house with her walker at least 2-3 times per week [facility] [employer] [facility] [facility].She had some DOE, fatigue, palpitations. She still had two-pillow orthopnea. She was started on Coreg.   Today, 08/17/2021, she returns to clinic and notes that she has been doing well since her last visit.  She reports ongoing atypical chest pain, usually occurring after eating meat.  SOB/DOE/LEE stable. She is still waiting for her lymphedema pumps.  She reports someone comes by the house and wraps her legs and bandages.  She was seen by GI with colonoscopy and endoscopy performed as under her procedures tab. Colonoscopy showed diverticulosis in the sigmoid colon, internal hemorrhoids.  EGD showed small hiatal hernia.  She reports using 2 pillows for comfort.  She has lost weight from her previous clinic visit.  She is still using her CPAP.  She has not yet started biking but does report that she still has her pedal machine at home.  She is trying to walk, though walking less frequently.  She reports home SBP 150s to 160s.  Rates 70s to 80s.  The patient does not have symptoms concerning for COVID-19 infection (fever, chills, cough, or new shortness of breath).   Past Medical History:  Diagnosis Date   Anxiety    Arthritis    Asthma    Bladder leak    Cerebral aneurysm    history-has coils   CHF (congestive heart failure) (HCC)    Chronic kidney disease    COPD (chronic obstructive pulmonary disease) (HCC)    Depression    Diabetes mellitus without complication (HCC)    Dyspnea    GERD (gastroesophageal reflux disease)    Glaucoma    Headache    History of kidney stones    Hyperlipidemia    Hypertension    MI (myocardial infarction) (HCC)    unsure when   Neuropathy    Rotator  cuff injury    Seasonal allergies    Stroke [facility])    TIA's   Past Surgical History:  Procedure Laterality Date   BRAIN SURGERY Right 2010   anuerysm repair   CARPAL TUNNEL RELEASE Right 06/12/2017   Procedure: CARPAL TUNNEL RELEASE;  Surgeon: Juanell Fairly, MD;  Location: [facility] ORS;  Service: Orthopedics;  Laterality: Right;   CHOLECYSTECTOMY     COLONOSCOPY WITH PROPOFOL N/A 08/09/2021   Procedure: COLONOSCOPY WITH PROPOFOL;  Surgeon: Regis Bill, MD;  Location: [facility] END[facility]COPY;  Service: Endoscopy;  Laterality: N/A;   ESOPHAGOGASTRODUODENOSCOPY (EGD) WITH PROPOFOL N/A 08/09/2021   Procedure: ESOPHAGOGASTRODUODENOSCOPY (EGD) WITH PROPOFOL;  Surgeon: Regis Bill, MD;  Location: [facility] ENDOSCOPY;  Service: Endoscopy;  Laterality: N/A;   JOINT REPLACEMENT Bilateral  TOTAL KNEE REPLACEMENT   SHOULDER ARTHROSCOPY WITH OPEN ROTATOR CUFF REPAIR Left 11/23/2016   Procedure: SHOULDER ARTHROSCOPY WITH OPEN ROTATOR CUFF REPAIR, ARTHROSCOPIC SUBACROMIAL DECOMPRESSION, DISTAL CLAVICLE EXCISION;  Surgeon: Juanell Fairly, MD;  Location: [facility] ORS;  Service: Orthopedics;  Laterality: Left;   SHOULDER ARTHROSCOPY WITH OPEN ROTATOR CUFF REPAIR Right 07/30/2018   Procedure: SHOULDER ARTHROSCOPY WITH OPEN ROTATOR CUFF REPAIR,SUBACROMINAL DECOMPRESSION AND DISTAL CLAVICLE EXCISION;  Surgeon: Juanell Fairly, MD;  Location: [facility] OR[facility];  Service: Orthopedics;  Laterality: Right;   TOTAL KNEE REVISION Right 05/05/2020   Procedure: right total knee arthroplasty revision;  Surgeon: Lyndle Herrlich, MD;  Location: [facility] ORS;  Service: Orthopedics;  Laterality: Right;     No outpatient medications have been marked as taking for the 08/17/21 encounter (Appointment) with Lennon Alstrom, PA-C.     Allergies:   Bee venom and Penicillins   Social History   Tobacco Use   Smoking status: Former   Smokeless tobacco: Current    Types: Snuff  Vaping Use   Vaping Use: Never used  Substance Use Topics    Alcohol use: No   Drug use: No     Family Hx: The patient's family history includes Breast cancer in her maternal aunt; Diabetes in her mother; Heart attack in her paternal grandfather; Stroke in her paternal grandfather.  ROS:   Please see the history of present illness.    She denies palpitations, pnd, orthopnea, n, v, dizziness, syncope, weight gain, or early satiety.  She reports ongoing atypical chest pain.  She has stable dyspnea and edema.  She reports recent weight loss. All other systems reviewed and are negative.  Objective:    Vital Signs:  There were no vitals taken for this visit.   VITAL SIGNS:  reviewed GEN:  no acute distress NEURO:  alert and oriented x 3, no obvious focal deficit PSYCH:  normal affect  Accessory clinical findings reviewed:    EKG:  No ECG reviewed.  Previous vitals reviewed today:    Temp Readings from Last 3 Encounters:  08/09/21 97.8 F (36.6 C) (Temporal)  04/08/21 98.3 F (36.8 C) (Oral)  01/28/21 98.3 F (36.8 C) (Oral)   BP Readings from Last 3 Encounters:  08/09/21 (!) 107/59  07/13/21 121/66  05/02/21 122/80   Pulse Readings from Last 3 Encounters:  08/09/21 76  07/13/21 72  05/02/21 80    Wt Readings from Last 3 Encounters:  08/09/21 297 lb (134.7 kg)  07/13/21 280 lb (127 kg)  05/02/21 (!) 302 lb (137 kg)     Labs reviewed today:    CareEverwhere Labs present? No  Lab Results  Component Value Date   WBC 4.7 04/08/2021   HGB 11.1 (L) 04/08/2021   HCT 34.1 (L) 04/08/2021   MCV 83.4 04/08/2021   PLT 364 04/08/2021   Lab Results  Component Value Date   CREATININE 1.18 (H) 04/25/2021   BUN 16 04/25/2021   NA 139 04/25/2021   K 3.8 04/25/2021   CL 103 04/25/2021   CO2 28 04/25/2021   Lab Results  Component Value Date   ALT 21 01/28/2021   AST 30 01/28/2021   ALKPHOS 105 01/28/2021   BILITOT 0.6 01/28/2021   No results found for: CHOL, HDL, LDLCALC, LDLDIRECT, TRIG, CHOLHDL  Lab Results   Component Value Date   HGBA1C 6.1 (H) 07/24/2018   No results found for: TSH   Prior CV Studies reviewed today:    The following studies were reviewed  today:  Echo 02/2021  1. Left ventricular ejection fraction, by estimation, is 60 to 65%. The  left ventricle has normal function. The left ventricle has no regional  wall motion abnormalities. Left ventricular diastolic parameters are  consistent with Grade II diastolic  dysfunction (pseudonormalization).   2. Right ventricular systolic function is normal. The right ventricular  size is normal. There is normal pulmonary artery systolic pressure. The  estimated right ventricular systolic pressure is 32.6 mmHg.   3. Left atrial size was mildly dilated.   CT coronary score 02/24/21 IMPRESSION: 1. Normal coronary calcium score of 0. Patient is low risk for coronary events. 2. Normal coronary origin with right dominance. 3. No evidence of CAD. 4. CAD-RADS 0. Consider non-atherosclerotic causes of chest pain.   Echo 03/03/21  1. Left ventricular ejection fraction, by estimation, is 60 to 65%. The  left ventricle has normal function. The left ventricle has no regional  wall motion abnormalities. Left ventricular diastolic parameters are  consistent with Grade II diastolic  dysfunction (pseudonormalization).   2. Right ventricular systolic function is normal. The right ventricular  size is normal. There is normal pulmonary artery systolic pressure. The  estimated right ventricular systolic pressure is 32.6 mmHg.   3. Left atrial size was mildly dilated.   Previous visit STOP-BANG Rex assessment   S-snore  Have you been told that you snore?   Y  T-tired Are you often tired, fatigued, sleepy during the day?   Y  O-obstruction Do you stop breathing, choke, or gasp during sleep?   Y  P-pressure Do you have or are you being treated for high blood pressure?  Y  B- BMI Is your BMI greater than 35 kg/m?  Y  A- age Are you 50  years or older?  Y  N-neck Do you have a neck circumference greater than 16 inches?  Y  G-gender Are you female?   N  Total  7    Risk Score <3 Low risk of OSA ?3 High risk of OSA     ASSESSMENT & PLAN:    Chest pain, atypical -- Reports ongoing atypical chest pain, improved in severity since addition of Imdur.  GI work-up did show findings that we discussed could be the etiology of her discomfort.  Reviewed that previous coronary CT and echo have been reassuring.  Low suspicion for chest pain 2/2 coronary insufficiency.  No indication for further ischemic work-up.  Increase to Imdur 90 mg daily for additional antianginal effect and relief from esophageal spasm/stricture.  Continue as needed nitro.  Will increase carvedilol to 6.25 mg twice daily for additional antianginal effect and BP/rate control.  Reviewed recommendations for heart healthy diet/increased activity/PT, as well as aggressive treatment of risk factors with patient understanding.  Ongoing GI follow-up encouraged.   HTN, BP goal <130/80 -- BP still suboptimal today on home cuff at 157/96, though improved with previous medication changes.    Will increase to Imdur 90 mg daily for antianginal effect and BP support, as well as carvedilol to 6.25 mg twice daily for antianginal effect, BP support, and rate control.  Continue current Lasix.  Recommend regular home BP checks/weight checks at home to monitor volume status.  Continue to recommend low-salt diet and monitoring of fluids.  Diet and exercise encouraged.  Previous WatchPat showed diagnosis of sleep apnea with CPAP received and ongoing use encouraged.   Grade 2 diastolic dysfunction -- Reports stable symptoms of dyspnea.  Previous echo with grade 2  diastolic dysfunction, RVSP 32.6 mmHg with RAP estimated at 3 mmHg, and mild LAE.  Encouraged ongoing control of blood pressure and ongoing CPAP use.  She reports recent weight loss.  As previously noted, LEE likely 2/2 venous  insufficiency/lymphedema.  She is still waiting to be seen by the lymphedema clinic and thus is still waiting on lymphedema pumps.  Recommend ongoing leg elevation and compression as she has been doing at home.  Continue current Lasix, carvedilol, and Imdur.   Lymphedema/venous insufficiency -- Previously owned lymphedema pumps.  At previous visits, information for venous insufficiency/lymphedema clinic provided to patient during visit and placed on AVS as well.  Continue to recommend leg wraps and leg elevation.   OSA on CPAP -- Watch Pat completed with recommendation for CPAP and oral appliance, as well as possible night oxygen or hypoglossal stimulation and as outlined directly above.  Continue CPAP.   BMI 45.0-50.0 -- She reports recent weight loss.  BMI 50.09  47.09 with encouragement for weight loss through diet and exercise.  Weight loss will also help improve obstructive sleep apnea, and is recommended for BP and glycemic control, as well as overall health.     Medication changes: Increase to Imdur 90 mg daily, carvedilol 6.25 mg twice daily Labs ordered: None Studies / Imaging ordered: None Referral provided: None.  Future considerations: Escalation of antihypertensives, ENT referral if indicated Disposition: RTC 4 months, sooner if needed.  COVID-19 Education: The signs and symptoms of COVID-19 were discussed with the patient and how to seek care for testing (follow up with PCP or arrange E-visit).  The importance of social distancing was discussed today.  Time:   Today, I have spent 40 minutes with the patient with telehealth technology discussing the above problems.     Medication Adjustments/Labs and Tests Ordered: Current medicines are reviewed at length with the patient today.  Concerns regarding medicines are outlined above.    Disposition: In Person in 4 month(s)  Signed, Tedd Sias  08/17/2021  [facility]

## 2021-08-17 NOTE — Patient Instructions (Signed)
Medication Instructions:   Your physician has recommended you make the following change in your medication:    INCREASE your IMDUR (Isosorbide) to 90 MG once a day.  2.    INCREASE your Carvedilol (Coreg) to 6.25 MG twice a day.   *If you need a refill on your cardiac medications before your next appointment, please call your pharmacy*   Lab Work: None ordered If you have labs (blood work) drawn today and your tests are completely normal, you will receive your results only by: MyChart Message (if you have MyChart) OR A paper copy in the mail If you have any lab test that is abnormal or we need to change your treatment, we will call you to review the results.   Testing/Procedures: None ordered   Follow-Up: At [facility], you and your health needs are our priority.  As part of our continuing mission to provide you with exceptional heart care, we have created designated Provider Care Teams.  These Care Teams include your primary Cardiologist (physician) and Advanced Practice Providers (APPs -  Physician Assistants and Nurse Practitioners) who all work together to provide you with the care you need, when you need it.  We recommend signing up for the patient portal called "MyChart".  Sign up information is provided on this After Visit Summary.  MyChart is used to connect with patients for Virtual Visits (Telemedicine).  Patients are able to view lab/test results, encounter notes, upcoming appointments, etc.  Non-urgent messages can be sent to your provider as well.   To learn more about what you can do with MyChart, go to [email or web address].    Your next appointment:   4 month(s)  The format for your next appointment:   In Person  Provider:   You may see Yvonne Kendall, MD or one of the following Advanced Practice Providers on your designated Care Team:   Nicolasa Ducking, NP Eula Listen, PA-C Marisue Ivan, PA-C Cadence Fransico Michael, [place]   Other Instructions

## 2021-08-18 ENCOUNTER — Other Ambulatory Visit: Payer: Self-pay

## 2021-08-18 ENCOUNTER — Ambulatory Visit: Payer: Medicare HMO

## 2021-08-18 ENCOUNTER — Ambulatory Visit: Payer: Medicare HMO | Admitting: Occupational Therapy

## 2021-08-18 DIAGNOSIS — M6281 Muscle weakness (generalized): Secondary | ICD-10-CM

## 2021-08-18 DIAGNOSIS — I890 Lymphedema, not elsewhere classified: Secondary | ICD-10-CM

## 2021-08-18 DIAGNOSIS — R2681 Unsteadiness on feet: Secondary | ICD-10-CM

## 2021-08-18 DIAGNOSIS — R269 Unspecified abnormalities of gait and mobility: Secondary | ICD-10-CM

## 2021-08-18 DIAGNOSIS — R262 Difficulty in walking, not elsewhere classified: Secondary | ICD-10-CM

## 2021-08-18 NOTE — Therapy (Signed)
[place] MAIN [facility] SERVICES [address] [place], [place], [address] Phone: [phone]   Fax:  [phone]  Occupational Therapy Treatment  Patient Details  Name: Kristine Rangel MRN: [id number] Date of Birth: [birth year] Referring Provider (OT): Arvil Chaco, PA-C   Encounter Date: 08/18/2021   OT End of Session - 08/18/21 1017     Visit Number 18    Number of Visits 36    Date for OT Re-Evaluation 11/14/21    OT Start Time 1005    OT Stop Time 1100    OT Time Calculation (min) 55 min    Equipment Utilized During Treatment assistive donning devices: Friction gloves, Tyvek slipper sock    Activity Tolerance Patient tolerated treatment well;No increased pain    Behavior During Therapy WFL for tasks assessed/performed             Past Medical History:  Diagnosis Date   Anxiety    Arthritis    Asthma    Bladder leak    Cerebral aneurysm    history-has coils   CHF (congestive heart failure) (HCC)    Chronic kidney disease    COPD (chronic obstructive pulmonary disease) (HCC)    Depression    Diabetes mellitus without complication (HCC)    Dyspnea    GERD (gastroesophageal reflux disease)    Glaucoma    Headache    History of kidney stones    Hyperlipidemia    Hypertension    MI (myocardial infarction) ([place])    unsure when   Neuropathy    Rotator cuff injury    Seasonal allergies    Stroke [facility])    TIA's    Past Surgical History:  Procedure Laterality Date   BRAIN SURGERY Right 2010   anuerysm repair   CARPAL TUNNEL RELEASE Right 06/12/2017   Procedure: CARPAL TUNNEL RELEASE;  Surgeon: Thornton Park, MD;  Location: [facility] ORS;  Service: Orthopedics;  Laterality: Right;   CHOLECYSTECTOMY     COLONOSCOPY WITH PROPOFOL N/A 08/09/2021   Procedure: COLONOSCOPY WITH PROPOFOL;  Surgeon: Lesly Rubenstein, MD;  Location: [facility] ENDOSCOPY;  Service: Endoscopy;  Laterality: N/A;   ESOPHAGOGASTRODUODENOSCOPY (EGD) WITH  PROPOFOL N/A 08/09/2021   Procedure: ESOPHAGOGASTRODUODENOSCOPY (EGD) WITH PROPOFOL;  Surgeon: Lesly Rubenstein, MD;  Location: [facility] ENDOSCOPY;  Service: Endoscopy;  Laterality: N/A;   JOINT REPLACEMENT Bilateral    TOTAL KNEE REPLACEMENT   SHOULDER ARTHROSCOPY WITH OPEN ROTATOR CUFF REPAIR Left 11/23/2016   Procedure: SHOULDER ARTHROSCOPY WITH OPEN ROTATOR CUFF REPAIR, ARTHROSCOPIC SUBACROMIAL DECOMPRESSION, DISTAL CLAVICLE EXCISION;  Surgeon: Thornton Park, MD;  Location: [facility] ORS;  Service: Orthopedics;  Laterality: Left;   SHOULDER ARTHROSCOPY WITH OPEN ROTATOR CUFF REPAIR Right 07/30/2018   Procedure: SHOULDER ARTHROSCOPY WITH OPEN ROTATOR CUFF REPAIR,SUBACROMINAL DECOMPRESSION AND DISTAL CLAVICLE EXCISION;  Surgeon: Thornton Park, MD;  Location: [facility] ORS;  Service: Orthopedics;  Laterality: Right;   TOTAL KNEE REVISION Right 05/05/2020   Procedure: right total knee arthroplasty revision;  Surgeon: Lovell Sheehan, MD;  Location: [facility] ORS;  Service: Orthopedics;  Laterality: Right;    There were no vitals filed for this visit.   Subjective Assessment - 08/18/21 1017     Subjective  Ms Grenfell presents for OT Rx visit 17/24  to address bilateral lower extremity lipo-lymphedema. Pt endorses B knee joint pain, but denies LE related pain this morning. Pt is un accompanied  by paid caregiver, Georgina Quint.   She presents with compression wraps in place on  the LLE and custom compression garment  on the RLE. Pt denies new LE related complaints.    Pertinent History OA knees and back,  OSA, HTN, morbid obesity (BMI 50.0-59.9) , hx anxiety and depression, hx cereberal aneurism,  glaucoma, hx MI, neuropathy, B rotator cuff repairs, Hx TIAs. FOLLOW LYMPHEDREMA PRECAUTIONS FOR diastolic dysfunction, asthma, CKD, dyspnea, CHF, COPD DM    Limitations difficulty walking, impaired standing balance, dyspnea, decreased dynamic balance, decreased LB strength, decreased A/PROM 2/2 body habitus and skin  approximation,  increased supportive care needs and decreased quality of life, impaired body image    Repetition Increases Symptoms    Special Tests Intake FOTO score 08/18/21: 32/100    Patient Stated Goals Get the swelling down in my legs    Currently in Pain? No/denies    Pain Score 0-No pain    Pain Location Leg    Pain Orientation Left;Right    Pain Descriptors / Indicators Aching;Discomfort;Heaviness;Sharp;Sore;Throbbing    Pain Type Chronic pain    Pain Onset More than a month ago   RTK 2016. S/P revision 2021. Chronic pain started ~ 2 yrs before revision and persists today   Aggravating Factors  transfers, standing, walking, lifting legs    Pain Relieving Factors put it out of my mind, suck it up and deal with it, thank God I'm here another day    Multiple Pain Sites --    Pain Score 0    Pain Location --    Pain Type --    Pain Onset --    Pain Score 0    Pain Location --                          OT Treatments/Exercises (OP) - 08/18/21 1306       ADLs   ADL Education Given Yes      Manual Therapy   Manual Therapy Edema management;Manual Lymphatic Drainage (MLD);Compression Bandaging    Manual therapy comments skin care to LLE wit MLD    Edema Management anatomical measurements for LLE custom compression knee highs and HOS devices    Manual Lymphatic Drainage (MLD) to RLE/RLQ as established    Compression Bandaging Compression wraps switched to LLE today utilizing same gradient techniques after sucessfully fitting RLE daytime stocking and HOS device. 3 layer gradiant compression wraps  from base of toes to tibial tuberosity using 10 and 12 " wide short stretch bandages over 0.4 cm thick, single layer of Rosidal fam over cotton stockinett.                    OT Education - 08/18/21 1307     Education Details Continued Pt/ CG edu for lymphedema self care  and home program throughout session. Topics include multilayer, gradient compression  wrapping, simple self-MLD, therapeutic lymphatic pumping exercises, skin/nail care, risk reduction factors and LE precautions, compression garments/recommendations and wear and care schedule and compression garment donning / doffing using assistive devices. All questions answered to the Pt's satisfaction, and Pt demonstrates understanding by report.    Person(s) Educated Patient;Caregiver(s)    Methods Explanation;Demonstration;Handout    Comprehension Verbalized understanding;Returned demonstration;Need further instruction                 OT Long Term Goals - 08/03/21 1211       OT LONG TERM GOAL #1   Title Patient's intake functional score on the FOTO tool is 12 out of 100 (higher  number = greater function). Given this patient's comorbidities she will experience at least an increase in function of 6 points, or higher, to increase level of independence with basic ADLs and functional ambulation and mobility.    Baseline 12/100    Time 12    Period Weeks    Status On-going   9th visit 06/20/21 score 12/100. Functional score unchanged frm intake. No measurable progress towards this goal to date.     OT LONG TERM GOAL #2   Title Pt will be able to apply multi-layer, short stretch compression wraps to one leg at a time using correct gradient techniques with maximum caregiver assistance in an effort  to return the affected  limb(s)  to premorbid size and shape, to limit pain and infection risk, and to improve functional mobility and ambulation  for ADLs.    Baseline dependent    Time 4    Period Days    Status Achieved   paid caregiver x 2 able to correctly apply wraps.     OT LONG TERM GOAL #3   Title Pt verbalize  4 primary lymphedema  precautions and prevention strategies using printed reference (modified independence) to reduce infection risk limit progression of lymphedema.    Baseline Max A    Time 4    Period Days    Status Achieved      OT LONG TERM GOAL #4   Title Pt will  achieve at least 10% limb volume reduction bilaterally during Intensive Phase CDT to prevent re-accumulation of lymphatic congestion and progression of fibrosis, to limit infection risk, to improve functional ambulation and transfers, and to improve functional performance of ADLs.    Baseline dependent    Time 12    Period Weeks    Status Partially Met   RLE dec 9.84%. RLE GOAL MET     OT LONG TERM GOAL #5   Title Pt able to donn and doff custom daytime compression garments and HOS devices with Max CG assistance to prevent lymphatic reaccumulation, to limit fibrosis formation leading to progression.    Baseline Dependent    Time 12    Period Weeks    Status Achieved                   Plan - 08/18/21 1259     Clinical Impression Statement Completed anatomical measurements for LLE custom cmpression stocking and faxed to vendor after session. Pt very pleased with fit and function of RLE custom stocking. Fitting is complete for RLE. Provided brief LLE/LLQ MLD  proir to completing custom garments measurements. Reapplied multilayer wraps at end of session.  Cont as per POC.    OT Occupational Profile and History Comprehensive Assessment- Review of records and extensive additional review of physical, cognitive, psychosocial history related to current functional performance    Occupational performance deficits (Please refer to evaluation for details): ADL's;IADL's;Rest and Sleep;Work;Leisure;Social Participation    Body Structure / Function / Physical Skills ADL;Edema;Skin integrity;Mobility;Pain;Decreased knowledge of precautions;Decreased knowledge of use of DME;IADL;Balance;Endurance;Strength;UE functional use;Obesity;Vision;Gait;ROM;Sensation    Rehab Potential Fair    Clinical Decision Making Several treatment options, min-mod task modification necessary    Comorbidities Affecting Occupational Performance: Presence of comorbidities impacting occupational performance    Comorbidities  impacting occupational performance description: Suspected lipedema. Possible CVI. Comorbidities that contribute to systemic fluid retention and lymphatic overload  include CKD, CHF, HTN, OSA, morbid obesity, chronic joint inflammation    Modification or Assistance to Complete Evaluation  Min-Moderate  modification of tasks or assist with assess necessary to complete eval    OT Frequency 2x / week    OT Duration 12 weeks   and PRN.   OT Treatment/Interventions Self-care/ADL training;Therapeutic exercise;Manual Therapy;Energy conservation;Manual lymph drainage;Therapeutic activities;DME and/or AE instruction;Compression bandaging;Other (comment);Patient/family education   Fit with custom daytime compression garments and HOS devices designed to limit lympphedema progression and facilitate increased lymphatic circulation  during hours of sleep   Plan Intensive and Self-Management Complete Decongestive Therapy (CDT): Treat one leg at a time only to limit excessive fluid return to the heart and to limit falls risk. Provide Manual lymphatic Drainage (MLD), skin care with low ph lotion/ oil to increase skin hydrarion and mobility, therapeutic lymphatic pumping ther ex and multilayer gradient compression wraps to reduce limb volume. Lastly, fit appropriate compression garments to retain clinic al gains.    Recommended Other Services If cardiology is in agreement, fit with advanced sequential pneumatic compression device, or Flexitouch to decongest lymphatic fluid build up in fatty tissues under the skin.This device wil enable Pt to manage lipo-lymphedema at home over time with fluid decongestion and pain relief. OT to arrange trial with manufacturer's rep is in agreement.    Consulted and Agree with Plan of Care Patient             Patient will benefit from skilled therapeutic intervention in order to improve the following deficits and impairments:   Body Structure / Function / Physical Skills: ADL, Edema,  Skin integrity, Mobility, Pain, Decreased knowledge of precautions, Decreased knowledge of use of DME, IADL, Balance, Endurance, Strength, UE functional use, Obesity, Vision, Gait, ROM, Sensation       Visit Diagnosis: Lymphedema, not elsewhere classified    Problem List Patient Active Problem List   Diagnosis Date Noted   Chest pain 02/05/2021   SOB (shortness of breath) 02/05/2021   Leg swelling 02/05/2021   Nonrheumatic mitral valve regurgitation 02/05/2021   Morbid obesity ([place]) 02/05/2021   S/P revision of total knee, right 05/05/2020   S/P right rotator cuff repair 07/30/2018   S/P left rotator cuff repair 11/23/2016   Benign essential hypertension 02/05/2003    Andrey Spearman, MS, OTR/L, CLT-LANA 08/18/21 1:10 PM   [place] MAIN [facility] SERVICES [address] [place], [place], [address] Phone: [phone]   Fax:  [phone]  Name: IWALANI TEMPLETON MRN: [id number] Date of Birth: September 23, 1956

## 2021-08-18 NOTE — Therapy (Signed)
[place] MAIN [facility] SERVICES [address] [place], [place], [address] Phone: [phone]   Fax:  [phone]  Physical Therapy Treatment  Patient Details  Name: Kristine Rangel MRN: [id number] Date of Birth: 03-Dec-1955 Referring Provider (PT): Dr. Cassell Smiles   Encounter Date: 08/18/2021   PT End of Session - 08/18/21 1106     Visit Number 4    Number of Visits 25    Date for PT Re-Evaluation 10/05/21    Authorization Time Period Initial Cert period= 07/13/2021 through 10/05/2021    PT Start Time 1100    PT Stop Time 1146    PT Time Calculation (min) 46 min    Equipment Utilized During Treatment Gait belt    Activity Tolerance Patient tolerated treatment well;No increased pain;Patient limited by pain    Behavior During Therapy [facility] for tasks assessed/performed             Past Medical History:  Diagnosis Date   Anxiety    Arthritis    Asthma    Bladder leak    Cerebral aneurysm    history-has coils   CHF (congestive heart failure) (HCC)    Chronic kidney disease    COPD (chronic obstructive pulmonary disease) (HCC)    Depression    Diabetes mellitus without complication (HCC)    Dyspnea    GERD (gastroesophageal reflux disease)    Glaucoma    Headache    History of kidney stones    Hyperlipidemia    Hypertension    MI (myocardial infarction) (HCC)    unsure when   Neuropathy    Rotator cuff injury    Seasonal allergies    Stroke [facility])    TIA's    Past Surgical History:  Procedure Laterality Date   BRAIN SURGERY Right 2010   anuerysm repair   CARPAL TUNNEL RELEASE Right 06/12/2017   Procedure: CARPAL TUNNEL RELEASE;  Surgeon: Juanell Fairly, MD;  Location: [facility] ORS;  Service: Orthopedics;  Laterality: Right;   CHOLECYSTECTOMY     COLONOSCOPY WITH PROPOFOL N/A 08/09/2021   Procedure: COLONOSCOPY WITH PROPOFOL;  Surgeon: Regis Bill, MD;  Location: [facility] ENDOS[facility];  Service: Endoscopy;  Laterality: N/A;    ESOPHAGOGASTRODUODENOSCOPY (EGD) WITH PROPOFOL N/A 08/09/2021   Procedure: ESOPHAGOGASTRODUODENOSCOPY (EGD) WITH PROPOFOL;  Surgeon: Regis Bill, MD;  Location: [facility] ENDOS[facility];  Service: Endoscopy;  Laterality: N/A;   JOINT REPLACEMENT Bilateral    TOTAL KNEE REPLACEMENT   SHOULDER ARTHROSCOPY WITH OPEN ROTATOR CUFF REPAIR Left 11/23/2016   Procedure: SHOULDER ARTHROSCOPY WITH OPEN ROTATOR CUFF REPAIR, ARTHROSCOPIC SUBACROMIAL DECOMPRESSION, DISTAL CLAVICLE EXCISION;  Surgeon: Juanell Fairly, MD;  Location: [facility] ORS;  Service: Orthopedics;  Laterality: Left;   SHOULDER ARTHROSCOPY WITH OPEN ROTATOR CUFF REPAIR Right 07/30/2018   Procedure: SHOULDER ARTHROSCOPY WITH OPEN ROTATOR CUFF REPAIR,SUBACROMINAL DECOMPRESSION AND DISTAL CLAVICLE EXCISION;  Surgeon: Juanell Fairly, MD;  Location: [facility] ORS;  Service: Orthopedics;  Laterality: Right;   TOTAL KNEE REVISION Right 05/05/2020   Procedure: right total knee arthroplasty revision;  Surgeon: Lyndle Herrlich, MD;  Location: [facility] ORS;  Service: Orthopedics;  Laterality: Right;    There were no vitals filed for this visit.   Subjective Assessment - 08/18/21 1104     Subjective Patient reports doing okay- States her right knee is okay so far today. Reports she has been compliant with her LE strengthening home program.    Pertinent History OA knees (had Left TKA in 2014 and right in 2016  and most recent revision right TKA on 05/05/2020)  and back,  OSA, HTN, morbid obesity (BMI 50.0-59.9) , hx anxiety and depression, hx cereberal aneurism,  glaucoma, hx MI, neuropathy, B rotator cuff repairs, Hx TIAs. FOLLOW LYMPHEDREMA PRECAUTIONS FOR diastolic dysfunction, asthma, CKD, dyspnea, CHF, COPD DM    Limitations Sitting;Lifting;Standing;Walking;House hold activities    How long can you sit comfortably? varies on pain that day- it becomes stiff    How long can you stand comfortably? Not long <    How long can you walk comfortably? <5 min     Diagnostic tests None available    Patient Stated Goals To have decrease right knee pain and be able to drive and do more for myself and walk better.    Currently in Pain? No/denies    Pain Onset --   RTK 2016. S/P revision 2021. Chronic pain started ~ 2 yrs before revision and persists today           INTERVENTIONS:   Pre-treatment: Right knee AROM flex=97 deg   Manual Therapy: Manual stretching and PROM to right knee in seated position. Grade II-III tibiofemoral A/P joint glides and P/A joint glides in varying increasing knee flex ROM.   Post treatment Right knee AROM flex=102 deg  Therapeutic Exercises:   Instructed patient in seated knee flex and then knee ext stretching- Holding for 20 sec x 4 sets each. Patient required VC, Visual demo and then handout for correct technique- She presented with good understanding and able to perform well without report of pain.   Instructed in the following LE strengthening for HEP:   Seated ham curls with blue theraband 2 x 10 reps.  Seated knee ext with 2.5 lb ankle weights and 2 sec hold (2 sets of 10 reps) Seated heel raises with 2.5 lb (2 sets of 10 reps)  Seated toe raises with 2.5 lb (2 sets of 10 reps )   Education provided throughout session via VC/TC and demonstration to facilitate movement at target joints and correct muscle activation for all exercises performed.   Access Code: [email or web address] URL: [email or web address] Date: 08/18/2021 Prepared by: Maureen Ralphs  Exercises Seated Knee Flexion Stretch - 1 x daily - 7 x weekly - 4 sets - 20 sec hold Seated Hamstring Stretch - 1 x daily - 7 x weekly - 4 sets - 1 reps - 20 sec hold Seated Hamstring Curl with Anchored Resistance - 1 x daily - 3 x weekly - 3 sets - 10 reps - 3 sec hold Seated Long Arc Quad - 1 x daily - 3 x weekly - 3 sets - 10 reps - 3 sec hold Seated Heel Toe Raises - 1 x daily - 3 x weekly - 3 sets - 10 reps - 3 hold Seated Heel Raise - 1 x daily -  3 x weekly - 3 sets - 10 reps - 3 hold  Clinical Impression: Patient responded well to manual techniques and presented with improved overall right knee ROM. She also responded well to therapeutic exercises today - responsive to all VC and visual demonstration for correct technique with added HEP today. Patient was well motivation and reports encouraged by her improvement with ROM.  Patient would benefit from additional skilled PT intervention to improve strength, balance and mobility                      PT Education - 08/18/21 1106     Education Details Exercise technique/HEP  Person(s) Educated Patient    Methods Explanation;Demonstration;Tactile cues;Verbal cues    Comprehension Verbalized understanding;Returned demonstration;Verbal cues required;Need further instruction              PT Short Term Goals - 07/13/21 1736       PT SHORT TERM GOAL #1   Title Pt will be independent with HEP in order to decrease ankle pain and increase strength in order to improve pain-free function at home and work.    Baseline 07/13/2021- Patient has no formal HEP in place and currently pain limited and not exercising.    Time 6    Period Weeks    Status New    Target Date 08/24/21               PT Long Term Goals - 07/13/21 1737       PT LONG TERM GOAL #1   Title Pt will improve FOTO to target score of 41  to display perceived improvements in ability to complete ADL's.    Baseline 8/31/022= 21    Time 12    Period Weeks    Status New    Target Date 10/05/21      PT LONG TERM GOAL #2   Title Pt will decrease worst pain as reported on NPRS by at least 3 points (initially 8/10 Right knee)  in order to demonstrate clinically significant reduction in ankle/foot pain.    Baseline 07/13/2021= 8/10    Time 12    Period Weeks    Status New    Target Date 10/05/21      PT LONG TERM GOAL #3   Title Pt will increase Right knee strength of  by at least 1/2 MMT grade in  order to demonstrate improvement in strength and function    Baseline 07/13/2021= Right knee flex/ext = 2-/5 *pain limited    Time 12    Status New    Target Date 10/05/21      PT LONG TERM GOAL #4   Title Pt will decrease 5TSTS by at least 8 seconds in order to demonstrate clinically significant improvement in LE strength.    Baseline 07/13/2021=34.77 without UE support    Time 12    Period Weeks    Status New    Target Date 10/05/21      PT LONG TERM GOAL #5   Title Patient will present with improved right Knee flex > 105 deg AROM for improved ROM with all functional activities.    Baseline 07/13/2021= Right knee AROM = 95 deg    Time 12    Period Weeks    Status New    Target Date 10/05/21      Additional Long Term Goals   Additional Long Term Goals Yes      PT LONG TERM GOAL #6   Title Pt will decrease TUG to below 31 seconds/decrease in order to demonstrate decreased fall risk.    Baseline 07/13/2021= 54.22 sec using 4WW    Time 12    Period Weeks    Status New    Target Date 10/05/21      PT LONG TERM GOAL #7   Title Pt will increase by at least 0.2 m/s in order to demonstrate clinically significant improvement in community ambulation.    Baseline 07/13/2021= 0.32 m/s using 4WW    Time 12    Period Weeks    Status New    Target Date 10/05/21  Plan - 08/18/21 1108     Clinical Impression Statement Patient responded well to manual techniques and presented with improved overall right knee ROM. She also responded well to therapeutic exercises today - responsive to all VC and visual demonstration for correct technique with added HEP today. Patient was well motivation and reports encouraged by her improvement with ROM.  Patient would benefit from additional skilled PT intervention to improve strength, balance and mobility    Personal Factors and Comorbidities Comorbidity 3+    Comorbidities HTN, Diabetes, Lymphedema    Examination-Activity  Limitations Bathing;Bed Mobility;Bend;Caring for Others;Carry;Dressing;Lift;Sleep;Squat;Stairs;Stand;Transfers    Examination-Participation Restrictions Cleaning;Community Activity;Driving;Laundry;Meal Prep    Stability/Clinical Decision Making Stable/Uncomplicated    Rehab Potential Good    PT Frequency 2x / week    PT Duration 12 weeks    PT Treatment/Interventions ADLs/Self Care Home Management;Cryotherapy;Moist Heat;DME Instruction;Gait training;Stair training;Functional mobility training;Therapeutic activities;Therapeutic exercise;Balance training;Neuromuscular re-education;Patient/family education;Orthotic Fit/Training;Manual techniques;Compression bandaging;Scar mobilization;Passive range of motion;Dry needling;Taping    PT Next Visit Plan Instruct patient in ROM, Strengthening and intiate home program exercises.    PT Home Exercise Plan Access Code: [email or web address]    Consulted and Agree with Plan of Care Patient             Patient will benefit from skilled therapeutic intervention in order to improve the following deficits and impairments:  Abnormal gait, Decreased balance, Decreased activity tolerance, Decreased endurance, Decreased mobility, Decreased range of motion, Decreased skin integrity, Decreased strength, Difficulty walking, Hypomobility, Impaired flexibility, Obesity, Pain  Visit Diagnosis: Abnormality of gait and mobility  Difficulty in walking, not elsewhere classified  Muscle weakness (generalized)  Unsteadiness on feet     Problem List Patient Active Problem List   Diagnosis Date Noted   Chest pain 02/05/2021   SOB (shortness of breath) 02/05/2021   Leg swelling 02/05/2021   Nonrheumatic mitral valve regurgitation 02/05/2021   Morbid obesity (HCC) 02/05/2021   S/P revision of total knee, right 05/05/2020   S/P right rotator cuff repair 07/30/2018   S/P left rotator cuff repair 11/23/2016   Benign essential hypertension 02/05/2003    Lenda Kelp,  PT 08/18/2021, 4:55 PM  [facility] [facility] MAIN [facility] SERVICES [address] [place], [place], [address] Phone: [phone]   Fax:  [phone]  Name: WINSLOW VERRILL MRN: [id number] Date of Birth: 09/13/1956

## 2021-08-18 NOTE — Patient Instructions (Signed)

## 2021-08-23 ENCOUNTER — Ambulatory Visit: Payer: Medicare HMO

## 2021-08-23 ENCOUNTER — Other Ambulatory Visit: Payer: Self-pay

## 2021-08-23 ENCOUNTER — Ambulatory Visit: Payer: Medicare HMO | Admitting: Occupational Therapy

## 2021-08-23 MED ORDER — VALSARTAN 40 MG PO TABS: 40.0000 mg | ORAL_TABLET | Freq: Every day | ORAL | 2 refills | Status: DC

## 2021-08-25 ENCOUNTER — Other Ambulatory Visit: Payer: Self-pay

## 2021-08-25 ENCOUNTER — Ambulatory Visit: Payer: Medicare HMO

## 2021-08-25 ENCOUNTER — Ambulatory Visit: Payer: Medicare HMO | Admitting: Occupational Therapy

## 2021-08-25 DIAGNOSIS — I890 Lymphedema, not elsewhere classified: Secondary | ICD-10-CM | POA: Diagnosis not present

## 2021-08-25 NOTE — Therapy (Signed)
[place] MAIN [facility] SERVICES [address] [place], [place], [address] Phone: [phone]   Fax:  [phone]  Occupational Therapy Treatment  Patient Details  Name: Kristine Rangel MRN: [id number] Date of Birth: [birth year] Referring Provider (OT): Arvil Chaco, PA-C   Encounter Date: 08/25/2021   OT End of Session - 08/25/21 1020     Visit Number 19    Number of Visits 36    Date for OT Re-Evaluation 11/14/21    Authorization Time Period 06/25/21 - 10/ /22    OT Start Time 1005    Equipment Utilized During Treatment --    Activity Tolerance Patient tolerated treatment well;No increased pain    Behavior During Therapy WFL for tasks assessed/performed             Past Medical History:  Diagnosis Date   Anxiety    Arthritis    Asthma    Bladder leak    Cerebral aneurysm    history-has coils   CHF (congestive heart failure) (HCC)    Chronic kidney disease    COPD (chronic obstructive pulmonary disease) (HCC)    Depression    Diabetes mellitus without complication (HCC)    Dyspnea    GERD (gastroesophageal reflux disease)    Glaucoma    Headache    History of kidney stones    Hyperlipidemia    Hypertension    MI (myocardial infarction) ([place])    unsure when   Neuropathy    Rotator cuff injury    Seasonal allergies    Stroke [facility])    TIA's    Past Surgical History:  Procedure Laterality Date   BRAIN SURGERY Right 2010   anuerysm repair   CARPAL TUNNEL RELEASE Right 06/12/2017   Procedure: CARPAL TUNNEL RELEASE;  Surgeon: Thornton Park, MD;  Location: [facility] ORS;  Service: Orthopedics;  Laterality: Right;   CHOLECYSTECTOMY     COLONOSCOPY WITH PROPOFOL N/A 08/09/2021   Procedure: COLONOSCOPY WITH PROPOFOL;  Surgeon: Lesly Rubenstein, MD;  Location: [facility];  Service: Endoscopy;  Laterality: N/A;   ESOPHAGOGASTRODUODENOSCOPY (EGD) WITH PROPOFOL N/A 08/09/2021   Procedure: ESOPHAGOGASTRODUODENOSCOPY (EGD)  WITH PROPOFOL;  Surgeon: Lesly Rubenstein, MD;  Location: [facility];  Service: Endoscopy;  Laterality: N/A;   JOINT REPLACEMENT Bilateral    TOTAL KNEE REPLACEMENT   SHOULDER ARTHROSCOPY WITH OPEN ROTATOR CUFF REPAIR Left 11/23/2016   Procedure: SHOULDER ARTHROSCOPY WITH OPEN ROTATOR CUFF REPAIR, ARTHROSCOPIC SUBACROMIAL DECOMPRESSION, DISTAL CLAVICLE EXCISION;  Surgeon: Thornton Park, MD;  Location: [facility] ORS;  Service: Orthopedics;  Laterality: Left;   SHOULDER ARTHROSCOPY WITH OPEN ROTATOR CUFF REPAIR Right 07/30/2018   Procedure: SHOULDER ARTHROSCOPY WITH OPEN ROTATOR CUFF REPAIR,SUBACROMINAL DECOMPRESSION AND DISTAL CLAVICLE EXCISION;  Surgeon: Thornton Park, MD;  Location: [facility];  Service: Orthopedics;  Laterality: Right;   TOTAL KNEE REVISION Right 05/05/2020   Procedure: right total knee arthroplasty revision;  Surgeon: Lovell Sheehan, MD;  Location: [facility] ORS;  Service: Orthopedics;  Laterality: Right;    There were no vitals filed for this visit.   Subjective Assessment - 08/25/21 1030     Subjective  [name] presents for OT Rx visit 19/24  to address bilateral lower extremity lipo-lymphedema. Pt reports L hip pain 10/10, but denies LE related pain this morning. Pt is un accompanied  by paid caregiver, Georgina Quint.   She presents with compression wraps in place on the LLE and custom compression garment  on the RLE.Pt and OT discussed  upcoming progress report and reviewed treatment goals.    Pertinent History OA knees and back,  OSA, HTN, morbid obesity (BMI 50.0-59.9) , hx anxiety and depression, hx cereberal aneurism,  glaucoma, hx MI, neuropathy, B rotator cuff repairs, Hx TIAs. FOLLOW LYMPHEDREMA PRECAUTIONS FOR diastolic dysfunction, asthma, CKD, dyspnea, CHF, COPD DM    Limitations difficulty walking, impaired standing balance, dyspnea, decreased dynamic balance, decreased LB strength, decreased A/PROM 2/2 body habitus and skin approximation,  increased supportive care  needs and decreased quality of life, impaired body image    Repetition Increases Symptoms    Special Tests Intake FOTO score 05/05/21: 12/100. 9th visit FOTO 06/20/21:: 12/100. 19th visit FOTO 08/25/21: 12/100.    Patient Stated Goals Get the swelling down in my legs    Currently in Pain? Yes    Pain Score 10-Worst pain ever    Pain Location Hip    Pain Orientation Left    Pain Descriptors / Indicators Aching    Pain Type Chronic pain    Pain Onset More than a month ago   RTK 2016. S/P revision 2021. Chronic pain started ~ 2 yrs before revision and persists today                         OT Treatments/Exercises (OP) - 08/25/21 1250       ADLs   ADL Education Given Yes      Manual Therapy   Manual Therapy Edema management;Manual Lymphatic Drainage (MLD);Compression Bandaging    Manual therapy comments skin care to LLE wit MLD    Manual Lymphatic Drainage (MLD) to RLE/RLQ as established    Compression Bandaging Compression wraps switched to LLE today utilizing same gradient techniques after sucessfully fitting RLE daytime stocking and HOS device. 3 layer gradiant compression wraps  from base of toes to tibial tuberosity using 10 and 12 " wide short stretch bandages over 0.4 cm thick, single layer of Rosidal fam over cotton stockinett.                    OT Education - 08/25/21 1251     Education Details Reviewed l;ymphedema precautions and cellulitis signs and symptoms. Pt achieved LE self-care goal for LE precautions and prevention.    Person(s) Educated Patient;Caregiver(s)    Methods Explanation;Demonstration;Handout    Comprehension Verbalized understanding;Returned demonstration;Need further instruction                 OT Long Term Goals - 08/25/21 1021       OT LONG TERM GOAL #1   Title Patient's intake functional score on the FOTO tool is 12 out of 100 (higher number = greater function). Given this patient's comorbidities she will experience  at least an increase in function of 6 points, or higher, to increase level of independence with basic ADLs and functional ambulation and mobility.    Baseline 12/100    Time 12    Period Weeks    Status On-going   9th visit 06/20/21 score 12/100. Functional score unchanged frm intake. No measurable progress towards this goal to date.   Target Date 11/23/21      OT LONG TERM GOAL #2   Title Pt will be able to apply multi-layer, short stretch compression wraps to one leg at a time using correct gradient techniques with maximum caregiver assistance in an effort  to return the affected  limb(s)  to premorbid size and shape, to limit pain and  infection risk, and to improve functional mobility and ambulation  for ADLs.    Baseline dependent    Time 4    Period Days    Status Achieved   paid caregiver x 2 able to correctly apply wraps.     OT LONG TERM GOAL #3   Title Pt verbalize  4 primary lymphedema  precautions and prevention strategies using printed reference (modified independence) to reduce infection risk limit progression of lymphedema.    Baseline Max A    Time 4    Period Days    Status Achieved      OT LONG TERM GOAL #4   Title Pt will achieve at least 10% limb volume reduction bilaterally during Intensive Phase CDT to prevent re-accumulation of lymphatic congestion and progression of fibrosis, to limit infection risk, to improve functional ambulation and transfers, and to improve functional performance of ADLs.    Baseline dependent    Time 12    Period Weeks    Status Partially Met   RLE dec 9.84%. RLE GOAL MET   Target Date 11/23/21      OT LONG TERM GOAL #5   Title Pt able to donn and doff custom daytime compression garments and HOS devices with Max CG assistance to prevent lymphatic reaccumulation, to limit fibrosis formation leading to progression.    Baseline Dependent    Time 12    Period Weeks    Status Achieved                   Plan - 08/25/21 1257      Clinical Impression Statement Provided moist heat pack to L hip throughout session to reduce hip pain and relax Pt  for improved lymphatic function during MLD. Pt tolerated LLE/ LLQ MLD without increased pain. Performed skin care simultaneously to ensure optimal hydration and to limit infection risk. Re-appled multi layer short stretch compression wraps from ankle to tibial tuberosity. Reviewed progress towards all goals in prep for upcming progress report. Reviewed lymphedema precautions and cellulitis signs/ symptoms. Pt achied self care goal for precautions today. Cont as per POC.    OT Occupational Profile and History Comprehensive Assessment- Review of records and extensive additional review of physical, cognitive, psychosocial history related to current functional performance    Occupational performance deficits (Please refer to evaluation for details): ADL's;IADL's;Rest and Sleep;Work;Leisure;Social Participation    Body Structure / Function / Physical Skills ADL;Edema;Skin integrity;Mobility;Pain;Decreased knowledge of precautions;Decreased knowledge of use of DME;IADL;Balance;Endurance;Strength;UE functional use;Obesity;Vision;Gait;ROM;Sensation    Rehab Potential Fair    Clinical Decision Making Several treatment options, min-mod task modification necessary    Comorbidities Affecting Occupational Performance: Presence of comorbidities impacting occupational performance    Comorbidities impacting occupational performance description: Suspected lipedema. Possible CVI. Comorbidities that contribute to systemic fluid retention and lymphatic overload  include CKD, CHF, HTN, OSA, morbid obesity, chronic joint inflammation    Modification or Assistance to Complete Evaluation  Min-Moderate modification of tasks or assist with assess necessary to complete eval    OT Frequency 2x / week    OT Duration 12 weeks   and PRN.   OT Treatment/Interventions Self-care/ADL training;Therapeutic exercise;Manual  Therapy;Energy conservation;Manual lymph drainage;Therapeutic activities;DME and/or AE instruction;Compression bandaging;Other (comment);Patient/family education   Fit with custom daytime compression garments and HOS devices designed to limit lympphedema progression and facilitate increased lymphatic circulation  during hours of sleep   Plan Intensive and Self-Management Complete Decongestive Therapy (CDT): Treat one leg at a time only to limit  excessive fluid return to the heart and to limit falls risk. Provide Manual lymphatic Drainage (MLD), skin care with low ph lotion/ oil to increase skin hydrarion and mobility, therapeutic lymphatic pumping ther ex and multilayer gradient compression wraps to reduce limb volume. Lastly, fit appropriate compression garments to retain clinic al gains.    Recommended Other Services If cardiology is in agreement, fit with advanced sequential pneumatic compression device, or Flexitouch to decongest lymphatic fluid build up in fatty tissues under the skin.This device wil enable Pt to manage lipo-lymphedema at home over time with fluid decongestion and pain relief. OT to arrange trial with manufacturer's rep is in agreement.    Consulted and Agree with Plan of Care Patient             Patient will benefit from skilled therapeutic intervention in order to improve the following deficits and impairments:   Body Structure / Function / Physical Skills: ADL, Edema, Skin integrity, Mobility, Pain, Decreased knowledge of precautions, Decreased knowledge of use of DME, IADL, Balance, Endurance, Strength, UE functional use, Obesity, Vision, Gait, ROM, Sensation       Visit Diagnosis: Lymphedema, not elsewhere classified - Plan: Ot plan of care cert/re-cert    Problem List Patient Active Problem List   Diagnosis Date Noted   Chest pain 02/05/2021   SOB (shortness of breath) 02/05/2021   Leg swelling 02/05/2021   Nonrheumatic mitral valve regurgitation 02/05/2021    Morbid obesity ([place]) 02/05/2021   S/P revision of total knee, right 05/05/2020   S/P right rotator cuff repair 07/30/2018   S/P left rotator cuff repair 11/23/2016   Benign essential hypertension 02/05/2003   Andrey Spearman, MS, OTR/L, [facility] 08/25/21 1:02 PM   [place] MAIN [facility] SERVICES [address] [place], [place], [address] Phone: [phone]   Fax:  [phone]  Name: CARLYLE ACHENBACH MRN: [id number] Date of Birth: May 08, 1956

## 2021-08-25 NOTE — Patient Instructions (Signed)

## 2021-08-30 ENCOUNTER — Ambulatory Visit: Payer: Medicare HMO

## 2021-08-30 ENCOUNTER — Ambulatory Visit: Payer: Medicare HMO | Admitting: Occupational Therapy

## 2021-09-01 ENCOUNTER — Other Ambulatory Visit: Payer: Self-pay

## 2021-09-01 ENCOUNTER — Ambulatory Visit: Payer: Medicare HMO | Admitting: Occupational Therapy

## 2021-09-01 ENCOUNTER — Ambulatory Visit: Payer: Medicare HMO

## 2021-09-01 DIAGNOSIS — I890 Lymphedema, not elsewhere classified: Secondary | ICD-10-CM | POA: Diagnosis not present

## 2021-09-01 NOTE — Patient Instructions (Signed)

## 2021-09-01 NOTE — Therapy (Signed)
[place] MAIN [facility] SERVICES [address] [place], [place], [address] Phone: [phone]   Fax:  [phone]  Occupational Therapy Treatment Note and Progress Report: Lymphedema Care Reporting Period: 06/29/21 - 09/01/2021  Patient Details  Name: Kristine Rangel MRN: [id number] Date of Birth: 08-27-1956 Referring Provider (OT): Arvil Chaco, PA-C   Encounter Date: 09/01/2021   OT End of Session - 09/01/21 1021     Visit Number 20    Number of Visits 36    Date for OT Re-Evaluation 11/14/21    Authorization Time Period 06/25/21 - 10/ /22    OT Start Time 1005    OT Stop Time 1105    OT Time Calculation (min) 60 min    Activity Tolerance Patient tolerated treatment well;No increased pain    Behavior During Therapy WFL for tasks assessed/performed             Past Medical History:  Diagnosis Date   Anxiety    Arthritis    Asthma    Bladder leak    Cerebral aneurysm    history-has coils   CHF (congestive heart failure) (HCC)    Chronic kidney disease    COPD (chronic obstructive pulmonary disease) (HCC)    Depression    Diabetes mellitus without complication (HCC)    Dyspnea    GERD (gastroesophageal reflux disease)    Glaucoma    Headache    History of kidney stones    Hyperlipidemia    Hypertension    MI (myocardial infarction) ([place])    unsure when   Neuropathy    Rotator cuff injury    Seasonal allergies    Stroke [facility])    TIA's    Past Surgical History:  Procedure Laterality Date   BRAIN SURGERY Right 2010   anuerysm repair   CARPAL TUNNEL RELEASE Right 06/12/2017   Procedure: CARPAL TUNNEL RELEASE;  Surgeon: Thornton Park, MD;  Location: [facility] ORS;  Service: Orthopedics;  Laterality: Right;   CHOLECYSTECTOMY     COLONOSCOPY WITH PROPOFOL N/A 08/09/2021   Procedure: COLONOSCOPY WITH PROPOFOL;  Surgeon: Lesly Rubenstein, MD;  Location: [facility] ENDOSCOPY;  Service: Endoscopy;  Laterality: N/A;    ESOPHAGOGASTRODUODENOSCOPY (EGD) WITH PROPOFOL N/A 08/09/2021   Procedure: ESOPHAGOGASTRODUODENOSCOPY (EGD) WITH PROPOFOL;  Surgeon: Lesly Rubenstein, MD;  Location: [facility] ENDOSCOPY;  Service: Endoscopy;  Laterality: N/A;   JOINT REPLACEMENT Bilateral    TOTAL KNEE REPLACEMENT   SHOULDER ARTHROSCOPY WITH OPEN ROTATOR CUFF REPAIR Left 11/23/2016   Procedure: SHOULDER ARTHROSCOPY WITH OPEN ROTATOR CUFF REPAIR, ARTHROSCOPIC SUBACROMIAL DECOMPRESSION, DISTAL CLAVICLE EXCISION;  Surgeon: Thornton Park, MD;  Location: [facility] ORS;  Service: Orthopedics;  Laterality: Left;   SHOULDER ARTHROSCOPY WITH OPEN ROTATOR CUFF REPAIR Right 07/30/2018   Procedure: SHOULDER ARTHROSCOPY WITH OPEN ROTATOR CUFF REPAIR,SUBACROMINAL DECOMPRESSION AND DISTAL CLAVICLE EXCISION;  Surgeon: Thornton Park, MD;  Location: [facility] ORS;  Service: Orthopedics;  Laterality: Right;   TOTAL KNEE REVISION Right 05/05/2020   Procedure: right total knee arthroplasty revision;  Surgeon: Lovell Sheehan, MD;  Location: [facility] ORS;  Service: Orthopedics;  Laterality: Right;    There were no vitals filed for this visit.   Subjective Assessment - 09/01/21 1316     Subjective  Kristine Rangel presents for OT Rx visit 20/24  to address bilateral lower extremity lipo-lymphedema. Pt reports L hip, pain back and knee pain are somewhat decreased since last session, but she does not rate pain numerically today.  Pt denies  LE related pain this morning. Pt is un accompanied  by paid caregiver, Kristine Rangel.   She presents with compression wraps in place on the LLE and custom compression garment  on the RLE.Pt and OT discussed progress to date  and reviewed treatment goals.    Pertinent History OA knees and back,  OSA, HTN, morbid obesity (BMI 50.0-59.9) , hx anxiety and depression, hx cereberal aneurism,  glaucoma, hx MI, neuropathy, B rotator cuff repairs, Hx TIAs. FOLLOW LYMPHEDREMA PRECAUTIONS FOR diastolic dysfunction, asthma, CKD, dyspnea, CHF, COPD DM     Limitations difficulty walking, impaired standing balance, dyspnea, decreased dynamic balance, decreased LB strength, decreased A/PROM 2/2 body habitus and skin approximation,  increased supportive care needs and decreased quality of life, impaired body image    Repetition Increases Symptoms    Special Tests Intake FOTO score 05/05/21: 12/100. 9th visit FOTO 06/20/21:: 12/100. 19th visit FOTO 08/25/21: 12/100.    Patient Stated Goals Get the swelling down in my legs    Pain Onset More than a month ago   [place] 2016. S/P revision 2021. Chronic pain started ~ 2 yrs before revision and persists today                         OT Treatments/Exercises (OP) - 09/01/21 1319       ADLs   ADL Education Given Yes      Manual Therapy   Manual Therapy Edema management;Manual Lymphatic Drainage (MLD);Compression Bandaging    Manual therapy comments skin care to LLE wit MLD    Manual Lymphatic Drainage (MLD) to RLE/RLQ as established    Compression Bandaging Compression wraps switched to LLE today utilizing same gradient techniques after sucessfully fitting RLE daytime stocking and HOS device. 3 layer gradiant compression wraps  from base of toes to tibial tuberosity using 10 and 12 " wide short stretch bandages over 0.4 cm thick, single layer of Rosidal fam over cotton stockinett.                    OT Education - 09/01/21 1319     Education Details Continued Pt/ CG edu for lymphedema self care  and home program throughout session. Topics include multilayer, gradient compression wrapping, simple self-MLD, therapeutic lymphatic pumping exercises, skin/nail care, risk reduction factors and LE precautions, compression garments/recommendations and wear and care schedule and compression garment donning / doffing using assistive devices. All questions answered to the Pt's satisfaction, and Pt demonstrates understanding by report.    Person(s) Educated Patient;Caregiver(s)    Methods  Explanation;Demonstration;Handout    Comprehension Verbalized understanding;Returned demonstration;Need further instruction                 OT Long Term Goals - 09/01/21 1314       OT LONG TERM GOAL #1   Title Patient's intake functional score on the FOTO tool is 12 out of 100 (higher number = greater function). Given this patient's comorbidities she will experience at least an increase in function of 6 points, or higher, to increase level of independence with basic ADLs and functional ambulation and mobility.    Baseline 12/100    Time 12    Period Weeks    Status On-going   9th visit 06/20/21 score 12/100. Functional score unchanged frm intake. No measurable progress towards this goal to date.   Target Date 11/23/21      OT LONG TERM GOAL #2   Title Pt will be able  to apply multi-layer, short stretch compression wraps to one leg at a time using correct gradient techniques with maximum caregiver assistance in an effort  to return the affected  limb(s)  to premorbid size and shape, to limit pain and infection risk, and to improve functional mobility and ambulation  for ADLs.    Baseline dependent    Time 4    Period Days    Status Achieved   paid caregiver x 2 able to correctly apply wraps.     OT LONG TERM GOAL #3   Title Pt verbalize  4 primary lymphedema  precautions and prevention strategies using printed reference (modified independence) to reduce infection risk limit progression of lymphedema.    Baseline Max A    Time 4    Period Days    Status Achieved      OT LONG TERM GOAL #4   Title Pt will achieve at least 10% limb volume reduction bilaterally during Intensive Phase CDT to prevent re-accumulation of lymphatic congestion and progression of fibrosis, to limit infection risk, to improve functional ambulation and transfers, and to improve functional performance of ADLs.    Baseline dependent    Time 12    Period Weeks    Status Partially Met   RLE dec 9.84%. RLE GOAL  MET. LLE ongoing. No measurements for 10/20    due to time constraints. TBA next visit   Target Date 11/23/21      OT LONG TERM GOAL #5   Title Pt able to donn and doff custom daytime compression garments and HOS devices with Max CG assistance to prevent lymphatic reaccumulation, to limit fibrosis formation leading to progression.    Baseline Dependent    Time 12    Period Weeks    Status Achieved                   Plan - 09/01/21 1313     Clinical Impression Statement Pt demonstrates progress towards all OT goals for lymphedema care. She continues to work on increased FOTO score goal and volume reduction score for the LLE. She has met the LLE volume reduction goal with 7.4% reduction  below the knee, 11.1% reduction in thigh and overall limb volume reduction measuring 9.84%. We'll measure limb volumetrics next visit to assess LLE reduction today. Please review LONG TERM GOALS section for additional details.Pt tolerated LLE/ LLQ MLD without increased pain. Performed skin care simultaneously to ensure optimal hydration and to limit infection risk. Re-appled multi layer short stretch compression wraps from ankle to tibial tuberosity.  Pt agrees with plan to reduce OT Rx frequency from 2 x weekly to 1 x weekly. She has achieved a progress plateau and is awaiting cfitting of custom LLE compression garment. Cont as per POC.    OT Occupational Profile and History Comprehensive Assessment- Review of records and extensive additional review of physical, cognitive, psychosocial history related to current functional performance    Occupational performance deficits (Please refer to evaluation for details): ADL's;IADL's;Rest and Sleep;Work;Leisure;Social Participation    Body Structure / Function / Physical Skills ADL;Edema;Skin integrity;Mobility;Pain;Decreased knowledge of precautions;Decreased knowledge of use of DME;IADL;Balance;Endurance;Strength;UE functional use;Obesity;Vision;Gait;ROM;Sensation     Rehab Potential Fair    Clinical Decision Making Several treatment options, min-mod task modification necessary    Comorbidities Affecting Occupational Performance: Presence of comorbidities impacting occupational performance    Comorbidities impacting occupational performance description: Suspected lipedema. Possible CVI. Comorbidities that contribute to systemic fluid retention and lymphatic overload  include CKD,  CHF, HTN, OSA, morbid obesity, chronic joint inflammation    Modification or Assistance to Complete Evaluation  Min-Moderate modification of tasks or assist with assess necessary to complete eval    OT Frequency 1x / week    OT Duration 12 weeks   and PRN.   OT Treatment/Interventions Self-care/ADL training;Therapeutic exercise;Manual Therapy;Energy conservation;Manual lymph drainage;Therapeutic activities;DME and/or AE instruction;Compression bandaging;Other (comment);Patient/family education   Fit with custom daytime compression garments and HOS devices designed to limit lympphedema progression and facilitate increased lymphatic circulation  during hours of sleep   Plan Intensive and Self-Management Complete Decongestive Therapy (CDT): Treat one leg at a time only to limit excessive fluid return to the heart and to limit falls risk. Provide Manual lymphatic Drainage (MLD), skin care with low ph lotion/ oil to increase skin hydrarion and mobility, therapeutic lymphatic pumping ther ex and multilayer gradient compression wraps to reduce limb volume. Lastly, fit appropriate compression garments to retain clinic al gains.    Recommended Other Services If cardiology is in agreement, fit with advanced sequential pneumatic compression device, or Flexitouch to decongest lymphatic fluid build up in fatty tissues under the skin.This device wil enable Pt to manage lipo-lymphedema at home over time with fluid decongestion and pain relief. OT to arrange trial with manufacturer's rep is in agreement.     Consulted and Agree with Plan of Care Patient             Patient will benefit from skilled therapeutic intervention in order to improve the following deficits and impairments:   Body Structure / Function / Physical Skills: ADL, Edema, Skin integrity, Mobility, Pain, Decreased knowledge of precautions, Decreased knowledge of use of DME, IADL, Balance, Endurance, Strength, UE functional use, Obesity, Vision, Gait, ROM, Sensation       Visit Diagnosis: Lymphedema, not elsewhere classified    Problem List Patient Active Problem List   Diagnosis Date Noted   Chest pain 02/05/2021   SOB (shortness of breath) 02/05/2021   Leg swelling 02/05/2021   Nonrheumatic mitral valve regurgitation 02/05/2021   Morbid obesity ([place]) 02/05/2021   S/P revision of total knee, right 05/05/2020   S/P right rotator cuff repair 07/30/2018   S/P left rotator cuff repair 11/23/2016   Benign essential hypertension 02/05/2003   Andrey Spearman, Kristine, OTR/L, CLT-LANA 09/01/21 1:30 PM    [place] MAIN [facility] SERVICES [address] [place], [place], [address] Phone: [phone]   Fax:  [phone]  Name: Kristine Rangel MRN: [id number] Date of Birth: 01-14-1956

## 2021-09-06 ENCOUNTER — Encounter: Payer: Medicare HMO | Admitting: Occupational Therapy

## 2021-09-06 ENCOUNTER — Ambulatory Visit: Payer: Medicare HMO

## 2021-09-08 ENCOUNTER — Encounter: Payer: Medicare HMO | Admitting: Occupational Therapy

## 2021-09-08 ENCOUNTER — Ambulatory Visit: Payer: Medicare HMO

## 2021-09-19 ENCOUNTER — Encounter: Payer: Medicare HMO | Admitting: Occupational Therapy

## 2021-09-21 ENCOUNTER — Ambulatory Visit: Payer: Medicare HMO | Attending: Physician Assistant | Admitting: Occupational Therapy

## 2021-09-21 ENCOUNTER — Other Ambulatory Visit: Payer: Self-pay

## 2021-09-21 DIAGNOSIS — I890 Lymphedema, not elsewhere classified: Secondary | ICD-10-CM | POA: Insufficient documentation

## 2021-09-21 NOTE — Therapy (Signed)
[place] MAIN [facility] SERVICES [address] [place], [place], [address] Phone: [phone]   Fax:  [phone]  Occupational Therapy Treatment  Patient Details  Name: Kristine Rangel MRN: [id number] Date of Birth: [birth year] Referring Provider (OT): Arvil Chaco, PA-C   Encounter Date: 09/21/2021   OT End of Session - 09/21/21 0908     Visit Number 21    Number of Visits 36    Date for OT Re-Evaluation 11/14/21    Authorization Time Period 06/25/21 - 10/ /22    OT Start Time 0905    OT Stop Time 0950    OT Time Calculation (min) 45 min    Activity Tolerance Patient tolerated treatment well;No increased pain    Behavior During Therapy WFL for tasks assessed/performed             Past Medical History:  Diagnosis Date   Anxiety    Arthritis    Asthma    Bladder leak    Cerebral aneurysm    history-has coils   CHF (congestive heart failure) (HCC)    Chronic kidney disease    COPD (chronic obstructive pulmonary disease) (HCC)    Depression    Diabetes mellitus without complication (HCC)    Dyspnea    GERD (gastroesophageal reflux disease)    Glaucoma    Headache    History of kidney stones    Hyperlipidemia    Hypertension    MI (myocardial infarction) ([place])    unsure when   Neuropathy    Rotator cuff injury    Seasonal allergies    Stroke [facility])    TIA's    Past Surgical History:  Procedure Laterality Date   BRAIN SURGERY Right 2010   anuerysm repair   CARPAL TUNNEL RELEASE Right 06/12/2017   Procedure: CARPAL TUNNEL RELEASE;  Surgeon: Thornton Park, MD;  Location: [facility] ORS;  Service: Orthopedics;  Laterality: Right;   CHOLECYSTECTOMY     COLONOSCOPY WITH PROPOFOL N/A 08/09/2021   Procedure: COLONOSCOPY WITH PROPOFOL;  Surgeon: Lesly Rubenstein, MD;  Location: [facility] ENDOSCOPY;  Service: Endoscopy;  Laterality: N/A;   ESOPHAGOGASTRODUODENOSCOPY (EGD) WITH PROPOFOL N/A 08/09/2021   Procedure:  ESOPHAGOGASTRODUODENOSCOPY (EGD) WITH PROPOFOL;  Surgeon: Lesly Rubenstein, MD;  Location: [facility] ENDOSCOPY;  Service: Endoscopy;  Laterality: N/A;   JOINT REPLACEMENT Bilateral    TOTAL KNEE REPLACEMENT   SHOULDER ARTHROSCOPY WITH OPEN ROTATOR CUFF REPAIR Left 11/23/2016   Procedure: SHOULDER ARTHROSCOPY WITH OPEN ROTATOR CUFF REPAIR, ARTHROSCOPIC SUBACROMIAL DECOMPRESSION, DISTAL CLAVICLE EXCISION;  Surgeon: Thornton Park, MD;  Location: [facility] ORS;  Service: Orthopedics;  Laterality: Left;   SHOULDER ARTHROSCOPY WITH OPEN ROTATOR CUFF REPAIR Right 07/30/2018   Procedure: SHOULDER ARTHROSCOPY WITH OPEN ROTATOR CUFF REPAIR,SUBACROMINAL DECOMPRESSION AND DISTAL CLAVICLE EXCISION;  Surgeon: Thornton Park, MD;  Location: [facility] ORS;  Service: Orthopedics;  Laterality: Right;   TOTAL KNEE REVISION Right 05/05/2020   Procedure: right total knee arthroplasty revision;  Surgeon: Lovell Sheehan, MD;  Location: [facility] ORS;  Service: Orthopedics;  Laterality: Right;    There were no vitals filed for this visit.   Subjective Assessment - 09/21/21 1791     Subjective  Ms Montel presents for OT Rx visit 21/24  to address bilateral lower extremity lipo-lymphedema. Pt reports pain 5/10 in back and L hip. Pt denies pain related to lymphedema this morning. Pt presents with LLE compression wraps in place. Her LLE custom garments have been delivered and she is excited to  be fitted today and to be able to DC compression wraps.    Pertinent History OA knees and back,  OSA, HTN, morbid obesity (BMI 50.0-59.9) , hx anxiety and depression, hx cereberal aneurism,  glaucoma, hx MI, neuropathy, B rotator cuff repairs, Hx TIAs. FOLLOW LYMPHEDREMA PRECAUTIONS FOR diastolic dysfunction, asthma, CKD, dyspnea, CHF, COPD DM    Limitations difficulty walking, impaired standing balance, dyspnea, decreased dynamic balance, decreased LB strength, decreased A/PROM 2/2 body habitus and skin approximation,  increased supportive care needs  and decreased quality of life, impaired body image    Repetition Increases Symptoms    Special Tests Intake FOTO score 05/05/21: 12/100. 9th visit FOTO 06/20/21:: 12/100. 19th visit FOTO 08/25/21: 12/100.    Patient Stated Goals Get the swelling down in my legs    Pain Onset More than a month ago   [place] 2016. S/P revision 2021. Chronic pain started ~ 2 yrs before revision and persists today                         OT Treatments/Exercises (OP) - 09/21/21 0865       ADLs   ADL Education Given Yes      Manual Therapy   Manual Therapy Edema management;Manual Lymphatic Drainage (MLD);Compression Bandaging    Compression Bandaging Completed fitting for LLE custom compression knee highs and HOS device                    OT Education - 09/21/21 0957     Education Details Continued Pt/ CG edu for lymphedema self care  and home program throughout session. Topics include multilayer, gradient compression wrapping, simple self-MLD, therapeutic lymphatic pumping exercises, skin/nail care, risk reduction factors and LE precautions, compression garments/recommendations and wear and care schedule and compression garment donning / doffing using assistive devices. All questions answered to the Pt's satisfaction, and Pt demonstrates understanding by report.    Person(s) Educated Patient;Caregiver(s)    Methods Explanation;Demonstration;Handout    Comprehension Verbalized understanding;Returned demonstration;Need further instruction                 OT Long Term Goals - 09/21/21 0954       OT LONG TERM GOAL #1   Title Patient's intake functional score on the FOTO tool is 12 out of 100 (higher number = greater function). Given this patient's comorbidities she will experience at least an increase in function of 6 points, or higher, to increase level of independence with basic ADLs and functional ambulation and mobility.    Baseline 12/100    Time 12    Period Weeks    Status  On-going   9th visit 06/20/21 score 12/100. Functional score unchanged frm intake. No measurable progress towards this goal to date.   Target Date 11/23/21      OT LONG TERM GOAL #2   Title Pt will be able to apply multi-layer, short stretch compression wraps to one leg at a time using correct gradient techniques with maximum caregiver assistance in an effort  to return the affected  limb(s)  to premorbid size and shape, to limit pain and infection risk, and to improve functional mobility and ambulation  for ADLs.    Baseline dependent    Time 4    Period Days    Status Achieved   paid caregiver x 2 able to correctly apply wraps.     OT LONG TERM GOAL #3   Title Pt verbalize  4 primary lymphedema  precautions and prevention strategies using printed reference (modified independence) to reduce infection risk limit progression of lymphedema.    Baseline Max A    Time 4    Period Days    Status Achieved      OT LONG TERM GOAL #4   Title Pt will achieve at least 10% limb volume reduction bilaterally during Intensive Phase CDT to prevent re-accumulation of lymphatic congestion and progression of fibrosis, to limit infection risk, to improve functional ambulation and transfers, and to improve functional performance of ADLs.    Baseline dependent    Time 12    Period Weeks    Status Partially Met   RLE dec 9.84%. RLE GOAL MET. LLE ongoing. No measurements for 10/20    due to time constraints. TBA next visit   Target Date 11/23/21      OT LONG TERM GOAL #5   Title Pt able to donn and doff custom daytime compression garments and HOS devices with Max CG assistance to prevent lymphatic reaccumulation, to limit fibrosis formation leading to progression.    Baseline Dependent    Time 12    Period Weeks    Status Achieved                   Plan - 09/21/21 0955     Clinical Impression Statement Completed custom compression garment and device fitting for LLE. All garments fit well and  contain swelling. Pt educated for wear and care reginmes. Educated re "It Engineer, water to [employer] w/ keeping garment and devices in place. Pt agrees w plan to return to OT in 1 month and then for 6 month f/u. Cont as per POC.    OT Occupational Profile and History Comprehensive Assessment- Review of records and extensive additional review of physical, cognitive, psychosocial history related to current functional performance    Occupational performance deficits (Please refer to evaluation for details): ADL's;IADL's;Rest and Sleep;Work;Leisure;Social Participation    Body Structure / Function / Physical Skills ADL;Edema;Skin integrity;Mobility;Pain;Decreased knowledge of precautions;Decreased knowledge of use of DME;IADL;Balance;Endurance;Strength;UE functional use;Obesity;Vision;Gait;ROM;Sensation    Rehab Potential Fair    Clinical Decision Making Several treatment options, min-mod task modification necessary    Comorbidities Affecting Occupational Performance: Presence of comorbidities impacting occupational performance    Comorbidities impacting occupational performance description: Suspected lipedema. Possible CVI. Comorbidities that contribute to systemic fluid retention and lymphatic overload  include CKD, CHF, HTN, OSA, morbid obesity, chronic joint inflammation    Modification or Assistance to Complete Evaluation  Min-Moderate modification of tasks or assist with assess necessary to complete eval    OT Frequency 1x / week    OT Duration 12 weeks   and PRN.   OT Treatment/Interventions Self-care/ADL training;Therapeutic exercise;Manual Therapy;Energy conservation;Manual lymph drainage;Therapeutic activities;DME and/or AE instruction;Compression bandaging;Other (comment);Patient/family education   Fit with custom daytime compression garments and HOS devices designed to limit lympphedema progression and facilitate increased lymphatic circulation  during hours of sleep   Plan Intensive and  Self-Management Complete Decongestive Therapy (CDT): Treat one leg at a time only to limit excessive fluid return to the heart and to limit falls risk. Provide Manual lymphatic Drainage (MLD), skin care with low ph lotion/ oil to increase skin hydrarion and mobility, therapeutic lymphatic pumping ther ex and multilayer gradient compression wraps to reduce limb volume. Lastly, fit appropriate compression garments to retain clinic al gains.    Recommended Other Services If cardiology is in agreement, fit with advanced sequential pneumatic  compression device, or Flexitouch to decongest lymphatic fluid build up in fatty tissues under the skin.This device wil enable Pt to manage lipo-lymphedema at home over time with fluid decongestion and pain relief. OT to arrange trial with manufacturer's rep is in agreement.    Consulted and Agree with Plan of Care Patient             Patient will benefit from skilled therapeutic intervention in order to improve the following deficits and impairments:   Body Structure / Function / Physical Skills: ADL, Edema, Skin integrity, Mobility, Pain, Decreased knowledge of precautions, Decreased knowledge of use of DME, IADL, Balance, Endurance, Strength, UE functional use, Obesity, Vision, Gait, ROM, Sensation       Visit Diagnosis: Lymphedema, not elsewhere classified    Problem List Patient Active Problem List   Diagnosis Date Noted   Chest pain 02/05/2021   SOB (shortness of breath) 02/05/2021   Leg swelling 02/05/2021   Nonrheumatic mitral valve regurgitation 02/05/2021   Morbid obesity ([place]) 02/05/2021   S/P revision of total knee, right 05/05/2020   S/P right rotator cuff repair 07/30/2018   S/P left rotator cuff repair 11/23/2016   Benign essential hypertension 02/05/2003    Andrey Spearman, MS, OTR/L, [facility] 09/21/21 9:59 AM   [place] MAIN [facility] SERVICES [address] [place], [place], [address] Phone:  [phone]   Fax:  [phone]  Name: THANIA WOODLIEF MRN: [id number] Date of Birth: 1956/05/10

## 2021-09-25 NOTE — Progress Notes (Signed)
Virtual Visit via Video Note   This visit type was conducted due to national recommendations for restrictions regarding the COVID-19 Pandemic (e.g. social distancing) in an effort to limit this patient's exposure and mitigate transmission in our community.  Due to her co-morbid illnesses, this patient is at least at moderate risk for complications without adequate follow up.  This format is felt to be most appropriate for this patient at this time.  All issues noted in this document were discussed and addressed.  A limited physical exam was performed with this format.  Please refer to the patient's chart for her consent to telehealth for [facility].   Date:  09/26/2021   ID:  Kristine Rangel, DOB 1956/11/09, MRN [id number] The patient was identified using 2 identifiers.  Patient Location: Home Provider Location: Home Office   PCP:  Leanna Sato, MD   [facility] Providers Cardiologist:  Yvonne Kendall, MD     Evaluation Performed:  Follow-Up Visit  Chief Complaint:  OSA  History of Present Illness:    Kristine Rangel is a 65 y.o. female with CHF, CKD, DM, COPD, HTN, CVA who was referred for evaluation of OSA.  She underwent home sleep study showing mild OSA with an AHI of 9.3/hr, no central apnea and no significant hypoxemia.  She was started on auto CPAP and is now here for followup.   She is doing well with her CPAP device and thinks that she has gotten used to it.  She tolerates the mask and feels the pressure is adequate.  Since going on CPAP she feels rested in the am and has no significant daytime sleepiness but occasionally will have to take a nap if she takes her pain medicine.  She denies any significant mouth or nasal dryness or nasal congestion.  She does not think that she snores.     The patient does not have symptoms concerning for COVID-19 infection (fever, chills, cough, or new shortness of breath).     Past Medical History:  Diagnosis Date   Anxiety     Arthritis    Asthma    Bladder leak    Cerebral aneurysm    history-has coils   CHF (congestive heart failure) (HCC)    Chronic kidney disease    COPD (chronic obstructive pulmonary disease) (HCC)    Depression    Diabetes mellitus without complication (HCC)    Dyspnea    GERD (gastroesophageal reflux disease)    Glaucoma    Headache    History of kidney stones    Hyperlipidemia    Hypertension    MI (myocardial infarction) (HCC)    unsure when   Neuropathy    Rotator cuff injury    Seasonal allergies    Stroke [facility])    TIA's   Past Surgical History:  Procedure Laterality Date   BRAIN SURGERY Right 2010   anuerysm repair   CARPAL TUNNEL RELEASE Right 06/12/2017   Procedure: CARPAL TUNNEL RELEASE;  Surgeon: Juanell Fairly, MD;  Location: [facility] ORS;  Service: Orthopedics;  Laterality: Right;   CHOLECYSTECTOMY     COLONOSCOPY WITH PROPOFOL N/A 08/09/2021   Procedure: COLONOSCOPY WITH PROPOFOL;  Surgeon: Regis Bill, MD;  Location: [facility];  Service: Endoscopy;  Laterality: N/A;   ESOPHAGOGASTRODUODENOSCOPY (EGD) WITH PROPOFOL N/A 08/09/2021   Procedure: ESOPHAGOGASTRODUODENOSCOPY (EGD) WITH PROPOFOL;  Surgeon: Regis Bill, MD;  Location: [facility];  Service: Endoscopy;  Laterality: N/A;   JOINT REPLACEMENT Bilateral  TOTAL KNEE REPLACEMENT   SHOULDER ARTHROSCOPY WITH OPEN ROTATOR CUFF REPAIR Left 11/23/2016   Procedure: SHOULDER ARTHROSCOPY WITH OPEN ROTATOR CUFF REPAIR, ARTHROSCOPIC SUBACROMIAL DECOMPRESSION, DISTAL CLAVICLE EXCISION;  Surgeon: Juanell Fairly, MD;  Location: [facility] ORS;  Service: Orthopedics;  Laterality: Left;   SHOULDER ARTHROSCOPY WITH OPEN ROTATOR CUFF REPAIR Right 07/30/2018   Procedure: SHOULDER ARTHROSCOPY WITH OPEN ROTATOR CUFF REPAIR,SUBACROMINAL DECOMPRESSION AND DISTAL CLAVICLE EXCISION;  Surgeon: Juanell Fairly, MD;  Location: [facility] ORS;  Service: Orthopedics;  Laterality: Right;   TOTAL KNEE REVISION Right 05/05/2020    Procedure: right total knee arthroplasty revision;  Surgeon: Lyndle Herrlich, MD;  Location: [facility] ORS;  Service: Orthopedics;  Laterality: Right;     Current Meds  Medication Sig   ALPRAZolam (XANAX) 0.5 MG tablet Take 0.5 mg by mouth 2 (two) times daily as needed for anxiety.   aspirin EC 81 MG tablet Take 81 mg by mouth daily. Swallow whole.   atorvastatin (LIPITOR) 40 MG tablet Take 40 mg by mouth at bedtime.   brimonidine-timolol (COMBIGAN) 0.2-0.5 % ophthalmic solution Place 1 drop into both eyes 2 (two) times daily.   brinzolamide (AZOPT) 1 % ophthalmic suspension Place 1 drop into both eyes 3 (three) times daily.   carvedilol (COREG) 6.25 MG tablet Take 1 tablet (6.25 mg total) by mouth 2 (two) times daily.   cetirizine (ZYRTEC) 10 MG tablet Take 10 mg by mouth daily.   diclofenac (VOLTAREN) 75 MG EC tablet Take 75 mg by mouth 2 (two) times daily.   dicyclomine (BENTYL) 10 MG capsule Take 10 mg by mouth 4 (four) times daily -  before meals and at bedtime.   DULoxetine (CYMBALTA) 60 MG capsule Take 60 mg by mouth at bedtime.    empagliflozin (JARDIANCE) 25 MG TABS tablet Take 25 mg by mouth daily.   EPINEPHrine 0.3 mg/0.3 mL IJ SOAJ injection Inject 0.3 mg into the skin as needed for anaphylaxis.    esomeprazole (NEXIUM) 40 MG capsule Take 40 mg by mouth daily.   fluticasone (FLONASE) 50 MCG/ACT nasal spray Place 1 spray into both nostrils daily.    furosemide (LASIX) 40 MG tablet Take 40 mg by mouth 2 (two) times daily.    gabapentin (NEURONTIN) 300 MG capsule Take 300 mg by mouth 3 (three) times daily.   isosorbide mononitrate (IMDUR) 60 MG 24 hr tablet Take 1.5 tablets (90 mg total) by mouth daily.   latanoprost (XALATAN) 0.005 % ophthalmic solution Place 1 drop into both eyes at bedtime.   meloxicam (MOBIC) 7.5 MG tablet Take 7.5 mg by mouth 2 (two) times daily.   MYRBETRIQ 25 MG TB24 tablet TAKE 1 TABLET BY MOUTH EVERY DAY for bladder   nitroGLYCERIN (NITROSTAT) 0.4 MG SL tablet  Place 1 tablet (0.4 mg total) under the tongue every 5 (five) minutes as needed for chest pain.   solifenacin (VESICARE) 10 MG tablet Take 10 mg by mouth daily.   SYMBICORT 160-4.5 MCG/ACT inhaler Inhale 2 puffs into the lungs 2 (two) times daily.   tiZANidine (ZANAFLEX) 4 MG tablet Take 2-4 mg by mouth every 6 (six) hours as needed for muscle spasms.    topiramate (TOPAMAX) 100 MG tablet Take 100 mg by mouth 2 (two) times daily.   traMADol (ULTRAM) 50 MG tablet Take 50 mg by mouth every 6 (six) hours as needed.   valsartan (DIOVAN) 40 MG tablet Take 1 tablet (40 mg total) by mouth daily.   zolpidem (AMBIEN) 10 MG tablet Take 10 mg  by mouth at bedtime as needed.     Allergies:   Bee venom and Penicillins   Social History   Tobacco Use   Smoking status: Former   Smokeless tobacco: Current    Types: Snuff  Vaping Use   Vaping Use: Never used  Substance Use Topics   Alcohol use: No   Drug use: No     Family Hx: The patient's family history includes Breast cancer in her maternal aunt; Diabetes in her mother; Heart attack in her paternal grandfather; Stroke in her paternal grandfather.  ROS:   Please see the history of present illness.     All other systems reviewed and are negative.   Prior CV studies:   The following studies were reviewed today:  Home sleep study and PAP compliance download  Labs/Other Tests and Data Reviewed:    EKG:  No ECG reviewed.  Recent Labs: 01/28/2021: ALT 21 04/08/2021: Hemoglobin 11.1; Platelets 364 04/25/2021: BUN 16; Creatinine, Ser 1.18; Potassium 3.8; Sodium 139   Recent Lipid Panel No results found for: CHOL, TRIG, HDL, CHOLHDL, LDLCALC, LDLDIRECT  Wt Readings from Last 3 Encounters:  09/26/21 281 lb (127.5 kg)  08/17/21 283 lb (128.4 kg)  08/09/21 297 lb (134.7 kg)     Risk Assessment/Calculations:          Objective:    Vital Signs:  BP 130/66   Pulse 82   Ht 5\' 5"  (1.651 m)   Wt 281 lb (127.5 kg)   BMI 46.76 kg/m     VITAL SIGNS:  reviewed GEN:  no acute distress EYES:  sclerae anicteric, EOMI - Extraocular Movements Intact RESPIRATORY:  normal respiratory effort, symmetric expansion CARDIOVASCULAR:  no peripheral edema SKIN:  no rash, lesions or ulcers. MUSCULOSKELETAL:  no obvious deformities. NEURO:  alert and oriented x 3, no obvious focal deficit PSYCH:  normal affect  ASSESSMENT & PLAN:    OSA - The patient is tolerating PAP therapy well without any problems. The patient has been using and benefiting from PAP use and will continue to benefit from therapy.  -I will get a download from her DME   HTN -BP controlled on exam today  -continue prescription drug management with Valsartan 40mg  daiy, Imdur 60mg  daily, carvedilol 6.25mg  BID with PRN refills     COVID-19 Education: The signs and symptoms of COVID-19 were discussed with the patient and how to seek care for testing (follow up with PCP or arrange E-visit).  The importance of social distancing was discussed today.  Time:   Today, I have spent 15 minutes with the patient with telehealth technology discussing the above problems.     Medication Adjustments/Labs and Tests Ordered: Current medicines are reviewed at length with the patient today.  Concerns regarding medicines are outlined above.   Tests Ordered: No orders of the defined types were placed in this encounter.   Medication Changes: No orders of the defined types were placed in this encounter.   Follow Up:  In Person in 1 year(s)  Signed, , MD  09/26/2021 9:45 AM    [facility]

## 2021-09-26 ENCOUNTER — Other Ambulatory Visit: Payer: Self-pay

## 2021-09-26 ENCOUNTER — Encounter: Payer: Self-pay | Admitting: Cardiology

## 2021-09-26 ENCOUNTER — Telehealth (INDEPENDENT_AMBULATORY_CARE_PROVIDER_SITE_OTHER): Payer: Medicare HMO | Admitting: Cardiology

## 2021-09-26 VITALS — BP 130/66 | HR 82 | Ht 65.0 in | Wt 281.0 lb

## 2021-09-26 DIAGNOSIS — G4733 Obstructive sleep apnea (adult) (pediatric): Secondary | ICD-10-CM

## 2021-09-26 DIAGNOSIS — I10 Essential (primary) hypertension: Secondary | ICD-10-CM

## 2021-09-26 NOTE — Patient Instructions (Signed)

## 2021-09-27 ENCOUNTER — Ambulatory Visit: Payer: Medicare HMO

## 2021-09-27 ENCOUNTER — Ambulatory Visit: Payer: Medicare HMO | Admitting: Occupational Therapy

## 2021-09-29 ENCOUNTER — Encounter: Payer: Medicare HMO | Admitting: Occupational Therapy

## 2021-10-03 ENCOUNTER — Ambulatory Visit: Payer: Medicare HMO

## 2021-10-03 ENCOUNTER — Encounter: Payer: Medicare HMO | Admitting: Occupational Therapy

## 2021-10-05 ENCOUNTER — Ambulatory Visit: Payer: Medicare HMO

## 2021-10-05 ENCOUNTER — Encounter: Payer: Medicare HMO | Admitting: Occupational Therapy

## 2021-10-11 ENCOUNTER — Ambulatory Visit: Payer: Medicare HMO

## 2021-10-11 ENCOUNTER — Encounter: Payer: Medicare HMO | Admitting: Occupational Therapy

## 2021-10-14 ENCOUNTER — Encounter: Payer: Medicare HMO | Admitting: Occupational Therapy

## 2021-10-18 ENCOUNTER — Encounter: Payer: Medicare HMO | Admitting: Occupational Therapy

## 2021-10-18 ENCOUNTER — Ambulatory Visit: Payer: Medicare HMO

## 2021-10-20 ENCOUNTER — Ambulatory Visit: Payer: Medicare HMO

## 2021-10-25 ENCOUNTER — Encounter: Payer: Medicare HMO | Admitting: Occupational Therapy

## 2021-10-25 ENCOUNTER — Ambulatory Visit: Payer: Medicare HMO

## 2021-10-27 ENCOUNTER — Ambulatory Visit: Payer: Medicare HMO

## 2021-10-27 ENCOUNTER — Encounter: Payer: Medicare HMO | Admitting: Occupational Therapy

## 2021-11-01 ENCOUNTER — Ambulatory Visit: Payer: Medicare HMO

## 2021-11-01 ENCOUNTER — Encounter: Payer: Medicare HMO | Admitting: Occupational Therapy

## 2021-11-03 ENCOUNTER — Ambulatory Visit: Payer: Medicare HMO

## 2021-11-17 ENCOUNTER — Other Ambulatory Visit: Payer: Medicare HMO

## 2021-11-24 ENCOUNTER — Telehealth: Payer: Self-pay | Admitting: Internal Medicine

## 2021-11-24 MED ORDER — VALSARTAN 40 MG PO TABS: 40.0000 mg | ORAL_TABLET | Freq: Every day | ORAL | 0 refills | Status: DC

## 2021-11-24 MED ORDER — ISOSORBIDE MONONITRATE ER 60 MG PO TB24: 90.0000 mg | ORAL_TABLET | Freq: Every day | ORAL | 0 refills | Status: DC

## 2021-11-24 NOTE — Telephone Encounter (Signed)
°*  STAT* If patient is at the pharmacy, call can be transferred to refill team.   1. Which medications need to be refilled? (please list name of each medication and dose if known)  valsartan 40 MG 1 tablet daily Isosorbide mononitrate 60 MG 1.5 tablet daily   2. Which pharmacy/location (including street and city if local pharmacy) is medication to be sent to?[facility]   3. Do they need a 30 day or 90 day supply? 90 day

## 2021-12-16 ENCOUNTER — Ambulatory Visit: Payer: Medicare HMO | Admitting: Internal Medicine

## 2021-12-26 ENCOUNTER — Ambulatory Visit: Payer: Medicare HMO | Admitting: Nurse Practitioner

## 2021-12-27 NOTE — Progress Notes (Unsigned)
Cardiology Office Note:    Date:  12/27/2021   ID:  Kristine Rangel, DOB [birth year], MRN [id number]  PCP:  Marguerita Merles, MD  [facility] Cardiologist:  Nelva Bush, MD  [place] Electrophysiologist:  None   Referring MD: Marguerita Merles, MD   Chief Complaint: 4 month follow-up  History of Present Illness:    Kristine Rangel is a 66 y.o. female with a hx of with PMH as above, previously seen by Dr. Clayborn Bigness of [facility] cardiology.  [facility] before [facility] with [facility], complaining of worsening right leg pain.Sent to the ED and noted 2-day history of chest pain, where she r/o for ACS, PE, and DVT.   She was initially seen in the office 02/04/2021 by Dr. Saunders Revel for chest pain at the request of Mr. Ronnald Ramp.  She reported intermittent chest pain, radiating to the stomach and back and usually lasting 10 to 15 minutes.  The pain occurred randomly and was not associated with activity or eating.  No relief with GI rx/ PPI.  She had dizziness and SOB, as well as LEE with right greater than that of left.  She had 3 pillow orthopnea.  She reported her CP similar to 2017 with Dr. Clayborn Bigness.     It was felt that her chest pain was atypical, as it was not exertional and occurred randomly.  GI etiology was considered, given recent CTA with esophageal thickening.  Coronary CTA was obtained with coronary artery calcium score of 0.  Echo was obtained with results as below and EF 60 to 65%, G2 DD, RVSP 32.6 mmHg with RAP estimated 3 mmHg, mild LAE,.  LEE thought to reflect venous insufficiency and/or lymphedema.  She was continued on Lasix 40 mg twice daily.  Diet and exercise recommended.   Seen 03/11/2021 s/p right knee replacement.  She had lost her lymphedema pumps.  Compression stockings cut into her legs with ACE bandages discussed.  She reported 3 pillow orthopnea, PND, and fatigue.  She had ongoing CP. GI appointment pending.  She was trying to eliminate  salt and walk daily, as well as eat 3 small meals per day.  She was going to join [employer].  CP improved with improvement in BP and Imdur at follow-ups. She reported ongoing shortness of breath, dyspnea, and cough.  With improved BP, she was sleeping on 2 pillows.  She was waiting on the lymphedema clinic, OSA study, and GI.  She had not yet joined [employer] but was walking every day.      Sleep study showed mild obstructive sleep apnea with no central sleep apnea. Recommendation was for auto CPAP therapy and oral appliance was discussed.  If intolerant to CPAP therapy, ENT referral for possible hypoglossal nerve stimulator was recommended.  Follow-up pulse ox study on CPAP was recommended to assess if supplemental oxygen or bilevel therapy was needed.  Weight loss advised.   Seen 05/02/2021 with ongoing but less frequent CP and diaphoresis.  CP improved with lying flat, lasting 10 minutes. She was waiting on her CPAP and lymphedema appt on 23rd.  She was waiting on a [employer] brachial cuff.  At home, SBP 150 max.  She was still about to join [employer].  She had ordered a bike and was using a home pedal machine. She was walking around the house with her walker at least 2-3 times per week [facility]  [employer] [facility] [facility].She had some DOE, fatigue, palpitations. She still had two-pillow orthopnea. She was started on Coreg.   Last televisit 08/17/21 and was doing well since her last visit. Imdur and Coreg were increased for better BP control.  Today,     Past Medical History:  Diagnosis Date   Anxiety    Arthritis    Asthma    Bladder leak    Cerebral aneurysm    history-has coils   CHF (congestive heart failure) (HCC)    Chronic kidney disease    COPD (chronic obstructive pulmonary disease) (HCC)    Depression    Diabetes mellitus without complication (HCC)    Dyspnea    GERD (gastroesophageal reflux disease)    Glaucoma    Headache     History of kidney stones    Hyperlipidemia    Hypertension    MI (myocardial infarction) ([place])    unsure when   Neuropathy    Rotator cuff injury    Seasonal allergies    Stroke [facility])    TIA's    Past Surgical History:  Procedure Laterality Date   BRAIN SURGERY Right 2010   anuerysm repair   CARPAL TUNNEL RELEASE Right 06/12/2017   Procedure: CARPAL TUNNEL RELEASE;  Surgeon: Thornton Park, MD;  Location: [facility] ORS;  Service: Orthopedics;  Laterality: Right;   CHOLECYSTECTOMY     COLONOSCOPY WITH PROPOFOL N/A 08/09/2021   Procedure: COLONOSCOPY WITH PROPOFOL;  Surgeon: Lesly Rubenstein, MD;  Location: [facility] ENDOSCOPY;  Service: Endoscopy;  Laterality: N/A;   ESOPHAGOGASTRODUODENOSCOPY (EGD) WITH PROPOFOL N/A 08/09/2021   Procedure: ESOPHAGOGASTRODUODENOSCOPY (EGD) WITH PROPOFOL;  Surgeon: Lesly Rubenstein, MD;  Location: [facility] ENDOSCOPY;  Service: Endoscopy;  Laterality: N/A;   JOINT REPLACEMENT Bilateral    TOTAL KNEE REPLACEMENT   SHOULDER ARTHROSCOPY WITH OPEN ROTATOR CUFF REPAIR Left 11/23/2016   Procedure: SHOULDER ARTHROSCOPY WITH OPEN ROTATOR CUFF REPAIR, ARTHROSCOPIC SUBACROMIAL DECOMPRESSION, DISTAL CLAVICLE EXCISION;  Surgeon: Thornton Park, MD;  Location: [facility] ORS;  Service: Orthopedics;  Laterality: Left;   SHOULDER ARTHROSCOPY WITH OPEN ROTATOR CUFF REPAIR Right 07/30/2018   Procedure: SHOULDER ARTHROSCOPY WITH OPEN ROTATOR CUFF REPAIR,SUBACROMINAL DECOMPRESSION AND DISTAL CLAVICLE EXCISION;  Surgeon: Thornton Park, MD;  Location: [facility] ORS;  Service: Orthopedics;  Laterality: Right;   TOTAL KNEE REVISION Right 05/05/2020   Procedure: right total knee arthroplasty revision;  Surgeon: Lovell Sheehan, MD;  Location: [facility] ORS;  Service: Orthopedics;  Laterality: Right;    Current Medications: No outpatient medications have been marked as taking for the 12/30/21 encounter (Appointment) with Kathlen Mody, Stephfon Bovey H, PA-C.     Allergies:   Bee venom and Penicillins   Social  History   Socioeconomic History   Marital status: Single    Spouse name: Not on file   Number of children: Not on file   Years of education: Not on file   Highest education level: Not on file  Occupational History   Not on file  Tobacco Use   Smoking status: Former   Smokeless tobacco: Current    Types: Snuff  Vaping Use   Vaping Use: Never used  Substance and Sexual Activity   Alcohol use: No   Drug use: No   Sexual activity: Not Currently    Birth control/protection: Post-menopausal  Other Topics Concern   Not on file  Social History Narrative   Not on file   Social Determinants of Health   Financial Resource Strain: Not on file  Food Insecurity: Not on file  Transportation Needs: Not on file  Physical Activity: Not on file  Stress: Not on file  Social Connections: Not on file     Family History: The patient's =family history includes Breast cancer in her maternal aunt; Diabetes in her mother; Heart attack in her paternal grandfather; Stroke in her paternal grandfather.  ROS:   Please see the history of present illness.     All other systems reviewed and are negative.  EKGs/Labs/Other Studies Reviewed:    The following studies were reviewed today: Echo 02/2021  1. Left ventricular ejection fraction, by estimation, is 60 to 65%. The  left ventricle has normal function. The left ventricle has no regional  wall motion abnormalities. Left ventricular diastolic parameters are  consistent with Grade II diastolic  dysfunction (pseudonormalization).   2. Right ventricular systolic function is normal. The right ventricular  size is normal. There is normal pulmonary artery systolic pressure. The  estimated right ventricular systolic pressure is [id number] mmHg.   3. Left atrial size was mildly dilated.    CT coronary score 02/24/21 IMPRESSION: 1. Normal coronary calcium score of 0. Patient is low risk for coronary events. 2. Normal coronary origin with right  dominance. 3. No evidence of CAD. 4. CAD-RADS 0. Consider non-atherosclerotic causes of chest pain.   Echo 03/03/21  1. Left ventricular ejection fraction, by estimation, is 60 to 65%. The  left ventricle has normal function. The left ventricle has no regional  wall motion abnormalities. Left ventricular diastolic parameters are  consistent with Grade II diastolic  dysfunction (pseudonormalization).   2. Right ventricular systolic function is normal. The right ventricular  size is normal. There is normal pulmonary artery systolic pressure. The  estimated right ventricular systolic pressure is [id number] mmHg.   3. Left atrial size was mildly dilated.   EKG:  EKG is *** ordered today.  The ekg ordered today demonstrates ***  Recent Labs: 01/28/2021: ALT 21 04/08/2021: Hemoglobin 11.1; Platelets 364 04/25/2021: BUN 16; Creatinine, Ser 1.18; Potassium 3.8; Sodium 139  Recent Lipid Panel No results found for: CHOL, TRIG, HDL, CHOLHDL, VLDL, LDLCALC, LDLDIRECT   Risk Assessment/Calculations:   {Does this patient have ATRIAL FIBRILLATION?[phone]}   Physical Exam:    VS:  There were no vitals taken for this visit.    Wt Readings from Last 3 Encounters:  09/26/21 281 lb (127.5 kg)  08/17/21 283 lb (128.4 kg)  08/09/21 297 lb (134.7 kg)     GEN: *** Well nourished, well developed in no acute distress HEENT: Normal NECK: No JVD; No carotid bruits LYMPHATICS: No lymphadenopathy CARDIAC: ***RRR, no murmurs, rubs, gallops RESPIRATORY:  Clear to auscultation without rales, wheezing or rhonchi  ABDOMEN: Soft, non-tender, non-distended MUSCULOSKELETAL:  No edema; No deformity  SKIN: Warm and dry NEUROLOGIC:  Alert and oriented x 3 PSYCHIATRIC:  Normal affect   ASSESSMENT:    No diagnosis found. PLAN:    In order of problems listed above:  HTN  HFpEF  Lymphedema/venous insufficieny  OSA on CPAP    Disposition: Follow up {follow up:15908} with ***   Shared Decision  Making/Informed Consent   {Are you ordering a CV Procedure (e.g. stress test, cath, DCCV, TEE, etc)?   Press F2        :[id number]    Signed, Itali Mckendry Ninfa Meeker, PA-C  12/27/2021 3:05 PM    [facility]

## 2021-12-30 ENCOUNTER — Ambulatory Visit: Payer: Medicare HMO | Admitting: Medical

## 2022-01-27 ENCOUNTER — Ambulatory Visit: Payer: Medicare HMO | Admitting: Nurse Practitioner

## 2022-01-31 ENCOUNTER — Ambulatory Visit (INDEPENDENT_AMBULATORY_CARE_PROVIDER_SITE_OTHER): Payer: Medicare HMO | Admitting: Nurse Practitioner

## 2022-01-31 ENCOUNTER — Other Ambulatory Visit: Payer: Self-pay

## 2022-01-31 ENCOUNTER — Encounter: Payer: Self-pay | Admitting: Nurse Practitioner

## 2022-01-31 VITALS — BP 140/84 | HR 66 | Ht 65.0 in | Wt 301.0 lb

## 2022-01-31 DIAGNOSIS — I10 Essential (primary) hypertension: Secondary | ICD-10-CM | POA: Diagnosis not present

## 2022-01-31 DIAGNOSIS — G4733 Obstructive sleep apnea (adult) (pediatric): Secondary | ICD-10-CM | POA: Diagnosis not present

## 2022-01-31 DIAGNOSIS — I5032 Chronic diastolic (congestive) heart failure: Secondary | ICD-10-CM

## 2022-01-31 DIAGNOSIS — R072 Precordial pain: Secondary | ICD-10-CM | POA: Diagnosis not present

## 2022-01-31 NOTE — Progress Notes (Signed)
? ? ?Office Visit  ?  ?Patient Name: Kristine Rangel ?Date of Encounter: 01/31/2022 ? ?Primary Care Provider:  Marguerita Merles, MD ?Primary Cardiologist:  Nelva Bush, MD ? ?Chief Complaint  ?  ?66 y/o ? w/ a h/o chest pain, hypertension, hyperlipidemia, type 2 diabetes mellitus, cerebral aneurysm status post coiling, COPD, and HFpEF, who presents for follow-up related to HTN and HFpEF. ? ?Past Medical History  ?  ?Past Medical History:  ?Diagnosis Date  ? (HFpEF) heart failure with preserved ejection fraction ([place])   ? a. 02/2021 Echo: EF 60-65%, no rwma, GrII DD. RVSP 32.32mmHg, mildly dil LA.  ? Anxiety   ? Arthritis   ? Asthma   ? Bladder leak   ? Cerebral aneurysm   ? history-has coils  ? Chest pain   ? a. 09/2008 Neg [place] Echo; b. 02/2021 Cor CTA: Ca2+ = 0. Nl cors.  ? Chronic kidney disease   ? COPD (chronic obstructive pulmonary disease) ([place])   ? Depression   ? Diabetes mellitus without complication ([place])   ? Dyspnea   ? GERD (gastroesophageal reflux disease)   ? Glaucoma   ? Headache   ? History of kidney stones   ? Hyperlipidemia   ? Hypertension   ? MI (myocardial infarction) ([place])   ? unsure when  ? Neuropathy   ? Rotator cuff injury   ? Seasonal allergies   ? Stroke [facility])   ? TIA's  ? ?Past Surgical History:  ?Procedure Laterality Date  ? BRAIN SURGERY Right 2010  ? anuerysm repair  ? CARPAL TUNNEL RELEASE Right 06/12/2017  ? Procedure: CARPAL TUNNEL RELEASE;  Surgeon: Thornton Park, MD;  Location: [facility] ORS;  Service: Orthopedics;  Laterality: Right;  ? CHOLECYSTECTOMY    ? COLONOSCOPY WITH PROPOFOL N/A 08/09/2021  ? Procedure: COLONOSCOPY WITH PROPOFOL;  Surgeon: Lesly Rubenstein, MD;  Location: [facility] ENDOSCOPY;  Service: Endoscopy;  Laterality: N/A;  ? ESOPHAGOGASTRODUODENOSCOPY (EGD) WITH PROPOFOL N/A 08/09/2021  ? Procedure: ESOPHAGOGASTRODUODENOSCOPY (EGD) WITH PROPOFOL;  Surgeon: Lesly Rubenstein, MD;  Location: [facility] ENDOSCOPY;  Service: Endoscopy;  Laterality: N/A;  ? JOINT REPLACEMENT  Bilateral   ? TOTAL KNEE REPLACEMENT  ? SHOULDER ARTHROSCOPY WITH OPEN ROTATOR CUFF REPAIR Left 11/23/2016  ? Procedure: SHOULDER ARTHROSCOPY WITH OPEN ROTATOR CUFF REPAIR, ARTHROSCOPIC SUBACROMIAL DECOMPRESSION, DISTAL CLAVICLE EXCISION;  Surgeon: Thornton Park, MD;  Location: [facility] ORS;  Service: Orthopedics;  Laterality: Left;  ? SHOULDER ARTHROSCOPY WITH OPEN ROTATOR CUFF REPAIR Right 07/30/2018  ? Procedure: SHOULDER ARTHROSCOPY WITH OPEN ROTATOR CUFF REPAIR,SUBACROMINAL DECOMPRESSION AND DISTAL CLAVICLE EXCISION;  Surgeon: Thornton Park, MD;  Location: [facility] ORS;  Service: Orthopedics;  Laterality: Right;  ? TOTAL KNEE REVISION Right 05/05/2020  ? Procedure: right total knee arthroplasty revision;  Surgeon: Lovell Sheehan, MD;  Location: [facility] ORS;  Service: Orthopedics;  Laterality: Right;  ? ? ?Allergies ? ?Allergies  ?Allergen Reactions  ? Bee Venom Anaphylaxis  ? Penicillins Hives, Swelling and Other (See Comments)  ?  Has patient had a PCN reaction causing immediate rash, facial/tongue/throat swelling, SOB or lightheadedness with hypotension: Yes ?Has patient had a PCN reaction causing severe rash involving mucus membranes or skin necrosis: No ?Has patient had a PCN reaction that required hospitalization No ?Has patient had a PCN reaction occurring within the last 10 years: No ?If all of the above answers are "NO", then may proceed with Cephalosporin use.  ? ? ?History of Present Illness  ?  ?66 year old female with the above complex  past medical history including chest pain, hypertension, hyperlipidemia, type 2 diabetes mellitus, cerebral aneurysm status post coiling, COPD, and HFpEF.  She previously underwent dobutamine stress testing in 2009, which showed no evidence of wall motion abnormalities, and had been followed by Dr. Clayborn Bigness.  In March 2022, she was seen by Dr. Saunders Revel with complaints of intermittent chest pain, dyspnea, dizziness, and lower extremity swelling.  Echocardiogram showed an EF of 60  to 65% with grade 2 diastolic dysfunction and RVSP of 32.6 mmHg.  Coronary CT angiography showed normal coronary arteries with a calcium score of 0. ? ?She was last seen in cardiology clinic in October 2022 at which time she reported ongoing atypical chest pain that were felt to be GI in origin.  Isosorbide was titrated to 90 mg daily for possible esophageal spasm.  Carvedilol was also titrated in the setting of ongoing hypertension.  Since her last visit, she notes that blood pressures at home are typically in the 120-130 range.  She ambulates with a walker and notes that she has done reasonably well.  At her usual pace, she does not experience dyspnea on exertion.  She has not had any chest pain.  She feels that her fluid status has been well controlled and she denies edema, PND, orthopnea, or early satiety.  Further, she has not been experiencing any palpitations, dizziness, or syncope. ? ?Home Medications  ?  ?Current Outpatient Medications  ?Medication Sig Dispense Refill  ? aspirin EC 81 MG tablet Take 81 mg by mouth daily. Swallow whole.    ? atorvastatin (LIPITOR) 40 MG tablet Take 40 mg by mouth at bedtime.    ? brimonidine-timolol (COMBIGAN) 0.2-0.5 % ophthalmic solution Place 1 drop into both eyes 2 (two) times daily.    ? brinzolamide (AZOPT) 1 % ophthalmic suspension Place 1 drop into both eyes 3 (three) times daily.    ? carvedilol (COREG) 6.25 MG tablet Take 1 tablet (6.25 mg total) by mouth 2 (two) times daily. 180 tablet 3  ? cetirizine (ZYRTEC) 10 MG tablet Take 10 mg by mouth daily.    ? diclofenac (VOLTAREN) 75 MG EC tablet Take 75 mg by mouth 2 (two) times daily.    ? DULoxetine (CYMBALTA) 60 MG capsule Take 60 mg by mouth at bedtime.     ? empagliflozin (JARDIANCE) 25 MG TABS tablet Take 25 mg by mouth daily.    ? EPINEPHrine 0.3 mg/0.3 mL IJ SOAJ injection Inject 0.3 mg into the skin as needed for anaphylaxis.     ? esomeprazole (NEXIUM) 40 MG capsule Take 40 mg by mouth daily.    ?  fluticasone (FLONASE) 50 MCG/ACT nasal spray Place 1 spray into both nostrils daily.     ? furosemide (LASIX) 40 MG tablet Take 40 mg by mouth 2 (two) times daily.     ? gabapentin (NEURONTIN) 300 MG capsule Take 300 mg by mouth 3 (three) times daily.    ? isosorbide mononitrate (IMDUR) 60 MG 24 hr tablet Take 1.5 tablets (90 mg total) by mouth daily. 135 tablet 0  ? latanoprost (XALATAN) 0.005 % ophthalmic solution Place 1 drop into both eyes at bedtime.    ? meloxicam (MOBIC) 7.5 MG tablet Take 7.5 mg by mouth 2 (two) times daily.    ? MYRBETRIQ 25 MG TB24 tablet TAKE 1 TABLET BY MOUTH EVERY DAY for bladder    ? nitroGLYCERIN (NITROSTAT) 0.4 MG SL tablet Place 1 tablet (0.4 mg total) under the tongue every 5 (five)  minutes as needed for chest pain. 25 tablet 3  ? solifenacin (VESICARE) 10 MG tablet Take 10 mg by mouth daily.    ? SYMBICORT 160-4.5 MCG/ACT inhaler Inhale 2 puffs into the lungs 2 (two) times daily.    ? tiZANidine (ZANAFLEX) 4 MG tablet Take 2-4 mg by mouth every 6 (six) hours as needed for muscle spasms.     ? topiramate (TOPAMAX) 100 MG tablet Take 100 mg by mouth 2 (two) times daily.    ? traMADol (ULTRAM) 50 MG tablet Take 50 mg by mouth every 6 (six) hours as needed.    ? valsartan (DIOVAN) 40 MG tablet Take 1 tablet (40 mg total) by mouth daily. 90 tablet 0  ? zolpidem (AMBIEN) 10 MG tablet Take 10 mg by mouth at bedtime as needed.    ? ALPRAZolam (XANAX) 0.5 MG tablet Take 0.5 mg by mouth 2 (two) times daily as needed for anxiety. (Patient not taking: Reported on 01/31/2022)    ? dicyclomine (BENTYL) 10 MG capsule Take 10 mg by mouth 4 (four) times daily -  before meals and at bedtime. (Patient not taking: Reported on 01/31/2022)    ? POTASSIUM CHLORIDE PO Take 15 mEq by mouth daily. (Patient not taking: Reported on 08/17/2021)    ? ?No current facility-administered medications for this visit.  ?  ? ?Review of Systems  ?  ?Overall feeling well.  She denies chest pain, dyspnea, palpitations, PND,  orthopnea, dizziness, syncope, edema, or early satiety.  All other systems reviewed and are otherwise negative except as noted above. ?  ? ?Physical Exam  ?  ?VS:  BP 140/84   Pulse 66   Ht 5\' 5"  (1.651 m)   W

## 2022-01-31 NOTE — Patient Instructions (Signed)
Medication Instructions:  ? ?Your physician recommends that you continue on your current medications as directed. Please refer to the Current Medication list given to you today. ? ?*If you need a refill on your cardiac medications before your next appointment, please call your pharmacy* ? ? ?Lab Work: ? ?None ordered ? ?Testing/Procedures: ? ?None ordered ? ? ?Follow-Up: ?At [facility], you and your health needs are our priority.  As part of our continuing mission to provide you with exceptional heart care, we have created designated Provider Care Teams.  These Care Teams include your primary Cardiologist (physician) and Advanced Practice Providers (APPs -  Physician Assistants and Nurse Practitioners) who all work together to provide you with the care you need, when you need it. ? ?We recommend signing up for the patient portal called "MyChart".  Sign up information is provided on this After Visit Summary.  MyChart is used to connect with patients for Virtual Visits (Telemedicine).  Patients are able to view lab/test results, encounter notes, upcoming appointments, etc.  Non-urgent messages can be sent to your provider as well.   ?To learn more about what you can do with MyChart, go to [email or web address].   ? ?Your next appointment:   ?6 month(s) ? ?The format for your next appointment:   ?In Person ? ?Provider:   ?You may see Nelva Bush, MD or one of the following Advanced Practice Providers on your designated Care Team:   ?Murray Hodgkins, NP1}  ? ? ?Other Instructions ? ?Check your blood pressure 3-5 times per week  ?Please contact our office to give update of BP readings in 1-2 weeks ?We recommend that you check blood pressure approximately 2 hours after morning medication ? ?

## 2022-02-20 ENCOUNTER — Other Ambulatory Visit: Payer: Self-pay | Admitting: Internal Medicine

## 2022-05-15 ENCOUNTER — Emergency Department: Payer: Medicare HMO

## 2022-05-15 ENCOUNTER — Encounter: Payer: Self-pay | Admitting: Emergency Medicine

## 2022-05-15 ENCOUNTER — Other Ambulatory Visit: Payer: Self-pay

## 2022-05-15 ENCOUNTER — Observation Stay
Admission: EM | Admit: 2022-05-15 | Discharge: 2022-05-18 | Disposition: A | Discharge status: Home / Self Care | Payer: Medicare HMO | Attending: Internal Medicine | Admitting: Internal Medicine

## 2022-05-15 DIAGNOSIS — Z87891 Personal history of nicotine dependence: Secondary | ICD-10-CM | POA: Insufficient documentation

## 2022-05-15 DIAGNOSIS — E1122 Type 2 diabetes mellitus with diabetic chronic kidney disease: Secondary | ICD-10-CM | POA: Diagnosis not present

## 2022-05-15 DIAGNOSIS — G4733 Obstructive sleep apnea (adult) (pediatric): Secondary | ICD-10-CM

## 2022-05-15 DIAGNOSIS — G629 Polyneuropathy, unspecified: Secondary | ICD-10-CM

## 2022-05-15 DIAGNOSIS — Z8673 Personal history of transient ischemic attack (TIA), and cerebral infarction without residual deficits: Secondary | ICD-10-CM | POA: Diagnosis not present

## 2022-05-15 DIAGNOSIS — J449 Chronic obstructive pulmonary disease, unspecified: Secondary | ICD-10-CM | POA: Diagnosis not present

## 2022-05-15 DIAGNOSIS — I10 Essential (primary) hypertension: Secondary | ICD-10-CM | POA: Diagnosis present

## 2022-05-15 DIAGNOSIS — Z7984 Long term (current) use of oral hypoglycemic drugs: Secondary | ICD-10-CM | POA: Insufficient documentation

## 2022-05-15 DIAGNOSIS — I5032 Chronic diastolic (congestive) heart failure: Secondary | ICD-10-CM | POA: Diagnosis not present

## 2022-05-15 DIAGNOSIS — N1831 Chronic kidney disease, stage 3a: Secondary | ICD-10-CM | POA: Diagnosis not present

## 2022-05-15 DIAGNOSIS — Z7982 Long term (current) use of aspirin: Secondary | ICD-10-CM | POA: Diagnosis not present

## 2022-05-15 DIAGNOSIS — Z79899 Other long term (current) drug therapy: Secondary | ICD-10-CM | POA: Insufficient documentation

## 2022-05-15 DIAGNOSIS — R519 Headache, unspecified: Secondary | ICD-10-CM | POA: Diagnosis present

## 2022-05-15 DIAGNOSIS — R55 Syncope and collapse: Secondary | ICD-10-CM

## 2022-05-15 DIAGNOSIS — Z9989 Dependence on other enabling machines and devices: Secondary | ICD-10-CM

## 2022-05-15 DIAGNOSIS — J45909 Unspecified asthma, uncomplicated: Secondary | ICD-10-CM | POA: Diagnosis not present

## 2022-05-15 DIAGNOSIS — Z96653 Presence of artificial knee joint, bilateral: Secondary | ICD-10-CM | POA: Insufficient documentation

## 2022-05-15 DIAGNOSIS — I130 Hypertensive heart and chronic kidney disease with heart failure and stage 1 through stage 4 chronic kidney disease, or unspecified chronic kidney disease: Secondary | ICD-10-CM | POA: Diagnosis not present

## 2022-05-15 DIAGNOSIS — E1169 Type 2 diabetes mellitus with other specified complication: Secondary | ICD-10-CM

## 2022-05-15 DIAGNOSIS — E785 Hyperlipidemia, unspecified: Secondary | ICD-10-CM

## 2022-05-15 DIAGNOSIS — R531 Weakness: Principal | ICD-10-CM | POA: Insufficient documentation

## 2022-05-15 DIAGNOSIS — K219 Gastro-esophageal reflux disease without esophagitis: Secondary | ICD-10-CM

## 2022-05-15 LAB — BASIC METABOLIC PANEL
Anion gap: 9 (ref 5–15)
BUN: 19 mg/dL (ref 8–23)
CO2: 25 mmol/L (ref 22–32)
Calcium: 10.5 mg/dL — ABNORMAL HIGH (ref 8.9–10.3)
Chloride: 106 mmol/L (ref 98–111)
Creatinine, Ser: 1.12 mg/dL — ABNORMAL HIGH (ref 0.44–1.00)
GFR, Estimated: 54 mL/min — ABNORMAL LOW (ref >60)
Glucose, Bld: 96 mg/dL (ref 70–99)
Potassium: 4.1 mmol/L (ref 3.5–5.1)
Sodium: 140 mmol/L (ref 135–145)

## 2022-05-15 LAB — CBC
HCT: 40.9 % (ref 36.0–46.0)
Hemoglobin: 12.8 g/dL (ref 12.0–15.0)
MCH: 26.2 pg (ref 26.0–34.0)
MCHC: 31.3 g/dL (ref 30.0–36.0)
MCV: 83.6 fL (ref 80.0–100.0)
Platelets: 351 10*3/uL (ref 150–400)
RBC: 4.89 MIL/uL (ref 3.87–5.11)
RDW: 16.6 % — ABNORMAL HIGH (ref 11.5–15.5)
WBC: 5.3 10*3/uL (ref 4.0–10.5)
nRBC: 0 % (ref 0.0–0.2)

## 2022-05-15 LAB — URINALYSIS, ROUTINE W REFLEX MICROSCOPIC
Bacteria, UA: NONE SEEN
Bilirubin Urine: NEGATIVE
Glucose, UA: >=500 mg/dL — AB
Hgb urine dipstick: NEGATIVE
Ketones, ur: NEGATIVE mg/dL
Nitrite: NEGATIVE
Protein, ur: NEGATIVE mg/dL
Specific Gravity, Urine: 1.011 (ref 1.005–1.030)
pH: 5 (ref 5.0–8.0)

## 2022-05-15 LAB — TROPONIN I (HIGH SENSITIVITY)
Troponin I (High Sensitivity): 6 ng/L (ref <18)
Troponin I (High Sensitivity): 4 ng/L (ref <18)

## 2022-05-15 LAB — HIV ANTIBODY (ROUTINE TESTING W REFLEX): HIV Screen 4th Generation wRfx: NONREACTIVE

## 2022-05-15 MED ORDER — FUROSEMIDE 40 MG PO TABS
40.0000 mg | ORAL_TABLET | Freq: Every day | ORAL | Status: DC
  Administered 2022-05-16 – 2022-05-18 (×3): 40 mg via ORAL
  Filled 2022-05-15 (×3): qty 1

## 2022-05-15 MED ORDER — HYDROCODONE-ACETAMINOPHEN 5-325 MG PO TABS: 1.0000 | ORAL_TABLET | ORAL | Status: DC | PRN

## 2022-05-15 MED ORDER — SODIUM CHLORIDE 0.9% FLUSH
3.0000 mL | Freq: Two times a day (BID) | INTRAVENOUS | Status: DC
  Administered 2022-05-15 – 2022-05-18 (×7): 3 mL via INTRAVENOUS

## 2022-05-15 MED ORDER — ONDANSETRON HCL 4 MG PO TABS: 4.0000 mg | ORAL_TABLET | Freq: Four times a day (QID) | ORAL | Status: DC | PRN

## 2022-05-15 MED ORDER — ONDANSETRON HCL 4 MG/2ML IJ SOLN: 4.0000 mg | Freq: Four times a day (QID) | INTRAMUSCULAR | Status: DC | PRN

## 2022-05-15 MED ORDER — ACETAMINOPHEN 650 MG RE SUPP: 650.0000 mg | Freq: Four times a day (QID) | RECTAL | Status: DC | PRN

## 2022-05-15 MED ORDER — ACETAMINOPHEN 325 MG PO TABS: 650.0000 mg | ORAL_TABLET | Freq: Four times a day (QID) | ORAL | Status: DC | PRN

## 2022-05-15 MED ORDER — LATANOPROST 0.005 % OP SOLN
1.0000 [drp] | Freq: Every day | OPHTHALMIC | Status: DC
  Administered 2022-05-16 – 2022-05-17 (×2): 1 [drp] via OPHTHALMIC
  Filled 2022-05-15: qty 2.5

## 2022-05-15 MED ORDER — PANTOPRAZOLE SODIUM 40 MG PO TBEC
40.0000 mg | DELAYED_RELEASE_TABLET | Freq: Every day | ORAL | Status: DC
  Administered 2022-05-15 – 2022-05-18 (×4): 40 mg via ORAL
  Filled 2022-05-15 (×4): qty 1

## 2022-05-15 MED ORDER — FLUTICASONE FUROATE-VILANTEROL 100-25 MCG/ACT IN AEPB
1.0000 | INHALATION_SPRAY | Freq: Every day | RESPIRATORY_TRACT | Status: DC
  Administered 2022-05-16 – 2022-05-18 (×3): 1 via RESPIRATORY_TRACT
  Filled 2022-05-15: qty 28

## 2022-05-15 MED ORDER — ASPIRIN 81 MG PO TBEC
81.0000 mg | DELAYED_RELEASE_TABLET | Freq: Every day | ORAL | Status: DC
  Administered 2022-05-15 – 2022-05-18 (×4): 81 mg via ORAL
  Filled 2022-05-15 (×4): qty 1

## 2022-05-15 MED ORDER — DULOXETINE HCL 30 MG PO CPEP
60.0000 mg | ORAL_CAPSULE | Freq: Every day | ORAL | Status: DC
  Administered 2022-05-15 – 2022-05-17 (×3): 60 mg via ORAL
  Filled 2022-05-15 (×4): qty 2

## 2022-05-15 MED ORDER — GABAPENTIN 600 MG PO TABS
300.0000 mg | ORAL_TABLET | Freq: Three times a day (TID) | ORAL | Status: DC
  Administered 2022-05-15 – 2022-05-18 (×9): 300 mg via ORAL
  Filled 2022-05-15 (×9): qty 1

## 2022-05-15 MED ORDER — BISACODYL 5 MG PO TBEC: 5.0000 mg | DELAYED_RELEASE_TABLET | Freq: Every day | ORAL | Status: DC | PRN

## 2022-05-15 MED ORDER — IPRATROPIUM-ALBUTEROL 0.5-2.5 (3) MG/3ML IN SOLN: 3.0000 mL | Freq: Four times a day (QID) | RESPIRATORY_TRACT | Status: DC | PRN

## 2022-05-15 MED ORDER — TRAMADOL HCL 50 MG PO TABS
50.0000 mg | ORAL_TABLET | Freq: Four times a day (QID) | ORAL | Status: DC | PRN
  Administered 2022-05-15 – 2022-05-17 (×3): 50 mg via ORAL
  Filled 2022-05-15 (×3): qty 1

## 2022-05-15 MED ORDER — CARVEDILOL 6.25 MG PO TABS
6.2500 mg | ORAL_TABLET | Freq: Two times a day (BID) | ORAL | Status: DC
  Administered 2022-05-15 – 2022-05-18 (×6): 6.25 mg via ORAL
  Filled 2022-05-15 (×6): qty 1

## 2022-05-15 MED ORDER — ISOSORBIDE MONONITRATE ER 30 MG PO TB24
90.0000 mg | ORAL_TABLET | Freq: Every day | ORAL | Status: DC
  Administered 2022-05-16 – 2022-05-18 (×3): 90 mg via ORAL
  Filled 2022-05-15 (×3): qty 3

## 2022-05-15 MED ORDER — ENOXAPARIN SODIUM 40 MG/0.4ML IJ SOSY: 40.0000 mg | PREFILLED_SYRINGE | INTRAMUSCULAR | Status: DC

## 2022-05-15 MED ORDER — MORPHINE SULFATE (PF) 2 MG/ML IV SOLN
1.0000 mg | INTRAVENOUS | Status: DC | PRN
  Administered 2022-05-16: 1 mg via INTRAVENOUS
  Filled 2022-05-15: qty 1

## 2022-05-15 MED ORDER — ENOXAPARIN SODIUM 80 MG/0.8ML IJ SOSY
0.5000 mg/kg | PREFILLED_SYRINGE | INTRAMUSCULAR | Status: DC
  Administered 2022-05-15 – 2022-05-17 (×3): 65 mg via SUBCUTANEOUS
  Filled 2022-05-15 (×3): qty 0.65

## 2022-05-15 NOTE — H&P (Signed)
History and Physical    Kristine Rangel:[id number] DOB: 21-Dec-1955 DOA: 05/15/2022  PCP: Leanna Sato, MD  Patient coming from:     Chief Complaint: sweating & right sided weakness  HPI: 66 y/o F w/ PMH of HTN, HLD, GERD, TIAs, CHF, peripheral neuropathy,CKD,  morbid obesity, COPD, asthma, OSA on CPAP who presents right sided weakness x 3 days. On saturday pt c/o an episode w/ sweating, headache, lightheadedness, nausea and right sided weakness. No of these symptoms improved over the following 2 days so pt came to the ER. Pt denies ever having above stated symptoms in the past. Pt denies any fever, chills, chest pain, vomiting, abd pain, dysuria, urinary urgency, urinary frequency, diarrhea or constipation. Pt uses a walker and lives alone but has a care giver.   Review of Systems: As per HPI otherwise 14 point review of systems negative.    Past Medical History:  Diagnosis Date   (HFpEF) heart failure with preserved ejection fraction (HCC)    a. 02/2021 Echo: EF 60-65%, no rwma, GrII DD. RVSP 32.68mmHg, mildly dil LA.   Anxiety    Arthritis    Asthma    Bladder leak    Cerebral aneurysm    history-has coils   Chest pain    a. 09/2008 Neg [place] Echo; b. 02/2021 Cor CTA: Ca2+ = 0. Nl cors.   Chronic kidney disease    COPD (chronic obstructive pulmonary disease) (HCC)    Depression    Diabetes mellitus without complication (HCC)    Dyspnea    GERD (gastroesophageal reflux disease)    Glaucoma    Headache    History of kidney stones    Hyperlipidemia    Hypertension    MI (myocardial infarction) (HCC)    unsure when   Neuropathy    Rotator cuff injury    Seasonal allergies    Stroke [facility])    TIA's    Past Surgical History:  Procedure Laterality Date   BRAIN SURGERY Right 2010   anuerysm repair   CARPAL TUNNEL RELEASE Right 06/12/2017   Procedure: CARPAL TUNNEL RELEASE;  Surgeon: Juanell Fairly, MD;  Location: [facility] ORS;  Service: Orthopedics;  Laterality:  Right;   CHOLECYSTECTOMY     COLONOSCOPY WITH PROPOFOL N/A 08/09/2021   Procedure: COLONOSCOPY WITH PROPOFOL;  Surgeon: Regis Bill, MD;  Location: [facility] END[facility];  Service: Endoscopy;  Laterality: N/A;   ESOPHAGOGASTRODUODENOSCOPY (EGD) WITH PROPOFOL N/A 08/09/2021   Procedure: ESOPHAGOGASTRODUODENOSCOPY (EGD) WITH PROPOFOL;  Surgeon: Regis Bill, MD;  Location: [facility] ENDOS[facility];  Service: Endoscopy;  Laterality: N/A;   JOINT REPLACEMENT Bilateral    TOTAL KNEE REPLACEMENT   SHOULDER ARTHROSCOPY WITH OPEN ROTATOR CUFF REPAIR Left 11/23/2016   Procedure: SHOULDER ARTHROSCOPY WITH OPEN ROTATOR CUFF REPAIR, ARTHROSCOPIC SUBACROMIAL DECOMPRESSION, DISTAL CLAVICLE EXCISION;  Surgeon: Juanell Fairly, MD;  Location: [facility] ORS;  Service: Orthopedics;  Laterality: Left;   SHOULDER ARTHROSCOPY WITH OPEN ROTATOR CUFF REPAIR Right 07/30/2018   Procedure: SHOULDER ARTHROSCOPY WITH OPEN ROTATOR CUFF REPAIR,SUBACROMINAL DECOMPRESSION AND DISTAL CLAVICLE EXCISION;  Surgeon: Juanell Fairly, MD;  Location: [facility] ORS;  Service: Orthopedics;  Laterality: Right;   TOTAL KNEE REVISION Right 05/05/2020   Procedure: right total knee arthroplasty revision;  Surgeon: Lyndle Herrlich, MD;  Location: [facility] OR[facility];  Service: Orthopedics;  Laterality: Right;     reports that she has quit smoking. Her smokeless tobacco use includes snuff. She reports that she does not drink alcohol and does not use drugs.  Allergies  Allergen Reactions   Bee Venom Anaphylaxis   Penicillins Hives, Swelling and Other (See Comments)    Has patient had a PCN reaction causing immediate rash, facial/tongue/throat swelling, SOB or lightheadedness with hypotension: Yes Has patient had a PCN reaction causing severe rash involving mucus membranes or skin necrosis: No Has patient had a PCN reaction that required hospitalization No Has patient had a PCN reaction occurring within the last 10 years: No If all of the above answers are "NO", then  may proceed with Cephalosporin use.    Family History  Problem Relation Age of Onset   Breast cancer Maternal Aunt    Diabetes Mother    Heart attack Paternal Grandfather    Stroke Paternal Grandfather     Prior to Admission medications   Medication Sig Start Date End Date Taking? Authorizing Provider  ALPRAZolam Prudy Feeler) 0.5 MG tablet Take 0.5 mg by mouth 2 (two) times daily as needed for anxiety. Patient not taking: Reported on 01/31/2022    [provider]  aspirin EC 81 MG tablet Take 81 mg by mouth daily. Swallow whole.    [provider]  atorvastatin (LIPITOR) 40 MG tablet Take 40 mg by mouth at bedtime. 03/13/16   [provider]  brimonidine-timolol (COMBIGAN) 0.2-0.5 % ophthalmic solution Place 1 drop into both eyes 2 (two) times daily.    [provider]  brinzolamide (AZOPT) 1 % ophthalmic suspension Place 1 drop into both eyes 3 (three) times daily.    [provider]  carvedilol (COREG) 6.25 MG tablet Take 1 tablet (6.25 mg total) by mouth 2 (two) times daily. 08/17/21   Marisue Ivan D, PA-C  cetirizine (ZYRTEC) 10 MG tablet Take 10 mg by mouth daily.    [provider]  diclofenac (VOLTAREN) 75 MG EC tablet Take 75 mg by mouth 2 (two) times daily.    [provider]  dicyclomine (BENTYL) 10 MG capsule Take 10 mg by mouth 4 (four) times daily -  before meals and at bedtime. Patient not taking: Reported on 01/31/2022    [provider]  DULoxetine (CYMBALTA) 60 MG capsule Take 60 mg by mouth at bedtime.  03/13/16   [provider]  empagliflozin (JARDIANCE) 25 MG TABS tablet Take 25 mg by mouth daily.    [provider]  EPINEPHrine 0.3 mg/0.3 mL IJ SOAJ injection Inject 0.3 mg into the skin as needed for anaphylaxis.     [provider]  esomeprazole (NEXIUM) 40 MG capsule Take 40 mg by mouth daily. 03/17/16   [provider]  fluticasone (FLONASE) 50 MCG/ACT nasal spray  Place 1 spray into both nostrils daily.     [provider]  furosemide (LASIX) 40 MG tablet Take 40 mg by mouth 2 (two) times daily.     [provider]  gabapentin (NEURONTIN) 300 MG capsule Take 300 mg by mouth 3 (three) times daily.    [provider]  isosorbide mononitrate (IMDUR) 60 MG 24 hr tablet Take 1.5 tablets (90 mg total) by mouth daily. 11/24/21 05/23/22  End, Cristal Deer, MD  latanoprost (XALATAN) 0.005 % ophthalmic solution Place 1 drop into both eyes at bedtime. 03/19/20   [provider]  meloxicam (MOBIC) 7.5 MG tablet Take 7.5 mg by mouth 2 (two) times daily. 01/17/21   [provider]  MYRBETRIQ 25 MG TB24 tablet TAKE 1 TABLET BY MOUTH EVERY DAY for bladder 02/17/21   [provider]  nitroGLYCERIN (NITROSTAT) 0.4 MG SL tablet  Place 1 tablet (0.4 mg total) under the tongue every 5 (five) minutes as needed for chest pain. 04/01/21   Marrianne Mood D, PA-C  POTASSIUM CHLORIDE PO Take 15 mEq by mouth daily. Patient not taking: Reported on 08/17/2021    [provider]  solifenacin (VESICARE) 10 MG tablet Take 10 mg by mouth daily.    [provider]  SYMBICORT 160-4.5 MCG/ACT inhaler Inhale 2 puffs into the lungs 2 (two) times daily. 03/13/16   [provider]  tiZANidine (ZANAFLEX) 4 MG tablet Take 2-4 mg by mouth every 6 (six) hours as needed for muscle spasms.  09/26/16   [provider]  topiramate (TOPAMAX) 100 MG tablet Take 100 mg by mouth 2 (two) times daily.    [provider]  traMADol (ULTRAM) 50 MG tablet Take 50 mg by mouth every 6 (six) hours as needed. 01/25/21   [provider]  valsartan (DIOVAN) 40 MG tablet TAKE 1 TABLET BY MOUTH DAILY 02/20/22   End, Harrell Gave, MD  zolpidem (AMBIEN) 10 MG tablet Take 10 mg by mouth at bedtime as needed. 01/24/21   [provider]    Physical Exam: Vitals:   05/15/22 1202 05/15/22 1205 05/15/22 1400  BP:  120/66 (!)  130/116  Pulse:  81 69  Resp:  20 13  Temp:  98.4 F (36.9 C)   TempSrc:  Oral   SpO2:  96% 97%  Weight: 129.3 kg    Height: 5\' 5"  (1.651 m)      Constitutional: NAD, calm, comfortable. Morbidly obese Vitals:   05/15/22 1202 05/15/22 1205 05/15/22 1400  BP:  120/66 (!) 130/116  Pulse:  81 69  Resp:  20 13  Temp:  98.4 F (36.9 C)   TempSrc:  Oral   SpO2:  96% 97%  Weight: 129.3 kg    Height: 5\' 5"  (1.651 m)     Eyes: PERRL, lids and conjunctivae normal ENMT: Mucous membranes are moist. Neck: normal, supple Respiratory: decreased breath sounds b/l, no wheezing, no crackles. Normal respiratory effort. No accessory muscle use.  Cardiovascular: Regular rate and rhythm, no rubs / gallops.  Abdomen: soft, obese, no tenderness. Hypoactive bowel sounds  Musculoskeletal: no clubbing / cyanosis. No joint deformity upper and lower extremities. Skin: no rashes, lesions, ulcers. Neurologic: CN 2-12 grossly intact. Sensation intact. Mild decreased grip strength on the right  Psychiatric: Normal judgment and insight.   Alert and oriented x 3. Normal mood.     Labs on Admission: I have personally reviewed following labs and imaging studies  CBC: Recent Labs  Lab 05/15/22 1206  WBC 5.3  HGB 12.8  HCT 40.9  MCV 83.6  PLT [id number]   Basic Metabolic Panel: Recent Labs  Lab 05/15/22 1206  NA 140  K 4.1  CL 106  CO2 25  GLUCOSE 96  BUN 19  CREATININE 1.12*  CALCIUM 10.5*   GFR: Estimated Creatinine Clearance: 65.2 mL/min (A) (by C-G formula based on SCr of 1.12 mg/dL (H)). Liver Function Tests: No results for input(s): "AST", "ALT", "ALKPHOS", "BILITOT", "PROT", "ALBUMIN" in the last 168 hours. No results for input(s): "LIPASE", "AMYLASE" in the last 168 hours. No results for input(s): "AMMONIA" in the last 168 hours. Coagulation Profile: No results for input(s): "INR", "PROTIME" in the last 168 hours. Cardiac Enzymes: No results for input(s): "CKTOTAL", "CKMB",  "CKMBINDEX", "TROPONINI" in the last 168 hours. BNP (last 3 results) No results for input(s): "PROBNP" in the last 8760 hours. HbA1C: No  results for input(s): "HGBA1C" in the last 72 hours. CBG: No results for input(s): "GLUCAP" in the last 168 hours. Lipid Profile: No results for input(s): "CHOL", "HDL", "LDLCALC", "TRIG", "CHOLHDL", "LDLDIRECT" in the last 72 hours. Thyroid Function Tests: No results for input(s): "TSH", "T4TOTAL", "FREET4", "T3FREE", "THYROIDAB" in the last 72 hours. Anemia Panel: No results for input(s): "VITAMINB12", "FOLATE", "FERRITIN", "TIBC", "IRON", "RETICCTPCT" in the last 72 hours. Urine analysis:    Component Value Date/Time   COLORURINE YELLOW (A) 04/27/2020 1300   APPEARANCEUR CLEAR (A) 04/27/2020 1300   APPEARANCEUR Hazy (A) 03/16/2020 1053   LABSPEC 1.010 04/27/2020 1300   LABSPEC 1.027 02/11/2013 0109   PHURINE 6.0 04/27/2020 1300   GLUCOSEU NEGATIVE 04/27/2020 1300   GLUCOSEU Negative 02/11/2013 0109   HGBUR NEGATIVE 04/27/2020 1300   BILIRUBINUR NEGATIVE 04/27/2020 1300   BILIRUBINUR Negative 03/16/2020 1053   BILIRUBINUR Negative 02/11/2013 0109   KETONESUR NEGATIVE 04/27/2020 1300   PROTEINUR NEGATIVE 04/27/2020 1300   NITRITE NEGATIVE 04/27/2020 1300   LEUKOCYTESUR NEGATIVE 04/27/2020 1300   LEUKOCYTESUR Negative 02/11/2013 0109    Radiological Exams on Admission: CT Head Wo Contrast  Result Date: 05/15/2022 CLINICAL DATA:  Syncope.  Neuro deficit.  Headache. EXAM: CT HEAD WITHOUT CONTRAST TECHNIQUE: Contiguous axial images were obtained from the base of the skull through the vertex without intravenous contrast. RADIATION DOSE REDUCTION: This exam was performed according to the departmental dose-optimization program which includes automated exposure control, adjustment of the mA and/or kV according to patient size and/or use of iterative reconstruction technique. COMPARISON:  04/08/2016 FINDINGS: Brain: No evidence of acute infarction,  hemorrhage, hydrocephalus, extra-axial collection or mass lesion/mass effect. Vascular: No hyperdense vessel or unexpected calcification. Metallic embolization coils are identified in the region of the basilar artery. Skull: Normal. Negative for fracture or focal lesion. Sinuses/Orbits: No acute finding. Other: None IMPRESSION: 1. No acute intracranial abnormalities. Electronically Signed   By: Kerby Moors M.D.   On: 05/15/2022 14:42    EKG: Independently reviewed.   Assessment/Plan Principal Problem:   Right sided weakness Active Problems:   Benign essential hypertension   Morbid obesity (HCC)   GERD (gastroesophageal reflux disease)   Peripheral neuropathy   HLD (hyperlipidemia)   OSA on CPAP   Chronic diastolic CHF (congestive heart failure) (HCC)   Asthma, chronic   Chronic kidney disease, stage 3a (HCC) Right sided weakness: etiology unclear. CT head shows no acute intracranial abnormalities. Hx of TIAs as per pt. Continue on home dose of aspirin. Continue w/ neuro checks. Neuro consulted, Dr. Reeves Forth by ER physician.   HLD: continue on statin  HTN: continue on coreg, imdur. Hold valsartan   CKD: baseline Cr/GFR is unknown, currently stage IIIa. Hold valsartan   GERD: continue on PPI   Peripheral neuropathy: continue on home dose of gabapentin, duloxetine   Morbidly obese: BMI 47.4. Complicates overall care & prognosis  OSA: on CPAP  Hx of asthma & COPD: moderate persistent. W/o acute exacerbation. Continue on bronchodilators.   Chronic diastolic CHF: continue on lasix at reduce dose of 40 mg daily instead of BID, coreg. Monitor I/Os.     DVT prophylaxis: lovenox Code Status: full  Family Communication: discussed pt's care w/ pt's daughter, Levada Dy, and answered her questions  Disposition Plan: likely d/c back home  Consults called: neuro, Dr. Reeves Forth Admission status: observation   Wyvonnia Dusky MD [facility] Pager [phone]   If 7PM-7AM, please  contact night-coverage [email or web address]   05/15/2022, 4:48 PM

## 2022-05-15 NOTE — Progress Notes (Signed)
PHARMACIST - PHYSICIAN COMMUNICATION  CONCERNING:  Enoxaparin (Lovenox) for DVT Prophylaxis    RECOMMENDATION: Patient was prescribed enoxaprin 40mg  q24 hours for VTE prophylaxis.   Filed Weights   05/15/22 1202  Weight: 129.3 kg (285 lb)    Body mass index is 47.43 kg/m.  Estimated Creatinine Clearance: 65.2 mL/min (A) (by C-G formula based on SCr of 1.12 mg/dL (H)).   Based on [facility] policy patient is candidate for enoxaparin 0.5mg /kg TBW SQ every 24 hours based on BMI being >30.  DESCRIPTION: Pharmacy has adjusted enoxaparin dose per [facility] policy.  Patient is now receiving enoxaparin 65 mg every 24 hours    [facility], PharmD Clinical Pharmacist  05/15/2022 3:08 PM

## 2022-05-15 NOTE — ED Triage Notes (Signed)
Pt via POV from home. Pt c/o near syncope episode on Saturday. Denies LOC. Denies head injury. States she was outside for about and hour or 2 before this episode  Pt states since then she has been having headache and nausea. States that she has been having some "heaviness' on that R side that started Saturday. Denies any vomiting. Pt states that she has a hx of complex migraines. Pt is A&OX4 and NAD

## 2022-05-15 NOTE — Consult Note (Addendum)
Stroke Neurology Consultation Note  Consult Requested by: Dionne Bucy, MD  Reason for Consult: stroke  Consult Date: 05/15/22   The history was obtained from the patient.  During history and examination, all items were able to obtain unless otherwise noted.  History of Present Illness:  Kristine Rangel is a 66 y.o. African American female with episode of near syncopal episode on Saturday while she was shopping.  She was diaphoretic and sweating.  She had headache predominantly on the right side throbbing sensation with right-sided face arm leg numbness and some weakness.  Numbness and weakness on the right side have improved but still present.  She denies having chronic headaches or weakness associated with headaches in the past.  She has a history of coiled aneurysm in 2010.  LSN: Saturday afternoon tPA Given: No: Out of the time window for tPA.  Not a LVO candidate.  Past Medical History:  Diagnosis Date   (HFpEF) heart failure with preserved ejection fraction (HCC)    a. 02/2021 Echo: EF 60-65%, no rwma, GrII DD. RVSP 32.29mmHg, mildly dil LA.   Anxiety    Arthritis    Asthma    Bladder leak    Cerebral aneurysm    history-has coils   Chest pain    a. 09/2008 Neg [place] Echo; b. 02/2021 Cor CTA: Ca2+ = 0. Nl cors.   Chronic kidney disease    COPD (chronic obstructive pulmonary disease) (HCC)    Depression    Diabetes mellitus without complication (HCC)    Dyspnea    GERD (gastroesophageal reflux disease)    Glaucoma    Headache    History of kidney stones    Hyperlipidemia    Hypertension    MI (myocardial infarction) (HCC)    unsure when   Neuropathy    Rotator cuff injury    Seasonal allergies    Stroke [facility])    TIA's    Past Surgical History:  Procedure Laterality Date   BRAIN SURGERY Right 2010   anuerysm repair   CARPAL TUNNEL RELEASE Right 06/12/2017   Procedure: CARPAL TUNNEL RELEASE;  Surgeon: Juanell Fairly, MD;  Location: [facility] ORS;   Service: Orthopedics;  Laterality: Right;   CHOLECYSTECTOMY     COLONOSCOPY WITH PROPOFOL N/A 08/09/2021   Procedure: COLONOSCOPY WITH PROPOFOL;  Surgeon: Regis Bill, MD;  Location: [facility] ENDOSCOPY;  Service: Endoscopy;  Laterality: N/A;   ESOPHAGOGASTRODUODENOSCOPY (EGD) WITH PROPOFOL N/A 08/09/2021   Procedure: ESOPHAGOGASTRODUODENOSCOPY (EGD) WITH PROPOFOL;  Surgeon: Regis Bill, MD;  Location: [facility] ENDOSCOPY;  Service: Endoscopy;  Laterality: N/A;   JOINT REPLACEMENT Bilateral    TOTAL KNEE REPLACEMENT   SHOULDER ARTHROSCOPY WITH OPEN ROTATOR CUFF REPAIR Left 11/23/2016   Procedure: SHOULDER ARTHROSCOPY WITH OPEN ROTATOR CUFF REPAIR, ARTHROSCOPIC SUBACROMIAL DECOMPRESSION, DISTAL CLAVICLE EXCISION;  Surgeon: Juanell Fairly, MD;  Location: [facility] ORS;  Service: Orthopedics;  Laterality: Left;   SHOULDER ARTHROSCOPY WITH OPEN ROTATOR CUFF REPAIR Right 07/30/2018   Procedure: SHOULDER ARTHROSCOPY WITH OPEN ROTATOR CUFF REPAIR,SUBACROMINAL DECOMPRESSION AND DISTAL CLAVICLE EXCISION;  Surgeon: Juanell Fairly, MD;  Location: [facility] ORS;  Service: Orthopedics;  Laterality: Right;   TOTAL KNEE REVISION Right 05/05/2020   Procedure: right total knee arthroplasty revision;  Surgeon: Lyndle Herrlich, MD;  Location: [facility] ORS;  Service: Orthopedics;  Laterality: Right;    Family History  Problem Relation Age of Onset   Breast cancer Maternal Aunt    Diabetes Mother    Heart attack Paternal Grandfather  Stroke Paternal Grandfather     Social History:  reports that she has quit smoking. Her smokeless tobacco use includes snuff. She reports that she does not drink alcohol and does not use drugs.  Allergies:  Allergies  Allergen Reactions   Bee Venom Anaphylaxis   Penicillins Hives, Swelling and Other (See Comments)    Has patient had a PCN reaction causing immediate rash, facial/tongue/throat swelling, SOB or lightheadedness with hypotension: Yes Has patient had a PCN reaction causing  severe rash involving mucus membranes or skin necrosis: No Has patient had a PCN reaction that required hospitalization No Has patient had a PCN reaction occurring within the last 10 years: No If all of the above answers are "NO", then may proceed with Cephalosporin use.    No current facility-administered medications on file prior to encounter.   Current Outpatient Medications on File Prior to Encounter  Medication Sig Dispense Refill   ALPRAZolam (XANAX) 0.5 MG tablet Take 0.5 mg by mouth 2 (two) times daily as needed for anxiety. (Patient not taking: Reported on 01/31/2022)     aspirin EC 81 MG tablet Take 81 mg by mouth daily. Swallow whole.     atorvastatin (LIPITOR) 40 MG tablet Take 40 mg by mouth at bedtime.     brimonidine-timolol (COMBIGAN) 0.2-0.5 % ophthalmic solution Place 1 drop into both eyes 2 (two) times daily.     brinzolamide (AZOPT) 1 % ophthalmic suspension Place 1 drop into both eyes 3 (three) times daily.     carvedilol (COREG) 6.25 MG tablet Take 1 tablet (6.25 mg total) by mouth 2 (two) times daily. 180 tablet 3   cetirizine (ZYRTEC) 10 MG tablet Take 10 mg by mouth daily.     diclofenac (VOLTAREN) 75 MG EC tablet Take 75 mg by mouth 2 (two) times daily.     dicyclomine (BENTYL) 10 MG capsule Take 10 mg by mouth 4 (four) times daily -  before meals and at bedtime. (Patient not taking: Reported on 01/31/2022)     DULoxetine (CYMBALTA) 60 MG capsule Take 60 mg by mouth at bedtime.      empagliflozin (JARDIANCE) 25 MG TABS tablet Take 25 mg by mouth daily.     EPINEPHrine 0.3 mg/0.3 mL IJ SOAJ injection Inject 0.3 mg into the skin as needed for anaphylaxis.      esomeprazole (NEXIUM) 40 MG capsule Take 40 mg by mouth daily.     fluticasone (FLONASE) 50 MCG/ACT nasal spray Place 1 spray into both nostrils daily.      furosemide (LASIX) 40 MG tablet Take 40 mg by mouth 2 (two) times daily.      gabapentin (NEURONTIN) 300 MG capsule Take 300 mg by mouth 3 (three) times  daily.     isosorbide mononitrate (IMDUR) 60 MG 24 hr tablet Take 1.5 tablets (90 mg total) by mouth daily. 135 tablet 0   latanoprost (XALATAN) 0.005 % ophthalmic solution Place 1 drop into both eyes at bedtime.     meloxicam (MOBIC) 7.5 MG tablet Take 7.5 mg by mouth 2 (two) times daily.     MYRBETRIQ 25 MG TB24 tablet TAKE 1 TABLET BY MOUTH EVERY DAY for bladder     nitroGLYCERIN (NITROSTAT) 0.4 MG SL tablet Place 1 tablet (0.4 mg total) under the tongue every 5 (five) minutes as needed for chest pain. 25 tablet 3   POTASSIUM CHLORIDE PO Take 15 mEq by mouth daily. (Patient not taking: Reported on 08/17/2021)     solifenacin (VESICARE) 10  MG tablet Take 10 mg by mouth daily.     SYMBICORT 160-4.5 MCG/ACT inhaler Inhale 2 puffs into the lungs 2 (two) times daily.     tiZANidine (ZANAFLEX) 4 MG tablet Take 2-4 mg by mouth every 6 (six) hours as needed for muscle spasms.      topiramate (TOPAMAX) 100 MG tablet Take 100 mg by mouth 2 (two) times daily.     traMADol (ULTRAM) 50 MG tablet Take 50 mg by mouth every 6 (six) hours as needed.     valsartan (DIOVAN) 40 MG tablet TAKE 1 TABLET BY MOUTH DAILY 90 tablet 2   zolpidem (AMBIEN) 10 MG tablet Take 10 mg by mouth at bedtime as needed.      Review of Systems: A full ROS was attempted today and was  able to be performed.  Systems assessed include - Constitutional, Eyes, HENT, Respiratory, Cardiovascular, Gastrointestinal, Genitourinary, Integument/breast, Hematologic/lymphatic, Musculoskeletal, Neurological, Behavioral/Psych, Endocrine, Allergic/Immunologic - with pertinent responses as per HPI.  Physical Examination: Temp:  [98.4 F (36.9 C)] 98.4 F (36.9 C) (07/03 1205) Pulse Rate:  [69-81] 69 (07/03 1400) Resp:  [13-20] 13 (07/03 1400) BP: (120-130)/(66-116) 130/116 (07/03 1400) SpO2:  [96 %-97 %] 97 % (07/03 1400) Weight:  [129.3 kg] 129.3 kg (07/03 1202)  General - well nourished, well developed, in no apparent distress.     Ophthalmologic - fundi not visualized due to noncooperation.    Cardiovascular - regular rhythm and rate  Extremities: She does have some lymphedema in the lower extremities which is chronic  Mental Status -  Level of arousal and orientation to time, place, and person were intact. Language including expression, naming, repetition, comprehension,  was assessed and found intact. Attention span and concentration were normal. Recent and remote memory were intact. Fund of Knowledge was assessed and was intact.  No aphasia.  Cranial Nerves II - XII - II - Vision intact OU. III, IV, VI - Extraocular movements intact. V - Facial sensation intact bilaterally. VII - Facial movement intact bilaterally. VIII - Hearing & vestibular intact bilaterally. X - Palate elevates symmetrically. XI - Chin turning & shoulder shrug intact bilaterally. XII - Tongue protrusion intact.  Motor Strength -no drift in the upper and lower extremities.  However on strength testing she has 4 out of 5 weakness in the right upper extremity proximally and distally.  Reflexes - The patient's reflexes were normal in all extremities and she had no pathological reflexes  Sensory - Light touch intact on the left.  She says is about 90% on the right arm leg  Coordination - The patient had normal movements in the hands and feet with no ataxia or dysmetria.  Tremor was absent.  Gait and Station - deferred  NIH stroke scale: 1 for measurable sensory loss on the right side.  Data Reviewed: CT Head Wo Contrast  Result Date: 05/15/2022 CLINICAL DATA:  Syncope.  Neuro deficit.  Headache. EXAM: CT HEAD WITHOUT CONTRAST TECHNIQUE: Contiguous axial images were obtained from the base of the skull through the vertex without intravenous contrast. RADIATION DOSE REDUCTION: This exam was performed according to the departmental dose-optimization program which includes automated exposure control, adjustment of the mA and/or kV  according to patient size and/or use of iterative reconstruction technique. COMPARISON:  04/08/2016 FINDINGS: Brain: No evidence of acute infarction, hemorrhage, hydrocephalus, extra-axial collection or mass lesion/mass effect. Vascular: No hyperdense vessel or unexpected calcification. Metallic embolization coils are identified in the region of the basilar artery. Skull: Normal.  Negative for fracture or focal lesion. Sinuses/Orbits: No acute finding. Other: None IMPRESSION: 1. No acute intracranial abnormalities. Electronically Signed   By: Signa Kell M.D.   On: 05/15/2022 14:42    Assessment: 66 y.o. female with history of aneurysmal coiling in setting of presyncopal episode with right-sided weakness and numbness concern for possible CVA.  CT is negative in the ED for bleed.  ER advised to admit for stroke work-up with MRI; however no orders were placed therefore there is delay in ordering.  Will need CTA head and neck to evaluate for aneurysm recurrence.  Her GFR is greater than 30.  Stroke Risk Factors - hyperlipidemia and hypertension  Plan: Defer to primary to rule out cardiac etiology for chest pain. - HgbA1c(completed)  fasting lipid panel -CTA head and neck with contrast is more optimal for aneurysm detection. - MRI  of the brain without contrast - PT consult, OT consult, Speech consult - Echocardiogram not needed as she had one in April of this year. - Prophylactic therapy-Antiplatelet med: Aspirin - dose 81 mg.  Will consider DAPT therapy with aspirin and Plavix if MRI is positive for new stroke. -Continue statin - Risk factor modification - Telemetry monitoring - Frequent neuro checks  She will need outpatient neurology follow-up and has someone she sees at [facility] however she was not aware that Dr. Clelia Croft, neurologist, practices at [facility] clinic next-door.  Plan discussed with hospitalist.  Thank you for this consultation and allowing [place] to participate in the care of this  patient.    Total 60 minutes spent on counseling patient and coordinating care, writing notes and reviewing chart. neurological assessment, personal review of neuroimaging, discussion with family, other specialists and medical decision making of high complexity.  Jalei Shibley,MD

## 2022-05-15 NOTE — ED Notes (Signed)
Lab called to add on troponin 

## 2022-05-15 NOTE — ED Notes (Signed)
RN aware of bed assignment 

## 2022-05-15 NOTE — ED Provider Notes (Signed)
[facility] Provider Note    Event Date/Time   First MD Initiated Contact with Patient 05/15/22 1340     (approximate)   History   Headache and Near Syncope   HPI  Kristine Rangel is a 66 y.o. female with history of hypertension, hyperlipidemia, diabetes, COPD, CHF, and remote history of cerebral aneurysm status post coiling who presents with reporting an episode of near syncope 3 days ago when she was carrying some items outside in the heat.  She felt lightheaded and nauseous and was diaphoretic.  She started having a right-sided headache.  The headache has persisted since that time and she subsequent developed blurred vision in her right eye and heaviness in her right arm.  She has not had any difficulty speaking or walking.   Physical Exam   Triage Vital Signs: ED Triage Vitals  Enc Vitals Group     BP 05/15/22 1205 120/66     Pulse Rate 05/15/22 1205 81     Resp 05/15/22 1205 20     Temp 05/15/22 1205 98.4 F (36.9 C)     Temp Source 05/15/22 1205 Oral     SpO2 05/15/22 1205 96 %     Weight 05/15/22 1202 285 lb (129.3 kg)     Height 05/15/22 1202 5\' 5"  (1.651 m)     Head Circumference --      Peak Flow --      Pain Score 05/15/22 1202 10     Pain Loc --      Pain Edu? --      Excl. in GC? --     Most recent vital signs: Vitals:   05/15/22 1205 05/15/22 1400  BP: 120/66 (!) 130/116  Pulse: 81 69  Resp: 20 13  Temp: 98.4 F (36.9 C)   SpO2: 96% 97%     General: Alert and oriented, comfortable appearing. CV:  Good peripheral perfusion.  Resp:  Normal effort.  Abd:  No distention.  Other:  EOMI.  PERRLA.  No facial droop, but difficulty holding air in right cheek.  Subjective decreased sensation to right face.  Mild decreased grip strength to the right.  Otherwise all 4 extremities with 5/5 motor strength and intact sensation.  Possible mild pronator drift on the right.  No ataxia on finger-to-nose.   ED Results / Procedures /  Treatments   Labs (all labs ordered are listed, but only abnormal results are displayed) Labs Reviewed  BASIC METABOLIC PANEL - Abnormal; Notable for the following components:      Result Value   Creatinine, Ser 1.12 (*)    Calcium 10.5 (*)    GFR, Estimated 54 (*)    All other components within normal limits  CBC - Abnormal; Notable for the following components:   RDW 16.6 (*)    All other components within normal limits  URINALYSIS, ROUTINE W REFLEX MICROSCOPIC  HIV ANTIBODY (ROUTINE TESTING W REFLEX)  CBG MONITORING, ED  TROPONIN I (HIGH SENSITIVITY)     EKG  ED ECG REPORT I, 07/16/22, the attending physician, personally viewed and interpreted this ECG.  Date: 05/15/2022 EKG Time: 1207  Rate: 90 Rhythm: normal sinus rhythm QRS Axis: normal Intervals: normal ST/T Wave abnormalities: normal Narrative Interpretation: no evidence of acute ischemia    RADIOLOGY  CT head: I independently viewed and interpreted the images; there is no ICH or evidence of acute infarct   PROCEDURES:  Critical Care performed: No  Procedures  MEDICATIONS ORDERED IN ED: Medications  sodium chloride flush (NS) 0.9 % injection 3 mL (has no administration in time range)  acetaminophen (TYLENOL) tablet 650 mg (has no administration in time range)    Or  acetaminophen (TYLENOL) suppository 650 mg (has no administration in time range)  HYDROcodone-acetaminophen (NORCO/VICODIN) 5-325 MG per tablet 1-2 tablet (has no administration in time range)  morphine (PF) 2 MG/ML injection 1 mg (has no administration in time range)  bisacodyl (DULCOLAX) EC tablet 5 mg (has no administration in time range)  ondansetron (ZOFRAN) tablet 4 mg (has no administration in time range)    Or  ondansetron (ZOFRAN) injection 4 mg (has no administration in time range)  enoxaparin (LOVENOX) injection 65 mg (has no administration in time range)     IMPRESSION / MDM / ASSESSMENT AND PLAN / ED COURSE   I reviewed the triage vital signs and the nursing notes.  66 year old female with PMH as noted above presents after an episode of near syncope 3 days ago, now with persistent headache as well as possible mild right-sided weakness.  On exam the vital signs are normal.  Neuro exam is as above with some subtle right-sided findings.  I reviewed the past medical records.  The patient follows with cardiology for heart failure, and was last seen there in March.  She has no ED visits or admissions since that time.  Per the orthopedic PA discharge summary from 6/25/20201 she was admitted at that time for revision of a right total knee replacement.  Differential diagnosis includes, but is not limited to, intracranial hemorrhage, acute stroke, complex migraine, electrolyte abnormality or other metabolic disturbance.  Patient's presentation is most consistent with acute presentation with potential threat to life or bodily function.  We will obtain CT head, lab work-up, consult neurology, and reassess.  There is no indication for code stroke activation given the duration of the symptoms.  The patient is on the cardiac monitor to evaluate for evidence of arrhythmia and/or significant heart rate changes.  ----------------------------------------- 3:00 PM on 05/15/2022 -----------------------------------------  CT shows no acute findings.  I consulted Dr. Viviann Spare from neurology who agrees with the current management and recommends admission for further stroke work-up.  ----------------------------------------- 3:07 PM on 05/15/2022 -----------------------------------------  I consulted Dr. Mayford Knife from the hospitalist service; based on her discussion she agrees to admit the patient.   FINAL CLINICAL IMPRESSION(S) / ED DIAGNOSES   Final diagnoses:  Near syncope  Right sided weakness     Rx / DC Orders   ED Discharge Orders     None        Note:  This document was prepared using Dragon  voice recognition software and may include unintentional dictation errors.    Dionne Bucy, MD 05/15/22 [id number]

## 2022-05-16 ENCOUNTER — Observation Stay: Payer: Medicare HMO

## 2022-05-16 DIAGNOSIS — N1831 Chronic kidney disease, stage 3a: Secondary | ICD-10-CM | POA: Diagnosis not present

## 2022-05-16 DIAGNOSIS — R531 Weakness: Secondary | ICD-10-CM | POA: Diagnosis not present

## 2022-05-16 DIAGNOSIS — I10 Essential (primary) hypertension: Secondary | ICD-10-CM | POA: Diagnosis not present

## 2022-05-16 LAB — CBC
HCT: 38.2 % (ref 36.0–46.0)
Hemoglobin: 12 g/dL (ref 12.0–15.0)
MCH: 26.8 pg (ref 26.0–34.0)
MCHC: 31.4 g/dL (ref 30.0–36.0)
MCV: 85.3 fL (ref 80.0–100.0)
Platelets: 328 10*3/uL (ref 150–400)
RBC: 4.48 MIL/uL (ref 3.87–5.11)
RDW: 16.2 % — ABNORMAL HIGH (ref 11.5–15.5)
WBC: 7.3 10*3/uL (ref 4.0–10.5)
nRBC: 0 % (ref 0.0–0.2)

## 2022-05-16 LAB — BASIC METABOLIC PANEL
Anion gap: 7 (ref 5–15)
BUN: 15 mg/dL (ref 8–23)
CO2: 28 mmol/L (ref 22–32)
Calcium: 9.9 mg/dL (ref 8.9–10.3)
Chloride: 104 mmol/L (ref 98–111)
Creatinine, Ser: 1.07 mg/dL — ABNORMAL HIGH (ref 0.44–1.00)
GFR, Estimated: 57 mL/min — ABNORMAL LOW (ref >60)
Glucose, Bld: 93 mg/dL (ref 70–99)
Potassium: 4.3 mmol/L (ref 3.5–5.1)
Sodium: 139 mmol/L (ref 135–145)

## 2022-05-16 MED ORDER — IOHEXOL 350 MG/ML SOLN
75.0000 mL | Freq: Once | INTRAVENOUS | Status: AC | PRN
  Administered 2022-05-16: 75 mL via INTRAVENOUS

## 2022-05-16 MED ORDER — BUTALBITAL-APAP-CAFFEINE 50-325-40 MG PO TABS
2.0000 | ORAL_TABLET | Freq: Four times a day (QID) | ORAL | Status: DC | PRN
  Administered 2022-05-16: 2 via ORAL
  Filled 2022-05-16: qty 2

## 2022-05-16 NOTE — Evaluation (Signed)
Occupational Therapy Evaluation Patient Details Name: Kristine Rangel MRN: [id number] DOB: 1955-11-16 Today's Date: 05/16/2022   History of Present Illness Per chart, 66 y/o F w/ PMH of HTN, HLD, GERD, TIAs, CHF, peripheral neuropathy,CKD,  morbid obesity, COPD, asthma, OSA on CPAP who presents right sided weakness x 3 days. On saturday pt c/o an episode w/ sweating, headache, lightheadedness, nausea and right sided weakness. No of these symptoms improved over the following 2 days so pt came to the ER. Pt denies ever having above stated symptoms in the past. Pt denies any fever, chills, chest pain, vomiting, abd pain, dysuria, urinary urgency, urinary frequency, diarrhea or constipation. Pt uses a walker and lives alone but has a care giver.   Clinical Impression   Pt seen for OT evaluation this date.  Pt presents with decline in ADLs and mobility related to new onset of R sided weakness.  At baseline, pt resides alone in ground level apartment and pt has a CNA who assists 5 days a week with bathing/dressing and IADLs.  Modified indep with 4 wheeled walker in the home and supv for short distances outside her apartment.  Pt participated in bathing/dressing/grooming ADLs seated on toilet during OT session, sit to stand as needed with grab bar and 4 wheeled walker, see below for details.  Pt presents with R sided weakness, mild incoordination with gross and fine motor skills.  Pt performing basic ADLs close to baseline, but does present with R sided weakness and pt does endorse feeling weaker than her baseline, contributing to need for extra time to perform ADLs and transfers.  Will continue to follow for above noted impairments.  Would benefit from [facility] OT upon d/c.  Left up in chair with all necessary items within reach and notified nursing of pt's pain level.  See below.      Recommendations for follow up therapy are one component of a multi-disciplinary discharge planning process, led by the attending  physician.  Recommendations may be updated based on patient status, additional functional criteria and insurance authorization.   Follow Up Recommendations  Home health OT    Assistance Recommended at Discharge    Patient can return home with the following A little help with walking and/or transfers;A little help with bathing/dressing/bathroom;Assistance with cooking/housework;Help with stairs or ramp for entrance    Functional Status Assessment  Patient has had a recent decline in their functional status and demonstrates the ability to make significant improvements in function in a reasonable and predictable amount of time.  Equipment Recommendations  None recommended by OT    Recommendations for Other Services       Precautions / Restrictions Precautions Precautions: Fall Restrictions Weight Bearing Restrictions: No      Mobility Bed Mobility Overal bed mobility: Modified Independent             General bed mobility comments: modified indep with supine to sit with HOB elevated; did not test sit to supine as pt was left in recliner end of session. Patient Response: Cooperative  Transfers Overall transfer level: Needs assistance Equipment used: Rollator (4 wheels) Transfers: Sit to/from Stand Sit to Stand: Supervision           General transfer comment: set up of 4 wheeled walker; good awareness to lock brakes in prep for standing.      Balance Overall balance assessment: Needs assistance Sitting-balance support: Feet supported Sitting balance-Leahy Scale: Normal Sitting balance - Comments: performed seated ADLs on commode without concern.  Standing balance support: During functional activity, Reliant on assistive device for balance Standing balance-Leahy Scale: Fair Standing balance comment: able to remove hands from walker to stand and wash hands at sink and stand to hike brief with wide BOS.                           ADL either performed or  assessed with clinical judgement   ADL Overall ADL's : Needs assistance/impaired     Grooming: Wash/dry hands;Supervision/safety;Standing   Upper Body Bathing: Set up;Sitting Upper Body Bathing Details (indicate cue type and reason): sponge bath on commode Lower Body Bathing: Supervison/ safety;Set up;Sit to/from stand Lower Body Bathing Details (indicate cue type and reason): washed peri area while seated on commode.  Would be able to reach lower legs and feet but pt just wanted to wash peri area. Upper Body Dressing : Set up;Sitting Upper Body Dressing Details (indicate cue type and reason): hospital gown change Lower Body Dressing: Minimal assistance;Sit to/from stand Lower Body Dressing Details (indicate cue type and reason): threaded new brief sitting on lower commode; min A to hike in standing. Toilet Transfer: Supervision/safety;Rolling walker (2 wheels);Grab Pharmacist, community and Hygiene: Supervision/safety;Set up;Sitting/lateral lean       Functional mobility during ADLs: Supervision/safety;Rolling walker (2 wheels) General ADL Comments: ambulatory in room with her 4 wheeled walker from home with supv.     Vision Patient Visual Report: No change from baseline Additional Comments: Pt stated over the last few days vision had been blurry but no longer an issue     Perception     Praxis      Pertinent Vitals/Pain Pain Assessment Pain Assessment: 0-10 Pain Score: 8  Pain Location: R side of head, neck Pain Descriptors / Indicators: Aching, Headache Pain Intervention(s): Limited activity within patient's tolerance, Monitored during session     Hand Dominance Right   Extremity/Trunk Assessment Upper Extremity Assessment Upper Extremity Assessment: RUE deficits/detail RUE Deficits / Details: R shoulder 3+, elbow flex/ext 4-, R grip fair compared to L grip good.  Pt states hx of bilat shoulder replacement. RUE Coordination:  decreased fine motor;decreased gross motor (mild incoordination with digit opposition and finger to nose)   Lower Extremity Assessment Lower Extremity Assessment: Defer to PT evaluation   Cervical / Trunk Assessment Cervical / Trunk Assessment: Normal   Communication Communication Communication: No difficulties   Cognition Arousal/Alertness: Awake/alert Behavior During Therapy: WFL for tasks assessed/performed Overall Cognitive Status: Within Functional Limits for tasks assessed                                 General Comments: A&O x4     General Comments       Exercises Other Exercises Other Exercises: Educ on role of OT, goals   Shoulder Instructions      Home Living Family/patient expects to be discharged to:: Private residence Living Arrangements: Alone Available Help at Discharge: Personal care attendant Type of Home: Apartment Home Access: Level entry     Home Layout: One level     Bathroom Shower/Tub: Chief Strategy Officer: Handicapped height (toilet riser and grab bar)     Home Equipment: Rollator (4 wheels);Rolling Walker (2 wheels);BSC/3in1;Tub bench;Toilet riser;Grab bars - tub/shower;Grab bars - toilet          Prior Functioning/Environment Prior Level of Function : Needs assist (see  below)       Physical Assist : ADLs (physical);Mobility (physical)     Mobility Comments: ambulatory short distances with 4 wheeled walker outside apartment with supv from CNA, modified indep within apartment. ADLs Comments: CNA 5 days a week who assists with bathing/dressing and IADLs.  On days CNA doesn't come, pt states she avoids the shower and just takes extra time with dressing.        OT Problem List: Decreased strength;Decreased coordination;Obesity;Decreased activity tolerance;Impaired balance (sitting and/or standing);Pain      OT Treatment/Interventions: Self-care/ADL training;Therapeutic exercise;Patient/family  education;Neuromuscular education;Balance training;Therapeutic activities    OT Goals(Current goals can be found in the care plan section) Acute Rehab OT Goals Patient Stated Goal: Improve strength OT Goal Formulation: With patient Time For Goal Achievement: 05/30/22 Potential to Achieve Goals: Good  OT Frequency: Min 2X/week                  AM-PAC OT "6 Clicks" Daily Activity     Outcome Measure Help from another person eating meals?: None Help from another person taking care of personal grooming?: A Little Help from another person toileting, which includes using toliet, bedpan, or urinal?: A Little Help from another person bathing (including washing, rinsing, drying)?: A Little Help from another person to put on and taking off regular upper body clothing?: None Help from another person to put on and taking off regular lower body clothing?: A Little 6 Click Score: 20   End of Session Equipment Utilized During Treatment: Gait belt;Rollator (4 wheels) Nurse Communication: Mobility status  Activity Tolerance: Patient tolerated treatment well Patient left: in chair;with call bell/phone within reach  OT Visit Diagnosis: Muscle weakness (generalized) (M62.81);Pain Pain - Right/Left: Right Pain - part of body:  (R side of head and neck)                Time: 4709-6283 OT Time Calculation (min): 33 min Charges:  OT General Charges $OT Visit: 1 Visit OT Evaluation $OT Eval Low Complexity: 1 Low OT Treatments $Self Care/Home Management : 8-22 mins  Danelle Earthly, MS, OTR/L   Otis Dials 05/16/2022, 5:25 PM

## 2022-05-16 NOTE — Progress Notes (Addendum)
PROGRESS NOTE   66 y/o F w/ PMH of HTN, HLD, GERD, TIAs, CHF, peripheral neuropathy,CKD,  morbid obesity, COPD, asthma, OSA on CPAP who presents right sided weakness x 3 days. On saturday pt c/o an episode w/ sweating, headache, lightheadedness, nausea and right sided weakness. No of these symptoms improved over the following 2 days so pt came to the ER. Pt denies ever having above stated symptoms in the past. Pt denies any fever, chills, chest pain, vomiting, abd pain, dysuria, urinary urgency, urinary frequency, diarrhea or constipation. Pt uses a walker and lives alone but has a care giver.    Kristine Rangel  [id number] DOB: 1956-05-23 DOA: 05/15/2022 PCP: Marguerita Merles, MD    Assessment & Plan:   Principal Problem:   Right sided weakness Active Problems:   Benign essential hypertension   Morbid obesity (HCC)   GERD (gastroesophageal reflux disease)   Peripheral neuropathy   HLD (hyperlipidemia)   OSA on CPAP   Chronic diastolic CHF (congestive heart failure) (HCC)   Asthma, chronic   Chronic kidney disease, stage 3a (HCC)  Assessment and Plan:  Right sided weakness: etiology unclear. CVA r/o. CT head shows no acute intracranial abnormalities. 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 Continue on home dose of aspirin. CTA head and neck shows neg for large vessel occlusion. Difficult to exclude a small chronic AVM in the right middle cranial fossa assoc w/ right MCA. Will f/u outpatient w/ neuro to monitor CTA findings. PT/OT ordered    HLD: continue on statin    HTN: continue on coreg, imdur. Can restart valsartan if Cr stable/trending down     CKD: baseline Cr/GFR is unknown, currently stage IIIa. Can restart ARB tomorrow if Cr stable/trending down    GERD: continue on PPI    Peripheral neuropathy: continue on home dose of duloxetine, gabapentin    Morbidly obese: BMI 47.4. Complicates overall care & prognosis    OSA: on CPAP    Hx of asthma & COPD:  moderate persistent. W/o acute exacerbation. Continue on bronchodilators.    Chronic diastolic CHF: continue on lasix at reduce dose of 40 mg daily instead of BID, coreg. Monitor I/Os.      DVT prophylaxis: lovenox  Code Status: full  Family Communication:  Disposition Plan: likely d/c home tomorrow   Level of care: Telemetry Medical  Status is: Observation The patient remains OBS appropriate and will d/c before 2 midnights.   Consultants:  Neuro   Procedures:   Antimicrobials:   Subjective: Pt c/o malaise   Objective: Vitals:   05/16/22 0029 05/16/22 0526 05/16/22 0920 05/16/22 1226  BP: 126/62 116/60 (!) 124/56 124/74  Pulse: 80 79 71 67  Resp: 18 16 18 16   Temp: 97.9 F (36.6 C) 97.8 F (36.6 C) 97.6 F (36.4 C) 97.8 F (36.6 C)  TempSrc:      SpO2: 97% 95% 100% 100%  Weight:      Height:        Intake/Output Summary (Last 24 hours) at 05/16/2022 1425 Last data filed at 05/16/2022 1300 Gross per 24 hour  Intake 240 ml  Output 1500 ml  Net -1260 ml   Filed Weights   05/15/22 1202  Weight: 129.3 kg    Examination:  General exam: Appears calm but uncomfortable   Respiratory system: clear breath sounds b/l  Cardiovascular system: S1 & S2 +. No  rubs, gallops or clicks.  Gastrointestinal system: Abdomen  is nondistended, soft and nontender. Normal bowel sounds heard. Central nervous system: Alert and oriented. Moves all extremities  Psychiatry: Judgement and insight appear normal. Flat mood and affect    Data Reviewed: I have personally reviewed following labs and imaging studies  CBC: Recent Labs  Lab 05/15/22 1206 05/16/22 0924  WBC 5.3 7.3  HGB 12.8 12.0  HCT 40.9 38.2  MCV 83.6 85.3  PLT 351 [id number]   Basic Metabolic Panel: Recent Labs  Lab 05/15/22 1206 05/16/22 0924  NA 140 139  K 4.1 4.3  CL 106 104  CO2 25 28  GLUCOSE 96 93  BUN 19 15  CREATININE 1.12* 1.07*  CALCIUM 10.5* 9.9   GFR: Estimated Creatinine Clearance: 68.2  mL/min (A) (by C-G formula based on SCr of 1.07 mg/dL (H)). Liver Function Tests: No results for input(s): "AST", "ALT", "ALKPHOS", "BILITOT", "PROT", "ALBUMIN" in the last 168 hours. No results for input(s): "LIPASE", "AMYLASE" in the last 168 hours. No results for input(s): "AMMONIA" in the last 168 hours. Coagulation Profile: No results for input(s): "INR", "PROTIME" in the last 168 hours. Cardiac Enzymes: No results for input(s): "CKTOTAL", "CKMB", "CKMBINDEX", "TROPONINI" in the last 168 hours. BNP (last 3 results) No results for input(s): "PROBNP" in the last 8760 hours. HbA1C: No results for input(s): "HGBA1C" in the last 72 hours. CBG: No results for input(s): "GLUCAP" in the last 168 hours. Lipid Profile: No results for input(s): "CHOL", "HDL", "LDLCALC", "TRIG", "CHOLHDL", "LDLDIRECT" in the last 72 hours. Thyroid Function Tests: No results for input(s): "TSH", "T4TOTAL", "FREET4", "T3FREE", "THYROIDAB" in the last 72 hours. Anemia Panel: No results for input(s): "VITAMINB12", "FOLATE", "FERRITIN", "TIBC", "IRON", "RETICCTPCT" in the last 72 hours. Sepsis Labs: No results for input(s): "PROCALCITON", "LATICACIDVEN" in the last 168 hours.  No results found for this or any previous visit (from the past 240 hour(s)).       Radiology Studies: MR BRAIN WO CONTRAST  Result Date: 05/16/2022 CLINICAL DATA:  66 year old female. Syncope, diaphoresis, headache, numbness and weakness. EXAM: MRI HEAD WITHOUT CONTRAST TECHNIQUE: Multiplanar, multiecho pulse sequences of the brain and surrounding structures were obtained without intravenous contrast. COMPARISON:  CTA head and neck today reported separately. Brain MRI 04/08/2016. FINDINGS: Brain: No restricted diffusion to suggest acute infarction. No midline shift, mass effect, evidence of mass lesion, ventriculomegaly, extra-axial collection or acute intracranial hemorrhage. Cervicomedullary junction and pituitary are within normal  limits. Stable gray and white matter signal since the 2017 MRI, largely normal for age. Minimal nonspecific white matter T2 and FLAIR hyperintensity (right frontal lobe series 15, image 32). There is evidence of a small chronic lacunar infarct in the left brainstem near midline on series 10, image 6. And small chronic infarcts also in the posterior right cerebellum (image 7). No cortical encephalomalacia or chronic cerebral blood products identified. Vascular: Major intracranial vascular flow voids are stable since the 2017 MRI, see CTA details today. Skull and upper cervical spine: Negative visible cervical spine. Hyperostosis of the calvarium. Visualized bone marrow signal is within normal limits. Sinuses/Orbits: Negative orbits. Paranasal Visualized paranasal sinuses and mastoids are clear. Other: Visible internal auditory structures appear normal. Negative visible scalp and face. IMPRESSION: 1. No acute intracranial abnormality. 2. 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 Stable noncontrast MRI appearance of the brain since 2017. 3. See also CTA Head and Neck findings today regarding the Basilar artery and Right MCA. Electronically Signed   By: Genevie Ann M.D.   On: 05/16/2022 10:59  CT ANGIO HEAD NECK W WO CM  Result Date: 05/16/2022 CLINICAL DATA:  66 year old female with near syncope, diaphoresis. Headache. Right side numbness and weakness. History of posterior fossa aneurysm coil embolization. EXAM: CT ANGIOGRAPHY HEAD AND NECK TECHNIQUE: Multidetector CT imaging of the head and neck was performed using the standard protocol during bolus administration of intravenous contrast. Multiplanar CT image reconstructions and MIPs were obtained to evaluate the vascular anatomy. Carotid stenosis measurements (when applicable) are obtained utilizing NASCET criteria, using the distal internal carotid diameter as the denominator. RADIATION DOSE REDUCTION: This exam was performed according to  the departmental dose-optimization program which includes automated exposure control, adjustment of the mA and/or kV according to patient size and/or use of iterative reconstruction technique. CONTRAST:  59mL OMNIPAQUE IOHEXOL 350 MG/ML SOLN COMPARISON:  Plain head CT yesterday.  Brain MRI 04/08/2016. FINDINGS: CT HEAD Brain: Chronic metallic embolization coil pack in the right prepontine/pre medullary cistern with streak artifact. Cerebral volume remains normal. Incidental bulky chronic dural calcifications. No midline shift, ventriculomegaly, mass effect, evidence of mass lesion, intracranial hemorrhage or evidence of cortically based acute infarction. Gray-white matter differentiation is within normal limits throughout the brain. Calvarium and skull base: Hyperostosis of the calvarium. No acute osseous abnormality identified. Paranasal sinuses: Visualized paranasal sinuses and mastoids are clear. Orbits: No acute orbit or scalp soft tissue finding. CTA NECK Skeleton: Absent maxillary dentition. Straightening of cervical lordosis with multilevel cervical disc and endplate degeneration. No acute osseous abnormality identified. Upper chest: Negative. Other neck: Negative. Aortic arch: 3 vessel arch configuration.  No arch atherosclerosis. Right carotid system: Mildly tortuous brachiocephalic artery and proximal right CCA without plaque or stenosis. Negative right carotid bifurcation. Mildly tortuous right ICA with no significant plaque or stenosis. Left carotid system: Similar tortuosity. No significant plaque or stenosis. Vertebral arteries: Normal proximal right subclavian artery and right vertebral artery origin. Dominant right vertebral artery is patent to the skull base with no plaque or stenosis. Negative proximal left subclavian artery. Non dominant left vertebral artery is patent to the skull base with no plaque or stenosis. CTA HEAD Posterior circulation: Dominant right V4 segment. Non dominant left  vertebral artery functionally terminates in PICA, with a small V4 segment continuing to the vertebrobasilar junction. Normal right PICA origin. No distal right vertebral stenosis. Tortuous and somewhat diminutive basilar artery passes just across the top of the aneurysm coil pack in the basilar cistern (series 11, image 128) with no obvious aneurysmal filling. Distal basilar is diminutive but patent. Patent SCA and PCA origins. Both PCAs are diminutive but bilateral PCA branches appear patent without stenosis. Anterior circulation: Both ICA siphons are patent. No left siphon plaque or stenosis. Right siphon mild calcified plaque but no significant stenosis. Patent carotid termini, MCA and ACA origins. Dominant left and diminutive right ACA A1 segments. Normal anterior communicating artery. Bilateral ACA branches are within normal limits. Left MCA M1 segment and bifurcation are patent without stenosis. Right MCA M1 segment appears to bifurcate early, in an area of tortuous sphenoid palatine venous structures (series 10, image 112). And tortuous vessels were demonstrated here also on the 2017 MRI (noncontrast). No MCA branch occlusion or stenosis is identified. No other abnormal vasculature identified. Venous sinuses: Early contrast timing, not well evaluated. Anatomic variants: Dominant right vertebral artery, the left functionally terminates in PICA. Review of the MIP images confirms the above findings IMPRESSION: 1. Negative for large vessel occlusion. No significant atherosclerotic stenosis in the head or neck. 2. 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 No adverse features are identified. 3. No patent intracranial aneurysm identified. 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 4. No acute intracranial abnormality. Stable and otherwise negative CT appearance of the brain.  Electronically Signed   By: Odessa Fleming M.D.   On: 05/16/2022 10:27   CT Head Wo Contrast  Result Date: 05/15/2022 CLINICAL DATA:  Syncope.  Neuro deficit.  Headache. EXAM: CT HEAD WITHOUT CONTRAST TECHNIQUE: Contiguous axial images were obtained from the base of the skull through the vertex without intravenous contrast. RADIATION DOSE REDUCTION: This exam was performed according to the departmental dose-optimization program which includes automated exposure control, adjustment of the mA and/or kV according to patient size and/or use of iterative reconstruction technique. COMPARISON:  04/08/2016 FINDINGS: Brain: No evidence of acute infarction, hemorrhage, hydrocephalus, extra-axial collection or mass lesion/mass effect. Vascular: No hyperdense vessel or unexpected calcification. Metallic embolization coils are identified in the region of the basilar artery. Skull: Normal. Negative for fracture or focal lesion. Sinuses/Orbits: No acute finding. Other: None IMPRESSION: 1. No acute intracranial abnormalities. Electronically Signed   By: Signa Kell M.D.   On: 05/15/2022 14:42        Scheduled Meds:  aspirin EC  81 mg Oral Daily   carvedilol  6.25 mg Oral BID WC   DULoxetine  60 mg Oral QHS   enoxaparin (LOVENOX) injection  0.5 mg/kg Subcutaneous Q24H   fluticasone furoate-vilanterol  1 puff Inhalation Daily   furosemide  40 mg Oral Daily   gabapentin  300 mg Oral TID   isosorbide mononitrate  90 mg Oral Daily   latanoprost  1 drop Both Eyes QHS   pantoprazole  40 mg Oral Daily   sodium chloride flush  3 mL Intravenous Q12H   Continuous Infusions:   LOS: 0 days    Time spent: 25 mins     Charise Killian, MD [facility] Pager [phone]  If 7PM-7AM, please contact night-coverage [email or web address] 05/16/2022, 2:25 PM

## 2022-05-16 NOTE — Plan of Care (Signed)
MRI neg for CVA. CTA with contrast shows coiled aneurysm unchanged, possible AVM which can be monitored with serial scans outpt Neurology. Con't asa 81mg  and statin.  Discussed with Dr. .

## 2022-05-16 NOTE — Progress Notes (Signed)
Patient to CT/MRI in stable condition via bed.

## 2022-05-17 ENCOUNTER — Observation Stay (HOSPITAL_BASED_OUTPATIENT_CLINIC_OR_DEPARTMENT_OTHER)
Admit: 2022-05-17 | Discharge: 2022-05-17 | Disposition: A | Discharge status: Home / Self Care | Payer: Medicare HMO | Attending: Internal Medicine | Admitting: Internal Medicine

## 2022-05-17 ENCOUNTER — Observation Stay: Payer: Medicare HMO

## 2022-05-17 ENCOUNTER — Encounter: Payer: Self-pay | Admitting: Internal Medicine

## 2022-05-17 DIAGNOSIS — R55 Syncope and collapse: Secondary | ICD-10-CM

## 2022-05-17 DIAGNOSIS — R531 Weakness: Secondary | ICD-10-CM | POA: Diagnosis not present

## 2022-05-17 LAB — BASIC METABOLIC PANEL
Anion gap: 6 (ref 5–15)
BUN: 16 mg/dL (ref 8–23)
CO2: 30 mmol/L (ref 22–32)
Calcium: 10.3 mg/dL (ref 8.9–10.3)
Chloride: 105 mmol/L (ref 98–111)
Creatinine, Ser: 1.16 mg/dL — ABNORMAL HIGH (ref 0.44–1.00)
GFR, Estimated: 51 mL/min — ABNORMAL LOW (ref >60)
Glucose, Bld: 93 mg/dL (ref 70–99)
Potassium: 4.7 mmol/L (ref 3.5–5.1)
Sodium: 141 mmol/L (ref 135–145)

## 2022-05-17 LAB — ECHOCARDIOGRAM COMPLETE
AV Mean grad: 2 mmHg
AV Peak grad: 4 mmHg
Ao pk vel: 1 m/s
Area-P 1/2: 1.97 cm2
Height: 65 in
Weight: 4560 oz

## 2022-05-17 LAB — LIPID PANEL
Cholesterol: 155 mg/dL (ref 0–200)
HDL: 53 mg/dL (ref >40)
LDL Cholesterol: 81 mg/dL (ref 0–99)
Total CHOL/HDL Ratio: 2.9 RATIO
Triglycerides: 103 mg/dL (ref <150)
VLDL: 21 mg/dL (ref 0–40)

## 2022-05-17 LAB — CBC
HCT: 39.7 % (ref 36.0–46.0)
Hemoglobin: 12.3 g/dL (ref 12.0–15.0)
MCH: 26.1 pg (ref 26.0–34.0)
MCHC: 31 g/dL (ref 30.0–36.0)
MCV: 84.3 fL (ref 80.0–100.0)
Platelets: 342 10*3/uL (ref 150–400)
RBC: 4.71 MIL/uL (ref 3.87–5.11)
RDW: 16.3 % — ABNORMAL HIGH (ref 11.5–15.5)
WBC: 4.6 10*3/uL (ref 4.0–10.5)
nRBC: 0 % (ref 0.0–0.2)

## 2022-05-17 LAB — GLUCOSE, CAPILLARY: Glucose-Capillary: 103 mg/dL — ABNORMAL HIGH (ref 70–99)

## 2022-05-17 MED ORDER — ATORVASTATIN CALCIUM 20 MG PO TABS
40.0000 mg | ORAL_TABLET | Freq: Every day | ORAL | Status: DC
  Administered 2022-05-17: 40 mg via ORAL
  Filled 2022-05-17: qty 2

## 2022-05-17 MED ORDER — MONTELUKAST SODIUM 10 MG PO TABS
10.0000 mg | ORAL_TABLET | Freq: Every day | ORAL | Status: DC
  Administered 2022-05-17: 10 mg via ORAL
  Filled 2022-05-17: qty 1

## 2022-05-17 MED ORDER — ZOLPIDEM TARTRATE 5 MG PO TABS
5.0000 mg | ORAL_TABLET | Freq: Every evening | ORAL | Status: DC | PRN
Start: 2022-05-17 — End: 2022-05-18

## 2022-05-17 MED ORDER — LORATADINE 10 MG PO TABS
10.0000 mg | ORAL_TABLET | Freq: Every day | ORAL | Status: DC
  Administered 2022-05-18: 10 mg via ORAL
  Filled 2022-05-17: qty 1

## 2022-05-17 MED ORDER — ALBUTEROL SULFATE (2.5 MG/3ML) 0.083% IN NEBU: 2.5000 mg | INHALATION_SOLUTION | Freq: Four times a day (QID) | RESPIRATORY_TRACT | Status: DC | PRN

## 2022-05-17 MED ORDER — HYDROCODONE-ACETAMINOPHEN 5-325 MG PO TABS: 1.0000 | ORAL_TABLET | Freq: Four times a day (QID) | ORAL | Status: DC | PRN

## 2022-05-17 MED ORDER — VITAMIN D 25 MCG (1000 UNIT) PO TABS
1000.0000 [IU] | ORAL_TABLET | Freq: Every day | ORAL | Status: DC
  Administered 2022-05-18: 1000 [IU] via ORAL
  Filled 2022-05-17: qty 1

## 2022-05-17 MED ORDER — EPINEPHRINE 0.3 MG/0.3ML IJ SOAJ: 0.3000 mg | INTRAMUSCULAR | Status: DC | PRN

## 2022-05-17 NOTE — Evaluation (Signed)
Physical Therapy Evaluation Patient Details Name: Kristine Rangel MRN: [id number] DOB: 04/30/1956 Today's Date: 05/17/2022  History of Present Illness  Per chart, 66 y/o F w/ PMH of HTN, HLD, GERD, TIAs, CHF, peripheral neuropathy,CKD,  morbid obesity, COPD, asthma, OSA on CPAP who presents right sided weakness x 3 days. On saturday pt c/o an episode w/ sweating, headache, lightheadedness, nausea and right sided weakness. No of these symptoms improved over the following 2 days so pt came to the ER. Pt denies ever having above stated symptoms in the past. Pt denies any fever, chills, chest pain, vomiting, abd pain, dysuria, urinary urgency, urinary frequency, diarrhea or constipation. Pt uses a walker and lives alone but has a care giver.  Imaging negative for acute neurological insult; significant for previous coiling of basilar aneurysm  Clinical Impression  Patient resting in bed upon arrival to room; alert and oriented, follows commands and agreeable to participation with session.  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  Denies pain at this time.  Endorses persistent weakness to R UE > L E, but improved since initial assessment.    R UE grossly 4-/5 throughout (moderate use of accessory musculature to elevate UE) with mild/mod coordination and fine motor deficits; R LE grossly 4-/5 throughout, increased time/effort required to activate and sustain isolated muscle activation.  Denies sensory deficit throughout. Able to complete bed mobility with mod indep; sit/stand, basic transfers and gait (25') with personal 4WRW, cga/min assist.  Demonstrates mild forward trunk flexion with partially reciprocal stepping pattern; broad BOS with limited heel strike/toe off (likely baseline for her); mildly excessive sway bilat (due to body habitus). No overt buckling or LOB.  Did report acute onset of lightheadedness, blurred vision into distance; unable to continue distance, requiring immediate return to recliner for recovery. RN  immediately called to room for assessment, noting change in responsiveness, increased facial droop and dysarthria, increased R-sided weakness during event.  Vitals, FSBS stable and WFL.  Patient remains oriented to self and able to respond, but generally sluggish; endorsing prolonged feeling "like I'm going to pass out".  Patient dependently assisted back to bed for full assessment.  CODE STROKE called and medical team at bedside for continued assessment. Would benefit from skilled PT to address above deficits and promote optimal return to PLOF.; discharge recommendations deferred due to events of session.  Will re-assess and make appropriate recommendations in subsequent sessions once work up complete and patient medically stable.    Recommendations for follow up therapy are one component of a multi-disciplinary discharge planning process, led by the attending physician.  Recommendations may be updated based on patient status, additional functional criteria and insurance authorization.  Follow Up Recommendations  (TBD upon reassessment of status/mobility)      Assistance Recommended at Discharge    Patient can return home with the following       Equipment Recommendations    Recommendations for Other Services       Functional Status Assessment Patient has had a recent decline in their functional status and demonstrates the ability to make significant improvements in function in a reasonable and predictable amount of time.     Precautions / Restrictions Precautions Precautions: Fall Restrictions Weight Bearing Restrictions: No      Mobility  Bed Mobility Overal bed mobility: Modified Independent                  Transfers Overall transfer level: Needs assistance Equipment used: Rolling walker (2 wheels) Transfers: Sit to/from Stand Sit  to Stand: Min guard           General transfer comment: set up of 4 wheeled walker; good awareness to lock brakes in prep for  standing; mild use of momentum and UE support to complete    Ambulation/Gait Ambulation/Gait assistance: Min guard, Min assist Gait Distance (Feet): 25 Feet Assistive device: Rollator (4 wheels)         General Gait Details: mild forward trunk flexion with partially reciprocal stepping pattern; broad BOS with limited heel strike/toe off (likely baseline for her); mildly excessive sway bilat (due to body habitus). No overt buckling or LOB.  Did report acute onset of lightheadedness, blurred vision into distance; unable to continue distance, requiring immediate return to recliner for recovery.  Stairs            Wheelchair Mobility    Modified Rankin (Stroke Patients Only)       Balance Overall balance assessment: Needs assistance Sitting-balance support: No upper extremity supported, Feet supported Sitting balance-Leahy Scale: Good     Standing balance support: Bilateral upper extremity supported Standing balance-Leahy Scale: Fair                               Pertinent Vitals/Pain Pain Assessment Pain Assessment: No/denies pain    Home Living Family/patient expects to be discharged to:: Private residence Living Arrangements: Alone Available Help at Discharge: Personal care attendant (5 days a week, several hours a day (divided between AM/PM)) Type of Home: Apartment Home Access: Level entry       Home Layout: One level Home Equipment: Rollator (4 wheels);Rolling Walker (2 wheels);BSC/3in1;Tub bench;Toilet riser;Grab bars - tub/shower;Grab bars - toilet      Prior Function Prior Level of Function : Needs assist             Mobility Comments: Ambulatory short distances with 4 wheeled walker outside apartment with supv from CNA, modified indep within apartment; denies fall history ADLs Comments: CNA 5 days a week who assists with bathing/dressing and IADLs.  On days CNA doesn't come, pt states she avoids the shower and just takes extra time with  dressing.     Hand Dominance   Dominant Hand: Right    Extremity/Trunk Assessment   Upper Extremity Assessment Upper Extremity Assessment:  (Bilat shoulder elevation to approx shoulder height, increased use of accessory muscles on R.  R UE with mild weakness throughout, grossly 4-/5; mild/mod deficit in fine motor control/coordination.  Denies sensory deficit)    Lower Extremity Assessment Lower Extremity Assessment:  (R LE grossly 4-/5 throughout, generally delayed in activation and overall control; L LE grossly 4+ to 5/5 throughout.  Denies sensory deficit)       Communication   Communication: No difficulties  Cognition Arousal/Alertness: Awake/alert Behavior During Therapy: WFL for tasks assessed/performed Overall Cognitive Status: Within Functional Limits for tasks assessed                                          General Comments      Exercises     Assessment/Plan    PT Assessment Patient needs continued PT services  PT Problem List Decreased strength;Decreased range of motion;Decreased activity tolerance;Decreased balance;Decreased mobility;Decreased coordination;Decreased cognition;Decreased knowledge of use of DME;Decreased safety awareness;Decreased knowledge of precautions       PT Treatment Interventions DME instruction;Gait  training;Functional mobility training;Balance training;Therapeutic activities;Therapeutic exercise;Neuromuscular re-education;Patient/family education    PT Goals (Current goals can be found in the Care Plan section)  Acute Rehab PT Goals Patient Stated Goal: to get home when I'm ready PT Goal Formulation: With patient Time For Goal Achievement: 05/31/22 Potential to Achieve Goals: Good    Frequency 7X/week     Co-evaluation               AM-PAC PT "6 Clicks" Mobility  Outcome Measure Help needed turning from your back to your side while in a flat bed without using bedrails?: None Help needed moving from  lying on your back to sitting on the side of a flat bed without using bedrails?: None Help needed moving to and from a bed to a chair (including a wheelchair)?: A Little Help needed standing up from a chair using your arms (e.g., wheelchair or bedside chair)?: A Little Help needed to walk in hospital room?: A Little Help needed climbing 3-5 steps with a railing? : A Lot 6 Click Score: 19    End of Session Equipment Utilized During Treatment: Gait belt Activity Tolerance: Treatment limited secondary to medical complications (Comment) Patient left: in bed;with call bell/phone within reach;with nursing/sitter in room Nurse Communication: Mobility status PT Visit Diagnosis: Muscle weakness (generalized) (M62.81);Difficulty in walking, not elsewhere classified (R26.2);Hemiplegia and hemiparesis Hemiplegia - Right/Left: Right Hemiplegia - dominant/non-dominant: Dominant Hemiplegia - caused by: Unspecified    Time: 0900-0950 PT Time Calculation (min) (ACUTE ONLY): 50 min   Charges:   PT Evaluation $PT Eval Moderate Complexity: 1 Mod PT Treatments $Therapeutic Activity: 8-22 mins        Pericles Carmicheal H. Manson Passey, PT, DPT, NCS 05/17/22, 11:03 AM [phone]

## 2022-05-17 NOTE — Significant Event (Signed)
Rapid Response Event Note   Reason for Call :   Code Stroke Initial Focused Assessment:   Per bedside RN- patient had worked with PT and patient suddenly had right sided weakness- and slurred speech.  Vitals stable and blood sugar 103.     Interventions:  Patient sent to CT for head scan.  Plan of Care:  Neurology at bedside and stated that patient can transfer back to her room and to monitor patient and do q2h neuro checks for 4 hrs then patient can go back to q4h checks.   Event Summary:   MD Notified: Dr. Viviann Spare Call Time:0950 Arrival Time:1100 End [id number]  Vincente Poli, RN

## 2022-05-17 NOTE — Progress Notes (Addendum)
Progress Note    Kristine Rangel  [id number] DOB: 1956/11/05  DOA: 05/15/2022 PCP: Marguerita Merles, MD      Brief Narrative:    Medical records reviewed and are as summarized below:  Kristine Rangel is a 67 y.o. female with medical history significant for hypertension, hyperlipidemia, GERD, TIA, CHF, peripheral neuropathy, CKD probable stage IIIa, morbid obesity, COPD, asthma, OSA on CPAP.  She presented to the hospital with right-sided weakness for about 3 days duration.  He also had a headache, lightheadedness, nausea and sweating.  CT head and MRI brain did not show any acute abnormality of stroke.  Assessment/Plan:   Principal Problem:   Right sided weakness Active Problems:   Benign essential hypertension   Morbid obesity (HCC)   GERD (gastroesophageal reflux disease)   Peripheral neuropathy   HLD (hyperlipidemia)   OSA on CPAP   Chronic diastolic CHF (congestive heart failure) (HCC)   Asthma, chronic   Chronic kidney disease, stage 3a (HCC)    Body mass index is 47.43 kg/m.  (Morbid obesity)   Right-sided weakness, presyncope, unclear etiology.  CT head and MRI brain did not show any acute stroke.  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 Continue aspirin and Lipitor.  No acute abnormality noted on repeat CT head done on 05/17/2022.  Hypertension: Continue antihypertensive  COPD, asthma, no acute issues.  Continue bronchodilators  Chronic diastolic CHF: Compensated.  Continue Lasix  Other comorbidities include CKD stage IIIa, peripheral neuropathy, OSA on CPAP,  Diet Order             Diet Heart Room service appropriate? Yes; Fluid consistency: Thin  Diet effective now                            Consultants: Neurologist  Procedures: None    Medications:    aspirin EC  81 mg Oral Daily   atorvastatin  40  mg Oral QHS   carvedilol  6.25 mg Oral BID WC   [START ON 05/18/2022] cholecalciferol  1,000 Units Oral Daily   DULoxetine  60 mg Oral QHS   enoxaparin (LOVENOX) injection  0.5 mg/kg Subcutaneous Q24H   fluticasone furoate-vilanterol  1 puff Inhalation Daily   furosemide  40 mg Oral Daily   gabapentin  300 mg Oral TID   isosorbide mononitrate  90 mg Oral Daily   latanoprost  1 drop Both Eyes QHS   [START ON 05/18/2022] loratadine  10 mg Oral Daily   montelukast  10 mg Oral QHS   pantoprazole  40 mg Oral Daily   sodium chloride flush  3 mL Intravenous Q12H   Continuous Infusions:   Anti-infectives (From admission, onward)    None              Family Communication/Anticipated D/C date and plan/Code Status   DVT prophylaxis: SCDs Start: 05/15/22 1505     Code Status: Full Code  Family Communication: None Disposition Plan: Plan to discharge home tomorrow   Status is: Observation The patient will require care spanning > 2 midnights and should be moved to inpatient because: Right-sided weakness, code stroke activation       Subjective:   Interval events noted.  Code stroke was activated this morning because patient was found to have slurred speech, near syncope,  right upper extremity weakness and right facial droop.  Dysarthria for which she was working with PT.  At time of my visit, patient was nearly back to baseline.  Geni Bers, RN, was at the bedside   Objective:    Vitals:   05/17/22 0444 05/17/22 0754 05/17/22 0953 05/17/22 1033  BP: 114/88 126/60 138/69 100/83  Pulse: 67 63 65 65  Resp: 18 16    Temp:  98.4 F (36.9 C)    TempSrc:      SpO2: 100% 100% 100% 100%  Weight:      Height:       No data found.   Intake/Output Summary (Last 24 hours) at 05/17/2022 1609 Last data filed at 05/17/2022 1408 Gross per 24 hour  Intake 240 ml  Output 1700 ml  Net -1460 ml   Filed Weights   05/15/22 1202  Weight: 129.3 kg    Exam:  GEN: NAD SKIN:  Warm and dry EYES: EOMI ENT: MMM CV: RRR PULM: CTA B ABD: soft, obese, NT, +BS CNS: AAO x 3, non focal EXT: No edema or tenderness        Data Reviewed:   I have personally reviewed following labs and imaging studies:  Labs: Labs show the following:   Basic Metabolic Panel: Recent Labs  Lab 05/15/22 1206 05/16/22 0924 05/17/22 0405  NA 140 139 141  K 4.1 4.3 4.7  CL 106 104 105  CO2 25 28 30   GLUCOSE 96 93 93  BUN 19 15 16   CREATININE 1.12* 1.07* 1.16*  CALCIUM 10.5* 9.9 10.3   GFR Estimated Creatinine Clearance: 62.9 mL/min (A) (by C-G formula based on SCr of 1.16 mg/dL (H)). Liver Function Tests: No results for input(s): "AST", "ALT", "ALKPHOS", "BILITOT", "PROT", "ALBUMIN" in the last 168 hours. No results for input(s): "LIPASE", "AMYLASE" in the last 168 hours. No results for input(s): "AMMONIA" in the last 168 hours. Coagulation profile No results for input(s): "INR", "PROTIME" in the last 168 hours.  CBC: Recent Labs  Lab 05/15/22 1206 05/16/22 0924 05/17/22 0405  WBC 5.3 7.3 4.6  HGB 12.8 12.0 12.3  HCT 40.9 38.2 39.7  MCV 83.6 85.3 84.3  PLT 351 328 342   Cardiac Enzymes: No results for input(s): "CKTOTAL", "CKMB", "CKMBINDEX", "TROPONINI" in the last 168 hours. BNP (last 3 results) No results for input(s): "PROBNP" in the last 8760 hours. CBG: Recent Labs  Lab 05/17/22 0957  GLUCAP 103*   D-Dimer: No results for input(s): "DDIMER" in the last 72 hours. Hgb A1c: No results for input(s): "HGBA1C" in the last 72 hours. Lipid Profile: Recent Labs    05/17/22 0405  CHOL 155  HDL 53  LDLCALC 81  TRIG 103  CHOLHDL 2.9   Thyroid function studies: No results for input(s): "TSH", "T4TOTAL", "T3FREE", "THYROIDAB" in the last 72 hours.  Invalid input(s): "FREET3" Anemia work up: No results for input(s): "VITAMINB12", "FOLATE", "FERRITIN", "TIBC", "IRON", "RETICCTPCT" in the last 72 hours. Sepsis Labs: Recent Labs  Lab  05/15/22 1206 05/16/22 0924 05/17/22 0405  WBC 5.3 7.3 4.6    Microbiology No results found for this or any previous visit (from the past 240 hour(s)).  Procedures and diagnostic studies:  ECHOCARDIOGRAM COMPLETE  Result Date: 05/17/2022    ECHOCARDIOGRAM REPORT   Patient Name:   Kristine Rangel Date of Exam: 05/17/2022 Medical Rec #:  [id number]      Height:       65.0 in Accession #:    [id number]  Weight:       285.0 lb Date of Birth:  Aug 21, 1956      BSA:          2.299 m Patient Age:    66 years       BP:           100/83 mmHg Patient Gender: F              HR:           65 bpm. Exam Location:  [facility] Procedure: 2D Echo, Color Doppler and Cardiac Doppler Indications:     Syncope R55  History:         Patient has prior history of Echocardiogram examinations, most                  recent 03/03/2021. Previous Myocardial Infarction, COPD; Risk                  Factors:Diabetes and Hypertension.  Sonographer:     Cristela Blue Referring Phys:  [id number] Jeri Lager [facility] Diagnosing Phys: Debbe Odea MD  Sonographer Comments: Technically challenging study due to limited acoustic windows and no parasternal window. Image acquisition challenging due to COPD. IMPRESSIONS  1. Left ventricular ejection fraction, by estimation, is 60 to 65%. The left ventricle has normal function. The left ventricle has no regional wall motion abnormalities. Left ventricular diastolic parameters are consistent with Grade I diastolic dysfunction (impaired relaxation).  2. Right ventricular systolic function is normal. The right ventricular size is normal.  3. The mitral valve is normal in structure. No evidence of mitral valve regurgitation.  4. The aortic valve was not well visualized. Aortic valve regurgitation is not visualized. FINDINGS  Left Ventricle: Left ventricular ejection fraction, by estimation, is 60 to 65%. The left ventricle has normal function. The left ventricle has no regional wall motion abnormalities. The left  ventricular internal cavity size was normal in size. There is  no left ventricular hypertrophy. Left ventricular diastolic parameters are consistent with Grade I diastolic dysfunction (impaired relaxation). Right Ventricle: The right ventricular size is normal. No increase in right ventricular wall thickness. Right ventricular systolic function is normal. Left Atrium: Left atrial size was normal in size. Right Atrium: Right atrial size was normal in size. Pericardium: There is no evidence of pericardial effusion. Mitral Valve: The mitral valve is normal in structure. No evidence of mitral valve regurgitation. Tricuspid Valve: The tricuspid valve is grossly normal. Tricuspid valve regurgitation is not demonstrated. Aortic Valve: The aortic valve was not well visualized. Aortic valve regurgitation is not visualized. Aortic valve mean gradient measures 2.0 mmHg. Aortic valve peak gradient measures 4.0 mmHg. Pulmonic Valve: The pulmonic valve was not well visualized. Pulmonic valve regurgitation is not visualized. Aorta: The aortic root is normal in size and structure. Venous: The inferior vena cava was not well visualized. IAS/Shunts: No atrial level shunt detected by color flow Doppler.   Diastology LV e' medial:    6.74 cm/s LV E/e' medial:  7.6 LV e' lateral:   7.62 cm/s LV E/e' lateral: 6.7  RIGHT VENTRICLE RV Basal diam:  3.60 cm RV S prime:     12.40 cm/s TAPSE (M-mode): 2.5 cm LEFT ATRIUM             Index        RIGHT ATRIUM           Index LA Vol (A2C):   37.2 ml 16.18 ml/m  RA Area:  14.30 cm LA Vol (A4C):   47.0 ml 20.44 ml/m  RA Volume:   34.30 ml  14.92 ml/m LA Biplane Vol: 44.3 ml 19.27 ml/m  AORTIC VALVE AV Vmax:           100.00 cm/s AV Vmean:          65.100 cm/s AV VTI:            0.159 m AV Peak Grad:      4.0 mmHg AV Mean Grad:      2.0 mmHg LVOT Vmax:         92.70 cm/s LVOT Vmean:        62.100 cm/s LVOT VTI:          0.166 m LVOT/AV VTI ratio: 1.04 MITRAL VALVE               TRICUSPID  VALVE MV Area (PHT): 1.97 cm    TR Peak grad:   10.6 mmHg MV Decel Time: 386 msec    TR Vmax:        163.00 cm/s MV E velocity: 51.40 cm/s                            SHUNTS                            Systemic VTI: 0.17 m Debbe Odea MD Electronically signed by Debbe Odea MD Signature Date/Time: 05/17/2022/3:29:06 PM    Final    CT HEAD CODE STROKE WO CONTRAST`  Result Date: 05/17/2022 CLINICAL DATA:  Code stroke. Neuro deficit, acute, stroke suspected Change neuro status EXAM: CT HEAD WITHOUT CONTRAST TECHNIQUE: Contiguous axial images were obtained from the base of the skull through the vertex without intravenous contrast. RADIATION DOSE REDUCTION: This exam was performed according to the departmental dose-optimization program which includes automated exposure control, adjustment of the mA and/or kV according to patient size and/or use of iterative reconstruction technique. COMPARISON:  CT 05/16/2022. FINDINGS: Brain: No evidence of acute large vascular territory infarction, hemorrhage, hydrocephalus, extra-axial collection or mass lesion/mass effect. Vascular: Metallic embolization coils are identified in the region the basilar artery. No visible hyperdense vessel. Skull: No acute fracture. Sinuses/Orbits: Clear visualized sinuses. No acute orbital findings. Other: No mastoid effusions. ASPECTS [facility] Stroke Program Early CT Score) total score (0-10 with 10 being normal): 10. IMPRESSION: 1. No evidence of acute intracranial abnormality. 2. ASPECTS is 10. Code stroke imaging results were communicated on 05/17/2022 at 10:23 am to provider Palikh via telephone, who verbally acknowledged these results. Electronically Signed   By: Feliberto Harts M.D.   On: 05/17/2022 10:24   MR BRAIN WO CONTRAST  Result Date: 05/16/2022 CLINICAL DATA:  66 year old female. Syncope, diaphoresis, headache, numbness and weakness. EXAM: MRI HEAD WITHOUT CONTRAST TECHNIQUE: Multiplanar, multiecho pulse sequences of the  brain and surrounding structures were obtained without intravenous contrast. COMPARISON:  CTA head and neck today reported separately. Brain MRI 04/08/2016. FINDINGS: Brain: No restricted diffusion to suggest acute infarction. No midline shift, mass effect, evidence of mass lesion, ventriculomegaly, extra-axial collection or acute intracranial hemorrhage. Cervicomedullary junction and pituitary are within normal limits. Stable gray and white matter signal since the 2017 MRI, largely normal for age. Minimal nonspecific white matter T2 and FLAIR hyperintensity (right frontal lobe series 15, image 32). There is evidence of a small chronic lacunar infarct in the left brainstem near midline on series  10, image 6. And small chronic infarcts also in the posterior right cerebellum (image 7). No cortical encephalomalacia or chronic cerebral blood products identified. Vascular: Major intracranial vascular flow voids are stable since the 2017 MRI, see CTA details today. Skull and upper cervical spine: Negative visible cervical spine. Hyperostosis of the calvarium. Visualized bone marrow signal is within normal limits. Sinuses/Orbits: Negative orbits. Paranasal Visualized paranasal sinuses and mastoids are clear. Other: Visible internal auditory structures appear normal. Negative visible scalp and face. IMPRESSION: 1. No acute intracranial abnormality. 2. 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 Stable noncontrast MRI appearance of the brain since 2017. 3. See also CTA Head and Neck findings today regarding the Basilar artery and Right MCA. Electronically Signed   By: Odessa Fleming M.D.   On: 05/16/2022 10:59   CT ANGIO HEAD NECK W WO CM  Result Date: 05/16/2022 CLINICAL DATA:  66 year old female with near syncope, diaphoresis. Headache. Right side numbness and weakness. History of posterior fossa aneurysm coil embolization. EXAM: CT ANGIOGRAPHY HEAD AND NECK TECHNIQUE: Multidetector CT imaging of the  head and neck was performed using the standard protocol during bolus administration of intravenous contrast. Multiplanar CT image reconstructions and MIPs were obtained to evaluate the vascular anatomy. Carotid stenosis measurements (when applicable) are obtained utilizing NASCET criteria, using the distal internal carotid diameter as the denominator. RADIATION DOSE REDUCTION: This exam was performed according to the departmental dose-optimization program which includes automated exposure control, adjustment of the mA and/or kV according to patient size and/or use of iterative reconstruction technique. CONTRAST:  3mL OMNIPAQUE IOHEXOL 350 MG/ML SOLN COMPARISON:  Plain head CT yesterday.  Brain MRI 04/08/2016. FINDINGS: CT HEAD Brain: Chronic metallic embolization coil pack in the right prepontine/pre medullary cistern with streak artifact. Cerebral volume remains normal. Incidental bulky chronic dural calcifications. No midline shift, ventriculomegaly, mass effect, evidence of mass lesion, intracranial hemorrhage or evidence of cortically based acute infarction. Gray-white matter differentiation is within normal limits throughout the brain. Calvarium and skull base: Hyperostosis of the calvarium. No acute osseous abnormality identified. Paranasal sinuses: Visualized paranasal sinuses and mastoids are clear. Orbits: No acute orbit or scalp soft tissue finding. CTA NECK Skeleton: Absent maxillary dentition. Straightening of cervical lordosis with multilevel cervical disc and endplate degeneration. No acute osseous abnormality identified. Upper chest: Negative. Other neck: Negative. Aortic arch: 3 vessel arch configuration.  No arch atherosclerosis. Right carotid system: Mildly tortuous brachiocephalic artery and proximal right CCA without plaque or stenosis. Negative right carotid bifurcation. Mildly tortuous right ICA with no significant plaque or stenosis. Left carotid system: Similar tortuosity. No significant  plaque or stenosis. Vertebral arteries: Normal proximal right subclavian artery and right vertebral artery origin. Dominant right vertebral artery is patent to the skull base with no plaque or stenosis. Negative proximal left subclavian artery. Non dominant left vertebral artery is patent to the skull base with no plaque or stenosis. CTA HEAD Posterior circulation: Dominant right V4 segment. Non dominant left vertebral artery functionally terminates in PICA, with a small V4 segment continuing to the vertebrobasilar junction. Normal right PICA origin. No distal right vertebral stenosis. Tortuous and somewhat diminutive basilar artery passes just across the top of the aneurysm coil pack in the basilar cistern (series 11, image 128) with no obvious aneurysmal filling. Distal basilar is diminutive but patent. Patent SCA and PCA origins. Both PCAs are diminutive but bilateral PCA branches appear patent without stenosis. Anterior circulation: Both ICA siphons are patent. No left siphon plaque or  stenosis. Right siphon mild calcified plaque but no significant stenosis. Patent carotid termini, MCA and ACA origins. Dominant left and diminutive right ACA A1 segments. Normal anterior communicating artery. Bilateral ACA branches are within normal limits. Left MCA M1 segment and bifurcation are patent without stenosis. Right MCA M1 segment appears to bifurcate early, in an area of tortuous sphenoid palatine venous structures (series 10, image 112). And tortuous vessels were demonstrated here also on the 2017 MRI (noncontrast). No MCA branch occlusion or stenosis is identified. No other abnormal vasculature identified. Venous sinuses: Early contrast timing, not well evaluated. Anatomic variants: Dominant right vertebral artery, the left functionally terminates in PICA. Review of the MIP images confirms the above findings IMPRESSION: 1. Negative for large vessel occlusion. No significant atherosclerotic stenosis in the head or  neck. 2. 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 No adverse features are identified. 3. No patent intracranial aneurysm identified. 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 4. No acute intracranial abnormality. Stable and otherwise negative CT appearance of the brain. Electronically Signed   By: Genevie Ann M.D.   On: 05/16/2022 10:27               LOS: 0 days   Esau Fridman  [facility]   Pager on [email or web address]. If 7PM-7AM, please contact night-coverage at [email or web address]     05/17/2022, 4:09 PM

## 2022-05-17 NOTE — Progress Notes (Addendum)
Stroke Team Progress Note Subjective: Code stroke activated this am.   Physical therapy was walking with the patient and she had sensation of presyncope.  She denies dizziness but felt to be weak on her right side with right facial droop.  Code stroke was activated.    At the time I arrived she was laying in bed, her vital signs were stable systolic blood pressure in the 130s.  Physical therapy, rapid response nurse and patient's nurse were in the room.  Blood glucose was normal.  She denies headache or dizziness or chest pain.  She just felt like she was about to pass out.  Objective: Vital signs in last 24 hours: Temp:  [97.4 F (36.3 C)-98.4 F (36.9 C)] 98.4 F (36.9 C) (07/05 0754) Pulse Rate:  [63-89] 65 (07/05 1033) Resp:  [16-22] 16 (07/05 0754) BP: (98-138)/(46-88) 100/83 (07/05 1033) SpO2:  [96 %-100 %] 100 % (07/05 1033)    Diet   Diet Heart Room service appropriate? Yes; Fluid consistency: Thin   Intake/Output from previous day: 07/04 0701 - 07/05 0700 In: 240 [P.O.:240] Out: 2800 [Urine:2800] Intake/Output this shift: No intake/output data recorded.   General -laying in bed appears anxious   Mental Status -  Level of arousal and orientation to time, place, and person were intact.  No aphasia.  Naming all objects.  Following all commands.   Cranial Nerves II - XII - II - Visual field intact OU. No field cut. III, IV, VI - Extraocular movements intact        V - Facial sensation intact bilaterally VII - Facial movement intact bilaterally VIII - Hearing & vestibular intact bilaterally X - Palate elevates symmetrically XI - Chin turning & shoulder shrug intact bilaterally XII - Tongue protrusion intact   Motor Strength -initially she was able to keep her right arm up and examined.  Be variable.  With encouragement she was able to keep her arm up with no noticeable drift.  No drift in the left upper and bilateral lower extremities.  On strength testing she had 4  out of 5 weakness in the right upper extremity similar to before and unchanged.  Reflexes - The patient's reflexes were symmetrical in all extremities and he had no pathological reflexes.  Toes downgoing bilaterally.   Sensory - Light touch,  were assessed and were symmetrical   Coordination - The patient had normal movements in the hands and feet with no ataxia or dysmetria.  Tremor was absent   Gait and Station - deferred.    NIH shows scale:1  Lab Results: CBG (last 3)  Recent Labs    05/17/22 0957  GLUCAP 103*    Studies: CT HEAD CODE STROKE WO CONTRAST`  Result Date: 05/17/2022 CLINICAL DATA:  Code stroke. Neuro deficit, acute, stroke suspected Change neuro status EXAM: CT HEAD WITHOUT CONTRAST TECHNIQUE: Contiguous axial images were obtained from the base of the skull through the vertex without intravenous contrast. RADIATION DOSE REDUCTION: This exam was performed according to the departmental dose-optimization program which includes automated exposure control, adjustment of the mA and/or kV according to patient size and/or use of iterative reconstruction technique. COMPARISON:  CT 05/16/2022. FINDINGS: Brain: No evidence of acute large vascular territory infarction, hemorrhage, hydrocephalus, extra-axial collection or mass lesion/mass effect. Vascular: Metallic embolization coils are identified in the region the basilar artery. No visible hyperdense vessel. Skull: No acute fracture. Sinuses/Orbits: Clear visualized sinuses. No acute orbital findings. Other: No mastoid effusions. ASPECTS [facility] Stroke Program Early  CT Score) total score (0-10 with 10 being normal): 10. IMPRESSION: 1. No evidence of acute intracranial abnormality. 2. ASPECTS is 10. Code stroke imaging results were communicated on 05/17/2022 at 10:23 am to provider Lucero Ide via telephone, who verbally acknowledged these results. Electronically Signed   By: Feliberto Harts M.D.   On: 05/17/2022 10:24   MR BRAIN WO  CONTRAST  Result Date: 05/16/2022 CLINICAL DATA:  66 year old female. Syncope, diaphoresis, headache, numbness and weakness. EXAM: MRI HEAD WITHOUT CONTRAST TECHNIQUE: Multiplanar, multiecho pulse sequences of the brain and surrounding structures were obtained without intravenous contrast. COMPARISON:  CTA head and neck today reported separately. Brain MRI 04/08/2016. FINDINGS: Brain: No restricted diffusion to suggest acute infarction. No midline shift, mass effect, evidence of mass lesion, ventriculomegaly, extra-axial collection or acute intracranial hemorrhage. Cervicomedullary junction and pituitary are within normal limits. Stable gray and white matter signal since the 2017 MRI, largely normal for age. Minimal nonspecific white matter T2 and FLAIR hyperintensity (right frontal lobe series 15, image 32). There is evidence of a small chronic lacunar infarct in the left brainstem near midline on series 10, image 6. And small chronic infarcts also in the posterior right cerebellum (image 7). No cortical encephalomalacia or chronic cerebral blood products identified. Vascular: Major intracranial vascular flow voids are stable since the 2017 MRI, see CTA details today. Skull and upper cervical spine: Negative visible cervical spine. Hyperostosis of the calvarium. Visualized bone marrow signal is within normal limits. Sinuses/Orbits: Negative orbits. Paranasal Visualized paranasal sinuses and mastoids are clear. Other: Visible internal auditory structures appear normal. Negative visible scalp and face. IMPRESSION: 1. No acute intracranial abnormality. 2. 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 Stable noncontrast MRI appearance of the brain since 2017. 3. See also CTA Head and Neck findings today regarding the Basilar artery and Right MCA. Electronically Signed   By: Odessa Fleming M.D.   On: 05/16/2022 10:59   CT ANGIO HEAD NECK W WO CM  Result Date: 05/16/2022 CLINICAL DATA:  66 year old  female with near syncope, diaphoresis. Headache. Right side numbness and weakness. History of posterior fossa aneurysm coil embolization. EXAM: CT ANGIOGRAPHY HEAD AND NECK TECHNIQUE: Multidetector CT imaging of the head and neck was performed using the standard protocol during bolus administration of intravenous contrast. Multiplanar CT image reconstructions and MIPs were obtained to evaluate the vascular anatomy. Carotid stenosis measurements (when applicable) are obtained utilizing NASCET criteria, using the distal internal carotid diameter as the denominator. RADIATION DOSE REDUCTION: This exam was performed according to the departmental dose-optimization program which includes automated exposure control, adjustment of the mA and/or kV according to patient size and/or use of iterative reconstruction technique. CONTRAST:  30mL OMNIPAQUE IOHEXOL 350 MG/ML SOLN COMPARISON:  Plain head CT yesterday.  Brain MRI 04/08/2016. FINDINGS: CT HEAD Brain: Chronic metallic embolization coil pack in the right prepontine/pre medullary cistern with streak artifact. Cerebral volume remains normal. Incidental bulky chronic dural calcifications. No midline shift, ventriculomegaly, mass effect, evidence of mass lesion, intracranial hemorrhage or evidence of cortically based acute infarction. Gray-white matter differentiation is within normal limits throughout the brain. Calvarium and skull base: Hyperostosis of the calvarium. No acute osseous abnormality identified. Paranasal sinuses: Visualized paranasal sinuses and mastoids are clear. Orbits: No acute orbit or scalp soft tissue finding. CTA NECK Skeleton: Absent maxillary dentition. Straightening of cervical lordosis with multilevel cervical disc and endplate degeneration. No acute osseous abnormality identified. Upper chest: Negative. Other neck: Negative. Aortic arch: 3 vessel arch configuration.  No arch atherosclerosis. Right carotid system: Mildly tortuous brachiocephalic  artery and proximal right CCA without plaque or stenosis. Negative right carotid bifurcation. Mildly tortuous right ICA with no significant plaque or stenosis. Left carotid system: Similar tortuosity. No significant plaque or stenosis. Vertebral arteries: Normal proximal right subclavian artery and right vertebral artery origin. Dominant right vertebral artery is patent to the skull base with no plaque or stenosis. Negative proximal left subclavian artery. Non dominant left vertebral artery is patent to the skull base with no plaque or stenosis. CTA HEAD Posterior circulation: Dominant right V4 segment. Non dominant left vertebral artery functionally terminates in PICA, with a small V4 segment continuing to the vertebrobasilar junction. Normal right PICA origin. No distal right vertebral stenosis. Tortuous and somewhat diminutive basilar artery passes just across the top of the aneurysm coil pack in the basilar cistern (series 11, image 128) with no obvious aneurysmal filling. Distal basilar is diminutive but patent. Patent SCA and PCA origins. Both PCAs are diminutive but bilateral PCA branches appear patent without stenosis. Anterior circulation: Both ICA siphons are patent. No left siphon plaque or stenosis. Right siphon mild calcified plaque but no significant stenosis. Patent carotid termini, MCA and ACA origins. Dominant left and diminutive right ACA A1 segments. Normal anterior communicating artery. Bilateral ACA branches are within normal limits. Left MCA M1 segment and bifurcation are patent without stenosis. Right MCA M1 segment appears to bifurcate early, in an area of tortuous sphenoid palatine venous structures (series 10, image 112). And tortuous vessels were demonstrated here also on the 2017 MRI (noncontrast). No MCA branch occlusion or stenosis is identified. No other abnormal vasculature identified. Venous sinuses: Early contrast timing, not well evaluated. Anatomic variants: Dominant right  vertebral artery, the left functionally terminates in PICA. Review of the MIP images confirms the above findings IMPRESSION: 1. Negative for large vessel occlusion. No significant atherosclerotic stenosis in the head or neck. 2. 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 No adverse features are identified. 3. No patent intracranial aneurysm identified. 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 4. No acute intracranial abnormality. Stable and otherwise negative CT appearance of the brain. Electronically Signed   By: Odessa Fleming M.D.   On: 05/16/2022 10:27   CT Head Wo Contrast  Result Date: 05/15/2022 CLINICAL DATA:  Syncope.  Neuro deficit.  Headache. EXAM: CT HEAD WITHOUT CONTRAST TECHNIQUE: Contiguous axial images were obtained from the base of the skull through the vertex without intravenous contrast. RADIATION DOSE REDUCTION: This exam was performed according to the departmental dose-optimization program which includes automated exposure control, adjustment of the mA and/or kV according to patient size and/or use of iterative reconstruction technique. COMPARISON:  04/08/2016 FINDINGS: Brain: No evidence of acute infarction, hemorrhage, hydrocephalus, extra-axial collection or mass lesion/mass effect. Vascular: No hyperdense vessel or unexpected calcification. Metallic embolization coils are identified in the region of the basilar artery. Skull: Normal. Negative for fracture or focal lesion. Sinuses/Orbits: No acute finding. Other: None IMPRESSION: 1. No acute intracranial abnormalities. Electronically Signed   By: Signa Kell M.D.   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  She was admitted initially for presyncopal episode with right-sided numbness and MRI was negative for stroke.  It appears she had another  presyncopal episode while she was walking with physical therapy.  Her exam appears to be at baseline.  I accompanied the patient to CT scanner for stat head CT that was negative. Treatment/Plan:  Stat head CT reviewed by me negative for bleed.  No further acute work-up needed.  No indication for LVO.  Recommend every hour- neurochecks for the next 4 hours while she is in the TNK window.  Then can resume every 4 hour neurochecks.  Recommend orthostatics laying sitting and standing, encourage fluid intake and may need cardiac evaluation for presyncopal episodes.  Echocardiogram. Twelve-lead EKG    Total of 50 mins spent reviewing chart, discussion with patient on prognosis, Dx and plan. Discussed case with patient's nurse. Reviewed Imaging personally.  Harolyn Rutherford

## 2022-05-17 NOTE — Progress Notes (Signed)
*  PRELIMINARY RESULTS* Echocardiogram 2D Echocardiogram has been performed.  Cristela Blue 05/17/2022, 1:28 PM

## 2022-05-17 NOTE — Progress Notes (Signed)
   05/17/22 1600  Clinical Encounter Type  Visited With Patient  Visit Type Initial  Referral From Nurse  Consult/Referral To Chaplain   Chaplain responded to code stroke. Patient in scans. On call chaplain will follow up.

## 2022-05-18 DIAGNOSIS — R531 Weakness: Secondary | ICD-10-CM | POA: Diagnosis not present

## 2022-05-18 NOTE — Plan of Care (Signed)
  Problem: Education: Goal: Knowledge of General Education information will improve Description: Including pain rating scale, medication(s)/side effects and non-pharmacologic comfort measures Outcome: Progressing   Problem: Health Behavior/Discharge Planning: Goal: Ability to manage health-related needs will improve Outcome: Progressing   Problem: Clinical Measurements: Goal: Ability to maintain clinical measurements within normal limits will improve Outcome: Progressing Goal: Will remain free from infection Outcome: Progressing Goal: Diagnostic test results will improve Outcome: Progressing Goal: Respiratory complications will improve Outcome: Progressing Goal: Cardiovascular complication will be avoided Outcome: Progressing   Problem: Activity: Goal: Risk for activity intolerance will decrease Outcome: Progressing   Problem: Nutrition: Goal: Adequate nutrition will be maintained Outcome: Progressing   Problem: Coping: Goal: Level of anxiety will decrease Outcome: Progressing   Problem: Elimination: Goal: Will not experience complications related to bowel motility Outcome: Progressing Goal: Will not experience complications related to urinary retention Outcome: Progressing   Problem: Pain Managment: Goal: General experience of comfort will improve Outcome: Progressing   Problem: Safety: Goal: Ability to remain free from injury will improve Outcome: Progressing   Problem: Skin Integrity: Goal: Risk for impaired skin integrity will decrease Outcome: Progressing   Problem: Education: Goal: Knowledge of disease or condition will improve Outcome: Progressing Goal: Knowledge of secondary prevention will improve (SELECT ALL) Outcome: Progressing Goal: Knowledge of patient specific risk factors will improve (INDIVIDUALIZE FOR PATIENT) Outcome: Progressing Goal: Individualized Educational Video(s) Outcome: Progressing   Problem: Coping: Goal: Will verbalize  positive feelings about self Outcome: Progressing Goal: Will identify appropriate support needs Outcome: Progressing   Problem: Health Behavior/Discharge Planning: Goal: Ability to manage health-related needs will improve Outcome: Progressing   Problem: Self-Care: Goal: Ability to participate in self-care as condition permits will improve Outcome: Progressing Goal: Ability to communicate needs accurately will improve Outcome: Progressing   Problem: Nutrition: Goal: Risk of aspiration will decrease Outcome: Progressing   Problem: Ischemic Stroke/TIA Tissue Perfusion: Goal: Complications of ischemic stroke/TIA will be minimized Outcome: Progressing

## 2022-05-18 NOTE — Progress Notes (Signed)
Pt discharged home. Discharge packet reviewed with patient, verbalizes understanding. Belongings given to patient. PIV and tele removed. Pt wheeled to front by volunteer.

## 2022-05-18 NOTE — Discharge Summary (Addendum)
Physician Discharge Summary   Patient: Kristine Rangel MRN: [id number] DOB: 08-06-1956  Admit date:     05/15/2022  Discharge date: 05/18/2022  Discharge Physician: Lurene Shadow   PCP: Leanna Sato, MD   Recommendations at discharge:   Follow-up with PCP in 1 week Follow-up with neurologist in 1 month  Discharge Diagnoses: Principal Problem:   Right sided weakness Active Problems:   Benign essential hypertension   Morbid obesity (HCC)   GERD (gastroesophageal reflux disease)   Peripheral neuropathy   HLD (hyperlipidemia)   OSA on CPAP   Chronic diastolic CHF (congestive heart failure) (HCC)   Asthma, chronic   Chronic kidney disease, stage 3a (HCC)  Resolved Problems:   * No resolved hospital problems. *  Hospital Course:  Kristine Rangel is a 66 y.o. female with medical history significant for hypertension, hyperlipidemia, GERD, TIA, CHF, peripheral neuropathy, CKD probable stage IIIa, morbid obesity, COPD, asthma, OSA on CPAP.  She presented to the hospital with right-sided weakness for about 3 days duration.  He also had a headache, lightheadedness, nausea and sweating.   CT head and MRI brain did not show any acute abnormality of stroke.  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  While on admission, she developed near syncope, questionable slurred speech and right upper extremity weakness while working with PT.  Code stroke was called but repeat CT head did not show any acute stroke.  Her condition has improved.  She has been able to ambulate to the bathroom and she is back to her baseline.  She is deemed stable for discharge.  PT recommended home health therapy.        Consultants: Neurologist Procedures performed: None Disposition: Home Diet recommendation:  Discharge Diet Orders (From admission, onward)     Start     Ordered    05/18/22 0000  Diet - low sodium heart healthy        05/18/22 1052          Home Cardiac diet DISCHARGE MEDICATION: Allergies as of 05/18/2022       Reactions   Bee Venom Anaphylaxis   Penicillins Hives, Swelling, Other (See Comments)   Has patient had a PCN reaction causing immediate rash, facial/tongue/throat swelling, SOB or lightheadedness with hypotension: Yes Has patient had a PCN reaction causing severe rash involving mucus membranes or skin necrosis: No Has patient had a PCN reaction that required hospitalization No Has patient had a PCN reaction occurring within the last 10 years: No If all of the above answers are "NO", then may proceed with Cephalosporin use.        Medication List     STOP taking these medications    brinzolamide 1 % ophthalmic suspension Commonly known as: AZOPT   diclofenac 75 MG EC tablet Commonly known as: VOLTAREN   meloxicam 7.5 MG tablet Commonly known as: MOBIC   nitroGLYCERIN 0.4 MG SL tablet Commonly known as: NITROSTAT   POTASSIUM CHLORIDE PO   QC Tumeric Complex 500 MG Caps Generic drug: Turmeric   solifenacin 10 MG tablet Commonly known as: VESICARE   tiZANidine 4 MG tablet Commonly known as: ZANAFLEX   traMADol 50 MG tablet Commonly known as: ULTRAM       TAKE these medications    albuterol 108 (90 Base) MCG/ACT inhaler Commonly known as: VENTOLIN HFA Inhale  into the lungs every 6 (six) hours as needed for wheezing or shortness of breath.   albuterol (2.5 MG/3ML) 0.083% nebulizer solution Commonly known as: PROVENTIL Take 2.5 mg by nebulization every 6 (six) hours as needed for wheezing or shortness of breath.   aspirin EC 81 MG tablet Take 81 mg by mouth daily. Swallow whole.   atorvastatin 40 MG tablet Commonly known as: LIPITOR Take 40 mg by mouth at bedtime.   brimonidine-timolol 0.2-0.5 % ophthalmic solution Commonly known as: COMBIGAN Place 1 drop into both eyes 2 (two) times daily.    carvedilol 6.25 MG tablet Commonly known as: COREG Take 1 tablet (6.25 mg total) by mouth 2 (two) times daily.   cetirizine 10 MG tablet Commonly known as: ZYRTEC Take 10 mg by mouth daily.   cholecalciferol 25 MCG (1000 UNIT) tablet Commonly known as: VITAMIN D3 Take 1,000 Units by mouth daily.   dicyclomine 10 MG capsule Commonly known as: BENTYL Take 10 mg by mouth 4 (four) times daily -  before meals and at bedtime.   DULoxetine 60 MG capsule Commonly known as: CYMBALTA Take 60 mg by mouth at bedtime.   empagliflozin 25 MG Tabs tablet Commonly known as: JARDIANCE Take 25 mg by mouth daily.   EPINEPHrine 0.3 mg/0.3 mL Soaj injection Commonly known as: EPI-PEN Inject 0.3 mg into the skin as needed for anaphylaxis.   esomeprazole 40 MG capsule Commonly known as: NEXIUM Take 40 mg by mouth 2 (two) times daily before a meal.   fluticasone 50 MCG/ACT nasal spray Commonly known as: FLONASE Place 1 spray into both nostrils daily.   furosemide 40 MG tablet Commonly known as: LASIX Take 40 mg by mouth 2 (two) times daily.   gabapentin 300 MG capsule Commonly known as: NEURONTIN Take 300 mg by mouth every 8 (eight) hours.   HYDROcodone-acetaminophen 5-325 MG tablet Commonly known as: NORCO/VICODIN Take 1 tablet by mouth every 6 (six) hours as needed for moderate pain.   isosorbide mononitrate 60 MG 24 hr tablet Commonly known as: IMDUR Take 1.5 tablets (90 mg total) by mouth daily.   latanoprost 0.005 % ophthalmic solution Commonly known as: XALATAN Place 1 drop into both eyes at bedtime.   montelukast 10 MG tablet Commonly known as: SINGULAIR Take 10 mg by mouth at bedtime.   Myrbetriq 25 MG Tb24 tablet Generic drug: mirabegron ER TAKE 1 TABLET BY MOUTH EVERY DAY for bladder   Ozempic (0.25 or 0.5 MG/DOSE) 2 MG/3ML Sopn Generic drug: Semaglutide(0.25 or 0.5MG /DOS) Inject 0.25 mg into the skin once a week.   Symbicort 160-4.5 MCG/ACT inhaler Generic  drug: budesonide-formoterol Inhale 2 puffs into the lungs daily in the afternoon.   topiramate 100 MG tablet Commonly known as: TOPAMAX Take 100 mg by mouth 2 (two) times daily.   valsartan 40 MG tablet Commonly known as: DIOVAN TAKE 1 TABLET BY MOUTH DAILY   Vitamin C 100 MG Chew Chew by mouth.   zolpidem 10 MG tablet Commonly known as: AMBIEN Take 10 mg by mouth at bedtime as needed.        Follow-up Information     [facility]. Schedule an appointment as soon as possible for a visit in 1 month(s).   Why: recent near syncope and right arm weakness. H/o stroke Contact information: [place] [phone]               Discharge Exam: Danley Danker Weights   05/15/22  1202  Weight: 129.3 kg   GEN: NAD SKIN: Warm and dry EYES: No pallor or icterus ENT: MMM CV: RRR PULM: CTA B ABD: soft, obese, NT, +BS CNS: AAO x 3, non focal EXT: No edema or tenderness   Condition at discharge: good  The results of significant diagnostics from this hospitalization (including imaging, microbiology, ancillary and laboratory) are listed below for reference.   Imaging Studies: ECHOCARDIOGRAM COMPLETE  Result Date: 05/17/2022    ECHOCARDIOGRAM REPORT   Patient Name:   ABBEYGALE SOLOMONSON Date of Exam: 05/17/2022 Medical Rec #:  [id number]      Height:       65.0 in Accession #:    [id number]     Weight:       285.0 lb Date of Birth:  1956-05-01      BSA:          2.299 m Patient Age:    66 years       BP:           100/83 mmHg Patient Gender: F              HR:           65 bpm. Exam Location:  [facility] Procedure: 2D Echo, Color Doppler and Cardiac Doppler Indications:     Syncope R55  History:         Patient has prior history of Echocardiogram examinations, most                  recent 03/03/2021. Previous Myocardial Infarction, COPD; Risk                  Factors:Diabetes and Hypertension.  Sonographer:     Sherrie Sport  Referring Phys:  [id number] [place] Diagnosing Phys: Kate Sable MD  Sonographer Comments: Technically challenging study due to limited acoustic windows and no parasternal window. Image acquisition challenging due to COPD. IMPRESSIONS  1. Left ventricular ejection fraction, by estimation, is 60 to 65%. The left ventricle has normal function. The left ventricle has no regional wall motion abnormalities. Left ventricular diastolic parameters are consistent with Grade I diastolic dysfunction (impaired relaxation).  2. Right ventricular systolic function is normal. The right ventricular size is normal.  3. The mitral valve is normal in structure. No evidence of mitral valve regurgitation.  4. The aortic valve was not well visualized. Aortic valve regurgitation is not visualized. FINDINGS  Left Ventricle: Left ventricular ejection fraction, by estimation, is 60 to 65%. The left ventricle has normal function. The left ventricle has no regional wall motion abnormalities. The left ventricular internal cavity size was normal in size. There is  no left ventricular hypertrophy. Left ventricular diastolic parameters are consistent with Grade I diastolic dysfunction (impaired relaxation). Right Ventricle: The right ventricular size is normal. No increase in right ventricular wall thickness. Right ventricular systolic function is normal. Left Atrium: Left atrial size was normal in size. Right Atrium: Right atrial size was normal in size. Pericardium: There is no evidence of pericardial effusion. Mitral Valve: The mitral valve is normal in structure. No evidence of mitral valve regurgitation. Tricuspid Valve: The tricuspid valve is grossly normal. Tricuspid valve regurgitation is not demonstrated. Aortic Valve: The aortic valve was not well visualized. Aortic valve regurgitation is not visualized. Aortic valve mean gradient measures 2.0 mmHg. Aortic valve peak gradient measures 4.0 mmHg. Pulmonic Valve: The pulmonic  valve was not well visualized. Pulmonic valve regurgitation is not visualized. Aorta: The  aortic root is normal in size and structure. Venous: The inferior vena cava was not well visualized. IAS/Shunts: No atrial level shunt detected by color flow Doppler.   Diastology LV e' medial:    6.74 cm/s LV E/e' medial:  7.6 LV e' lateral:   7.62 cm/s LV E/e' lateral: 6.7  RIGHT VENTRICLE RV Basal diam:  3.60 cm RV S prime:     12.40 cm/s TAPSE (M-mode): 2.5 cm LEFT ATRIUM             Index        RIGHT ATRIUM           Index LA Vol (A2C):   37.2 ml 16.18 ml/m  RA Area:     14.30 cm LA Vol (A4C):   47.0 ml 20.44 ml/m  RA Volume:   34.30 ml  14.92 ml/m LA Biplane Vol: 44.3 ml 19.27 ml/m  AORTIC VALVE AV Vmax:           100.00 cm/s AV Vmean:          65.100 cm/s AV VTI:            0.159 m AV Peak Grad:      4.0 mmHg AV Mean Grad:      2.0 mmHg LVOT Vmax:         92.70 cm/s LVOT Vmean:        62.100 cm/s LVOT VTI:          0.166 m LVOT/AV VTI ratio: 1.04 MITRAL VALVE               TRICUSPID VALVE MV Area (PHT): 1.97 cm    TR Peak grad:   10.6 mmHg MV Decel Time: 386 msec    TR Vmax:        163.00 cm/s MV E velocity: 51.40 cm/s                            SHUNTS                            Systemic VTI: 0.17 m Debbe Odea MD Electronically signed by Debbe Odea MD Signature Date/Time: 05/17/2022/3:29:06 PM    Final    CT HEAD CODE STROKE WO CONTRAST`  Result Date: 05/17/2022 CLINICAL DATA:  Code stroke. Neuro deficit, acute, stroke suspected Change neuro status EXAM: CT HEAD WITHOUT CONTRAST TECHNIQUE: Contiguous axial images were obtained from the base of the skull through the vertex without intravenous contrast. RADIATION DOSE REDUCTION: This exam was performed according to the departmental dose-optimization program which includes automated exposure control, adjustment of the mA and/or kV according to patient size and/or use of iterative reconstruction technique. COMPARISON:  CT 05/16/2022. FINDINGS: Brain: No  evidence of acute large vascular territory infarction, hemorrhage, hydrocephalus, extra-axial collection or mass lesion/mass effect. Vascular: Metallic embolization coils are identified in the region the basilar artery. No visible hyperdense vessel. Skull: No acute fracture. Sinuses/Orbits: Clear visualized sinuses. No acute orbital findings. Other: No mastoid effusions. ASPECTS [facility] Stroke Program Early CT Score) total score (0-10 with 10 being normal): 10. IMPRESSION: 1. No evidence of acute intracranial abnormality. 2. ASPECTS is 10. Code stroke imaging results were communicated on 05/17/2022 at 10:23 am to provider Palikh via telephone, who verbally acknowledged these results. Electronically Signed   By: Feliberto Harts M.D.   On: 05/17/2022 10:24   MR BRAIN WO CONTRAST  Result Date: 05/16/2022 CLINICAL DATA:  66 year old female. Syncope, diaphoresis, headache, numbness and weakness. EXAM: MRI HEAD WITHOUT CONTRAST TECHNIQUE: Multiplanar, multiecho pulse sequences of the brain and surrounding structures were obtained without intravenous contrast. COMPARISON:  CTA head and neck today reported separately. Brain MRI 04/08/2016. FINDINGS: Brain: No restricted diffusion to suggest acute infarction. No midline shift, mass effect, evidence of mass lesion, ventriculomegaly, extra-axial collection or acute intracranial hemorrhage. Cervicomedullary junction and pituitary are within normal limits. Stable gray and white matter signal since the 2017 MRI, largely normal for age. Minimal nonspecific white matter T2 and FLAIR hyperintensity (right frontal lobe series 15, image 32). There is evidence of a small chronic lacunar infarct in the left brainstem near midline on series 10, image 6. And small chronic infarcts also in the posterior right cerebellum (image 7). No cortical encephalomalacia or chronic cerebral blood products identified. Vascular: Major intracranial vascular flow voids are stable since the 2017 MRI,  see CTA details today. Skull and upper cervical spine: Negative visible cervical spine. Hyperostosis of the calvarium. Visualized bone marrow signal is within normal limits. Sinuses/Orbits: Negative orbits. Paranasal Visualized paranasal sinuses and mastoids are clear. Other: Visible internal auditory structures appear normal. Negative visible scalp and face. IMPRESSION: 1. No acute intracranial abnormality. 2. 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 Stable noncontrast MRI appearance of the brain since 2017. 3. See also CTA Head and Neck findings today regarding the Basilar artery and Right MCA. Electronically Signed   By: Genevie Ann M.D.   On: 05/16/2022 10:59   CT ANGIO HEAD NECK W WO CM  Result Date: 05/16/2022 CLINICAL DATA:  66 year old female with near syncope, diaphoresis. Headache. Right side numbness and weakness. History of posterior fossa aneurysm coil embolization. EXAM: CT ANGIOGRAPHY HEAD AND NECK TECHNIQUE: Multidetector CT imaging of the head and neck was performed using the standard protocol during bolus administration of intravenous contrast. Multiplanar CT image reconstructions and MIPs were obtained to evaluate the vascular anatomy. Carotid stenosis measurements (when applicable) are obtained utilizing NASCET criteria, using the distal internal carotid diameter as the denominator. RADIATION DOSE REDUCTION: This exam was performed according to the departmental dose-optimization program which includes automated exposure control, adjustment of the mA and/or kV according to patient size and/or use of iterative reconstruction technique. CONTRAST:  37mL OMNIPAQUE IOHEXOL 350 MG/ML SOLN COMPARISON:  Plain head CT yesterday.  Brain MRI 04/08/2016. FINDINGS: CT HEAD Brain: Chronic metallic embolization coil pack in the right prepontine/pre medullary cistern with streak artifact. Cerebral volume remains normal. Incidental bulky chronic dural calcifications. No midline shift,  ventriculomegaly, mass effect, evidence of mass lesion, intracranial hemorrhage or evidence of cortically based acute infarction. Gray-white matter differentiation is within normal limits throughout the brain. Calvarium and skull base: Hyperostosis of the calvarium. No acute osseous abnormality identified. Paranasal sinuses: Visualized paranasal sinuses and mastoids are clear. Orbits: No acute orbit or scalp soft tissue finding. CTA NECK Skeleton: Absent maxillary dentition. Straightening of cervical lordosis with multilevel cervical disc and endplate degeneration. No acute osseous abnormality identified. Upper chest: Negative. Other neck: Negative. Aortic arch: 3 vessel arch configuration.  No arch atherosclerosis. Right carotid system: Mildly tortuous brachiocephalic artery and proximal right CCA without plaque or stenosis. Negative right carotid bifurcation. Mildly tortuous right ICA with no significant plaque or stenosis. Left carotid system: Similar tortuosity. No significant plaque or stenosis. Vertebral arteries: Normal proximal right subclavian artery and right vertebral artery origin. Dominant right vertebral artery is patent to the skull base  with no plaque or stenosis. Negative proximal left subclavian artery. Non dominant left vertebral artery is patent to the skull base with no plaque or stenosis. CTA HEAD Posterior circulation: Dominant right V4 segment. Non dominant left vertebral artery functionally terminates in PICA, with a small V4 segment continuing to the vertebrobasilar junction. Normal right PICA origin. No distal right vertebral stenosis. Tortuous and somewhat diminutive basilar artery passes just across the top of the aneurysm coil pack in the basilar cistern (series 11, image 128) with no obvious aneurysmal filling. Distal basilar is diminutive but patent. Patent SCA and PCA origins. Both PCAs are diminutive but bilateral PCA branches appear patent without stenosis. Anterior circulation:  Both ICA siphons are patent. No left siphon plaque or stenosis. Right siphon mild calcified plaque but no significant stenosis. Patent carotid termini, MCA and ACA origins. Dominant left and diminutive right ACA A1 segments. Normal anterior communicating artery. Bilateral ACA branches are within normal limits. Left MCA M1 segment and bifurcation are patent without stenosis. Right MCA M1 segment appears to bifurcate early, in an area of tortuous sphenoid palatine venous structures (series 10, image 112). And tortuous vessels were demonstrated here also on the 2017 MRI (noncontrast). No MCA branch occlusion or stenosis is identified. No other abnormal vasculature identified. Venous sinuses: Early contrast timing, not well evaluated. Anatomic variants: Dominant right vertebral artery, the left functionally terminates in PICA. Review of the MIP images confirms the above findings IMPRESSION: 1. Negative for large vessel occlusion. No significant atherosclerotic stenosis in the head or neck. 2. 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 No adverse features are identified. 3. No patent intracranial aneurysm identified. 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 4. No acute intracranial abnormality. Stable and otherwise negative CT appearance of the brain. Electronically Signed   By: Genevie Ann M.D.   On: 05/16/2022 10:27   CT Head Wo Contrast  Result Date: 05/15/2022 CLINICAL DATA:  Syncope.  Neuro deficit.  Headache. EXAM: CT HEAD WITHOUT CONTRAST TECHNIQUE: Contiguous axial images were obtained from the base of the skull through the vertex without intravenous contrast. RADIATION DOSE REDUCTION: This exam was performed according to the departmental dose-optimization program which includes automated exposure control, adjustment of the mA and/or kV according to patient size and/or use  of iterative reconstruction technique. COMPARISON:  04/08/2016 FINDINGS: Brain: No evidence of acute infarction, hemorrhage, hydrocephalus, extra-axial collection or mass lesion/mass effect. Vascular: No hyperdense vessel or unexpected calcification. Metallic embolization coils are identified in the region of the basilar artery. Skull: Normal. Negative for fracture or focal lesion. Sinuses/Orbits: No acute finding. Other: None IMPRESSION: 1. No acute intracranial abnormalities. Electronically Signed   By: Kerby Moors M.D.   On: 05/15/2022 14:42    Microbiology: Results for orders placed or performed during the hospital encounter of 05/03/20  SARS CORONAVIRUS 2 (TAT 6-24 HRS) Nasopharyngeal Nasopharyngeal Swab     Status: None   Collection Time: 05/03/20  1:46 PM   Specimen: Nasopharyngeal Swab  Result Value Ref Range Status   SARS Coronavirus 2 NEGATIVE NEGATIVE Final    Comment: (NOTE) SARS-CoV-2 target nucleic acids are NOT DETECTED.  The SARS-CoV-2 RNA is generally detectable in upper and lower respiratory specimens during the acute phase of infection. Negative results do not preclude SARS-CoV-2 infection, do not rule out co-infections with other pathogens, and should not be  used as the sole basis for treatment or other patient management decisions. Negative results must be combined with clinical observations, patient history, and epidemiological information. The expected result is Negative.  Fact Sheet for Patients: SugarRoll.be  Fact Sheet for Healthcare Providers: https://www.woods-mathews.com/  This test is not yet approved or cleared by the [place] FDA and  has been authorized for detection and/or diagnosis of SARS-CoV-2 by FDA under an Emergency Use Authorization (EUA). This EUA will remain  in effect (meaning this test can be used) for the duration of the COVID-19 declaration under Se ction 564(b)(1) of the Act, 21  U.S.C. section 360bbb-3(b)(1), unless the authorization is terminated or revoked sooner.  Performed at [facility], [place] [address], [place], [place] [address]     Labs: CBC: Recent Labs  Lab 05/15/22 1206 05/16/22 0924 05/17/22 0405  WBC 5.3 7.3 4.6  HGB 12.8 12.0 12.3  HCT 40.9 38.2 39.7  MCV 83.6 85.3 84.3  PLT 351 328 [id number]   Basic Metabolic Panel: Recent Labs  Lab 05/15/22 1206 05/16/22 0924 05/17/22 0405  NA 140 139 141  K 4.1 4.3 4.7  CL 106 104 105  CO2 25 28 30   GLUCOSE 96 93 93  BUN 19 15 16   CREATININE 1.12* 1.07* 1.16*  CALCIUM 10.5* 9.9 10.3   Liver Function Tests: No results for input(s): "AST", "ALT", "ALKPHOS", "BILITOT", "PROT", "ALBUMIN" in the last 168 hours. CBG: Recent Labs  Lab 05/17/22 0957  GLUCAP 103*    Discharge time spent: greater than 30 minutes.  Signed: Jennye Boroughs, MD [facility] 05/18/2022

## 2022-05-18 NOTE — Progress Notes (Signed)
Physical Therapy Treatment Patient Details Name: Kristine Rangel MRN: [id number] DOB: 04/05/1956 Today's Date: 05/18/2022   History of Present Illness Per chart, 66 y/o F w/ PMH of HTN, HLD, GERD, TIAs, CHF, peripheral neuropathy,CKD,  morbid obesity, COPD, asthma, OSA on CPAP who presents right sided weakness x 3 days. On saturday pt c/o an episode w/ sweating, headache, lightheadedness, nausea and right sided weakness. No of these symptoms improved over the following 2 days so pt came to the ER. Pt denies ever having above stated symptoms in the past. Pt denies any fever, chills, chest pain, vomiting, abd pain, dysuria, urinary urgency, urinary frequency, diarrhea or constipation. Pt uses a walker and lives alone but has a care giver.  Imaging negative for acute neurological insult; significant for previous coiling of basilar aneurysm.  Repeat head CT (-) for acute findings 7/5.    PT Comments    Pt resting in bed upon PT arrival; agreeable to PT session.  Nurse then arrived and reporting pt discharging and took out pt's IV's and NT then arrived and assisted pt with getting dressed for discharge.  Pt and NT met therapist in hallway where pt was able to ambulate 80 feet with rollator SBA.  Mild decreased stance time noted R LE (pt reports R LE still feeling a little weaker) with ambulation but pt steady and safe ambulating with rollator in hallway.  Pt reports no concerns for discharge home and has someone coming early this afternoon to assist her within the home.   Recommendations for follow up therapy are one component of a multi-disciplinary discharge planning process, led by the attending physician.  Recommendations may be updated based on patient status, additional functional criteria and insurance authorization.  Follow Up Recommendations  Home health PT     Assistance Recommended at Discharge Set up Supervision/Assistance  Patient can return home with the following A little help with  bathing/dressing/bathroom;Assistance with cooking/housework;Assist for transportation   Equipment Recommendations   (pt has rollator already)    Recommendations for Other Services       Precautions / Restrictions Precautions Precautions: Fall Restrictions Weight Bearing Restrictions: No     Mobility  Bed Mobility                    Transfers Overall transfer level: Modified independent Equipment used: Rollator (4 wheels) Transfers: Sit to/from Stand Sit to Stand: Modified independent (Device/Increase time)                Ambulation/Gait Ambulation/Gait assistance: Supervision Gait Distance (Feet): 80 Feet Assistive device: Rollator (4 wheels)   Gait velocity: mildly decreased     General Gait Details: step through gait pattern; mild decreased stance time R LE; wider BOS with decreased heel strike/toe off; mild increased B lateral sway (anticipate d/t body habitus); mild forward trunk flexion   Stairs             Wheelchair Mobility    Modified Rankin (Stroke Patients Only)       Balance Overall balance assessment: Needs assistance Sitting-balance support: No upper extremity supported, Feet supported Sitting balance-Leahy Scale: Good     Standing balance support: Bilateral upper extremity supported Standing balance-Leahy Scale: Good Standing balance comment: steady ambulating with rollator use                            Cognition Arousal/Alertness: Awake/alert Behavior During Therapy: WFL for tasks assessed/performed Overall Cognitive Status:  Within Functional Limits for tasks assessed                                 General Comments: A&O x4        Exercises      General Comments        Pertinent Vitals/Pain Pain Assessment Pain Assessment: No/denies pain Pain Intervention(s): Limited activity within patient's tolerance, Monitored during session    Home Living                           Prior Function            PT Goals (current goals can now be found in the care plan section) Acute Rehab PT Goals Patient Stated Goal: to get home when I'm ready PT Goal Formulation: With patient Time For Goal Achievement: 05/31/22 Potential to Achieve Goals: Good Progress towards PT goals: Progressing toward goals    Frequency    7X/week      PT Plan Discharge plan needs to be updated    Co-evaluation              AM-PAC PT "6 Clicks" Mobility   Outcome Measure  Help needed turning from your back to your side while in a flat bed without using bedrails?: None Help needed moving from lying on your back to sitting on the side of a flat bed without using bedrails?: None Help needed moving to and from a bed to a chair (including a wheelchair)?: A Little Help needed standing up from a chair using your arms (e.g., wheelchair or bedside chair)?: A Little Help needed to walk in hospital room?: A Little Help needed climbing 3-5 steps with a railing? : A Little 6 Click Score: 20    End of Session Equipment Utilized During Treatment: Gait belt Activity Tolerance: Patient tolerated treatment well Patient left:  (sitting in transport chair with volunteer and NT present assisting pt with discharge) Nurse Communication: Mobility status;Precautions PT Visit Diagnosis: Muscle weakness (generalized) (M62.81);Difficulty in walking, not elsewhere classified (R26.2);Hemiplegia and hemiparesis Hemiplegia - Right/Left: Right Hemiplegia - dominant/non-dominant: Dominant Hemiplegia - caused by: Unspecified     Time: 2951-8841 PT Time Calculation (min) (ACUTE ONLY): 8 min  Charges:  $Therapeutic Activity: 8-22 mins                     Leitha Bleak, PT 05/18/22, 12:48 PM

## 2022-05-29 ENCOUNTER — Other Ambulatory Visit: Payer: Self-pay | Admitting: Internal Medicine

## 2022-06-06 ENCOUNTER — Other Ambulatory Visit: Payer: Self-pay | Admitting: Family Medicine

## 2022-06-06 DIAGNOSIS — G459 Transient cerebral ischemic attack, unspecified: Secondary | ICD-10-CM

## 2022-06-14 ENCOUNTER — Other Ambulatory Visit: Payer: Medicare HMO

## 2022-06-15 ENCOUNTER — Ambulatory Visit
Admission: RE | Admit: 2022-06-15 | Discharge: 2022-06-15 | Disposition: A | Discharge status: Home / Self Care | Payer: Medicare HMO | Source: Ambulatory Visit | Attending: Family Medicine | Admitting: Family Medicine

## 2022-06-15 DIAGNOSIS — G459 Transient cerebral ischemic attack, unspecified: Secondary | ICD-10-CM | POA: Insufficient documentation

## 2022-08-03 ENCOUNTER — Encounter: Payer: Self-pay | Admitting: Internal Medicine

## 2022-08-03 ENCOUNTER — Ambulatory Visit: Payer: Medicare HMO | Attending: Internal Medicine | Admitting: Internal Medicine

## 2022-08-03 VITALS — BP 130/64 | HR 98 | Ht 65.0 in | Wt 285.0 lb

## 2022-08-03 DIAGNOSIS — E1169 Type 2 diabetes mellitus with other specified complication: Secondary | ICD-10-CM

## 2022-08-03 DIAGNOSIS — I10 Essential (primary) hypertension: Secondary | ICD-10-CM

## 2022-08-03 DIAGNOSIS — E785 Hyperlipidemia, unspecified: Secondary | ICD-10-CM

## 2022-08-03 DIAGNOSIS — R072 Precordial pain: Secondary | ICD-10-CM

## 2022-08-03 DIAGNOSIS — I5032 Chronic diastolic (congestive) heart failure: Secondary | ICD-10-CM | POA: Diagnosis not present

## 2022-08-03 NOTE — Progress Notes (Signed)
Follow-up Outpatient Visit Date: 08/03/2022  Primary Care Provider: Leanna Sato, MD [address] [place] [address]  Chief Complaint: Follow-up chronic HFpEF and chest pain  HPI:  Kristine Rangel is a 66 y.o. female with history of chronic HFpEF, hypertension, hyperlipidemia, type 2 diabetes mellitus, cerebral aneurysm status post coiling, TIA, and COPD, who presents for follow-up of HFpEF and hypertension.  She was last seen in our office in March by Ward Givens, NP, at which time she was feeling fairly well.  She had previously complained of intermittent chest pain felt to be GI in nature given normal coronary CTA in 02/2021.  Isosorbide mononitrate and carvedilol were increased for treatment of hypertension as well as possible esophageal spasm.  She had not had any further chest pain at her last visit.  Blood pressure was mildly elevated, though home readings were typically lower.  Therefore, medication changes were deferred.  She was hospitalized this summer with headache, lightheadedness, nausea, and diaphoresis.  She had questionable slurred speech with near syncope while working with PT.  Work-up was unrevealing.  Today, Kristine Rangel reports that she has been feeling well.  She denies any chest pain since her last visit.  Chronic exertional dyspnea is unchanged from baseline.  She has been walking in her house more and is also made some dietary changes, which have led to a 16 pound weight loss since her last visit.  Chronic lymphedema is stable and well controlled with compression stockings as well as standing furosemide.  She has not had any palpitations or lightheadedness.  She does not check her blood pressure regularly at home.  --------------------------------------------------------------------------------------------------  Past Medical History:  Diagnosis Date   (HFpEF) heart failure with preserved ejection fraction (HCC)    a. 02/2021 Echo: EF 60-65%, no rwma, GrII DD. RVSP  32.85mmHg, mildly dil LA.   Anxiety    Arthritis    Asthma    Bladder leak    Cerebral aneurysm    history-has coils   Chest pain    a. 09/2008 Neg [place] Echo; b. 02/2021 Cor CTA: Ca2+ = 0. Nl cors.   Chronic kidney disease    COPD (chronic obstructive pulmonary disease) (HCC)    Depression    Diabetes mellitus without complication (HCC)    Dyspnea    GERD (gastroesophageal reflux disease)    Glaucoma    Headache    History of kidney stones    Hyperlipidemia    Hypertension    MI (myocardial infarction) (HCC)    unsure when   Neuropathy    Rotator cuff injury    Seasonal allergies    Stroke [facility])    TIA's   Past Surgical History:  Procedure Laterality Date   BRAIN SURGERY Right 2010   anuerysm repair   CARPAL TUNNEL RELEASE Right 06/12/2017   Procedure: CARPAL TUNNEL RELEASE;  Surgeon: Juanell Fairly, MD;  Location: [facility] ORS;  Service: Orthopedics;  Laterality: Right;   CHOLECYSTECTOMY     COLONOSCOPY WITH PROPOFOL N/A 08/09/2021   Procedure: COLONOSCOPY WITH PROPOFOL;  Surgeon: Regis Bill, MD;  Location: [facility] END[facility];  Service: Endoscopy;  Laterality: N/A;   ESOPHAGOGASTRODUODENOSCOPY (EGD) WITH PROPOFOL N/A 08/09/2021   Procedure: ESOPHAGOGASTRODUODENOSCOPY (EGD) WITH PROPOFOL;  Surgeon: Regis Bill, MD;  Location: [facility] ENDOSCOPY;  Service: Endoscopy;  Laterality: N/A;   JOINT REPLACEMENT Bilateral    TOTAL KNEE REPLACEMENT   SHOULDER ARTHROSCOPY WITH OPEN ROTATOR CUFF REPAIR Left 11/23/2016   Procedure: SHOULDER ARTHROSCOPY WITH  OPEN ROTATOR CUFF REPAIR, ARTHROSCOPIC SUBACROMIAL DECOMPRESSION, DISTAL CLAVICLE EXCISION;  Surgeon: Thornton Park, MD;  Location: [facility] ORS;  Service: Orthopedics;  Laterality: Left;   SHOULDER ARTHROSCOPY WITH OPEN ROTATOR CUFF REPAIR Right 07/30/2018   Procedure: SHOULDER ARTHROSCOPY WITH OPEN ROTATOR CUFF REPAIR,SUBACROMINAL DECOMPRESSION AND DISTAL CLAVICLE EXCISION;  Surgeon: Thornton Park, MD;  Location: [facility] OR[facility];   Service: Orthopedics;  Laterality: Right;   TOTAL KNEE REVISION Right 05/05/2020   Procedure: right total knee arthroplasty revision;  Surgeon: Lovell Sheehan, MD;  Location: [facility] OR[facility];  Service: Orthopedics;  Laterality: Right;    Current Meds  Medication Sig   albuterol (PROVENTIL) (2.5 MG/3ML) 0.083% nebulizer solution Take 2.5 mg by nebulization every 6 (six) hours as needed for wheezing or shortness of breath.   albuterol (VENTOLIN HFA) 108 (90 Base) MCG/ACT inhaler Inhale into the lungs every 6 (six) hours as needed for wheezing or shortness of breath.   aspirin EC 81 MG tablet Take 81 mg by mouth daily. Swallow whole.   atorvastatin (LIPITOR) 40 MG tablet Take 40 mg by mouth at bedtime.   brimonidine-timolol (COMBIGAN) 0.2-0.5 % ophthalmic solution Place 1 drop into both eyes 2 (two) times daily.   carvedilol (COREG) 6.25 MG tablet Take 1 tablet (6.25 mg total) by mouth 2 (two) times daily.   cetirizine (ZYRTEC) 10 MG tablet Take 10 mg by mouth daily.   cholecalciferol (VITAMIN D3) 25 MCG (1000 UNIT) tablet Take 1,000 Units by mouth daily.   dicyclomine (BENTYL) 10 MG capsule Take 10 mg by mouth 4 (four) times daily -  before meals and at bedtime.   DULoxetine (CYMBALTA) 60 MG capsule Take 60 mg by mouth at bedtime.    empagliflozin (JARDIANCE) 25 MG TABS tablet Take 25 mg by mouth daily.   EPINEPHrine 0.3 mg/0.3 mL IJ SOAJ injection Inject 0.3 mg into the skin as needed for anaphylaxis.    esomeprazole (NEXIUM) 40 MG capsule Take 40 mg by mouth 2 (two) times daily before a meal.   fluticasone (FLONASE) 50 MCG/ACT nasal spray Place 1 spray into both nostrils daily.    furosemide (LASIX) 40 MG tablet Take 40 mg by mouth 2 (two) times daily.    gabapentin (NEURONTIN) 300 MG capsule Take 300 mg by mouth every 8 (eight) hours.   HYDROcodone-acetaminophen (NORCO/VICODIN) 5-325 MG tablet Take 1 tablet by mouth every 6 (six) hours as needed for moderate pain.   isosorbide mononitrate (IMDUR)  60 MG 24 hr tablet Take 1.5 tablets (90 mg total) by mouth daily.   latanoprost (XALATAN) 0.005 % ophthalmic solution Place 1 drop into both eyes at bedtime.   montelukast (SINGULAIR) 10 MG tablet Take 10 mg by mouth at bedtime.   MYRBETRIQ 25 MG TB24 tablet TAKE 1 TABLET BY MOUTH EVERY DAY for bladder   Semaglutide,0.25 or 0.5MG /DOS, (OZEMPIC, 0.25 OR 0.5 MG/DOSE,) 2 MG/3ML SOPN Inject 0.25 mg into the skin once a week.   SYMBICORT 160-4.5 MCG/ACT inhaler Inhale 2 puffs into the lungs daily in the afternoon.   topiramate (TOPAMAX) 100 MG tablet Take 100 mg by mouth 2 (two) times daily.   valsartan (DIOVAN) 40 MG tablet TAKE 1 TABLET BY MOUTH DAILY   zolpidem (AMBIEN) 10 MG tablet Take 10 mg by mouth at bedtime as needed.    Allergies: Bee venom and Penicillins  Social History   Tobacco Use   Smoking status: Former   Smokeless tobacco: Current    Types: Snuff  Vaping Use   Vaping Use:  Never used  Substance Use Topics   Alcohol use: No   Drug use: No    Family History  Problem Relation Age of Onset   Breast cancer Maternal Aunt    Diabetes Mother    Heart attack Paternal Grandfather    Stroke Paternal Grandfather     Review of Systems: Kristine Rangel has been dealing with some ear issues and was recently advised that these could be due to environmental allergies.  --------------------------------------------------------------------------------------------------  Physical Exam: BP 130/64 (BP Location: Left Arm, Patient Position: Sitting, Cuff Size: Large)   Pulse 98   Ht 5\' 5"  (1.651 m)   Wt 285 lb (129.3 kg)   SpO2 98%   BMI 47.43 kg/m   General:  NAD. Neck: No JVD or HJR. Lungs: Clear to auscultation bilaterally without wheezes or crackles. Heart: Regular rate and rhythm with 2/6 systolic murmur. Abdomen: Soft, nontender, nondistended. Extremities: 1+ pretibial edema bilaterally with compression stockings in place.  EKG: Normal sinus rhythm without abnormalities.   Heart rate has increased since 05/17/2022.  PACs are no longer present.  Lab Results  Component Value Date   WBC 4.6 05/17/2022   HGB 12.3 05/17/2022   HCT 39.7 05/17/2022   MCV 84.3 05/17/2022   PLT 342 05/17/2022    Lab Results  Component Value Date   NA 141 05/17/2022   K 4.7 05/17/2022   CL 105 05/17/2022   CO2 30 05/17/2022   BUN 16 05/17/2022   CREATININE 1.16 (H) 05/17/2022   GLUCOSE 93 05/17/2022   ALT 21 01/28/2021    Lab Results  Component Value Date   CHOL 155 05/17/2022   HDL 53 05/17/2022   LDLCALC 81 05/17/2022   TRIG 103 05/17/2022   CHOLHDL 2.9 05/17/2022    --------------------------------------------------------------------------------------------------  ASSESSMENT AND PLAN: Chronic HFpEF: Volume status appears stable with chronic lower extremity edema likely largely due to lymphedema.  Kristine Rangel has stable NYHA class II symptoms.  I encouraged her to continue walking and to work on weight loss.  We will defer changes to her current regimen of carvedilol, empagliflozin, furosemide, and valsartan.  Chest pain: No further chest pain reported.  Suspect noncardiac etiology given coronary CTA without significant CAD in 02/2021.  Hypertension: Blood pressure borderline elevated (goal less than 130/80).  Continue current medications.  Hyperlipidemia associated with type 2 diabetes mellitus: Lipids well controlled on last check in July.  Continue atorvastatin and management of DM per Dr. August.  Morbid obesity: BMI remains greater than 45 with multiple comorbidities.  Kristine Rangel has lost about 16 pounds since her last visit with Tresa Res through diet and exercise.  I have encouraged her to keep working on this.  Follow-up: Return to clinic in 6 months.  [place], MD 08/03/2022 11:29 AM

## 2022-08-03 NOTE — Patient Instructions (Signed)

## 2022-08-31 ENCOUNTER — Other Ambulatory Visit: Payer: Self-pay | Admitting: Family Medicine

## 2022-08-31 DIAGNOSIS — Z1231 Encounter for screening mammogram for malignant neoplasm of breast: Secondary | ICD-10-CM

## 2022-08-31 DIAGNOSIS — Z780 Asymptomatic menopausal state: Secondary | ICD-10-CM

## 2022-09-13 ENCOUNTER — Other Ambulatory Visit: Payer: Self-pay | Admitting: Internal Medicine

## 2022-09-14 ENCOUNTER — Other Ambulatory Visit: Payer: Self-pay

## 2022-09-14 MED ORDER — CARVEDILOL 6.25 MG PO TABS: 6.2500 mg | ORAL_TABLET | Freq: Two times a day (BID) | ORAL | 0 refills | Status: DC

## 2022-11-08 ENCOUNTER — Ambulatory Visit
Admission: RE | Admit: 2022-11-08 | Discharge: 2022-11-08 | Disposition: A | Discharge status: Home / Self Care | Payer: Medicare Other | Source: Ambulatory Visit | Attending: Family Medicine | Admitting: Family Medicine

## 2022-11-08 DIAGNOSIS — Z780 Asymptomatic menopausal state: Secondary | ICD-10-CM | POA: Diagnosis present

## 2022-11-08 DIAGNOSIS — Z1231 Encounter for screening mammogram for malignant neoplasm of breast: Secondary | ICD-10-CM | POA: Diagnosis present

## 2022-11-28 ENCOUNTER — Other Ambulatory Visit: Payer: Self-pay | Admitting: Nurse Practitioner

## 2023-01-03 ENCOUNTER — Ambulatory Visit: Payer: Medicare HMO | Admitting: Cardiology

## 2023-01-16 ENCOUNTER — Other Ambulatory Visit: Payer: Self-pay

## 2023-01-16 MED ORDER — CARVEDILOL 6.25 MG PO TABS: 6.2500 mg | ORAL_TABLET | Freq: Two times a day (BID) | ORAL | 0 refills | Status: DC

## 2023-02-02 ENCOUNTER — Other Ambulatory Visit
Admission: RE | Admit: 2023-02-02 | Discharge: 2023-02-02 | Disposition: A | Discharge status: Home / Self Care | Payer: 59 | Source: Ambulatory Visit | Attending: Internal Medicine | Admitting: Internal Medicine

## 2023-02-02 ENCOUNTER — Encounter: Payer: Self-pay | Admitting: Internal Medicine

## 2023-02-02 ENCOUNTER — Ambulatory Visit: Payer: 59 | Attending: Internal Medicine | Admitting: Internal Medicine

## 2023-02-02 VITALS — BP 122/86 | HR 70 | Ht 64.0 in | Wt 279.0 lb

## 2023-02-02 DIAGNOSIS — Z79899 Other long term (current) drug therapy: Secondary | ICD-10-CM | POA: Insufficient documentation

## 2023-02-02 DIAGNOSIS — E1169 Type 2 diabetes mellitus with other specified complication: Secondary | ICD-10-CM

## 2023-02-02 DIAGNOSIS — E785 Hyperlipidemia, unspecified: Secondary | ICD-10-CM

## 2023-02-02 DIAGNOSIS — I10 Essential (primary) hypertension: Secondary | ICD-10-CM

## 2023-02-02 DIAGNOSIS — I5033 Acute on chronic diastolic (congestive) heart failure: Secondary | ICD-10-CM

## 2023-02-02 DIAGNOSIS — M7989 Other specified soft tissue disorders: Secondary | ICD-10-CM

## 2023-02-02 LAB — BASIC METABOLIC PANEL
Anion gap: 9 (ref 5–15)
BUN: 17 mg/dL (ref 8–23)
CO2: 27 mmol/L (ref 22–32)
Calcium: 9.8 mg/dL (ref 8.9–10.3)
Chloride: 101 mmol/L (ref 98–111)
Creatinine, Ser: 1.2 mg/dL — ABNORMAL HIGH (ref 0.44–1.00)
GFR, Estimated: 49 mL/min — ABNORMAL LOW (ref >60)
Glucose, Bld: 81 mg/dL (ref 70–99)
Potassium: 3.7 mmol/L (ref 3.5–5.1)
Sodium: 137 mmol/L (ref 135–145)

## 2023-02-02 NOTE — Progress Notes (Unsigned)
Follow-up Outpatient Visit Date: 02/02/2023  Primary Care Provider: Marguerita Merles, [place] [id number]  Chief Complaint: Follow-up HFpEF and hypertension  HPI:  Kristine Rangel is a 67 y.o. female with history of chronic HFpEF, hypertension, hyperlipidemia, type 2 diabetes mellitus, cerebral aneurysm status post coiling, TIA, and COPD, who presents for follow-up of HFpEF and hypertension.  I last saw her 07/2022, which time she was feeling fairly well.  Chronic exertional dyspnea was unchanged from baseline.  She happily reported a 16 pound weight loss through lifestyle modifications.  We did not make any medication changes or pursue additional testing.  Today, Kristine Rangel reports that she has been feeling fairly well though she has been bothered by pain in her right hip.  She was seen in the [facility] ED and diagnosed with arthritis.  She is scheduled for orthopedics consultation at [facility] next month.  She has been using meloxicam with some improvement.  She denies chest pain.  She was mildly short of breath in the setting of her hip pain, though this has resolved.  She is currently taking her furosemide twice a day but notes that her leg swelling is a little worse than baseline.  She has 3 pillow orthopnea that is a little worse than her baseline.  Is still using smokeless tobacco but is trying to quit  --------------------------------------------------------------------------------------------------  Past Medical History:  Diagnosis Date   (HFpEF) heart failure with preserved ejection fraction ([place])    a. 02/2021 Echo: EF 60-65%, no rwma, GrII DD. RVSP 32.55mmHg, mildly dil LA.   Anxiety    Arthritis    Asthma    Bladder leak    Cerebral aneurysm    history-has coils   Chest pain    a. 09/2008 Neg [place] Echo; b. 02/2021 Cor CTA: Ca2+ = 0. Nl cors.   Chronic kidney disease    COPD (chronic obstructive pulmonary disease) (HCC)    Depression    Diabetes mellitus  without complication (HCC)    Dyspnea    GERD (gastroesophageal reflux disease)    Glaucoma    Headache    History of kidney stones    Hyperlipidemia    Hypertension    MI (myocardial infarction) ([place])    unsure when   Neuropathy    Rotator cuff injury    Seasonal allergies    Stroke [facility])    TIA's   Past Surgical History:  Procedure Laterality Date   BRAIN SURGERY Right 2010   anuerysm repair   CARPAL TUNNEL RELEASE Right 06/12/2017   Procedure: CARPAL TUNNEL RELEASE;  Surgeon: Thornton Park, MD;  Location: [facility] ORS;  Service: Orthopedics;  Laterality: Right;   CHOLECYSTECTOMY     COLONOSCOPY WITH PROPOFOL N/A 08/09/2021   Procedure: COLONOSCOPY WITH PROPOFOL;  Surgeon: Lesly Rubenstein, MD;  Location: [facility] ENDOSCOPY;  Service: Endoscopy;  Laterality: N/A;   ESOPHAGOGASTRODUODENOSCOPY (EGD) WITH PROPOFOL N/A 08/09/2021   Procedure: ESOPHAGOGASTRODUODENOSCOPY (EGD) WITH PROPOFOL;  Surgeon: Lesly Rubenstein, MD;  Location: [facility] ENDOSCOPY;  Service: Endoscopy;  Laterality: N/A;   JOINT REPLACEMENT Bilateral    TOTAL KNEE REPLACEMENT   SHOULDER ARTHROSCOPY WITH OPEN ROTATOR CUFF REPAIR Left 11/23/2016   Procedure: SHOULDER ARTHROSCOPY WITH OPEN ROTATOR CUFF REPAIR, ARTHROSCOPIC SUBACROMIAL DECOMPRESSION, DISTAL CLAVICLE EXCISION;  Surgeon: Thornton Park, MD;  Location: [facility] ORS;  Service: Orthopedics;  Laterality: Left;   SHOULDER ARTHROSCOPY WITH OPEN ROTATOR CUFF REPAIR Right 07/30/2018   Procedure: SHOULDER ARTHROSCOPY WITH OPEN ROTATOR CUFF  REPAIR,SUBACROMINAL DECOMPRESSION AND DISTAL CLAVICLE EXCISION;  Surgeon: Thornton Park, MD;  Location: [facility] ORS;  Service: Orthopedics;  Laterality: Right;   TOTAL KNEE REVISION Right 05/05/2020   Procedure: right total knee arthroplasty revision;  Surgeon: Lovell Sheehan, MD;  Location: [facility] ORS;  Service: Orthopedics;  Laterality: Right;    Current Meds  Medication Sig   albuterol (PROVENTIL) (2.5 MG/3ML) 0.083% nebulizer  solution Take 2.5 mg by nebulization every 6 (six) hours as needed for wheezing or shortness of breath.   albuterol (VENTOLIN HFA) 108 (90 Base) MCG/ACT inhaler Inhale into the lungs every 6 (six) hours as needed for wheezing or shortness of breath.   aspirin EC 81 MG tablet Take 81 mg by mouth daily. Swallow whole.   atorvastatin (LIPITOR) 40 MG tablet Take 40 mg by mouth at bedtime.   brinzolamide (AZOPT) 1 % ophthalmic suspension 1 drop as directed.   carvedilol (COREG) 6.25 MG tablet Take 1 tablet (6.25 mg total) by mouth 2 (two) times daily.   cetirizine (ZYRTEC) 10 MG tablet Take 10 mg by mouth daily.   cholecalciferol (VITAMIN D3) 25 MCG (1000 UNIT) tablet Take 1,000 Units by mouth daily.   dicyclomine (BENTYL) 10 MG capsule Take 10 mg by mouth 4 (four) times daily -  before meals and at bedtime.   DULoxetine (CYMBALTA) 60 MG capsule Take 60 mg by mouth at bedtime.    empagliflozin (JARDIANCE) 25 MG TABS tablet Take 25 mg by mouth daily.   EPINEPHrine 0.3 mg/0.3 mL IJ SOAJ injection Inject 0.3 mg into the skin as needed for anaphylaxis.    esomeprazole (NEXIUM) 40 MG capsule Take 40 mg by mouth 2 (two) times daily before a meal.   fluticasone (FLONASE) 50 MCG/ACT nasal spray Place 1 spray into both nostrils daily.    furosemide (LASIX) 40 MG tablet Take 40 mg by mouth 2 (two) times daily.    gabapentin (NEURONTIN) 300 MG capsule Take 300 mg by mouth every 8 (eight) hours.   isosorbide mononitrate (IMDUR) 60 MG 24 hr tablet TAKE 1.5 TABLETS (90 MG TOTAL) BY MOUTH DAILY   latanoprost (XALATAN) 0.005 % ophthalmic solution Place 1 drop into both eyes at bedtime.   meloxicam (MOBIC) 7.5 MG tablet Take 7.5 mg by mouth 2 (two) times daily.   montelukast (SINGULAIR) 10 MG tablet Take 10 mg by mouth at bedtime.   MYRBETRIQ 25 MG TB24 tablet TAKE 1 TABLET BY MOUTH EVERY DAY for bladder   Semaglutide,0.25 or 0.5MG /DOS, (OZEMPIC, 0.25 OR 0.5 MG/DOSE,) 2 MG/3ML SOPN Inject 0.25 mg into the skin once a  week.   SYMBICORT 160-4.5 MCG/ACT inhaler Inhale 2 puffs into the lungs daily in the afternoon.   tizanidine (ZANAFLEX) 2 MG capsule Take 2 mg by mouth every 6 (six) hours as needed for muscle spasms.   topiramate (TOPAMAX) 50 MG tablet Take 50 mg by mouth at bedtime.   traMADol (ULTRAM) 50 MG tablet Take 50 mg by mouth every 6 (six) hours as needed.   valsartan (DIOVAN) 40 MG tablet TAKE 1 TABLET BY MOUTH DAILY   zolpidem (AMBIEN) 10 MG tablet Take 10 mg by mouth at bedtime as needed.    Allergies: Bee venom and Penicillins  Social History   Tobacco Use   Smoking status: Former   Smokeless tobacco: Current    Types: Snuff  Vaping Use   Vaping Use: Never used  Substance Use Topics   Alcohol use: No   Drug use: No  Family History  Problem Relation Age of Onset   Breast cancer Maternal Aunt    Diabetes Mother    Heart attack Paternal Grandfather    Stroke Paternal Grandfather     Review of Systems: A 12-system review of systems was performed and was negative except as noted in the HPI.  --------------------------------------------------------------------------------------------------  Physical Exam: BP 122/86 (BP Location: Left Arm, Patient Position: Sitting, Cuff Size: Large)   Pulse 70   Ht 5\' 4"  (1.626 m)   Wt 279 lb (126.6 kg)   SpO2 98%   BMI 47.89 kg/m   General:  NAD. Neck: Mild JVD, though body habitus limits evaluation. Lungs: Clear to auscultation bilaterally without wheezes or crackles. Heart: Regular rate and rhythm without murmurs, rubs, or gallops. Abdomen: Soft, nontender, nondistended. Extremities: Trace non-pitting edema in both calves.  EKG: Normal sinus rhythm without abnormality.  Lab Results  Component Value Date   WBC 4.6 05/17/2022   HGB 12.3 05/17/2022   HCT 39.7 05/17/2022   MCV 84.3 05/17/2022   PLT 342 05/17/2022    Lab Results  Component Value Date   NA 141 05/17/2022   K 4.7 05/17/2022   CL 105 05/17/2022   CO2 30  05/17/2022   BUN 16 05/17/2022   CREATININE 1.16 (H) 05/17/2022   GLUCOSE 93 05/17/2022   ALT 21 01/28/2021    Lab Results  Component Value Date   CHOL 155 05/17/2022   HDL 53 05/17/2022   LDLCALC 81 05/17/2022   TRIG 103 05/17/2022   CHOLHDL 2.9 05/17/2022    --------------------------------------------------------------------------------------------------  ASSESSMENT AND PLAN: Acute on chronic HFpEF: Kristine Rangel reports some increased shortness of breath over the last few weeks as well as more leg edema and orthopnea.  It is possible that meloxicam is contributing to some fluid retention, though some of the symptoms also predate addition of this medication.  I suspect lymphedema is playing a role as well.  I have encouraged her to try taking furosemide 80 mg every morning and 40 mg every afternoon for a few days to see if this helps.  She can then return back to her 40 mg twice daily dosing.  If she finds that she is having to use the 80 mg in the morning on a regular basis, she should let us know.  Otherwise, we will continue her current regimen of carvedilol, empagliflozin, and valsartan.  I will check a BMP today to ensure that her kidney function electrolytes are stable in the setting of meloxicam use and plans to temporarily increase furosemide.  Hypertension: Diastolic blood pressure mildly elevated today.  This may be exacerbated by her hip pain and NSAID use.  We will not change her antihypertensive regimen today.  Hyperlipidemia with type 2 diabetes mellitus: Continue atorvastatin 40 mg daily.  Consider escalation of atorvastatin to achieve target LDL less than 70 with history of stroke.  I will defer ongoing lipid and diabetes therapy to Kristine Rangel's PCP.  Morbid obesity: BMI remains greater than 40.  Weight loss encouraged through diet and exercise.  Follow-up: Return to clinic in 3 months.  Nelva Bush, MD 02/02/2023 10:12 AM

## 2023-02-02 NOTE — Patient Instructions (Signed)
Medication Instructions:  Your physician recommends the following medication changes.  INCREASE: Lasix to 80 mg in the morning and 40 mg at night for 3-4 days, then decrease back to 40 mg twice a day. If swelling doesn't resolve, please call our office.   *If you need a refill on your cardiac medications before your next appointment, please call your pharmacy*   Lab Work: Your provider would like for you to have following labs drawn: (BMP).   Please go to the [facility] entrance and check in at the front desk.  You do not need an appointment.  They are open from 7am-6 pm.    If you have labs (blood work) drawn today and your tests are completely normal, you will receive your results only by: [place] (if you have MyChart) OR A paper copy in the mail If you have any lab test that is abnormal or we need to change your treatment, we will call you to review the results.   Testing/Procedures: None ordered today   Follow-Up: At [facility], you and your health needs are our priority.  As part of our continuing mission to provide you with exceptional heart care, we have created designated Provider Care Teams.  These Care Teams include your primary Cardiologist (physician) and Advanced Practice Providers (APPs -  Physician Assistants and Nurse Practitioners) who all work together to provide you with the care you need, when you need it.  We recommend signing up for the patient portal called "MyChart".  Sign up information is provided on this After Visit Summary.  MyChart is used to connect with patients for Virtual Visits (Telemedicine).  Patients are able to view lab/test results, encounter notes, upcoming appointments, etc.  Non-urgent messages can be sent to your provider as well.   To learn more about what you can do with MyChart, go to [email or web address].    Your next appointment:   3 month(s)  Provider:   You may see Nelva Bush, MD or one of the  following Advanced Practice Providers on your designated Care Team:   Murray Hodgkins, NP Christell Faith, PA-C Cadence Kathlen Mody, PA-C Gerrie Nordmann, NP

## 2023-02-03 ENCOUNTER — Encounter: Payer: Self-pay | Admitting: Internal Medicine

## 2023-02-15 ENCOUNTER — Ambulatory Visit: Payer: 59 | Attending: Cardiology | Admitting: Cardiology

## 2023-02-15 NOTE — Progress Notes (Signed)
This encounter was created in error - please disregard.

## 2023-03-08 ENCOUNTER — Other Ambulatory Visit: Payer: Self-pay | Admitting: Internal Medicine

## 2023-05-07 ENCOUNTER — Encounter: Payer: Self-pay | Admitting: Cardiology

## 2023-05-07 ENCOUNTER — Ambulatory Visit: Payer: 59 | Attending: Cardiology | Admitting: Cardiology

## 2023-05-07 VITALS — BP 87/47 | HR 85 | Ht 64.0 in | Wt 270.6 lb

## 2023-05-07 DIAGNOSIS — I10 Essential (primary) hypertension: Secondary | ICD-10-CM

## 2023-05-07 DIAGNOSIS — G4733 Obstructive sleep apnea (adult) (pediatric): Secondary | ICD-10-CM | POA: Diagnosis not present

## 2023-05-07 NOTE — Addendum Note (Signed)
Addended by: Luellen Pucker on: 05/07/2023 10:44 AM   Modules accepted: Orders

## 2023-05-07 NOTE — Progress Notes (Signed)
Date:  05/07/2023   ID:  Kristine Rangel, DOB 1955/12/27, MRN [id number] The patient was identified using 2 identifiers.  PCP:  Leanna Sato, MD   [facility] Providers Cardiologist:  Yvonne Kendall, MD     Evaluation Performed:  Follow-Up Visit  Chief Complaint:  OSA  History of Present Illness:    Kristine Rangel is a 67 y.o. female with CHF, CKD, DM, COPD, HTN, CVA who was referred for evaluation of OSA.  She underwent home sleep study showing mild OSA with an AHI of 9.3/hr, no central apnea and no significant hypoxemia.  She was started on auto CPAP and is now here for followup.   She is doing well with her PAP device and thinks that she has gotten used to it.  She tolerates the mask and feels the pressure is adequate.  Since going on PAP She feels rested in the am but still has some daytime sleepiness and has to nap  She denies any significant mouth or nasal dryness or nasal congestion.  She does not think that he snores.    Past Medical History:  Diagnosis Date   (HFpEF) heart failure with preserved ejection fraction (HCC)    a. 02/2021 Echo: EF 60-65%, no rwma, GrII DD. RVSP 32.15mmHg, mildly dil LA.   Anxiety    Arthritis    Asthma    Bladder leak    Cerebral aneurysm    history-has coils   Chest pain    a. 09/2008 Neg [place] Echo; b. 02/2021 Cor CTA: Ca2+ = 0. Nl cors.   Chronic kidney disease    COPD (chronic obstructive pulmonary disease) (HCC)    Depression    Diabetes mellitus without complication (HCC)    Dyspnea    GERD (gastroesophageal reflux disease)    Glaucoma    Headache    History of kidney stones    Hyperlipidemia    Hypertension    MI (myocardial infarction) (HCC)    unsure when   Neuropathy    Rotator cuff injury    Seasonal allergies    Stroke [facility])    TIA's   Past Surgical History:  Procedure Laterality Date   BRAIN SURGERY Right 2010   anuerysm repair   CARPAL TUNNEL RELEASE Right 06/12/2017   Procedure: CARPAL TUNNEL RELEASE;   Surgeon: Juanell Fairly, MD;  Location: [facility] ORS;  Service: Orthopedics;  Laterality: Right;   CHOLECYSTECTOMY     COLONOSCOPY WITH PROPOFOL N/A 08/09/2021   Procedure: COLONOSCOPY WITH PROPOFOL;  Surgeon: Regis Bill, MD;  Location: [facility] ENDOSCOPY;  Service: Endoscopy;  Laterality: N/A;   ESOPHAGOGASTRODUODENOSCOPY (EGD) WITH PROPOFOL N/A 08/09/2021   Procedure: ESOPHAGOGASTRODUODENOSCOPY (EGD) WITH PROPOFOL;  Surgeon: Regis Bill, MD;  Location: [facility] ENDOSCOPY;  Service: Endoscopy;  Laterality: N/A;   JOINT REPLACEMENT Bilateral    TOTAL KNEE REPLACEMENT   SHOULDER ARTHROSCOPY WITH OPEN ROTATOR CUFF REPAIR Left 11/23/2016   Procedure: SHOULDER ARTHROSCOPY WITH OPEN ROTATOR CUFF REPAIR, ARTHROSCOPIC SUBACROMIAL DECOMPRESSION, DISTAL CLAVICLE EXCISION;  Surgeon: Juanell Fairly, MD;  Location: [facility] ORS;  Service: Orthopedics;  Laterality: Left;   SHOULDER ARTHROSCOPY WITH OPEN ROTATOR CUFF REPAIR Right 07/30/2018   Procedure: SHOULDER ARTHROSCOPY WITH OPEN ROTATOR CUFF REPAIR,SUBACROMINAL DECOMPRESSION AND DISTAL CLAVICLE EXCISION;  Surgeon: Juanell Fairly, MD;  Location: [facility] ORS;  Service: Orthopedics;  Laterality: Right;   TOTAL KNEE REVISION Right 05/05/2020   Procedure: right total knee arthroplasty revision;  Surgeon: Lyndle Herrlich, MD;  Location: [facility] ORS;  Service:  Orthopedics;  Laterality: Right;     Current Meds  Medication Sig   albuterol (PROVENTIL) (2.5 MG/3ML) 0.083% nebulizer solution Take 2.5 mg by nebulization every 6 (six) hours as needed for wheezing or shortness of breath.   albuterol (VENTOLIN HFA) 108 (90 Base) MCG/ACT inhaler Inhale into the lungs every 6 (six) hours as needed for wheezing or shortness of breath.   aspirin EC 81 MG tablet Take 81 mg by mouth daily. Swallow whole.   atorvastatin (LIPITOR) 40 MG tablet Take 40 mg by mouth at bedtime.   brinzolamide (AZOPT) 1 % ophthalmic suspension 1 drop as directed.   carvedilol (COREG) 6.25 MG tablet  Take 1 tablet (6.25 mg total) by mouth 2 (two) times daily.   cetirizine (ZYRTEC) 10 MG tablet Take 10 mg by mouth daily.   cholecalciferol (VITAMIN D3) 25 MCG (1000 UNIT) tablet Take 1,000 Units by mouth daily.   dicyclomine (BENTYL) 10 MG capsule Take 10 mg by mouth 4 (four) times daily -  before meals and at bedtime.   DULoxetine (CYMBALTA) 60 MG capsule Take 60 mg by mouth at bedtime.    empagliflozin (JARDIANCE) 25 MG TABS tablet Take 25 mg by mouth daily.   EPINEPHrine 0.3 mg/0.3 mL IJ SOAJ injection Inject 0.3 mg into the skin as needed for anaphylaxis.    esomeprazole (NEXIUM) 40 MG capsule Take 40 mg by mouth 2 (two) times daily before a meal.   fluticasone (FLONASE) 50 MCG/ACT nasal spray Place 1 spray into both nostrils daily.    furosemide (LASIX) 40 MG tablet Take 40 mg by mouth 2 (two) times daily.    gabapentin (NEURONTIN) 300 MG capsule Take 300 mg by mouth every 8 (eight) hours.   HYDROcodone-acetaminophen (NORCO/VICODIN) 5-325 MG tablet Take 1 tablet by mouth every 8 (eight) hours as needed for severe pain or moderate pain.   isosorbide mononitrate (IMDUR) 60 MG 24 hr tablet TAKE 1.5 TABLETS (90 MG TOTAL) BY MOUTH DAILY   latanoprost (XALATAN) 0.005 % ophthalmic solution Place 1 drop into both eyes at bedtime.   lidocaine (LIDODERM) 5 % Place 2 patches onto the skin daily.   meloxicam (MOBIC) 7.5 MG tablet Take 7.5 mg by mouth 2 (two) times daily.   montelukast (SINGULAIR) 10 MG tablet Take 10 mg by mouth at bedtime.   MYRBETRIQ 25 MG TB24 tablet TAKE 1 TABLET BY MOUTH EVERY DAY for bladder   Semaglutide,0.25 or 0.5MG /DOS, (OZEMPIC, 0.25 OR 0.5 MG/DOSE,) 2 MG/3ML SOPN Inject 0.25 mg into the skin once a week.   SYMBICORT 160-4.5 MCG/ACT inhaler Inhale 2 puffs into the lungs daily in the afternoon.   tizanidine (ZANAFLEX) 2 MG capsule Take 2 mg by mouth every 6 (six) hours as needed for muscle spasms.   topiramate (TOPAMAX) 50 MG tablet Take 50 mg by mouth at bedtime.    valsartan (DIOVAN) 40 MG tablet TAKE 1 TABLET BY MOUTH DAILY   zolpidem (AMBIEN) 10 MG tablet Take 10 mg by mouth at bedtime as needed.     Allergies:   Bee venom and Penicillins   Social History   Tobacco Use   Smoking status: Former   Smokeless tobacco: Current    Types: Snuff  Vaping Use   Vaping Use: Never used  Substance Use Topics   Alcohol use: No   Drug use: No     Family Hx: The patient's family history includes Breast cancer in her maternal aunt; Diabetes in her mother; Heart attack in her paternal grandfather;  Stroke in her paternal grandfather.  ROS:   Please see the history of present illness.     All other systems reviewed and are negative.   Prior CV studies:   The following studies were reviewed today:  Home sleep study and PAP compliance download  Labs/Other Tests and Data Reviewed:    EKG:  No ECG reviewed.  Recent Labs: 05/17/2022: Hemoglobin 12.3; Platelets 342 02/02/2023: BUN 17; Creatinine, Ser 1.20; Potassium 3.7; Sodium 137   Recent Lipid Panel Lab Results  Component Value Date/Time   CHOL 155 05/17/2022 04:05 AM   TRIG 103 05/17/2022 04:05 AM   HDL 53 05/17/2022 04:05 AM   CHOLHDL 2.9 05/17/2022 04:05 AM   LDLCALC 81 05/17/2022 04:05 AM    Wt Readings from Last 3 Encounters:  05/07/23 270 lb 9.6 oz (122.7 kg)  02/15/23 266 lb (120.7 kg)  02/02/23 279 lb (126.6 kg)     Risk Assessment/Calculations:          Objective:    Vital Signs:  BP (!) 87/47   Pulse 85   Ht 5\' 4"  (1.626 m)   Wt 270 lb 9.6 oz (122.7 kg)   SpO2 99%   BMI 46.45 kg/m   GEN: Well nourished, well developed in no acute distress HEENT: Normal NECK: No JVD; No carotid bruits LYMPHATICS: No lymphadenopathy CARDIAC:RRR, no murmurs, rubs, gallops RESPIRATORY:  Clear to auscultation without rales, wheezing or rhonchi  ABDOMEN: Soft, non-tender, non-distended MUSCULOSKELETAL:  No edema; No deformity  SKIN: Warm and dry NEUROLOGIC:  Alert and oriented x  3 PSYCHIATRIC:  Normal affect  ASSESSMENT & PLAN:     OSA - The patient is tolerating PAP therapy well without any problems. The PAP download performed by his DME was personally reviewed and interpreted by me today and showed an AHI of 2.3/hr on auto CPAP from 4-18cm H2O with 18% compliance in using more than 4 hours nightly.  The patient has been using and benefiting from PAP use and will continue to benefit from therapy.  -she has not been compliant with her device because she was sick a few months ago and then a part was missing from her device -she has only been using her device on average 2 hours nightly.  She wakes up with her mask off at night -I  encouraged her to try to use her device at least 5 hours nightly  HTN -BP meds were adjusted a few months ago by her PCP and now BP has been running low and she has had some dizziness -Continue prescription drug management with carvedilol 6.25 mg twice daily, Imdur 90 mg daily with as needed refills -I have instructed her to stop her Valsartan and followup with her PCP later this week    COVID-19 Education: The signs and symptoms of COVID-19 were discussed with the patient and how to seek care for testing (follow up with PCP or arrange E-visit).  The importance of social distancing was discussed today.  Time:   Today, I have spent 15 minutes with the patient with telehealth technology discussing the above problems.     Medication Adjustments/Labs and Tests Ordered: Current medicines are reviewed at length with the patient today.  Concerns regarding medicines are outlined above.   Tests Ordered: No orders of the defined types were placed in this encounter.    Medication Changes: No orders of the defined types were placed in this encounter.    Follow Up:  In Person in 1 year(s)  Signed, Armanda Magic, MD  05/07/2023 10:21 AM    [facility]

## 2023-05-07 NOTE — Patient Instructions (Addendum)
Medication Instructions:  Please STOP taking valsartan.  *If you need a refill on your cardiac medications before your next appointment, please call your pharmacy*   Lab Work: None.  If you have labs (blood work) drawn today and your tests are completely normal, you will receive your results only by: MyChart Message (if you have MyChart) OR A paper copy in the mail If you have any lab test that is abnormal or we need to change your treatment, we will call you to review the results.   Testing/Procedures: None.   Follow-Up: At [facility], you and your health needs are our priority.  As part of our continuing mission to provide you with exceptional heart care, we have created designated Provider Care Teams.  These Care Teams include your primary Cardiologist (physician) and Advanced Practice Providers (APPs -  Physician Assistants and Nurse Practitioners) who all work together to provide you with the care you need, when you need it.  We recommend signing up for the patient portal called "MyChart".  Sign up information is provided on this After Visit Summary.  MyChart is used to connect with patients for Virtual Visits (Telemedicine).  Patients are able to view lab/test results, encounter notes, upcoming appointments, etc.  Non-urgent messages can be sent to your provider as well.   To learn more about what you can do with MyChart, go to [email or web address].    Your next appointment:   1 year(s)  Provider:   Dr. Armanda Magic, MD   Other Instructions Please call your PCP and ask for an appointment on Wednesday or Thursday of this week. Your PCP needs to follow up on the changes that were made to your blood pressure regimen today.   We have also referred you to our hypertension clinic. Someone from our hypertension clinic will call you to set up an appointment to help monitor your blood pressure.

## 2023-05-07 NOTE — Addendum Note (Signed)
Addended by: Luellen Pucker on: 05/07/2023 11:07 AM   Modules accepted: Orders

## 2023-05-08 ENCOUNTER — Ambulatory Visit: Payer: 59

## 2023-05-08 ENCOUNTER — Telehealth: Payer: Self-pay | Admitting: Pharmacist

## 2023-05-08 NOTE — Telephone Encounter (Signed)
Patient was seen yesterday by Dr. Mendel Corning. BP was low and valsartan was stopped. Since med was just stopped yesterday, no need to see [place] today. She see Dr. Okey Dupre and PCP on Thursday. She was reminded to not take valsartan. Apt rescheduled for 7/18.

## 2023-05-10 ENCOUNTER — Other Ambulatory Visit
Admission: RE | Admit: 2023-05-10 | Discharge: 2023-05-10 | Disposition: A | Discharge status: Home / Self Care | Payer: 59 | Source: Ambulatory Visit | Attending: Internal Medicine | Admitting: Internal Medicine

## 2023-05-10 ENCOUNTER — Ambulatory Visit: Payer: 59 | Attending: Internal Medicine | Admitting: Internal Medicine

## 2023-05-10 ENCOUNTER — Encounter: Payer: Self-pay | Admitting: Internal Medicine

## 2023-05-10 VITALS — BP 124/86 | HR 74 | Ht 63.0 in | Wt 272.0 lb

## 2023-05-10 DIAGNOSIS — E785 Hyperlipidemia, unspecified: Secondary | ICD-10-CM

## 2023-05-10 DIAGNOSIS — G4733 Obstructive sleep apnea (adult) (pediatric): Secondary | ICD-10-CM

## 2023-05-10 DIAGNOSIS — I5032 Chronic diastolic (congestive) heart failure: Secondary | ICD-10-CM

## 2023-05-10 DIAGNOSIS — I10 Essential (primary) hypertension: Secondary | ICD-10-CM | POA: Diagnosis not present

## 2023-05-10 DIAGNOSIS — Z7985 Long-term (current) use of injectable non-insulin antidiabetic drugs: Secondary | ICD-10-CM

## 2023-05-10 DIAGNOSIS — E1169 Type 2 diabetes mellitus with other specified complication: Secondary | ICD-10-CM

## 2023-05-10 DIAGNOSIS — I110 Hypertensive heart disease with heart failure: Secondary | ICD-10-CM | POA: Insufficient documentation

## 2023-05-10 DIAGNOSIS — Z79899 Other long term (current) drug therapy: Secondary | ICD-10-CM

## 2023-05-10 LAB — COMPREHENSIVE METABOLIC PANEL
ALT: 17 U/L (ref 0–44)
AST: 18 U/L (ref 15–41)
Albumin: 3.8 g/dL (ref 3.5–5.0)
Alkaline Phosphatase: 82 U/L (ref 38–126)
Anion gap: 6 (ref 5–15)
BUN: 17 mg/dL (ref 8–23)
CO2: 24 mmol/L (ref 22–32)
Calcium: 10 mg/dL (ref 8.9–10.3)
Chloride: 110 mmol/L (ref 98–111)
Creatinine, Ser: 1.1 mg/dL — ABNORMAL HIGH (ref 0.44–1.00)
GFR, Estimated: 54 mL/min — ABNORMAL LOW (ref >60)
Glucose, Bld: 87 mg/dL (ref 70–99)
Potassium: 4 mmol/L (ref 3.5–5.1)
Sodium: 140 mmol/L (ref 135–145)
Total Bilirubin: 0.8 mg/dL (ref 0.3–1.2)
Total Protein: 7 g/dL (ref 6.5–8.1)

## 2023-05-10 LAB — LIPID PANEL
Cholesterol: 161 mg/dL (ref 0–200)
HDL: 59 mg/dL (ref >40)
LDL Cholesterol: 86 mg/dL (ref 0–99)
Total CHOL/HDL Ratio: 2.7 RATIO
Triglycerides: 82 mg/dL (ref <150)
VLDL: 16 mg/dL (ref 0–40)

## 2023-05-10 NOTE — Progress Notes (Signed)
Cardiology Office Note:  .   Date:  05/10/2023  ID:  LEIGHA OLBERDING, DOB 07-05-1956, MRN [id number] PCP: Leanna Sato, MD  [facility] Providers Cardiologist:  Yvonne Kendall, MD     History of Present Illness: .   Kristine Rangel is a 67 y.o. female with history of chronic HFpEF, hypertension, hyperlipidemia, type 2 diabetes mellitus, OSA, cerebral aneurysm status post coiling, TIA, and COPD, who returns for follow-up of HFpEF.  I last saw her in March, at which time she was most bothered by pain in the right hip.  Orthopedics consultation at [facility] was pending.  She reported some transient shortness of breath associated with her hip pain and was also noted to have more leg edema.  She also complained of increasing orthopnea.  I recommended taking furosemide 80 mg every morning and 40 mg every afternoon for a few days to see if this would help her swelling.  She saw Dr. Mayford Knife three days ago for evaluation of her sleep apnea, which time she was feeling fairly well.  She was noted to be hypotensive with a blood pressure of 87/47, prompting discontinuation of valsartan.  Today, Ms. Withrow states that she is feeling better than at her visit with Dr. Mayford Knife but still feels like her balance is a bit off.  She attributes this to her low blood pressure that has improved with discontinuation of valsartan.  She has not passed out or fallen.  She continues to have some lower extremity edema.  This improved with transient escalation of furosemide as outlined above, but is now back to her prior baseline.  She has been able to lose some weight, however.  She denies chest pain, shortness of breath, and palpitations.  ROS: See HPI  Studies Reviewed: .    EKG (02/02/2023): NSR without abnormalities.  TTE (05/17/2022): Normal LV size with mild LVH.  LVEF 60-65% with grade 1 diastolic dysfunction.  Normal RV size and function.  Normal biatrial size.  No pericardial effusion.  No significant valvular  abnormalities.  Coronary CTA (02/24/2021): Normal study without evidence of coronary artery stenosis or calcification.  No significant extracardiac findings in the visualized chest.  Risk Assessment/Calculations:             Physical Exam:   VS:  BP 124/86 (BP Location: Left Arm, Patient Position: Sitting, Cuff Size: Large)   Pulse 74   Ht 5\' 3"  (1.6 m)   Wt 272 lb (123.4 kg)   SpO2 98%   BMI 48.18 kg/m    Wt Readings from Last 3 Encounters:  05/10/23 272 lb (123.4 kg)  05/07/23 270 lb 9.6 oz (122.7 kg)  02/15/23 266 lb (120.7 kg)    General:  NAD. Neck: JVP approximately 6 cm with positive HJR. Lungs: Clear to auscultation bilaterally without wheezes or crackles. Heart: Regular rate and rhythm without murmurs, rubs, or gallops. Abdomen: Obese but soft, nontender, nondistended. Extremities: Trace nonpitting edema in both lower extremities.  ASSESSMENT AND PLAN: .    Chronic HFpEF: Ms. Hendricksen continues to struggle with lower extremity edema that is likely multifactorial including an element of HFpEF as well as possible venous insufficiency and lymphedema in the setting of her morbid obesity.  She experienced some improvement with escalation of furosemide to 80 mg every morning and 40 mg every afternoon.  I have advised her that it is okay to take 80 mg of furosemide in the morning when she is experiencing edema or has significant weight  gain.  I am hopeful that with continued weight loss that her propensity for edema will improve.  I will check a CMP today to ensure stable renal function and electrolytes.  Hypertension: Ms. Chohan was noted to have hypotension earlier this week at her visit with Dr. Mayford Knife in the setting of some dizziness.  This prompted discontinuation of valsartan, which I think is reasonable.  Ms. Ende is feeling better but not quite back to baseline today though her blood pressure is up (DBP actually a little bit above goal).  We will defer reinitiation of  valsartan today and continue her other medications.  Long-term, I would favor decreasing or even stopping isosorbide mononitrate in order to allow for reintroduction of an ARB given her history of diabetes mellitus.  This can be readdressed at her upcoming visit with Dr. Marvis Moeller next month and when she follows up with [place] in 3 months.  Hyperlipidemia associated with type 2 diabetes mellitus: I will check a CMP and lipid panel today for target LDL less than 70 in the setting of diabetes mellitus and TIA.  In the meantime, we will plan to continue atorvastatin 40 mg daily.  Obstructive sleep apnea: Ms. Vasudevan is tolerating CPAP well.  I encouraged her to continue using this and to follow-up with Dr. Mayford Knife as previously arranged.  Morbid obesity: BMI remains greater than 45, though Ms. Harriett Sine has lost some weight.  I congratulated her on this and encouraged her to keep working on weight loss through diet and exercise.    Dispo: Return to clinic in 3 months.  Signed, Yvonne Kendall, MD

## 2023-05-10 NOTE — Patient Instructions (Addendum)
Medication Instructions:  Your physician recommends that you continue on your current medications as directed. Please refer to the Current Medication list given to you today.  May take an additional 40 mg in the morning to total 80 mg as needed for swelling or weight gain of 2 lbs overnight or 5 lbs in a week.  Lasix as needed  80 mg morning  40 mg evening  *If you need a refill on your cardiac medications before your next appointment, please call your pharmacy*   Lab Work: Your provider would like for you to have following labs drawn: (CMP, Lipid).   Please go to the [facility] entrance and check in at the front desk.  You do not need an appointment.  They are open from 7am-6 pm.     Testing/Procedures: None ordered today   Follow-Up: At [facility], you and your health needs are our priority.  As part of our continuing mission to provide you with exceptional heart care, we have created designated Provider Care Teams.  These Care Teams include your primary Cardiologist (physician) and Advanced Practice Providers (APPs -  Physician Assistants and Nurse Practitioners) who all work together to provide you with the care you need, when you need it.  We recommend signing up for the patient portal called "MyChart".  Sign up information is provided on this After Visit Summary.  MyChart is used to connect with patients for Virtual Visits (Telemedicine).  Patients are able to view lab/test results, encounter notes, upcoming appointments, etc.  Non-urgent messages can be sent to your provider as well.   To learn more about what you can do with MyChart, go to [email or web address].    Your next appointment:   3 month(s)  Provider:   You may see Yvonne Kendall, MD or one of the following Advanced Practice Providers on your designated Care Team:   Nicolasa Ducking, NP Eula Listen, PA-C Cadence Fransico Michael, PA-C Charlsie Quest, NP

## 2023-05-14 ENCOUNTER — Telehealth: Payer: Self-pay | Admitting: *Deleted

## 2023-05-14 ENCOUNTER — Other Ambulatory Visit: Payer: Self-pay | Admitting: Internal Medicine

## 2023-05-14 DIAGNOSIS — Z79899 Other long term (current) drug therapy: Secondary | ICD-10-CM

## 2023-05-14 DIAGNOSIS — E1169 Type 2 diabetes mellitus with other specified complication: Secondary | ICD-10-CM

## 2023-05-14 MED ORDER — ATORVASTATIN CALCIUM 80 MG PO TABS: 80.0000 mg | ORAL_TABLET | Freq: Every day | ORAL | 3 refills | Status: DC

## 2023-05-14 NOTE — Telephone Encounter (Signed)
-----   Message from Yvonne Kendall, MD sent at 05/14/2023  7:03 AM EDT ----- Please let Ms. Whiteford know that her kidney function, liver function, and electrolytes are stable.  Her LDL (bad cholesterol) is a little above goal at 86, given her h/o TIA.  I recommend that we increase atorvastatin to 80 mg daily and repeat a lipid panel and ALT in ~3 months.  She should otherwise continue her current medications and follow-up as previously arranged.

## 2023-05-14 NOTE — Telephone Encounter (Signed)
Spoke with patient and reviewed results and recommendations. She verbalized agreement with no further questions at this time.

## 2023-05-31 ENCOUNTER — Ambulatory Visit: Payer: 59

## 2023-06-20 ENCOUNTER — Ambulatory Visit: Payer: 59

## 2023-07-09 ENCOUNTER — Other Ambulatory Visit: Payer: Self-pay | Admitting: Internal Medicine

## 2023-07-26 ENCOUNTER — Other Ambulatory Visit: Payer: Self-pay | Admitting: Surgery

## 2023-07-27 ENCOUNTER — Encounter
Admission: RE | Admit: 2023-07-27 | Discharge: 2023-07-27 | Disposition: A | Discharge status: Home / Self Care | Payer: 59 | Source: Ambulatory Visit | Attending: Surgery

## 2023-07-27 ENCOUNTER — Encounter: Payer: Self-pay | Admitting: Internal Medicine

## 2023-07-27 ENCOUNTER — Ambulatory Visit: Payer: 59 | Attending: Internal Medicine | Admitting: Internal Medicine

## 2023-07-27 ENCOUNTER — Other Ambulatory Visit: Payer: Self-pay

## 2023-07-27 VITALS — BP 114/81 | HR 67 | Temp 98.0°F | Resp 16 | Ht 63.0 in | Wt 280.0 lb

## 2023-07-27 VITALS — BP 126/78 | HR 76 | Ht 63.0 in | Wt 265.0 lb

## 2023-07-27 DIAGNOSIS — I130 Hypertensive heart and chronic kidney disease with heart failure and stage 1 through stage 4 chronic kidney disease, or unspecified chronic kidney disease: Secondary | ICD-10-CM | POA: Insufficient documentation

## 2023-07-27 DIAGNOSIS — I5032 Chronic diastolic (congestive) heart failure: Secondary | ICD-10-CM

## 2023-07-27 DIAGNOSIS — I340 Nonrheumatic mitral (valve) insufficiency: Secondary | ICD-10-CM | POA: Diagnosis not present

## 2023-07-27 DIAGNOSIS — E785 Hyperlipidemia, unspecified: Secondary | ICD-10-CM | POA: Diagnosis not present

## 2023-07-27 DIAGNOSIS — Z01810 Encounter for preprocedural cardiovascular examination: Secondary | ICD-10-CM | POA: Diagnosis not present

## 2023-07-27 DIAGNOSIS — E1169 Type 2 diabetes mellitus with other specified complication: Secondary | ICD-10-CM | POA: Diagnosis not present

## 2023-07-27 DIAGNOSIS — Z01818 Encounter for other preprocedural examination: Secondary | ICD-10-CM | POA: Diagnosis present

## 2023-07-27 DIAGNOSIS — N1831 Chronic kidney disease, stage 3a: Secondary | ICD-10-CM

## 2023-07-27 DIAGNOSIS — Z6841 Body Mass Index (BMI) 40.0 and over, adult: Secondary | ICD-10-CM | POA: Insufficient documentation

## 2023-07-27 DIAGNOSIS — E1122 Type 2 diabetes mellitus with diabetic chronic kidney disease: Secondary | ICD-10-CM | POA: Diagnosis not present

## 2023-07-27 DIAGNOSIS — I10 Essential (primary) hypertension: Secondary | ICD-10-CM

## 2023-07-27 DIAGNOSIS — Z01812 Encounter for preprocedural laboratory examination: Secondary | ICD-10-CM | POA: Diagnosis not present

## 2023-07-27 HISTORY — DX: Sleep apnea, unspecified: G47.30

## 2023-07-27 HISTORY — DX: Migraine with aura, not intractable, without status migrainosus: G43.109

## 2023-07-27 LAB — CBC WITH DIFFERENTIAL/PLATELET
Abs Immature Granulocytes: 0.01 10*3/uL (ref 0.00–0.07)
Basophils Absolute: 0 10*3/uL (ref 0.0–0.1)
Basophils Relative: 1 %
Eosinophils Absolute: 0.4 10*3/uL (ref 0.0–0.5)
Eosinophils Relative: 7 %
HCT: 38.4 % (ref 36.0–46.0)
Hemoglobin: 12.5 g/dL (ref 12.0–15.0)
Immature Granulocytes: 0 %
Lymphocytes Relative: 38 %
Lymphs Abs: 1.8 10*3/uL (ref 0.7–4.0)
MCH: 27.2 pg (ref 26.0–34.0)
MCHC: 32.6 g/dL (ref 30.0–36.0)
MCV: 83.7 fL (ref 80.0–100.0)
Monocytes Absolute: 0.5 10*3/uL (ref 0.1–1.0)
Monocytes Relative: 10 %
Neutro Abs: 2.1 10*3/uL (ref 1.7–7.7)
Neutrophils Relative %: 44 %
Platelets: 343 10*3/uL (ref 150–400)
RBC: 4.59 MIL/uL (ref 3.87–5.11)
RDW: 15.8 % — ABNORMAL HIGH (ref 11.5–15.5)
WBC: 4.9 10*3/uL (ref 4.0–10.5)
nRBC: 0 % (ref 0.0–0.2)

## 2023-07-27 LAB — COMPREHENSIVE METABOLIC PANEL
ALT: 19 U/L (ref 0–44)
AST: 19 U/L (ref 15–41)
Albumin: 3.8 g/dL (ref 3.5–5.0)
Alkaline Phosphatase: 86 U/L (ref 38–126)
Anion gap: 10 (ref 5–15)
BUN: 19 mg/dL (ref 8–23)
CO2: 27 mmol/L (ref 22–32)
Calcium: 10 mg/dL (ref 8.9–10.3)
Chloride: 102 mmol/L (ref 98–111)
Creatinine, Ser: 1.09 mg/dL — ABNORMAL HIGH (ref 0.44–1.00)
GFR, Estimated: 55 mL/min — ABNORMAL LOW (ref >60)
Glucose, Bld: 90 mg/dL (ref 70–99)
Potassium: 4.4 mmol/L (ref 3.5–5.1)
Sodium: 139 mmol/L (ref 135–145)
Total Bilirubin: 0.8 mg/dL (ref 0.3–1.2)
Total Protein: 6.8 g/dL (ref 6.5–8.1)

## 2023-07-27 LAB — TYPE AND SCREEN
ABO/RH(D): O POS
Antibody Screen: NEGATIVE

## 2023-07-27 LAB — URINALYSIS, ROUTINE W REFLEX MICROSCOPIC
Bacteria, UA: NONE SEEN
Bilirubin Urine: NEGATIVE
Glucose, UA: >=500 mg/dL — AB
Hgb urine dipstick: NEGATIVE
Ketones, ur: NEGATIVE mg/dL
Leukocytes,Ua: NEGATIVE
Nitrite: NEGATIVE
Protein, ur: NEGATIVE mg/dL
Specific Gravity, Urine: 1.018 (ref 1.005–1.030)
pH: 5 (ref 5.0–8.0)

## 2023-07-27 LAB — SURGICAL PCR SCREEN
MRSA, PCR: NEGATIVE
Staphylococcus aureus: POSITIVE — AB

## 2023-07-27 LAB — LIPID PANEL
Cholesterol: 168 mg/dL (ref 0–200)
HDL: 62 mg/dL (ref >40)
LDL Cholesterol: 83 mg/dL (ref 0–99)
Total CHOL/HDL Ratio: 2.7 ratio
Triglycerides: 116 mg/dL (ref <150)
VLDL: 23 mg/dL (ref 0–40)

## 2023-07-27 NOTE — Patient Instructions (Addendum)
Your procedure is scheduled on: Thursday, September 19 Report to the Registration Desk on the 1st floor of the [facility] To find out your arrival time, please call [phone] between 1PM - 3PM on: Wednesday, September 18 If your arrival time is 6:00 am, do not arrive before that time as the [facility] entrance doors do not open until 6:00 am.  REMEMBER: Instructions that are not followed completely may result in serious medical risk, up to and including death; or upon the discretion of your surgeon and anesthesiologist your surgery may need to be rescheduled.  Do not eat food after midnight the night before surgery.  No gum chewing or hard candies.  You may however, drink water up to 2 hours before you are scheduled to arrive for your surgery. Do not drink anything within 2 hours of your scheduled arrival time.  In addition, your doctor has ordered for you to drink the provided:  Gatorade G2 Drinking this carbohydrate drink up to two hours before surgery helps to reduce insulin resistance and improve patient outcomes. Please complete drinking 2 hours before scheduled arrival time.  One week prior to surgery: starting today, September 13 Stop aspirin, meloxicam and Anti-inflammatories (NSAIDS) such as Advil, Aleve, Ibuprofen, Motrin, Naproxen, Naprosyn and Aspirin based products such as Excedrin, Goody's Powder, BC Powder. Stop ANY OVER THE COUNTER supplements until after surgery. Stop vitamin D You may however, continue to take Tylenol if needed for pain up until the day of surgery.  Continue taking all prescribed medications with the exception of the following:  empagliflozin (JARDIANCE) hold 3 days before surgery. Last day to take jardiance is Sunday, September 15. Resume AFTER surgery.  Ozempic hold for 7 days before surgery. Do NOT take Ozempic on Tuesday, September 17. Resume on your regular day, Tuesday, September 24.  TAKE ONLY THESE MEDICATIONS THE MORNING OF SURGERY  WITH A SIP OF WATER:  Albuterol nebulizer Carvedilol Esomeprazole (Nexium) - (take one the night before and one on the morning of surgery - helps to prevent nausea after surgery.) Flonase nasal spray Gabapentin Isosorbide mononitrate Myrbetriq Symbicort inhaler  Use inhalers on the day of surgery and bring your albuterol inhaler to the hospital.  No Alcohol for 24 hours before or after surgery.  No Smoking including e-cigarettes for 24 hours before surgery.  No chewable tobacco products for at least 6 hours before surgery.  No nicotine patches on the day of surgery.  Do not use any "recreational" drugs for at least a week (preferably 2 weeks) before your surgery.  Please be advised that the combination of cocaine and anesthesia may have negative outcomes, up to and including death. If you test positive for cocaine, your surgery will be cancelled.  On the morning of surgery brush your teeth with toothpaste and water, you may rinse your mouth with mouthwash if you wish. Do not swallow any toothpaste or mouthwash.  Use CHG Soap as directed on instruction sheet.  Do not wear jewelry, make-up, hairpins, clips or nail polish.  For welded (permanent) jewelry: bracelets, anklets, waist bands, etc.  Please have this removed prior to surgery.  If it is not removed, there is a chance that hospital personnel will need to cut it off on the day of surgery.  Do not wear lotions, powders, or perfumes.   Do not shave body hair from the neck down 48 hours before surgery.  Contact lenses, hearing aids and dentures may not be worn into surgery.  Do not bring  valuables to the hospital. [facility] is not responsible for any missing/lost belongings or valuables.   Total Shoulder Arthroplasty:  use Benzoyl Peroxide 5% Gel as directed on instruction sheet.  Bring your C-PAP to the hospital in case you may have to spend the night.   Notify your doctor if there is any change in your medical  condition (cold, fever, infection).  Wear comfortable clothing (specific to your surgery type) to the hospital.  After surgery, you can help prevent lung complications by doing breathing exercises.  Take deep breaths and cough every 1-2 hours. Your doctor may order a device called an Incentive Spirometer to help you take deep breaths.  If you are being admitted to the hospital overnight, leave your suitcase in the car. After surgery it may be brought to your room.  In case of increased patient census, it may be necessary for you, the patient, to continue your postoperative care in the Same Day Surgery department.  If you are being discharged the day of surgery, you will not be allowed to drive home. You will need a responsible individual to drive you home and stay with you for 24 hours after surgery.   If you are taking public transportation, you will need to have a responsible individual with you.  Please call the Pre-admissions Testing Dept. at [phone] if you have any questions about these instructions.  Surgery Visitation Policy:  Patients having surgery or a procedure may have two visitors.  Children under the age of 56 must have an adult with them who is not the patient.  Inpatient Visitation:    Visiting hours are 7 a.m. to 8 p.m. Up to four visitors are allowed at one time in a patient room. The visitors may rotate out with other people during the day.  One visitor age 91 or older may stay with the patient overnight and must be in the room by 8 p.m.    Pre-operative 5 CHG Bath Instructions   You can play a key role in reducing the risk of infection after surgery. Your skin needs to be as free of germs as possible. You can reduce the number of germs on your skin by washing with CHG (chlorhexidine gluconate) soap before surgery. CHG is an antiseptic soap that kills germs and continues to kill germs even after washing.   DO NOT use if you have an allergy to  chlorhexidine/CHG or antibacterial soaps. If your skin becomes reddened or irritated, stop using the CHG and notify one of our RNs at [phone].   Please shower with the CHG soap starting 4 days before surgery using the following schedule:     Please keep in mind the following:  DO NOT shave, including legs and underarms, starting the day of your first shower.   You may shave your face at any point before/day of surgery.  Place clean sheets on your bed the day you start using CHG soap. Use a clean washcloth (not used since being washed) for each shower. DO NOT sleep with pets once you start using the CHG.   CHG Shower Instructions:  If you choose to wash your hair and private area, wash first with your normal shampoo/soap.  After you use shampoo/soap, rinse your hair and body thoroughly to remove shampoo/soap residue.  Turn the water OFF and apply about 3 tablespoons (45 ml) of CHG soap to a CLEAN washcloth.  Apply CHG soap ONLY FROM YOUR NECK DOWN TO YOUR TOES (washing for 3-5  minutes)  DO NOT use CHG soap on face, private areas, open wounds, or sores.  Pay special attention to the area where your surgery is being performed.  If you are having back surgery, having someone wash your back for you may be helpful. Wait 2 minutes after CHG soap is applied, then you may rinse off the CHG soap.  Pat dry with a clean towel  Put on clean clothes/pajamas   If you choose to wear lotion, please use ONLY the CHG-compatible lotions on the back of this paper.     Additional instructions for the day of surgery: DO NOT APPLY any lotions, deodorants, cologne, or perfumes.   Put on clean/comfortable clothes.  Brush your teeth.  Ask your nurse before applying any prescription medications to the skin.      CHG Compatible Lotions   Aveeno Moisturizing lotion  Cetaphil Moisturizing Cream  Cetaphil Moisturizing Lotion  Clairol Herbal Essence Moisturizing Lotion, Dry Skin  Clairol Herbal Essence  Moisturizing Lotion, Extra Dry Skin  Clairol Herbal Essence Moisturizing Lotion, Normal Skin  Curel Age Defying Therapeutic Moisturizing Lotion with Alpha Hydroxy  Curel Extreme Care Body Lotion  Curel Soothing Hands Moisturizing Hand Lotion  Curel Therapeutic Moisturizing Cream, Fragrance-Free  Curel Therapeutic Moisturizing Lotion, Fragrance-Free  Curel Therapeutic Moisturizing Lotion, Original Formula  Eucerin Daily Replenishing Lotion  Eucerin Dry Skin Therapy Plus Alpha Hydroxy Crme  Eucerin Dry Skin Therapy Plus Alpha Hydroxy Lotion  Eucerin Original Crme  Eucerin Original Lotion  Eucerin Plus Crme Eucerin Plus Lotion  Eucerin TriLipid Replenishing Lotion  Keri Anti-Bacterial Hand Lotion  Keri Deep Conditioning Original Lotion Dry Skin Formula Softly Scented  Keri Deep Conditioning Original Lotion, Fragrance Free Sensitive Skin Formula  Keri Lotion Fast Absorbing Fragrance Free Sensitive Skin Formula  Keri Lotion Fast Absorbing Softly Scented Dry Skin Formula  Keri Original Lotion  Keri Skin Renewal Lotion Keri Silky Smooth Lotion  Keri Silky Smooth Sensitive Skin Lotion  Nivea Body Creamy Conditioning Oil  Nivea Body Extra Enriched Lotion  Nivea Body Original Lotion  Nivea Body Sheer Moisturizing Lotion Nivea Crme  Nivea Skin Firming Lotion  NutraDerm 30 Skin Lotion  NutraDerm Skin Lotion  NutraDerm Therapeutic Skin Cream  NutraDerm Therapeutic Skin Lotion  ProShield Protective Hand Cream  Provon moisturizing lotion    Preparing for Total Shoulder Arthroplasty  Before surgery, you can play an important role by reducing the number of germs on your skin by using the following products:  Benzoyl Peroxide Gel  o Reduces the number of germs present on the skin  o Applied twice a day to shoulder area starting two days before surgery  Chlorhexidine Gluconate (CHG) Soap  o An antiseptic cleaner that kills germs and bonds with the skin to continue killing germs even  after washing  o Used for showering the night before surgery and morning of surgery    BENZOYL PEROXIDE 5% GEL  Please do not use if you have an allergy to benzoyl peroxide. If your skin becomes reddened/irritated stop using the benzoyl peroxide.  Starting two days before surgery, apply as follows:  1. Apply benzoyl peroxide in the morning and at night. Apply after taking a shower. If you are not taking a shower, clean entire shoulder front, back, and side along with the armpit with a clean wet washcloth.  2. Place a quarter-sized dollop on your shoulder and rub in thoroughly, making sure to cover the front, back, and side of your shoulder, along with the armpit.  2  days before ____ AM ____ PM 1 day before ____ AM ____ PM  3. Do this twice a day for two days. (Last application is the night before surgery, AFTER using the CHG soap).  4. Do NOT apply benzoyl peroxide gel on the day of surgery.  Preoperative Educational Videos for Total Hip, Knee and Shoulder Replacements  To better prepare for surgery, please view our videos that explain the physical activity and discharge planning required to have the best surgical recovery at [facility].  [email or web address]  Questions? Call [phone] or email [email or web address]

## 2023-07-27 NOTE — Progress Notes (Unsigned)
Cardiology Office Note:  .   Date:  07/27/2023  ID:  Kristine Rangel, DOB [birth year], MRN [id number] PCP: Kristine Sato, MD  [facility] Providers Cardiologist:  Kristine Kendall, MD     History of Present Illness: .   Kristine Rangel is a 67 y.o. female with history of chronic HFpEF, hypertension, hyperlipidemia, type 2 diabetes mellitus, obstructive sleep apnea, cerebral aneurysm status post coiling, TIA, and COPD, who presents for follow-up of HFpEF.  I last saw her in June, at which time she was feeling fairly well.  Valsartan had been discontinued at her prior visit with Dr. Mayford Knife due to generalized malaise and soft blood pressure.  Leg edema had improved with escalation of furosemide.  I recommended taking furosemide 40 mg twice daily, with an extra dose in the morning for weight gain/worsening edema.  Today, [name] is most concerning about progressive right shoulder surgery for which she is undergoing surgery with Dr. Joice Lofts next week.  From a heart standpoint, she has been doing well without chest pain, shortness of breath, palpitations, lightheadedness, or edema.  Her weight has been fairly stable.  She is able to walk a block or two with her rolling walking with symptoms.  She is not checking her blood pressure at home.  She is already holding aspirin in anticipation of her shoulder surgery.  ROS: See HPI  Studies Reviewed: Marland Kitchen        TTE (05/17/2022): Normal LV size with mild LVH.  LVEF 60-65% with grade 1 diastolic dysfunction.  Normal RV size and function.  Normal biatrial size.  No pericardial effusion.  No significant valvular abnormalities.   Coronary CTA (02/24/2021): Normal study without evidence of coronary artery stenosis or calcification.  No significant extracardiac findings in the visualized chest.  Risk Assessment/Calculations:             Physical Exam:   VS:  BP 126/78 (BP Location: Left Arm, Patient Position: Sitting, Cuff Size: Large)   Pulse 76   Ht 5'  3" (1.6 m)   Wt 265 lb (120.2 kg)   SpO2 97%   BMI 46.94 kg/m    Wt Readings from Last 3 Encounters:  07/27/23 265 lb (120.2 kg)  05/10/23 272 lb (123.4 kg)  05/07/23 270 lb 9.6 oz (122.7 kg)    General:  NAD. Neck: No JVD or HJR. Lungs: Clear to auscultation bilaterally without wheezes or crackles. Heart: Regular rate and rhythm with 2/6 systolic murmur. Abdomen: Soft, nontender, nondistended. Extremities: Trace pretibial edema bilaterally.  ASSESSMENT AND PLAN: .    Preop evaluation: Though [name]'s functional capacity is limited, she can perform 4 METS of activity without chest pain or dyspnea.  She can proceed with her shoulder surgery as planned, which is a low-risk procedure, without further cardiac testing or intervention.  Aspirin can be held at the discretion of Dr. Joice Lofts and restarted when it is safe to do so.  Chronic HfpEF and mitral regurgitation: [name] appears grossly euvolemic, though evaluation islimited by her body habitus.  She endorses NYHA class II symptoms.  Continue current medications.  Hyperlipidemia associated with type 2 diabetes mellitus: We will check a lipid panel today to assess response to increased dose of atorvastatin.  Morbid obesity: BMI > 40.  Weight loss encouraged through diet and exercise.       Dispo: Return to clinic in 1 year.  Signed, Kristine Kendall, MD

## 2023-07-27 NOTE — Patient Instructions (Signed)
Medication Instructions:  Your physician recommends that you continue on your current medications as directed. Please refer to the Current Medication list given to you today.   *If you need a refill on your cardiac medications before your next appointment, please call your pharmacy*   Lab Work: Your provider would like for you to have following labs drawn today (Lipid).     Testing/Procedures: No test ordered today    Follow-Up: At [facility], you and your health needs are our priority.  As part of our continuing mission to provide you with exceptional heart care, we have created designated Provider Care Teams.  These Care Teams include your primary Cardiologist (physician) and Advanced Practice Providers (APPs -  Physician Assistants and Nurse Practitioners) who all work together to provide you with the care you need, when you need it.  We recommend signing up for the patient portal called "MyChart".  Sign up information is provided on this After Visit Summary.  MyChart is used to connect with patients for Virtual Visits (Telemedicine).  Patients are able to view lab/test results, encounter notes, upcoming appointments, etc.  Non-urgent messages can be sent to your provider as well.   To learn more about what you can do with MyChart, go to [email or web address].    Your next appointment:   1 year(s)  Provider:   You may see Yvonne Kendall, MD or one of the following Advanced Practice Providers on your designated Care Team:   Nicolasa Ducking, NP Eula Listen, PA-C Cadence Fransico Michael, PA-C Charlsie Quest, NP

## 2023-07-28 ENCOUNTER — Encounter: Payer: Self-pay | Admitting: Internal Medicine

## 2023-07-30 ENCOUNTER — Ambulatory Visit: Payer: 59

## 2023-07-30 ENCOUNTER — Telehealth: Payer: Self-pay | Admitting: Internal Medicine

## 2023-07-30 ENCOUNTER — Encounter: Payer: Self-pay | Admitting: Pharmacist

## 2023-07-30 NOTE — Telephone Encounter (Signed)
Patient read the following comments via MyChart: "Please let Kristine Rangel know that her cholesterol is stable with LDL still slightly above goal despite escalation of atorvastatin.  I recommend that she continue her current medications and try to work on diet and exercise to help improve her cholesterol."

## 2023-07-30 NOTE — Telephone Encounter (Signed)
Patient is returning RN's call for lab results. She states she reviewed the results via mychart so a callback is not necessary unless further information needs to be given.

## 2023-07-31 ENCOUNTER — Encounter: Payer: Self-pay | Admitting: Surgery

## 2023-08-01 ENCOUNTER — Encounter: Payer: Self-pay | Admitting: Surgery

## 2023-08-01 NOTE — Progress Notes (Signed)
Perioperative / Anesthesia Services  Pre-Admission Testing Clinical Review / Pre-Operative Anesthesia Consult  Date: 08/01/23  Patient Demographics:  Name: Kristine Rangel DOB:   [birth year] MRN:   [id number]  Planned Surgical Procedure(s):    Case: [id number] Date/Time: 08/02/23 1011   Procedure: REVERSE SHOULDER ARTHROPLASTY (Right: Shoulder)   Anesthesia type: Choice   Pre-op diagnosis:      Chronic right shoulder pain M25.511, G89.29     Rotator cuff arthropathy, right M12.811     Status post right rotator cuff repair Z98.890     Rotator cuff tendinitis, right M75.81   Location: [facility] OR ROOM 02 / [facility]   Surgeons: Christena Flake, MD     NOTE: Available PAT nursing documentation and vital signs have been reviewed. Clinical nursing staff has updated patient's PMH/PSHx, current medication list, and drug allergies/intolerances to ensure comprehensive history available to assist in medical decision making as it pertains to the aforementioned surgical procedure and anticipated anesthetic course. Extensive review of available clinical information personally performed. [facility] and PSHx updated with any diagnoses/procedures that  may have been inadvertently omitted during her intake with the pre-admission testing department's nursing staff.  Clinical Discussion:  Kristine Rangel is a 67 y.o. female who is submitted for pre-surgical anesthesia review and clearance prior to her undergoing the above procedure. Patient is a Former Games developer. Pertinent PMH includes: MI, HFpEF, basilar artery aneurysm (s/p coiling), TIA, angina, cardiac murmur, HTN, HLD, T2DM, CKD-III, COPD, asthma, OSAH (requires nocturnal PAP therapy), GERD (on daily PPI), nephrolithiasis, glaucoma, OA, rotator cuff arthropathy, anxiety, depression, insomnia.  Patient is followed by cardiology (End, MD). She was last seen in the cardiology clinic on 07/27/2023; notes reviewed. At the time of her clinic  visit, patient doing well overall from a cardiovascular perspective.  Patient's main complaint in clinic was progressive pain in her RIGHT shoulder.  Patient denied any chest pain, shortness of breath, PND, orthopnea, palpitations, significant peripheral edema, weakness, fatigue, vertiginous symptoms, or presyncope/syncope. Patient with a past medical history significant for cardiovascular diagnoses. Documented physical exam was grossly benign, providing no evidence of acute exacerbation and/or decompensation of the patient's known cardiovascular conditions.  Patient with a history of an intracranial artery aneurysm.  She underwent coil embolization of the mid basilar artery on 04/05/2009.  Post coiling, there was a filling defect noted just distal to the coils consistent with intraluminal thrombus.  Patient was treated with Reopro bolus followed by a 12-hour drip.  Subsequent imaging revealed resolution and no evidence of other concerning findings.   Patient underwent diagnostic LEFT heart catheterization on 09/11/2011.  Study revealed minor luminal irregularities within the mid LAD, mid LCx, and mid RCA.  There was no significant coronary artery disease noted.  Recommendations were for medical management.  Myocardial perfusion imaging study was performed on 06/02/2015 revealing a normal left trickle systolic function with an EF of 54%.  There was no evidence of stress-induced myocardial ischemia or arrhythmia; no scintigraphic evidence of scar.  Study determined to be normal and low risk. Coronary CTA was performed on 02/24/2021 revealing a normal coronary calcium score (0).  Study demonstrated normal coronary origin with RIGHT-sided dominance.  There were no significant extracardiac findings.  Most recent TTE was performed on 05/17/2022 revealing normal left ventricular systolic function with an EF of 60-65%.  There were no regional wall motion abnormalities. Left ventricular diastolic Doppler parameters  consistent with abnormal relaxation (G1DD).  Right ventricular size and function  normal.  There was no valvular regurgitation. All transvalvular gradients were noted to be normal providing no evidence suggestive of valvular stenosis. Aorta normal in size with no evidence of aneurysmal dilatation.  Blood pressure well controlled at 126/78 mmHg on currently prescribed beta-blocker (carvedilol, diuretic (furosemide), and nitrate (isosorbide mononitrate) therapies. Patient is on atorvastatin for her HLD diagnosis and ASCVD prevention.  T2DM being controlled with diet and lifestyle modification alone; no recent A1c for review (unable to add to preoperative labs).  In the setting of known cardiovascular disease and concurrent T2DM, patient is on an SGLT2i (empagliflozin) for added cardiovascular and renovascular protection.  Functional capacity somewhat limited by patient's age, obesity, and multiple medical comorbidities.  She is able to ambulate with a rolling walker.  Patient able to complete all of her ADLs/ADLs without significant cardiovascular limitation.  Per the DASI, patient is able to achieve at least 4 METS of physical activity without experiencing any significant degree of angina/anginal equivalent symptoms.  No changes were made to her medication regimen.  Patient to follow-up with outpatient cardiology in 1 year or sooner if needed.  FAYTH SOTH is scheduled for an elective REVERSE SHOULDER ARTHROPLASTY (Right: Shoulder) on 08/02/2023 with Dr. Leron Croak, MD.  Given patient's past medical history significant for cardiovascular diagnoses, presurgical cardiac clearance was sought by the PAT team.  Per cardiology, "though Ms. Loy's functional capacity is limited, she can perform 4 METS of activity without chest pain or dyspnea. She can proceed with her shoulder surgery as planned, which is a low-risk procedure, without further cardiac testing or intervention".  In review of her medication  reconciliation, it is noted that patient is currently on prescribed daily antithrombotic therapy. She has been instructed on recommendations for holding her daily low-dose ASA for 5 days prior to her procedure with plans to restart as soon as postoperative bleeding risk felt to be minimized by her attending surgeon. The patient has been instructed that her last dose of her ASA should be on 07/26/2023.  Patient denies previous perioperative complications with anesthesia in the past. In review of the available records, it is noted that patient underwent a general anesthetic course here at [facility] (ASA III) in 07/2021 without documented complications.      07/27/2023    1:17 PM 07/27/2023    9:10 AM 07/27/2023    8:47 AM  Vitals with BMI  Height 5\' 3"   5\' 3"   Weight 280 lbs  265 lbs  BMI 49.61  46.95  Systolic 114 126 161  Diastolic 81 78 79  Pulse 67  76    Providers/Specialists:   NOTE: Primary physician provider listed below. Patient may have been seen by APP or partner within same practice.   PROVIDER ROLE / SPECIALTY LAST OV  Poggi, Excell Seltzer, MD Orthopedics (Surgeon) 07/06/2023  Leanna Sato, MD Primary Care Provider ???  End, Cristal Deer, MD Cardiology 07/27/2023  Cristopher Peru, MD Neurology 03/12/2023   Allergies:  Bee venom and Penicillins  Current Home Medications:   No current facility-administered medications for this encounter.    albuterol (PROVENTIL) (2.5 MG/3ML) 0.083% nebulizer solution   albuterol (VENTOLIN HFA) 108 (90 Base) MCG/ACT inhaler   aspirin EC 81 MG tablet   atorvastatin (LIPITOR) 80 MG tablet   brinzolamide (AZOPT) 1 % ophthalmic suspension   carvedilol (COREG) 6.25 MG tablet   cetirizine (ZYRTEC) 10 MG tablet   cholecalciferol (VITAMIN D3) 25 MCG (1000 UNIT) tablet   DULoxetine (  CYMBALTA) 60 MG capsule   empagliflozin (JARDIANCE) 25 MG TABS tablet   EPINEPHrine 0.3 mg/0.3 mL IJ SOAJ injection   esomeprazole  (NEXIUM) 40 MG capsule   fluticasone (FLONASE) 50 MCG/ACT nasal spray   furosemide (LASIX) 40 MG tablet   gabapentin (NEURONTIN) 300 MG capsule   HYDROcodone-acetaminophen (NORCO/VICODIN) 5-325 MG tablet   isosorbide mononitrate (IMDUR) 60 MG 24 hr tablet   latanoprost (XALATAN) 0.005 % ophthalmic solution   lidocaine (LIDODERM) 5 %   mirabegron ER (MYRBETRIQ) 50 MG TB24 tablet   montelukast (SINGULAIR) 10 MG tablet   Semaglutide,0.25 or 0.5MG /DOS, (OZEMPIC, 0.25 OR 0.5 MG/DOSE,) 2 MG/3ML SOPN   Suvorexant (BELSOMRA) 15 MG TABS   SYMBICORT 160-4.5 MCG/ACT inhaler   tizanidine (ZANAFLEX) 2 MG capsule   topiramate (TOPAMAX) 50 MG tablet   History:   Past Medical History:  Diagnosis Date   (HFpEF) heart failure with preserved ejection fraction (HCC)    a.) TTE 03/03/2021: EF 60-65%, no RWMAs, mild LA dil, RVSP 32.6, G2DD; b.) TTE 05/17/2022: EF 60-65%, no RWMAs, normal RVSF, G1DD   Anxiety    Arthritis    Asthma    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   Bladder leak    Cardiac murmur    Chest pain    a.) 10/07/2008 Neg Dobuatmine Echo; b.) LHC 09/11/2011 - minor lum irregs; no sig CAD; c.) MV 06/02/2015: no ischemia; d.) cCTA 02/2021: Ca2+ = 0. Normal coronaries   CKD (chronic kidney disease), stage III (HCC)    COPD (chronic obstructive pulmonary disease) (HCC)    Depression    Diet-controlled type 2 diabetes mellitus (HCC)    Dyspnea    GERD (gastroesophageal reflux disease)    Glaucoma    History of kidney stones    History of left heart catheterization (LHC) 09/11/2011   a.) LHC 09/11/2011: minor luminal irregulaties mLAD, mLCx, mRCA - med mgmt.   Hyperlipidemia    Hypertension    Insomnia    a.) takes suvorexant   Long term current use of aspirin    MI  (myocardial infarction) (HCC)    a.) in the 1990s; details unclear   Migraine with aura    Neuropathy    OSA on CPAP    Rotator cuff injury    Seasonal allergies    Tendinitis of right rotator cuff    TIA (transient ischemic attack)    Past Surgical History:  Procedure Laterality Date   BRAIN SURGERY Right 2010   anuerysm repair   CARPAL TUNNEL RELEASE Right 06/12/2017   Procedure: CARPAL TUNNEL RELEASE;  Surgeon: Juanell Fairly, MD;  Location: [facility] ORS;  Service: Orthopedics;  Laterality: Right;   CHOLECYSTECTOMY     COLONOSCOPY WITH PROPOFOL N/A 08/09/2021   Procedure: COLONOSCOPY WITH PROPOFOL;  Surgeon: Regis Bill, MD;  Location: [facility] ENDOSCOPY;  Service: Endoscopy;  Laterality: N/A;   ESOPHAGOGASTRODUODENOSCOPY (EGD) WITH PROPOFOL N/A 08/09/2021   Procedure: ESOPHAGOGASTRODUODENOSCOPY (EGD) WITH PROPOFOL;  Surgeon: Regis Bill, MD;  Location: [facility] ENDOSCOPY;  Service: Endoscopy;  Laterality: N/A;   LEFT HEART CATH AND CORONARY ANGIOGRAPHY Left 09/11/2011   Procedure: LEFT HEART CATH AND CORONARY ANGIOGRAPHY; Location: [facility]; Surgeon: Adrian Blackwater, MD   SHOULDER ARTHROSCOPY WITH OPEN ROTATOR CUFF REPAIR Left 11/23/2016   Procedure: SHOULDER ARTHROSCOPY WITH OPEN ROTATOR  CUFF REPAIR, ARTHROSCOPIC SUBACROMIAL DECOMPRESSION, DISTAL CLAVICLE EXCISION;  Surgeon: Juanell Fairly, MD;  Location: [facility] ORS;  Service: Orthopedics;  Laterality: Left;   SHOULDER ARTHROSCOPY WITH OPEN ROTATOR CUFF REPAIR Right 07/30/2018   Procedure: SHOULDER ARTHROSCOPY WITH OPEN ROTATOR CUFF REPAIR,SUBACROMINAL DECOMPRESSION AND DISTAL CLAVICLE EXCISION;  Surgeon: Juanell Fairly, MD;  Location: [facility] ORS;  Service: Orthopedics;  Laterality: Right;   TONSILLECTOMY     TOTAL KNEE ARTHROPLASTY Bilateral    TOTAL KNEE REVISION Right 05/05/2020   Procedure: right total knee arthroplasty revision;  Surgeon: Lyndle Herrlich, MD;  Location: [facility] ORS;  Service: Orthopedics;  Laterality: Right;    Family History  Problem Relation Age of Onset   Breast cancer Maternal Aunt    Diabetes Mother    Heart attack Paternal Grandfather    Stroke Paternal Grandfather    Social History   Tobacco Use   Smoking status: Former    Types: Cigarettes   Smokeless tobacco: Current    Types: Snuff  Vaping Use   Vaping status: Never Used  Substance Use Topics   Alcohol use: Yes    Comment: rarely wine   Drug use: No    Pertinent Clinical Results:  LABS:   No visits with results within 3 Day(s) from this visit.  Latest known visit with results is:  Hospital Outpatient Visit on 07/27/2023  Component Date Value Ref Range Status   Cholesterol 07/27/2023 168  0 - 200 mg/dL Final   Triglycerides 32/44/0102 116  <150 mg/dL Final   HDL 72/53/6644 62  >40 mg/dL Final   Total CHOL/HDL Ratio 07/27/2023 2.7  RATIO Final   VLDL 07/27/2023 23  0 - 40 mg/dL Final   LDL Cholesterol 07/27/2023 83  0 - 99 mg/dL Final   Comment:        Total Cholesterol/HDL:CHD Risk Coronary Heart Disease Risk Table                     Men   Women  1/2 Average Risk   3.4   3.3  Average Risk       5.0   4.4  2 X Average Risk   9.6   7.1  3 X Average Risk  23.4   11.0        Use the calculated Patient Ratio above and the CHD Risk Table to determine the patient's CHD Risk.        ATP III CLASSIFICATION (LDL):  <100     mg/dL   Optimal  034-742  mg/dL   Near or Above                    Optimal  130-159  mg/dL   Borderline  595-638  mg/dL   High  >756     mg/dL   Very High Performed at [facility], [address] [place], [place] [address]    Sodium 07/27/2023 139  135 - 145 mmol/L Final   Potassium 07/27/2023 4.4  3.5 - 5.1 mmol/L Final   Chloride 07/27/2023 102  98 - 111 mmol/L Final   CO2 07/27/2023 27  22 - 32 mmol/L Final   Glucose, Bld 07/27/2023 90  70 - 99 mg/dL Final   Glucose reference range applies only to samples taken after fasting for at least 8 hours.   BUN 07/27/2023 19  8 -  23 mg/dL Final   Creatinine, Ser 07/27/2023 1.09 (H)  0.44 - 1.00 mg/dL Final  Calcium 07/27/2023 10.0  8.9 - 10.3 mg/dL Final   Total Protein 19/14/7829 6.8  6.5 - 8.1 g/dL Final   Albumin 56/21/3086 3.8  3.5 - 5.0 g/dL Final   AST 57/84/6962 19  15 - 41 U/L Final   ALT 07/27/2023 19  0 - 44 U/L Final   Alkaline Phosphatase 07/27/2023 86  38 - 126 U/L Final   Total Bilirubin 07/27/2023 0.8  0.3 - 1.2 mg/dL Final   GFR, Estimated 07/27/2023 55 (L)  >60 mL/min Final   Comment: (NOTE) Calculated using the CKD-EPI Creatinine Equation (2021)    Anion gap 07/27/2023 10  5 - 15 Final   Performed at [facility], [address] [place], [place] [address]   WBC 07/27/2023 4.9  4.0 - 10.5 K/uL Final   RBC 07/27/2023 4.59  3.87 - 5.11 MIL/uL Final   Hemoglobin 07/27/2023 12.5  12.0 - 15.0 g/dL Final   HCT 13/24/4010 38.4  36.0 - 46.0 % Final   MCV 07/27/2023 83.7  80.0 - 100.0 fL Final   MCH 07/27/2023 27.2  26.0 - 34.0 pg Final   MCHC 07/27/2023 32.6  30.0 - 36.0 g/dL Final   RDW 27/25/3664 15.8 (H)  11.5 - 15.5 % Final   Platelets 07/27/2023 343  150 - 400 K/uL Final   nRBC 07/27/2023 0.0  0.0 - 0.2 % Final   Neutrophils Relative % 07/27/2023 44  % Final   Neutro Abs 07/27/2023 2.1  1.7 - 7.7 K/uL Final   Lymphocytes Relative 07/27/2023 38  % Final   Lymphs Abs 07/27/2023 1.8  0.7 - 4.0 K/uL Final   Monocytes Relative 07/27/2023 10  % Final   Monocytes Absolute 07/27/2023 0.5  0.1 - 1.0 K/uL Final   Eosinophils Relative 07/27/2023 7  % Final   Eosinophils Absolute 07/27/2023 0.4  0.0 - 0.5 K/uL Final   Basophils Relative 07/27/2023 1  % Final   Basophils Absolute 07/27/2023 0.0  0.0 - 0.1 K/uL Final   Immature Granulocytes 07/27/2023 0  % Final   Abs Immature Granulocytes 07/27/2023 0.01  0.00 - 0.07 K/uL Final   Performed at [facility], [address] [place], [place] [address]   ABO/RH(D) 07/27/2023 O POS   Final   Antibody Screen 07/27/2023 NEG   Final    Sample Expiration 07/27/2023 08/10/2023,2359   Final   Extend sample reason 07/27/2023    Final                   Value:NO TRANSFUSIONS OR PREGNANCY IN THE PAST 3 MONTHS Performed at [facility], [address] [place], [place] [address]    MRSA, PCR 07/27/2023 NEGATIVE  NEGATIVE Final   Staphylococcus aureus 07/27/2023 POSITIVE (A)  NEGATIVE Final   Comment: (NOTE) The Xpert SA Assay (FDA approved for NASAL specimens in patients 41 years of age and older), is one component of a comprehensive surveillance program. It is not intended to diagnose infection nor to guide or monitor treatment. Performed at [facility], [address] [place], [place] [address]    Color, Urine 07/27/2023 YELLOW (A)  YELLOW Final   APPearance 07/27/2023 CLEAR (A)  CLEAR Final   Specific Gravity, Urine 07/27/2023 1.018  1.005 - 1.030 Final   pH 07/27/2023 5.0  5.0 - 8.0 Final   Glucose, UA 07/27/2023 >=500 (A)  NEGATIVE mg/dL Final   Hgb urine dipstick 07/27/2023 NEGATIVE  NEGATIVE Final   Bilirubin Urine 07/27/2023 NEGATIVE  NEGATIVE Final   Ketones, ur 07/27/2023 NEGATIVE  NEGATIVE mg/dL Final   Protein, ur 44/11/270 NEGATIVE  NEGATIVE mg/dL Final   Nitrite 53/66/4403 NEGATIVE  NEGATIVE Final   Leukocytes,Ua 07/27/2023 NEGATIVE  NEGATIVE Final   RBC / HPF 07/27/2023 0-5  0 - 5 RBC/hpf Final   WBC, UA 07/27/2023 0-5  0 - 5 WBC/hpf Final   Bacteria, UA 07/27/2023 NONE SEEN  NONE SEEN Final   Squamous Epithelial / HPF 07/27/2023 0-5  0 - 5 /HPF Final   Mucus 07/27/2023 PRESENT   Final   Performed at [facility], [address] [place], [place] [address]    ECG: Date: 07/27/2023 Time ECG obtained: 0853 AM Rate: 76 bpm Rhythm: normal sinus Axis (leads I and aVF): Normal Intervals: PR 146 ms. QRS 62 ms. QTc 416 ms. ST segment and T wave changes: No evidence of acute ST segment elevation or depression.   Comparison: Similar to previous tracing obtained on  02/02/2023   IMAGING / PROCEDURES: DIAGNOSTIC RADIOGRAPHS OF RIGHT COMPLETE SHOULDER performed on 07/06/2023 Moderate degenerative changes as manifest by superior glenohumeral joint space narrowing.   The subacromial space is markedly decreased.   There is no subacromial or infra-clavicular spurring.   She demonstrates a Type I acromion.   US CAROTID BILATERAL performed on 06/15/2022 Color duplex indicates minimal heterogeneous plaque, with no hemodynamically significant stenosis by duplex criteria in the extracranial cerebrovascular circulation.  TRANSTHORACIC ECHOCARDIOGRAM performed on 05/17/2022 Left ventricular ejection fraction, by estimation, is 60 to 65%. The left ventricle has normal function. The left ventricle has no regional wall motion abnormalities. Left ventricular diastolic parameters are consistent with Grade I diastolic dysfunction (impaired relaxation). Right ventricular systolic function is normal. The right ventricular size is normal.  The mitral valve is normal in structure. No evidence of mitral valve regurgitation.  The aortic valve was not well visualized. Aortic valve regurgitation is not visualized.   CT CORONARY MORPH W/CTA COR W/SCORE W/CA W/CM &/OR WO/CM performed on 02/24/2021 No significant extracardiac findings Normal coronary calcium score of 0. Patient is low risk for coronary events. Normal coronary origin with right dominance. No evidence of CAD. CAD-RADS 0. Consider non-atherosclerotic causes of chest pain.  Impression and Plan:  Kristine Rangel has been referred for pre-anesthesia review and clearance prior to her undergoing the planned anesthetic and procedural courses. Available labs, pertinent testing, and imaging results were personally reviewed by me in preparation for upcoming operative/procedural course. [facility] medical record has been updated following extensive record review and patient interview with PAT staff.   This patient has been  appropriately cleared by cardiology with an overall LOW risk of experiencing significant perioperative cardiovascular complications. Based on clinical review performed today (08/01/23), barring any significant acute changes in the patient's overall condition, it is anticipated that she will be able to proceed with the planned surgical intervention. Any acute changes in clinical condition may necessitate her procedure being postponed and/or cancelled. Patient will meet with anesthesia team (MD and/or CRNA) on the day of her procedure for preoperative evaluation/assessment. Questions regarding anesthetic course will be fielded at that time.   Pre-surgical instructions were reviewed with the patient during her PAT appointment, and questions were fielded to satisfaction by PAT clinical staff. She has been instructed on which medications that she will need to hold prior to surgery, as well as the ones that have been deemed safe/appropriate to take on the day of her procedure. As part of the general education  provided by PAT, patient made aware both verbally and in writing, that she would need to abstain from the use of any illegal substances during her perioperative course.  She was advised that failure to follow the provided instructions could necessitate case cancellation or result in serious perioperative complications up to and including death. Patient encouraged to contact PAT and/or her surgeon's office to discuss any questions or concerns that may arise prior to surgery; verbalized understanding.   Quentin Mulling, MSN, APRN, FNP-C, CEN [facility]  Perioperative Services Nurse Practitioner Phone: [phone] Fax: [phone] 08/01/23 1:39 PM  NOTE: This note has been prepared using Dragon dictation software. Despite my best ability to proofread, there is always the potential that unintentional transcriptional errors may still occur from this process.

## 2023-08-02 ENCOUNTER — Encounter: Payer: Self-pay | Admitting: Surgery

## 2023-08-02 ENCOUNTER — Ambulatory Visit: Payer: 59 | Admitting: Urgent Care

## 2023-08-02 ENCOUNTER — Other Ambulatory Visit: Payer: Self-pay

## 2023-08-02 ENCOUNTER — Encounter
Admission: RE | Disposition: A | Discharge status: Home / Self Care | Payer: Self-pay | Source: Home / Self Care | Attending: Surgery

## 2023-08-02 ENCOUNTER — Ambulatory Visit
Admission: RE | Admit: 2023-08-02 | Discharge: 2023-08-02 | Disposition: A | Discharge status: Home / Self Care | Payer: 59 | Attending: Surgery | Admitting: Surgery

## 2023-08-02 ENCOUNTER — Ambulatory Visit: Payer: 59

## 2023-08-02 DIAGNOSIS — Z87891 Personal history of nicotine dependence: Secondary | ICD-10-CM | POA: Insufficient documentation

## 2023-08-02 DIAGNOSIS — I5032 Chronic diastolic (congestive) heart failure: Secondary | ICD-10-CM | POA: Diagnosis not present

## 2023-08-02 DIAGNOSIS — M7581 Other shoulder lesions, right shoulder: Secondary | ICD-10-CM | POA: Diagnosis not present

## 2023-08-02 DIAGNOSIS — M75101 Unspecified rotator cuff tear or rupture of right shoulder, not specified as traumatic: Secondary | ICD-10-CM | POA: Insufficient documentation

## 2023-08-02 DIAGNOSIS — Z7984 Long term (current) use of oral hypoglycemic drugs: Secondary | ICD-10-CM | POA: Diagnosis not present

## 2023-08-02 DIAGNOSIS — K219 Gastro-esophageal reflux disease without esophagitis: Secondary | ICD-10-CM | POA: Diagnosis not present

## 2023-08-02 DIAGNOSIS — Z79890 Hormone replacement therapy (postmenopausal): Secondary | ICD-10-CM | POA: Diagnosis not present

## 2023-08-02 DIAGNOSIS — Z96653 Presence of artificial knee joint, bilateral: Secondary | ICD-10-CM | POA: Insufficient documentation

## 2023-08-02 DIAGNOSIS — Z602 Problems related to living alone: Secondary | ICD-10-CM | POA: Diagnosis not present

## 2023-08-02 DIAGNOSIS — E1122 Type 2 diabetes mellitus with diabetic chronic kidney disease: Secondary | ICD-10-CM | POA: Diagnosis not present

## 2023-08-02 DIAGNOSIS — I130 Hypertensive heart and chronic kidney disease with heart failure and stage 1 through stage 4 chronic kidney disease, or unspecified chronic kidney disease: Secondary | ICD-10-CM | POA: Diagnosis not present

## 2023-08-02 DIAGNOSIS — I2510 Atherosclerotic heart disease of native coronary artery without angina pectoris: Secondary | ICD-10-CM | POA: Insufficient documentation

## 2023-08-02 DIAGNOSIS — N183 Chronic kidney disease, stage 3 unspecified: Secondary | ICD-10-CM | POA: Diagnosis not present

## 2023-08-02 DIAGNOSIS — Z7985 Long-term (current) use of injectable non-insulin antidiabetic drugs: Secondary | ICD-10-CM | POA: Diagnosis not present

## 2023-08-02 DIAGNOSIS — Z8673 Personal history of transient ischemic attack (TIA), and cerebral infarction without residual deficits: Secondary | ICD-10-CM | POA: Diagnosis not present

## 2023-08-02 DIAGNOSIS — F418 Other specified anxiety disorders: Secondary | ICD-10-CM | POA: Diagnosis not present

## 2023-08-02 DIAGNOSIS — I252 Old myocardial infarction: Secondary | ICD-10-CM | POA: Diagnosis not present

## 2023-08-02 DIAGNOSIS — J449 Chronic obstructive pulmonary disease, unspecified: Secondary | ICD-10-CM | POA: Diagnosis not present

## 2023-08-02 DIAGNOSIS — Z79899 Other long term (current) drug therapy: Secondary | ICD-10-CM | POA: Diagnosis not present

## 2023-08-02 DIAGNOSIS — Z01812 Encounter for preprocedural laboratory examination: Secondary | ICD-10-CM

## 2023-08-02 DIAGNOSIS — Z6841 Body Mass Index (BMI) 40.0 and over, adult: Secondary | ICD-10-CM | POA: Insufficient documentation

## 2023-08-02 HISTORY — PX: REVERSE SHOULDER ARTHROPLASTY: SHX5054

## 2023-08-02 HISTORY — DX: Chronic kidney disease, stage 3 unspecified: N18.30

## 2023-08-02 HISTORY — DX: Cardiac murmur, unspecified: R01.1

## 2023-08-02 HISTORY — DX: Other shoulder lesions, right shoulder: M75.81

## 2023-08-02 HISTORY — DX: Obstructive sleep apnea (adult) (pediatric): G47.33

## 2023-08-02 HISTORY — DX: Type 2 diabetes mellitus without complications: E11.9

## 2023-08-02 HISTORY — DX: Long term (current) use of aspirin: Z79.82

## 2023-08-02 HISTORY — DX: Insomnia, unspecified: G47.00

## 2023-08-02 HISTORY — DX: Transient cerebral ischemic attack, unspecified: G45.9

## 2023-08-02 LAB — COMPREHENSIVE METABOLIC PANEL
ALT: 14 U/L (ref 0–44)
AST: 20 U/L (ref 15–41)
Albumin: 3.4 g/dL — ABNORMAL LOW (ref 3.5–5.0)
Alkaline Phosphatase: 75 U/L (ref 38–126)
Anion gap: 7 (ref 5–15)
BUN: 16 mg/dL (ref 8–23)
CO2: 25 mmol/L (ref 22–32)
Calcium: 9.7 mg/dL (ref 8.9–10.3)
Chloride: 106 mmol/L (ref 98–111)
Creatinine, Ser: 0.95 mg/dL (ref 0.44–1.00)
GFR, Estimated: >60 mL/min (ref >60)
Glucose, Bld: 91 mg/dL (ref 70–99)
Potassium: 3.8 mmol/L (ref 3.5–5.1)
Sodium: 138 mmol/L (ref 135–145)
Total Bilirubin: 0.7 mg/dL (ref 0.3–1.2)
Total Protein: 6.5 g/dL (ref 6.5–8.1)

## 2023-08-02 LAB — CBC WITH DIFFERENTIAL/PLATELET
Abs Immature Granulocytes: 0.01 10*3/uL (ref 0.00–0.07)
Basophils Absolute: 0.1 10*3/uL (ref 0.0–0.1)
Basophils Relative: 1 %
Eosinophils Absolute: 0.3 10*3/uL (ref 0.0–0.5)
Eosinophils Relative: 5 %
HCT: 35 % — ABNORMAL LOW (ref 36.0–46.0)
Hemoglobin: 11.4 g/dL — ABNORMAL LOW (ref 12.0–15.0)
Immature Granulocytes: 0 %
Lymphocytes Relative: 39 %
Lymphs Abs: 2 10*3/uL (ref 0.7–4.0)
MCH: 27.1 pg (ref 26.0–34.0)
MCHC: 32.6 g/dL (ref 30.0–36.0)
MCV: 83.3 fL (ref 80.0–100.0)
Monocytes Absolute: 0.7 10*3/uL (ref 0.1–1.0)
Monocytes Relative: 13 %
Neutro Abs: 2.1 10*3/uL (ref 1.7–7.7)
Neutrophils Relative %: 42 %
Platelets: 298 10*3/uL (ref 150–400)
RBC: 4.2 MIL/uL (ref 3.87–5.11)
RDW: 15.6 % — ABNORMAL HIGH (ref 11.5–15.5)
WBC: 5.1 10*3/uL (ref 4.0–10.5)
nRBC: 0 % (ref 0.0–0.2)

## 2023-08-02 LAB — GLUCOSE, CAPILLARY
Glucose-Capillary: 97 mg/dL (ref 70–99)
Glucose-Capillary: 90 mg/dL (ref 70–99)

## 2023-08-02 SURGERY — ARTHROPLASTY, SHOULDER, TOTAL, REVERSE
Anesthesia: General | Site: Shoulder | Laterality: Right

## 2023-08-02 MED ORDER — STERILE WATER FOR IRRIGATION IR SOLN
Status: DC | PRN
Start: 2023-08-02 — End: 2023-08-02
  Administered 2023-08-02: 1000 mL

## 2023-08-02 MED ORDER — CHLORHEXIDINE GLUCONATE 0.12 % MT SOLN
15.0000 mL | Freq: Once | OROMUCOSAL | Status: AC
  Administered 2023-08-02: 15 mL via OROMUCOSAL

## 2023-08-02 MED ORDER — SUGAMMADEX SODIUM 200 MG/2ML IV SOLN
INTRAVENOUS | Status: DC | PRN
  Administered 2023-08-02: 400 mg via INTRAVENOUS

## 2023-08-02 MED ORDER — FENTANYL CITRATE (PF) 100 MCG/2ML IJ SOLN
INTRAMUSCULAR | Status: DC | PRN
  Administered 2023-08-02: 50 ug via INTRAVENOUS

## 2023-08-02 MED ORDER — ONDANSETRON HCL 4 MG/2ML IJ SOLN: 4.0000 mg | Freq: Four times a day (QID) | INTRAMUSCULAR | Status: DC | PRN

## 2023-08-02 MED ORDER — ROCURONIUM BROMIDE 10 MG/ML (PF) SYRINGE
PREFILLED_SYRINGE | INTRAVENOUS | Status: AC
  Filled 2023-08-02: qty 10

## 2023-08-02 MED ORDER — ATORVASTATIN CALCIUM 80 MG PO TABS
80.0000 mg | ORAL_TABLET | Freq: Every day | ORAL | Status: DC
Start: 2023-08-02 — End: 2024-06-11

## 2023-08-02 MED ORDER — ROCURONIUM BROMIDE 100 MG/10ML IV SOLN
INTRAVENOUS | Status: DC | PRN
  Administered 2023-08-02: 20 mg via INTRAVENOUS
  Administered 2023-08-02: 50 mg via INTRAVENOUS

## 2023-08-02 MED ORDER — TRANEXAMIC ACID-NACL 1000-0.7 MG/100ML-% IV SOLN
1000.0000 mg | INTRAVENOUS | Status: AC
  Administered 2023-08-02: 1000 mg via INTRAVENOUS

## 2023-08-02 MED ORDER — CEFAZOLIN SODIUM-DEXTROSE 2-4 GM/100ML-% IV SOLN
2.0000 g | Freq: Four times a day (QID) | INTRAVENOUS | Status: DC
  Administered 2023-08-02: 2 g via INTRAVENOUS

## 2023-08-02 MED ORDER — 0.9 % SODIUM CHLORIDE (POUR BTL) OPTIME
TOPICAL | Status: DC | PRN
  Administered 2023-08-02: 500 mL

## 2023-08-02 MED ORDER — PROPOFOL 10 MG/ML IV BOLUS
INTRAVENOUS | Status: AC
  Filled 2023-08-02: qty 20

## 2023-08-02 MED ORDER — KETOROLAC TROMETHAMINE 15 MG/ML IJ SOLN
INTRAMUSCULAR | Status: AC
  Filled 2023-08-02: qty 1

## 2023-08-02 MED ORDER — ACETAMINOPHEN 325 MG PO TABS: 325.0000 mg | ORAL_TABLET | Freq: Four times a day (QID) | ORAL | Status: DC | PRN

## 2023-08-02 MED ORDER — TRANEXAMIC ACID-NACL 1000-0.7 MG/100ML-% IV SOLN
INTRAVENOUS | Status: AC
  Filled 2023-08-02: qty 100

## 2023-08-02 MED ORDER — BUPIVACAINE LIPOSOME 1.3 % IJ SUSP
INTRAMUSCULAR | Status: AC
  Filled 2023-08-02: qty 20

## 2023-08-02 MED ORDER — SUCCINYLCHOLINE CHLORIDE 200 MG/10ML IV SOSY
PREFILLED_SYRINGE | INTRAVENOUS | Status: AC
  Filled 2023-08-02: qty 10

## 2023-08-02 MED ORDER — BUPIVACAINE HCL (PF) 0.5 % IJ SOLN
INTRAMUSCULAR | Status: AC
  Filled 2023-08-02: qty 10

## 2023-08-02 MED ORDER — BUPIVACAINE HCL (PF) 0.5 % IJ SOLN
INTRAMUSCULAR | Status: AC
  Filled 2023-08-02: qty 30

## 2023-08-02 MED ORDER — DEXAMETHASONE SODIUM PHOSPHATE 10 MG/ML IJ SOLN
INTRAMUSCULAR | Status: DC | PRN
  Administered 2023-08-02: 5 mg via INTRAVENOUS

## 2023-08-02 MED ORDER — BUPIVACAINE-EPINEPHRINE (PF) 0.5% -1:200000 IJ SOLN
INTRAMUSCULAR | Status: AC
  Filled 2023-08-02: qty 30

## 2023-08-02 MED ORDER — PHENYLEPHRINE HCL-NACL 20-0.9 MG/250ML-% IV SOLN
INTRAVENOUS | Status: DC | PRN
Start: 2023-08-02 — End: 2023-08-02

## 2023-08-02 MED ORDER — METOCLOPRAMIDE HCL 5 MG/ML IJ SOLN: 5.0000 mg | Freq: Three times a day (TID) | INTRAMUSCULAR | Status: DC | PRN

## 2023-08-02 MED ORDER — OXYCODONE HCL 5 MG PO TABS: 5.0000 mg | ORAL_TABLET | ORAL | Status: DC | PRN

## 2023-08-02 MED ORDER — PHENYLEPHRINE HCL-NACL 20-0.9 MG/250ML-% IV SOLN
INTRAVENOUS | Status: AC
  Filled 2023-08-02: qty 250

## 2023-08-02 MED ORDER — MIDAZOLAM HCL 2 MG/2ML IJ SOLN
INTRAMUSCULAR | Status: AC
  Filled 2023-08-02: qty 2

## 2023-08-02 MED ORDER — PHENYLEPHRINE 80 MCG/ML (10ML) SYRINGE FOR IV PUSH (FOR BLOOD PRESSURE SUPPORT)
PREFILLED_SYRINGE | INTRAVENOUS | Status: AC
  Filled 2023-08-02: qty 10

## 2023-08-02 MED ORDER — OXYCODONE HCL 5 MG PO TABS: 5.0000 mg | ORAL_TABLET | Freq: Once | ORAL | Status: DC | PRN

## 2023-08-02 MED ORDER — PROPOFOL 10 MG/ML IV BOLUS
INTRAVENOUS | Status: DC | PRN
  Administered 2023-08-02: 150 mg via INTRAVENOUS

## 2023-08-02 MED ORDER — OXYCODONE HCL 5 MG/5ML PO SOLN: 5.0000 mg | Freq: Once | ORAL | Status: DC | PRN

## 2023-08-02 MED ORDER — SODIUM CHLORIDE 0.9 % IV SOLN: INTRAVENOUS | Status: DC

## 2023-08-02 MED ORDER — ORAL CARE MOUTH RINSE: 15.0000 mL | Freq: Once | OROMUCOSAL | Status: AC

## 2023-08-02 MED ORDER — BUPIVACAINE-EPINEPHRINE (PF) 0.5% -1:200000 IJ SOLN
INTRAMUSCULAR | Status: DC | PRN
  Administered 2023-08-02: 30 mL

## 2023-08-02 MED ORDER — CEFAZOLIN SODIUM-DEXTROSE 2-4 GM/100ML-% IV SOLN
INTRAVENOUS | Status: AC
  Filled 2023-08-02: qty 100

## 2023-08-02 MED ORDER — DROPERIDOL 2.5 MG/ML IJ SOLN: 0.6250 mg | Freq: Once | INTRAMUSCULAR | Status: DC | PRN

## 2023-08-02 MED ORDER — CEFAZOLIN SODIUM-DEXTROSE 2-4 GM/100ML-% IV SOLN
2.0000 g | INTRAVENOUS | Status: AC
  Administered 2023-08-02: 2 g via INTRAVENOUS
  Administered 2023-08-02: 1 g via INTRAVENOUS
  Administered 2023-08-02: 2 g via INTRAVENOUS

## 2023-08-02 MED ORDER — CHLORHEXIDINE GLUCONATE 0.12 % MT SOLN
OROMUCOSAL | Status: AC
  Filled 2023-08-02: qty 15

## 2023-08-02 MED ORDER — BUPIVACAINE HCL (PF) 0.5 % IJ SOLN
INTRAMUSCULAR | Status: DC | PRN
Start: 2023-08-02 — End: 2023-08-02
  Administered 2023-08-02: 10 mL via PERINEURAL

## 2023-08-02 MED ORDER — SODIUM CHLORIDE FLUSH 0.9 % IV SOLN
INTRAVENOUS | Status: AC
  Filled 2023-08-02: qty 20

## 2023-08-02 MED ORDER — METOCLOPRAMIDE HCL 10 MG PO TABS: 5.0000 mg | ORAL_TABLET | Freq: Three times a day (TID) | ORAL | Status: DC | PRN

## 2023-08-02 MED ORDER — ONDANSETRON HCL 4 MG/2ML IJ SOLN
INTRAMUSCULAR | Status: DC | PRN
  Administered 2023-08-02: 4 mg via INTRAVENOUS

## 2023-08-02 MED ORDER — PHENYLEPHRINE HCL-NACL 20-0.9 MG/250ML-% IV SOLN
INTRAVENOUS | Status: DC | PRN
Start: 2023-08-02 — End: 2023-08-02
  Administered 2023-08-02: 20 ug/min via INTRAVENOUS

## 2023-08-02 MED ORDER — KETOROLAC TROMETHAMINE 15 MG/ML IJ SOLN
15.0000 mg | Freq: Once | INTRAMUSCULAR | Status: AC
  Administered 2023-08-02: 15 mg via INTRAVENOUS

## 2023-08-02 MED ORDER — LIDOCAINE HCL (PF) 1 % IJ SOLN
INTRAMUSCULAR | Status: AC
  Filled 2023-08-02: qty 2

## 2023-08-02 MED ORDER — ACETAMINOPHEN 10 MG/ML IV SOLN: 1000.0000 mg | Freq: Once | INTRAVENOUS | Status: DC | PRN

## 2023-08-02 MED ORDER — MIDAZOLAM HCL 2 MG/2ML IJ SOLN
INTRAMUSCULAR | Status: DC | PRN
Start: 2023-08-02 — End: 2023-08-02
  Administered 2023-08-02: 2 mg via INTRAVENOUS

## 2023-08-02 MED ORDER — EPINEPHRINE PF 1 MG/ML IJ SOLN
INTRAMUSCULAR | Status: AC
  Filled 2023-08-02: qty 1

## 2023-08-02 MED ORDER — FENTANYL CITRATE (PF) 100 MCG/2ML IJ SOLN
INTRAMUSCULAR | Status: AC
  Filled 2023-08-02: qty 2

## 2023-08-02 MED ORDER — DEXAMETHASONE SODIUM PHOSPHATE 10 MG/ML IJ SOLN
INTRAMUSCULAR | Status: AC
  Filled 2023-08-02: qty 1

## 2023-08-02 MED ORDER — BUPIVACAINE LIPOSOME 1.3 % IJ SUSP
INTRAMUSCULAR | Status: DC | PRN
Start: 2023-08-02 — End: 2023-08-02
  Administered 2023-08-02: 20 mL via PERINEURAL

## 2023-08-02 MED ORDER — OXYCODONE HCL 5 MG PO TABS
5.0000 mg | ORAL_TABLET | ORAL | 0 refills | Status: DC | PRN
Start: 2023-08-02 — End: 2024-02-19

## 2023-08-02 MED ORDER — LIDOCAINE HCL (PF) 1 % IJ SOLN
INTRAMUSCULAR | Status: DC | PRN
Start: 2023-08-02 — End: 2023-08-02
  Administered 2023-08-02: 1 mL via SUBCUTANEOUS

## 2023-08-02 MED ORDER — PROMETHAZINE HCL 25 MG/ML IJ SOLN: 6.2500 mg | INTRAMUSCULAR | Status: DC | PRN

## 2023-08-02 MED ORDER — SODIUM CHLORIDE 0.9 % IR SOLN
Status: DC | PRN
  Administered 2023-08-02: 3000 mL

## 2023-08-02 MED ORDER — ONDANSETRON HCL 4 MG/2ML IJ SOLN
INTRAMUSCULAR | Status: AC
  Filled 2023-08-02: qty 2

## 2023-08-02 MED ORDER — FENTANYL CITRATE (PF) 100 MCG/2ML IJ SOLN: 25.0000 ug | INTRAMUSCULAR | Status: DC | PRN

## 2023-08-02 MED ORDER — CEFAZOLIN SODIUM 1 G IJ SOLR
INTRAMUSCULAR | Status: AC
  Filled 2023-08-02: qty 10

## 2023-08-02 MED ORDER — ONDANSETRON HCL 4 MG PO TABS: 4.0000 mg | ORAL_TABLET | Freq: Four times a day (QID) | ORAL | Status: DC | PRN

## 2023-08-02 SURGICAL SUPPLY — 74 items
3.0 MM DRILL IMPLANT
APL PRP STRL LF DISP 70% ISPRP (MISCELLANEOUS) ×1
BIT DRILL 3 FOR 4.5 SCREW (BIT) ×1
BIT DRILL 3MM FOR 4.5 SCREW (BIT) IMPLANT
BLADE SAW SAG 25X90X1.19 (BLADE) ×1 IMPLANT
BSPLAT GLND RVRS SHLDR CENTRD (Plate) ×1 IMPLANT
CHLORAPREP W/TINT 26 (MISCELLANEOUS) ×1 IMPLANT
COOLER POLAR GLACIER W/PUMP (MISCELLANEOUS) ×1 IMPLANT
DRAPE INCISE IOBAN 66X45 STRL (DRAPES) ×1 IMPLANT
DRAPE SHEET LG 3/4 BI-LAMINATE (DRAPES) ×1 IMPLANT
DRAPE TABLE BACK 80X90 (DRAPES) ×1 IMPLANT
DRILL BIT 3MM FOR 4.5 SCREW (BIT) ×1
DRSG OPSITE POSTOP 4X8 (GAUZE/BANDAGES/DRESSINGS) ×1 IMPLANT
ELECT BLADE 6.5 EXT (BLADE) IMPLANT
ELECT CAUTERY BLADE 6.4 (BLADE) ×1 IMPLANT
ELECT REM PT RETURN 9FT ADLT (ELECTROSURGICAL) ×1
ELECTRODE REM PT RTRN 9FT ADLT (ELECTROSURGICAL) ×1 IMPLANT
GAUZE XEROFORM 1X8 LF (GAUZE/BANDAGES/DRESSINGS) ×1 IMPLANT
GLENOID BSPLAT RVS SHLDR CNTRD (Plate) IMPLANT
GLENOSPHERE CON AETOS 34 RSS (Shoulder) IMPLANT
GLOVE BIO SURGEON STRL SZ7.5 (GLOVE) ×4 IMPLANT
GLOVE BIO SURGEON STRL SZ8 (GLOVE) ×4 IMPLANT
GLOVE BIOGEL PI IND STRL 8 (GLOVE) ×2 IMPLANT
GLOVE INDICATOR 8.0 STRL GRN (GLOVE) ×1 IMPLANT
GOWN STRL REUS W/ TWL LRG LVL3 (GOWN DISPOSABLE) ×1 IMPLANT
GOWN STRL REUS W/ TWL XL LVL3 (GOWN DISPOSABLE) ×1 IMPLANT
GOWN STRL REUS W/TWL LRG LVL3 (GOWN DISPOSABLE) ×1
GOWN STRL REUS W/TWL XL LVL3 (GOWN DISPOSABLE) ×1
GUIDE PIN GLENOID 2.5 NT (PIN) ×1
GUIDE PIN NUMERAL 3.5 NT (PIN) ×2
HANDLE YANKAUER SUCT OPEN TIP (MISCELLANEOUS) ×1 IMPLANT
HOOD PEEL AWAY T7 (MISCELLANEOUS) ×3 IMPLANT
HUMERAL PIN IMPLANT
IV NS IRRIG 3000ML ARTHROMATIC (IV SOLUTION) ×1 IMPLANT
KIT STABILIZATION SHOULDER (MISCELLANEOUS) ×1 IMPLANT
KIT TURNOVER KIT A (KITS) ×1 IMPLANT
LINER REVERSE RSS 34 SM +0 (Liner) IMPLANT
MANIFOLD NEPTUNE II (INSTRUMENTS) ×1 IMPLANT
MASK FACE SPIDER DISP (MASK) ×1 IMPLANT
MAT ABSORB FLUID 56X50 GRAY (MISCELLANEOUS) ×1 IMPLANT
NDL MAYO CATGUT SZ1 (NEEDLE) IMPLANT
NDL SAFETY ECLIP 18X1.5 (MISCELLANEOUS) ×1 IMPLANT
NDL SPNL 20GX3.5 QUINCKE YW (NEEDLE) ×1 IMPLANT
NEEDLE MAYO CATGUT SZ1 (NEEDLE) IMPLANT
NEEDLE SPNL 20GX3.5 QUINCKE YW (NEEDLE) ×1 IMPLANT
NS IRRIG 500ML POUR BTL (IV SOLUTION) ×1 IMPLANT
PACK ARTHROSCOPY SHOULDER (MISCELLANEOUS) ×1 IMPLANT
PAD ARMBOARD 7.5X6 YLW CONV (MISCELLANEOUS) ×1 IMPLANT
PAD WRAPON POLAR SHDR UNIV (MISCELLANEOUS) ×1 IMPLANT
PIN GUIDE GLENOID 2.5 NT (PIN) IMPLANT
PIN GUIDE NUMERAL 3.5 NT (PIN) IMPLANT
PULSAVAC PLUS IRRIG FAN TIP (DISPOSABLE) ×1
SCREW CENTER 4.5X-35 RSS (Screw) IMPLANT
SCREW LOCK 4.5X15 RSS (Screw) IMPLANT
SCREW LOCK 4.5X35 RSS (Screw) IMPLANT
SLING ULTRA II LG (MISCELLANEOUS) IMPLANT
SLING ULTRA II M (MISCELLANEOUS) IMPLANT
SPONGE T-LAP 18X18 ~~LOC~~+RFID (SPONGE) ×2 IMPLANT
STAPLER SKIN PROX 35W (STAPLE) ×1 IMPLANT
STEM HUM AETOS SZ 2 SM RSS (Stem) IMPLANT
SUT ETHIBOND 0 MO6 C/R (SUTURE) ×1 IMPLANT
SUT FIBERWIRE #2 38 BLUE 1/2 (SUTURE) ×2
SUT VIC AB 0 CT1 36 (SUTURE) ×1 IMPLANT
SUT VIC AB 2-0 CT1 27 (SUTURE) ×2
SUT VIC AB 2-0 CT1 TAPERPNT 27 (SUTURE) ×2 IMPLANT
SUTURE FIBERWR #2 38 BLUE 1/2 (SUTURE) ×4 IMPLANT
SYR 10ML LL (SYRINGE) ×1 IMPLANT
SYR 30ML LL (SYRINGE) ×1 IMPLANT
SYR TOOMEY 50ML (SYRINGE) ×1 IMPLANT
TIP FAN IRRIG PULSAVAC PLUS (DISPOSABLE) ×1 IMPLANT
TRAP FLUID SMOKE EVACUATOR (MISCELLANEOUS) ×1 IMPLANT
WATER STERILE IRR 1000ML POUR (IV SOLUTION) IMPLANT
WATER STERILE IRR 500ML POUR (IV SOLUTION) ×1 IMPLANT
WRAPON POLAR PAD SHDR UNIV (MISCELLANEOUS) ×1

## 2023-08-02 NOTE — Transfer of Care (Signed)
Immediate Anesthesia Transfer of Care Note  Patient: [name]  Procedure(s) Performed: REVERSE SHOULDER ARTHROPLASTY (Right: Shoulder)  Patient Location: PACU  Anesthesia Type:General  Level of Consciousness: awake, alert , and patient cooperative  Airway & Oxygen Therapy: Patient Spontanous Breathing and Patient connected to face mask oxygen  Post-op Assessment: Report given to RN and Post -op Vital signs reviewed and stable  Post vital signs: Reviewed and stable  Last Vitals:  Vitals Value Taken Time  BP 158/80 08/02/23 1406  Temp 36.6   Pulse 86 08/02/23 1411  Resp 20 08/02/23 1411  SpO2 96 % 08/02/23 1411  Vitals shown include unfiled device data.  Last Pain:  Vitals:   08/02/23 0806  TempSrc: Temporal  PainSc: 3          Complications: There were no known notable events for this encounter.

## 2023-08-02 NOTE — Anesthesia Preprocedure Evaluation (Addendum)
Anesthesia Evaluation  Patient identified by MRN, date of birth, ID band Patient awake    Reviewed: Allergy & Precautions, H&P , NPO status , Patient's Chart, lab work & pertinent test results, reviewed documented beta blocker date and time   History of Anesthesia Complications Negative for: history of anesthetic complications  Airway Mallampati: II  TM Distance: >3 FB Neck ROM: Full    Dental  (+) Edentulous Upper, Missing,    Pulmonary shortness of breath, asthma , neg sleep apnea, COPD,  COPD inhaler, Not current smoker, former smoker   Pulmonary exam normal        Cardiovascular Exercise Tolerance: Poor hypertension, Pt. on medications and Pt. on home beta blockers + CAD (09/11/2011: minor luminal irregulaties mLAD, mLCx, mRCA - med mgmt.), + Past MI, +CHF (Grade II DD) and + DOE  (-) dysrhythmias + Valvular Problems/Murmurs (Nonrheumatic mitral valve regurgitation) MR  Rhythm:Regular Rate:Normal + Peripheral Edema Peripheral Edema  ECHO 02/2021: 1. Left ventricular ejection fraction, by estimation, is 60 to 65%. The  left ventricle has normal function. The left ventricle has no regional  wall motion abnormalities. Left ventricular diastolic parameters are  consistent with Grade II diastolic  dysfunction (pseudonormalization).  2. Right ventricular systolic function is normal. The right ventricular  size is normal. There is normal pulmonary artery systolic pressure. The  estimated right ventricular systolic pressure is 32.6 mmHg.  3. Left atrial size was mildly dilated.   Neuro/Psych  Headaches, neg Seizures  Anxiety Depression    Cerebral aneurysm history of coils TIA Neuromuscular disease (Nueropathy) CVA (Pt unaware of CVAs, noted on scan)    GI/Hepatic Neg liver ROS,GERD  Medicated and Controlled,,  Endo/Other  diabetes, Type 2, Oral Hypoglycemic Agents  Morbid obesity  Renal/GU Renal InsufficiencyRenal disease  Bladder dysfunction      Musculoskeletal  (+) Arthritis ,    Abdominal  (+) + obese  Peds negative pediatric ROS (+)  Hematology negative hematology ROS (+)   Anesthesia Other Findings Glaucoma  PAT note reviewed  Per cardiology, "though Ms. Loescher's functional capacity is limited, she can perform 4 METS of activity without chest pain or dyspnea. She can proceed with her shoulder surgery as planned, which is a low-risk procedure, without further cardiac testing or intervention  Reproductive/Obstetrics negative OB ROS                             Anesthesia Physical Anesthesia Plan  ASA: 3  Anesthesia Plan: General   Post-op Pain Management: Regional block*   Induction: Intravenous  PONV Risk Score and Plan: 3 and Treatment may vary due to age or medical condition, Ondansetron and Dexamethasone  Airway Management Planned: Oral ETT  Additional Equipment:   Intra-op Plan:   Post-operative Plan: Extubation in OR  Informed Consent: I have reviewed the patients History and Physical, chart, labs and discussed the procedure including the risks, benefits and alternatives for the proposed anesthesia with the patient or authorized representative who has indicated his/her understanding and acceptance.       Plan Discussed with: CRNA and Anesthesiologist  Anesthesia Plan Comments:         Anesthesia Quick Evaluation

## 2023-08-02 NOTE — Anesthesia Procedure Notes (Signed)
Anesthesia Regional Block: Interscalene brachial plexus block   Pre-Anesthetic Checklist: , timeout performed,  Correct Patient, Correct Site, Correct Laterality,  Correct Procedure, Correct Position, site marked,  Risks and benefits discussed,  Surgical consent,  Pre-op evaluation,  At surgeon's request and post-op pain management  Laterality: Upper and Right  Prep: chloraprep       Needles:  Injection technique: Single-shot  Needle Type: Stimiplex     Needle Length: 9cm  Needle Gauge: 22     Additional Needles:   Procedures:,,,, ultrasound used (permanent image in chart),,    Narrative:  Start time: 08/02/2023 10:32 AM End time: 08/02/2023 10:37 AM Injection made incrementally with aspirations every 5 mL.  Performed by: Personally  Anesthesiologist: Foye Deer, MD  Additional Notes: Patient consented for risk and benefits of nerve block including but not limited to nerve damage, Horner's syndrome, failed block, bleeding and infection.  Patient voiced understanding.  Functioning IV was confirmed and monitors were applied.  Timeout done prior to procedure and prior to any sedation being given to the patient.  Patient confirmed procedure site prior to any sedation given to the patient.  A 50mm 22ga Stimuplex needle was used. Sterile prep,hand hygiene and sterile gloves were used.  Minimal sedation used for procedure.  No paresthesia endorsed by patient during the procedure.  Negative aspiration and negative test dose prior to incremental administration of local anesthetic. The patient tolerated the procedure well with no immediate complications.

## 2023-08-02 NOTE — Evaluation (Signed)
Physical Therapy Evaluation Patient Details Name: Kristine Rangel MRN: [id number] DOB: [birth year] Today's Date: 08/02/2023  History of Present Illness  Pt is a 67 y.o. 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  Clinical Impression  Prior to surgery, pt was modified independent ambulating short distances with rollator; lives alone but plans to discharge to her caregivers home (so caregiver can provide 24/7 assist).  During session pt CGA with transfers; CGA with ambulation 50 feet with SBQC use; and CGA to navigate 1 step.  Trialed SPC but pt only briefly used d/t preferring SBQC (pt appearing steadier using SBQC).  Pt's caregiver present during session and reports no concerns for pt's discharge (pt also reporting no concerns); on the way home from hospital, pt's caregiver plans to stop at pt's home to pick up pt's manual w/c and also plans to stop at store to pick up small based quad cane (caregiver already looked up a store with [facility] in stock via her phone and preferred to pick it up this way).  Pt would currently benefit from skilled PT to address noted impairments and functional limitations (see below for any additional details).  Upon hospital discharge, pt would benefit from ongoing therapy.     If plan is discharge home, recommend the following: A little help with walking and/or transfers;A little help with bathing/dressing/bathroom;Assistance with cooking/housework;Assist for transportation;Help with stairs or ramp for entrance   Can travel by private vehicle    Yes    Equipment Recommendations Other (comment) (small base  quad cane (pt's caregiver reports she already found one at a store--looked on her phone--and plans to pick one up on the way home with pt))  Recommendations for Other Services       Functional Status Assessment Patient has had a recent decline in their functional status and demonstrates the ability to make significant improvements in function in a reasonable and predictable amount of time.     Precautions / Restrictions Precautions Precautions: Shoulder;Fall Shoulder Interventions: Off for dressing/bathing/exercises;At all times;Don joy ultra sling;Shoulder abduction pillow;Shoulder sling/immobilizer Precaution Booklet Issued: Yes (comment) Restrictions Weight Bearing Restrictions: Yes RUE Weight Bearing: Non weight bearing      Mobility  Bed Mobility Overal bed mobility: Needs Assistance Bed Mobility: Sit to Supine       Sit to supine: Min assist (assist for LE's)   General bed mobility comments: increased effort to perform    Transfers Overall transfer level: Needs assistance Equipment used: Quad cane Transfers: Sit to/from Stand Sit to Stand: Contact guard assist           General transfer comment: mild increased effort to stand (x3 trials from bed; x1 trial from chair; x1 trial from transport chair)    Ambulation/Gait Ambulation/Gait assistance: Contact guard assist Gait Distance (Feet): 50 Feet Assistive device: Quad cane Gait Pattern/deviations: Step-through pattern, Decreased step length - right, Decreased step length - left Gait velocity: decreased     General Gait Details: increased B lateral sway  Stairs Stairs: Yes Stairs assistance: Contact guard assist Stair Management: One rail Left Number of Stairs: 1 General stair comments: ascended 1 step forward and descended 1 step backwards; steady and appearing fairly strong  Wheelchair Mobility     Tilt Bed    Modified Rankin (Stroke Patients Only)       Balance Overall balance assessment:  Needs assistance Sitting-balance support: No upper extremity supported, Feet supported Sitting balance-Leahy Scale: Good Sitting balance - Comments: steady reaching within BOS   Standing balance support: Single extremity supported, During functional activity, Reliant on assistive device for balance Standing balance-Leahy Scale: Good Standing balance comment: no loss of balance noted with ambulation                             Pertinent Vitals/Pain Pain Assessment Pain Assessment: No/denies pain Vitals (HR and SpO2 on room air) stable and WFL throughout treatment session.    Home Living Family/patient expects to be discharged to:: Private residence Living Arrangements: Alone (Plans to discharge to her caregivers home (rest of set-up is caregivers house)) Available Help at Discharge: Personal care attendant;Available 24 hours/day Type of Home: House Home Access: Stairs to enter Entrance Stairs-Rails:  (holds onto door frame) Entrance Stairs-Number of Steps: 1   Home Layout: One level Home Equipment: BSC/3in1;Cane - single point;Rollator (4 wheels);Shower seat Additional Comments: Home information above for caregiver's home where pt will be staying for 9 weeks during recovery. Caregiver is having an adjustable bed delivered.    Prior Function Prior Level of Function : Needs assist             Mobility Comments: Pt primarily ambulating short distances with rollator; SPC PRN; no falls in past 6 months ADLs Comments: Per OT eval "Caregiver/aide assists daily with bathing, dressing, cooking, cleaning. Pt indep with med mgt."     Extremity/Trunk Assessment   Upper Extremity Assessment Upper Extremity Assessment: Defer to OT evaluation RUE Deficits / Details: s/p TSA RUE: Unable to fully assess due to immobilization RUE Sensation: decreased light touch;decreased proprioception RUE Coordination: decreased fine motor;decreased gross motor    Lower Extremity  Assessment Lower Extremity Assessment: Generalized weakness    Cervical / Trunk Assessment Cervical / Trunk Assessment: Other exceptions Cervical / Trunk Exceptions: forward head/shoulders  Communication   Communication Communication: No apparent difficulties Cueing Techniques: Verbal cues  Cognition Arousal: Alert Behavior During Therapy: Flat affect (intermittently smiling end of session) Overall Cognitive Status: Within Functional Limits for tasks assessed                                          General Comments General comments (skin integrity, edema, etc.): R UE in sling/immobilizer    Exercises     Assessment/Plan    PT Assessment Patient needs continued PT services  PT Problem List Decreased strength;Decreased range of motion;Decreased activity tolerance;Decreased balance;Decreased mobility;Decreased knowledge of use of DME;Decreased knowledge of precautions;Impaired sensation;Decreased skin integrity;Pain       PT Treatment Interventions DME instruction;Gait training;Stair training;Functional mobility training;Therapeutic activities;Therapeutic exercise;Balance training;Patient/family education    PT Goals (Current goals can be found in the Care Plan section)  Acute Rehab PT Goals Patient Stated Goal: to improve mobility PT Goal Formulation: With patient Time For Goal Achievement: 08/16/23 Potential to Achieve Goals: Good    Frequency BID     Co-evaluation               AM-PAC PT "6 Clicks" Mobility  Outcome Measure Help needed turning from your back to your side while in a flat  bed without using bedrails?: A Little Help needed moving from lying on your back to sitting on the side of a flat bed without using bedrails?: A Little Help needed moving to and from a bed to a chair (including a wheelchair)?: A Little Help needed standing up from a chair using your arms (e.g., wheelchair or bedside chair)?: A Little Help needed to walk in  hospital room?: A Little Help needed climbing 3-5 steps with a railing? : A Little 6 Click Score: 18    End of Session Equipment Utilized During Treatment: Gait belt;Other (comment) (R UE sling/immobilizer) Activity Tolerance: Patient tolerated treatment well Patient left: in bed;with call bell/phone within reach;with bed alarm set;with family/visitor present;Other (comment) (polar care applied) Nurse Communication: Mobility status;Precautions;Weight bearing status PT Visit Diagnosis: Other abnormalities of gait and mobility (R26.89);Muscle weakness (generalized) (M62.81);Pain Pain - Right/Left: Right Pain - part of body: Shoulder    Time: 1610-9604 PT Time Calculation (min) (ACUTE ONLY): 27 min   Charges:   PT Evaluation $PT Eval Low Complexity: 1 Low PT Treatments $Gait Training: 8-22 mins PT General Charges $$ ACUTE PT VISIT: 1 Visit        Hendricks Limes, PT 08/02/23, 5:22 PM

## 2023-08-02 NOTE — TOC Progression Note (Signed)
Transition of Care [facility]) - Progression Note    Patient Details  Name: Kristine Rangel MRN: [id number] Date of Birth: Feb 12, 1956  [facility] CM/SW Contact  Marlowe Sax, RN Phone Number: 08/02/2023, 2:23 PM  Clinical Narrative:     The patient is set up with [facility] for [facility] services prior to Surgery by surgeons office       Expected Discharge Plan and Services                                               Social Determinants of Health (SDOH) Interventions SDOH Screenings   Tobacco Use: High Risk (08/02/2023)    Readmission Risk Interventions     No data to display

## 2023-08-02 NOTE — H&P (Signed)
History of Present Illness:  Kristine Rangel is a 67 y.o. female who presents today as a result of a referral by Dr. Juanell Fairly for right shoulder pain and weakness.   The patient underwent a mini open rotator cuff repair with distal decompression and distal clavicle excision about 5 years ago by Dr. Martha Clan, from which she had done quite well. Her present symptoms began several months ago and developed without any specific cause or injury . She followed up with Dr. Martha Clan who ordered an MRI scan. This study confirmed the presence of a recurrent massive rotator cuff tear with a high riding humeral head and progressive degenerative changes of the glenohumeral joint, all consistent with cuff arthropathy, and so referred her to me for further evaluation and treatment. The patient describes the symptoms as marked (major pain with significant limitations) and have the quality of being aching, constant, miserable, nagging, stabbing, tender, and throbbing. The pain is localized to the lateral arm/shoulder and localized to the anterior shoulder. These symptoms are aggravated constantly, with normal daily activities, with sleeping, carrying heavy objects, at higher levels of activity, with overhead activity, and reaching behind the back. She has tried narcotics and Lidoderm patches with limited benefit. She has tried rest , ice , and heat with temporary partial relief. She has not tried any physical therapy or steroid injections for these recent symptoms. She does note some neck stiffness as well as some numbness and paresthesias down her arm to her hand. This complaint is not work related. She is a sports non-participant.  Shoulder Surgical History:  The patient has had a rotator cuff repair, a decompression, and a distal clavicle excision in the past.  PMH/PSH/Family History/Social History/Meds/Allergies:  I have reviewed past medical, surgical, social and family history, medications and allergies  as documented in the EMR.  Current Outpatient Medications:  albuterol (PROVENTIL) 2.5 mg /3 mL (0.083 %) nebulizer solution Take 2.5 mg by nebulization every 6 (six) hours as needed for Wheezing  albuterol MDI, PROVENTIL, VENTOLIN, PROAIR, HFA 90 mcg/actuation inhaler Inhale 2 inhalations into the lungs every 4 (four) hours as needed for Wheezing or Shortness of Breath  ALPRAZolam (XANAX) 0.5 MG tablet Take 0.5 mg by mouth once daily as needed for Anxiety  aspirin 81 MG chewable tablet Take 81 mg by mouth.  atorvastatin (LIPITOR) 80 MG tablet Take 80 mg by mouth once daily  BELSOMRA 15 mg Tab Take 1 tablet by mouth at bedtime  brimonidine-timolol (COMBIGAN) 0.2-0.5 % ophthalmic solution 1 drop every twelve (12) hours.  brinzolamide (AZOPT) 1 % ophthalmic suspension 1 drop Three (3) times a day.  budesonide-formoterol (SYMBICORT) 160-4.5 mcg/actuation inhaler Inhale 2 inhalations into the lungs 2 (two) times daily  carvediloL (COREG) 6.25 MG tablet Take 1 tablet by mouth 2 (two) times daily  DULoxetine (CYMBALTA) 60 MG DR capsule Take 60 mg by mouth once daily  EPINEPHrine (EPIPEN) 0.3 mg/0.3 mL pen injector Inject 0.3 mg subcutaneously.  esomeprazole (NEXIUM) 40 MG DR capsule Take 40 mg by mouth once daily  fluticasone (FLONASE) 50 mcg/actuation nasal spray Place 2 sprays into both nostrils once daily as needed for Rhinitis or Allergies  FUROsemide (LASIX) 40 MG tablet TAKE ONE TABLET BY MOUTH TWICE DAILY 60 tablet 0  gabapentin (NEURONTIN) 300 MG capsule  HYDROcodone-acetaminophen (NORCO) 7.5-325 mg tablet Take 1 tablet by mouth every 6 (six) hours as needed  isosorbide mononitrate (IMDUR) 60 MG ER tablet Take 60 mg by mouth once daily  JARDIANCE 25 mg  tablet Take 25 mg by mouth once daily  lidocaine (LIDODERM) 5 % patch Place 2 patches onto the skin daily  meloxicam (MOBIC) 7.5 MG tablet Take 7.5 mg by mouth once daily  methocarbamoL (ROBAXIN) 500 MG tablet Take 500 mg by mouth 3 (three)  times daily as needed  MYRBETRIQ 50 mg ER tablet Take 50 mg by mouth once daily  nicotine (NICODERM CQ) 14 mg/24 hr patch Apply one patch daily x 12 weeks. 28 patch 2  nicotine polacrilex (NICORETTE) 4 MG gum Chew gum a few times then place between cheek and gum. Use every 30 minutes as needed for smoking urges. Up to 10 a day 100 each 2  OZEMPIC 0.25 mg or 0.5 mg (2 mg/3 mL) pen injector Inject 0.25 mg subcutaneously once a week  potassium chloride (KLOR-CON) 10 MEQ ER tablet Take by mouth.  solifenacin (VESICARE) 10 MG tablet Take 10 mg by mouth once daily.  tiZANidine (ZANAFLEX) 4 MG tablet Take 4 mg by mouth every 8 (eight) hours as needed for Muscle spasms  topiramate (TOPAMAX) 100 MG tablet Take 100 mg by mouth once daily  valsartan (DIOVAN) 40 MG tablet Take 40 mg by mouth once daily  zolpidem (AMBIEN) 10 mg tablet Take 10 mg by mouth at bedtime as needed for Sleep  atorvastatin (LIPITOR) 40 MG tablet Take 40 mg by mouth. (Patient not taking: Reported on 07/06/2023)  carvediloL (COREG) 3.125 MG tablet (Patient not taking: Reported on 07/06/2023)  HYDROcodone-acetaminophen (NORCO) 5-325 mg tablet 1 tablet every 8 (eight) hours as needed for Pain (Patient not taking: Reported on 07/06/2023)  MYRBETRIQ 25 mg ER Tablet (Patient not taking: Reported on 07/06/2023)  nitroGLYcerin (NITROSTAT) 0.4 MG SL tablet (Patient not taking: Reported on 07/06/2023)  potassium chloride (KLOR-CON M10) 10 mEq ER tablet Take 10 mEq by mouth once daily (Patient not taking: Reported on 07/06/2023)  tiZANidine (ZANAFLEX) 2 MG tablet Take 2 mg by mouth. (Patient not taking: Reported on 07/06/2023)   Allergies:  Venom-Honey Bee Anaphylaxis  Penicillins Hives   Past Medical History:  History of chicken pox  Hyperlipidemia   Past Surgical History:  COLONOSCOPY 08/09/2021 (Diverticulosis/Otherwise normal colon/PHx CP/Repeat 77yrs/CTL)  EGD 08/09/2021 (Reflux esophagitis/Repeat as needed/CTL)  ARTHROSCOPY SHOULDER  Bilateral  CRANIOPLASTY FOR REPAIR SKULL DEFECT W/REPARATIVE BRAIN SURGERY  CRANIOTOMY FOR ANEURYSM REPAIR  JOINT REPLACEMENT Bilateral Knee  KNEE ARTHROSCOPY   Family History:  High blood pressure (Hypertension) Maternal Grandmother   Social History:   Socioeconomic History:  Marital status: Single  Tobacco Use  Smoking status: Former  Smokeless tobacco: Never  Tobacco comments:  DIP SNUFF  Substance and Sexual Activity  Alcohol use: No  Alcohol/week: 0.0 standard drinks of alcohol  Drug use: Never  Sexual activity: Defer   Review of Systems:  A comprehensive 14 point ROS was performed, reviewed, and the pertinent orthopaedic findings are documented in the HPI.  Physical Exam:  Vitals:  07/06/23 1131 07/06/23 1156  BP: (!) 140/84  Weight: (!) 123.4 kg (272 lb)  Height: 165.1 cm (5\' 5" )  PainSc: 8 8  PainLoc: Shoulder   General/Constitutional: Pleasant significantly overweight middle-aged female in no acute distress. Neuro/Psych: Normal mood and affect, oriented to person, place and time. Eyes: Non-icteric. Pupils are equal, round, and reactive to light, and exhibit synchronous movement. ENT: Unremarkable. Lymphatic: No palpable adenopathy. Respiratory: Lungs clear to auscultation, Normal chest excursion, No wheezes, and Non-labored breathing Cardiovascular: Regular rate and rhythm. No murmurs. and No edema, swelling or tenderness, except  as noted in detailed exam. Integumentary: No impressive skin lesions present, except as noted in detailed exam. Musculoskeletal: Unremarkable, except as noted in detailed exam.  Right shoulder exam: SKIN: normal SWELLING: none WARMTH: none LYMPH NODES: no adenopathy palpable CREPITUS: none TENDERNESS: Mildly tender to palpation over the anterolateral shoulder ROM (active):  Forward flexion: 100 degrees Abduction: 85 degrees Internal rotation: Right buttock ROM (passive):  Forward flexion: 125 degrees Abduction: 100 degrees   ER/IR at 90 abd: 75 degrees / 40 degrees  She has moderate to severe pain at the extremes of all motions.  STRENGTH: Forward flexion: 3/5 Abduction: 3/5 External rotation: 4/5 Internal rotation: 4-4+/5 Pain with RC testing: Moderate pain with resisted forward flexion and and abduction.  STABILITY: Normal  SPECIAL TESTS: Juanetta Gosling' test: positive, moderate Speed's test: negative Capsulitis - pain w/ passive ER: no Crossed arm test: Mildly positive Crank: Not evaluated Anterior apprehension: Negative Posterior apprehension: Not evaluated  Cervical Spine:  Supple, mildly tender posteriorly.  ROM: Flexion: 40 degrees Extension: 30 degrees Left/Right turn: 55 degrees / 55 degrees Left/Right tilt: 25 degrees/25 degrees  She has mild pain with all motions. Neck motion does not reproduce the patient's shoulder symptoms. She has a negative Spurling's test bilaterally. She is neurovascularly intact to the right upper extremity.  Imaging:  Shoulder X-Ray Imaging: True AP, Y-scapular, and axillary views of the right shoulder are obtained. These films demonstrate moderate degenerative changes as manifest by superior glenohumeral joint space narrowing. The subacromial space is markedly decreased. There is no subacromial or infra-clavicular spurring. She demonstrates a Type I acromion.  Cervical X-Ray Imaging: AP and lateral views of the cervical spine are obtained. These films demonstrate moderate multilevel degenerative changes involving the mid and lower cervical spine as manifest by disc base narrowing and small anterior osteophyte formation. There is no evidence of spondylolysis or spondylolisthesis. No lytic lesions or fractures are identified.  Assessment:  1. Rotator cuff arthropathy, right.  2. Status post right rotator cuff repair.  3. Rotator cuff tendinitis, right.   Plan:  The treatment options were discussed with the patient and her home health aide. In addition, patient  educational materials were provided regarding the diagnosis and treatment options. The patient is quite frustrated by her symptoms and functional limitations, and is ready to consider more aggressive treatment options. Therefore, I have recommended a surgical procedure, specifically a reverse right total shoulder arthroplasty. The procedure was discussed with the patient, as were the potential risks (including bleeding, infection, nerve and/or blood vessel injury, persistent or recurrent pain, loosening and/or failure of the components, dislocation, need for further surgery, blood clots, strokes, heart attacks and/or arhythmias, pneumonia, etc.) and benefits. The patient states her understanding and wishes to proceed. All of the patient's questions and concerns were answered. She can call any time with further concerns. She will follow up post-surgery, routine.    H&P reviewed and patient re-examined. No changes.

## 2023-08-02 NOTE — Discharge Instructions (Addendum)
Orthopedic discharge instructions: May shower with intact OpSite dressing once nerve block has worn off (around Monday).  Apply ice frequently to shoulder or use Polar Care device. Take ibuprofen 600-800 mg TID with meals for 3-5 days, then as necessary. Take oxycodone as prescribed when needed.  May supplement with ES Tylenol if necessary. Keep shoulder immobilizer on at all times except may remove for bathing purposes. Follow-up in 10-14 days or as scheduled.   POLAR CARE INFORMATION  MassAdvertisement.it  How to use [employer] Polar Care [facility] Therapy [facility]?  YouTube   ShippingScam.co.uk  OPERATING INSTRUCTIONS  Start the product With dry hands, connect the transformer to the electrical connection located on the top of the cooler. Next, plug the transformer into an appropriate electrical outlet. The unit will automatically start running at this point.  To stop the pump, disconnect electrical power.  Unplug to stop the product when not in use. Unplugging the Polar Care unit turns it off. Always unplug immediately after use. Never leave it plugged in while unattended. Remove pad.    FIRST ADD WATER TO FILL LINE, THEN ICE---Replace ice when existing ice is almost melted  1 Discuss Treatment with your Licensed Health Care Practitioner and Use Only as Prescribed 2 Apply Insulation Barrier & Cold Therapy Pad 3 Check for Moisture 4 Inspect Skin Regularly  Tips and Trouble Shooting Usage Tips 1. Use cubed or chunked ice for optimal performance. 2. It is recommended to drain the Pad between uses. To drain the pad, hold the Pad upright with the hose pointed toward the ground. Depress the black plunger and allow water to drain out. 3. You may disconnect the Pad from the unit without removing the pad from the affected area by depressing the silver tabs on the hose coupling and gently pulling the hoses apart. The Pad and unit will seal itself and will not leak. Note: Some  dripping during release is normal. 4. DO NOT RUN PUMP WITHOUT WATER! The pump in this unit is designed to run with water. Running the unit without water will cause permanent damage to the pump. 5. Unplug unit before removing lid.  TROUBLESHOOTING GUIDE Pump not running, Water not flowing to the pad, Pad is not getting cold 1. Make sure the transformer is plugged into the wall outlet. 2. Confirm that the ice and water are filled to the indicated levels. 3. Make sure there are no kinks in the pad. 4. Gently pull on the blue tube to make sure the tube/pad junction is straight. 5. Remove the pad from the treatment site and ll it while the pad is lying at; then reapply. 6. Confirm that the pad couplings are securely attached to the unit. Listen for the double clicks (Figure 1) to confirm the pad couplings are securely attached.  Leaks    Note: Some condensation on the lines, controller, and pads is unavoidable, especially in warmer climates. 1. If using a [employer] Polar Care Cold Therapy unit with a detachable Cold Therapy Pad, and a leak exists (other than condensation on the lines) disconnect the pad couplings. Make sure the silver tabs on the couplings are depressed before reconnecting the pad to the pump hose; then confirm both sides of the coupling are properly clicked in. 2. If the coupling continues to leak or a leak is detected in the pad itself, stop using it and call [employer] Customer Care at [phone].  Cleaning After use, empty and dry the unit with a soft cloth. Warm water  and mild detergent may be used occasionally to clean the pump and tubes.  WARNING: The Polar Care Cube can be cold enough to cause serious injury, including full skin necrosis. Follow these Operating Instructions, and carefully read the Product Insert (see pouch on side of unit) and the Cold Therapy Pad Fitting Instructions (provided with each Cold Therapy Pad) prior to use.      AMBULATORY SURGERY  DISCHARGE  INSTRUCTIONS   The drugs that you were given will stay in your system until tomorrow so for the next 24 hours you should not:  Drive an automobile Make any legal decisions Drink any alcoholic beverage   You may resume regular meals tomorrow.  Today it is better to start with liquids and gradually work up to solid foods.  You may eat anything you prefer, but it is better to start with liquids, then soup and crackers, and gradually work up to solid foods.   Please notify your doctor immediately if you have any unusual bleeding, trouble breathing, redness and pain at the surgery site, drainage, fever, or pain not relieved by medication.    Additional Instructions: PLEASE LEAVE TEAL BRACELET ON FOR 4 DAYS        Please contact your physician with any problems or Same Day Surgery at [phone], Monday through Friday 6 am to 4 pm, or [place] at [facility] number at [phone].

## 2023-08-02 NOTE — Anesthesia Procedure Notes (Signed)
Procedure Name: Intubation Date/Time: 08/02/2023 11:45 AM  Performed by: Monico Hoar, CRNAPre-anesthesia Checklist: Patient identified, Patient being monitored, Timeout performed, Emergency Drugs available and Suction available Patient Re-evaluated:Patient Re-evaluated prior to induction Oxygen Delivery Method: Circle system utilized Preoxygenation: Pre-oxygenation with 100% oxygen Induction Type: IV induction Ventilation: Mask ventilation without difficulty Laryngoscope Size: Miller and 2 Grade View: Grade I Tube type: Oral Tube size: 7.0 mm Number of attempts: 1 Airway Equipment and Method: Stylet Placement Confirmation: positive ETCO2, breath sounds checked- equal and bilateral and ETT inserted through vocal cords under direct vision Secured at: 21 cm Tube secured with: Tape Dental Injury: Teeth and Oropharynx as per pre-operative assessment

## 2023-08-02 NOTE — Op Note (Signed)
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  Post-Op Diagnosis:   Same with biceps tendinopathy.  Procedure:   Reverse right total shoulder arthroplasty with biceps tenodesis.  Surgeon:   Maryagnes Amos, MD  Assistant:   Griffin Basil, RNFA; Jacqulyn Liner, PA-S  Anesthesia:   General endotracheal with an interscalene block placed preoperatively by the anesthesiologist.  Findings:   As above.  Complications:   None  EBL:   100 cc  Fluids:   400 cc crystalloid  UOP:   None  TT:   None  Drains:   None  Closure:   Staples  Implants:   All press-fit [employer] AETOS system with a size 2 small Meta humeral stem, a 34 mm +0 mm liner, a centered baseplate, and a 34 mm concentric +1 mm laterally offset glenosphere.  Brief Clinical Note:   The patient is a 67 year old female who is now 6.5 years status post a right shoulder arthroscopy with mini-open rotator cuff repair subacromial decompression, and distal clavicle excision initially the patient did well but over the past year or so, she has developed progressively worsening pain and weakness of her right shoulder. Subsequent workup, including an MRI scan has confirmed the presence of a massive recurrent rotator cuff tear with progressive degenerative joint disease, consistent with rotator cuff arthropathy. Her symptoms failed to improve despite extensive nonsurgical treatment. The patient presents at this time for a reverse right total shoulder arthroplasty.  Procedure:   The patient underwent placement of an interscalene block by the anesthesiologist in the preoperative holding area before being brought into the operating room and lain in the supine position. The patient then underwent general endotracheal intubation and anesthesia before the patient was repositioned in the beach chair position using the beach chair  positioner. The right shoulder and upper extremity were prepped with ChloraPrep solution before being draped sterilely. Preoperative antibiotics were administered.   A timeout was performed to verify the appropriate surgical site before a standard anterior approach to the shoulder was made through an approximately 4-5 inch incision. The incision was carried down through the subcutaneous tissues to expose the deltopectoral fascia. The interval between the deltoid and pectoralis muscles was identified and this plane developed, retracting the cephalic vein laterally with the deltoid muscle. The conjoined tendon was identified. Its lateral margin was dissected and the Kolbel self-retraining retractor inserted. The "three sisters" were identified and cauterized. Bursal tissues were removed to improve visualization.   The biceps tendon was identified near the inferior aspect of the bicipital groove. A soft tissue tenodesis was performed by attaching the biceps tendon to the adjacent pectoralis major tendon using two #0 Ethibond interrupted sutures. The biceps tendon was then transected just proximal to the tenodesis site. The subscapularis tendon was released from its attachment to the lesser tuberosity 1 cm proximal to its insertion and several tagging sutures placed. The inferior capsule was released with care after identifying and protecting the axillary nerve. The proximal humeral cut was made at approximately 20 of retroversion using the extra-medullary guide.   Attention was directed to the humeral side. The humeral cut surface was sized and found to be optimally replicated by the #2 sized component.  The central guide wire was inserted before the metaphyseal region was reamed with the appropriate reamer.  The punch was then inserted over the guidewire before the keel was  inserted to create the proper position for the fins.  The trial #2 metaphyseal humeral stem was inserted, then covered with the appropriate  cover.   Attention was redirected to the glenoid. The labrum was debrided circumferentially before the center of the glenoid was identified. The guidewire was drilled into the glenoid neck using the appropriate guide. After verifying its position, it was overreamed with the mini-baseplate reamer to create a flat surface before the stem reamer was utilized. The permanent mini-baseplate was impacted into place. It was stabilized with a 35 x 4.5 mm central screw and four peripheral screws. The permanent 34 mm concentric glenosphere with +1 mm of lateral offset was then impacted into place and its Morse taper locking mechanism verified using manual distraction.  The central set screw was then advanced and tightened securely.  Returning to the humeral side, the cover plate was removed and the trial +0 liner inserted. The shoulder was then reduced and placed through a range of motion. With the +0 mm insert, the arm demonstrated excellent range of motion as the hand could be brought across the chest to the opposite shoulder and brought to the top of the patient's head and to the patient's ear. The shoulder appeared stable throughout this range of motion. The joint was dislocated and the trial components removed. The permanent size 2 Meta humeral stem was impacted into place with care taken to maintain the appropriate version and valgus alignment. The permanent 34 mm +0 mm insert was impacted into place. After verifying its locking mechanism, the shoulder was relocated using two finger pressure and again placed through a range of motion with the findings as described above.  The wound was copiously irrigated with sterile saline solution using the jet lavage system before a total of 30 cc of 0.5% Sensorcaine with epinephrine was injected into the pericapsular and peri-incisional tissues to help with postoperative analgesia. The subscapularis tendon was reapproximated using #2 FiberWire interrupted sutures. The  deltopectoral interval was closed using #0 Vicryl interrupted sutures before the subcutaneous tissues were closed using 2-0 Vicryl interrupted sutures. The skin was closed using staples. Prior to closing the skin, 1 g of transexemic acid in 10 cc of normal saline was injected intra-articularly to help with postoperative bleeding. A sterile occlusive dressing was applied to the wound before the arm was placed into a shoulder immobilizer with an abduction pillow. A Polar Care system also was applied to the shoulder. The patient was then transferred back to a hospital bed before being awakened, extubated, and returned to the recovery room in satisfactory condition after tolerating the procedure well.

## 2023-08-02 NOTE — Evaluation (Signed)
Occupational Therapy Evaluation Patient Details Name: Kristine Rangel MRN: [id number] DOB: [birth year] Today's Date: 08/02/2023   History of Present Illness Pt is 67 yo female s/p R TSA 08/02/23. PMH of asthma, COPD, former smoker, past MI, HTN, anxiety. depression, CVA, s/p brain surgery (anuerysm repair), DMII, renal disease, CHF, rotator cuff surgery, glaucoma, R TKA revision.   Clinical Impression   Patient was seen for an OT evaluation this date. Pt lives alone but has a caregiver who assists daily with ADL and IADL. Pt plans to stay at caregiver's home for 9wks for recovery. Prior to surgery, pt was requiring assist for ADL and IADL, ambulating with rollator primarily. Pt has orders for RUE to be immobilized and will be NWBing per MD. Patient presents with impaired strength/ROM and sensation to RUE. These impairments result in a decreased ability to perform self care tasks requiring MAX assist for UB/LB dressing and bathing and MAX assist for application of polar care, compression stockings, and sling/immobilizer. Pt/caregiver instructed in polar care mgt, compression stockings mgt, sling/immobilizer mgt, ROM exercises for RUE, RUE precautions, adaptive strategies for bathing/dressing/toileting/grooming, positioning and considerations for sleep, and home/routines modifications to maximize falls prevention, safety, and independence. Handout provided. OT adjusted sling/immobilizer and polar care to improve comfort, optimize positioning, and to maximize skin integrity/safety. Pt/caregivers verbalized understanding of education/training provided. Pt will benefit from skilled OT services to address these limitations and improve independence in daily tasks.     If plan is discharge home, recommend the following: A little help with walking and/or transfers;A lot of help with bathing/dressing/bathroom;Assistance with cooking/housework;Assist for transportation;Help with stairs or ramp for entrance     Functional Status Assessment  Patient has had a recent decline in their functional status and demonstrates the ability to make significant improvements in function in a reasonable and predictable amount of time.  Equipment Recommendations  Other (comment);Tub/shower bench (reacher, defer to PT for AD)    Recommendations for Other Services       Precautions / Restrictions Precautions Precautions: Shoulder;Fall Shoulder Interventions: Off for dressing/bathing/exercises;At all times;Don joy ultra sling;Shoulder abduction pillow;Shoulder sling/immobilizer Precaution Booklet Issued: Yes (comment) Restrictions Weight Bearing Restrictions: Yes RUE Weight Bearing: Non weight bearing      Mobility Bed Mobility Overal bed mobility: Needs Assistance Bed Mobility: Supine to Sit     Supine to sit: Min assist, Used rails, HOB elevated     General bed mobility comments: increased effort    Transfers Overall transfer level: Needs assistance                 General transfer comment: defer to PT      Balance Overall balance assessment: Needs assistance Sitting-balance support: Feet supported, Single extremity supported Sitting balance-Leahy Scale: Fair                                     ADL either performed or assessed with clinical judgement   ADL                                         General ADL Comments: MAX A for LB ADL, MOD-MAX A for UB ADL, MIN A for grooming. Caregiver able to provide needed assist.     Vision         Perception  Praxis         Pertinent Vitals/Pain Pain Assessment Pain Assessment: No/denies pain     Extremity/Trunk Assessment Upper Extremity Assessment Upper Extremity Assessment: Generalized weakness;RUE deficits/detail RUE Deficits / Details: s/p TSA RUE: Unable to fully assess due to immobilization RUE Sensation: decreased light touch;decreased proprioception RUE Coordination:  decreased fine motor;decreased gross motor   Lower Extremity Assessment Lower Extremity Assessment: Defer to PT evaluation       Communication     Cognition Arousal: Alert Behavior During Therapy: Flat affect Overall Cognitive Status: Within Functional Limits for tasks assessed                                       General Comments       Exercises Other Exercises Other Exercises: Pt/caregivers educated in home/routines modifications, falls prevention, AE/DME, hemi techniques for ADL, positioning for RUE in bed/chair, polar care mgt, compression stocking mgt, sling mgt. Handout provided.   Shoulder Instructions      Home Living Family/patient expects to be discharged to:: Private residence Living Arrangements: Alone Available Help at Discharge: Personal care attendant;Available 24 hours/day Type of Home: House Home Access: Stairs to enter [employer] of Steps: 1   Home Layout: One level     Bathroom Shower/Tub: Chief Strategy Officer: Standard     Home Equipment: BSC/3in1;Cane - single point;Rollator (4 wheels);Shower seat   Additional Comments: Home info above for caregiver's home where pt will be staying for 9wks during recovery. Caregiver is having an adjustable bed delivered      Prior Functioning/Environment Prior Level of Function : Needs assist             Mobility Comments: ambulating short distances with rollator primarily, PRN SPC; no falls in past 9mo ADLs Comments: Caregiver/aide assists daily with bathing, dressing, cooking, cleaning. Pt indep with med mgt.        OT Problem List: Decreased strength;Decreased coordination;Impaired sensation;Decreased range of motion;Impaired UE functional use;Decreased knowledge of precautions;Impaired balance (sitting and/or standing);Decreased knowledge of use of DME or AE      OT Treatment/Interventions: Self-care/ADL training;Therapeutic exercise;Therapeutic  activities;DME and/or AE instruction;Patient/family education;Balance training;Manual therapy    OT Goals(Current goals can be found in the care plan section) Acute Rehab OT Goals Patient Stated Goal: go home OT Goal Formulation: With patient/family Time For Goal Achievement: 08/16/23 Potential to Achieve Goals: Good ADL Goals Pt Will Perform Upper Body Dressing: with caregiver independent in assisting;sitting (maintaining RUE precautions) Pt Will Perform Lower Body Dressing: with caregiver independent in assisting;sit to/from stand Pt Will Transfer to Toilet: ambulating;with supervision;with contact guard assist Additional ADL Goal #1: Pt/caregiver will independently manage polar care and sling mgt  OT Frequency: Min 1X/week    Co-evaluation              AM-PAC OT "6 Clicks" Daily Activity     Outcome Measure Help from another person eating meals?: A Little Help from another person taking care of personal grooming?: A Little Help from another person toileting, which includes using toliet, bedpan, or urinal?: A Lot Help from another person bathing (including washing, rinsing, drying)?: A Lot Help from another person to put on and taking off regular upper body clothing?: A Lot Help from another person to put on and taking off regular lower body clothing?: A Lot 6 Click Score: 14   End of Session  Activity Tolerance: Patient tolerated treatment well Patient left: in bed;with call bell/phone within reach;with family/visitor present;Other (comment) (EOB with PT)  OT Visit Diagnosis: Other abnormalities of gait and mobility (R26.89)                Time: 1308-6578 OT Time Calculation (min): 34 min Charges:  OT General Charges $OT Visit: 1 Visit OT Evaluation $OT Eval Low Complexity: 1 Low OT Treatments $Self Care/Home Management : 23-37 mins  Arman Filter., MPH, MS, OTR/L ascom [phone] 08/02/23, 4:49 PM

## 2023-08-03 LAB — SURGICAL PATHOLOGY

## 2023-08-03 NOTE — Anesthesia Postprocedure Evaluation (Signed)
Anesthesia Post Note  Patient: Kristine Rangel  Procedure(s) Performed: REVERSE SHOULDER ARTHROPLASTY (Right: Shoulder)  Patient location during evaluation: PACU Anesthesia Type: General Level of consciousness: awake and alert Pain management: pain level controlled Vital Signs Assessment: post-procedure vital signs reviewed and stable Respiratory status: spontaneous breathing, nonlabored ventilation and respiratory function stable Cardiovascular status: blood pressure returned to baseline and stable Postop Assessment: no apparent nausea or vomiting Anesthetic complications: no   There were no known notable events for this encounter.   Last Vitals:  Vitals:   08/02/23 1517 08/02/23 1729  BP: (!) 163/81 (!) 123/55  Pulse: 83 82  Resp: 16 15  Temp: (!) 36.1 C   SpO2: 94% 95%    Last Pain:  Vitals:   08/02/23 1729  TempSrc:   PainSc: 0-No pain                 Foye Deer

## 2023-08-09 ENCOUNTER — Encounter: Payer: Self-pay | Admitting: Surgery

## 2023-08-13 ENCOUNTER — Encounter: Payer: Self-pay | Admitting: Surgery

## 2023-09-03 ENCOUNTER — Other Ambulatory Visit: Payer: Self-pay | Admitting: Internal Medicine

## 2023-09-04 ENCOUNTER — Other Ambulatory Visit: Payer: Self-pay | Admitting: Internal Medicine

## 2023-09-07 ENCOUNTER — Telehealth: Payer: Self-pay

## 2023-09-07 ENCOUNTER — Other Ambulatory Visit: Payer: Self-pay | Admitting: Internal Medicine

## 2023-09-07 NOTE — Telephone Encounter (Signed)
Patient verified that she is  NOT taking Valsartan.  Will reject pharmacy refill request.

## 2023-09-07 NOTE — Telephone Encounter (Signed)
Message left for patient to call to verify if she is taking Valsartan  E-Refill request received from pharmacy for Valsartan 40 mg.  Documented at last visit on 07/27/23 with Dr. Okey Dupre that Valsartan has been discontinued by PCP, Dr. Mayford Knife, due to generalized malaise and soft blood pressure.

## 2023-09-07 NOTE — Telephone Encounter (Signed)
last visit:  07/27/23 with plan to f/u in 12 months.  Next visit: none/active recall

## 2023-09-07 NOTE — Telephone Encounter (Signed)
Phone note created:  Message left for patient to call to verify if she is taking Valsartan   E-Refill request received from pharmacy for Valsartan 40 mg.   Documented at last visit on 07/27/23 with Dr. Okey Dupre that Valsartan has been discontinued by PCP, Dr. Mayford Knife, due to generalized malaise and soft blood pressure.

## 2023-09-07 NOTE — Telephone Encounter (Signed)
Patient returned staff call. 

## 2023-10-01 ENCOUNTER — Ambulatory Visit: Payer: 59

## 2023-10-26 ENCOUNTER — Ambulatory Visit: Payer: 59 | Attending: Cardiology

## 2023-11-27 ENCOUNTER — Other Ambulatory Visit: Payer: Self-pay | Admitting: Family Medicine

## 2023-11-27 DIAGNOSIS — Z1231 Encounter for screening mammogram for malignant neoplasm of breast: Secondary | ICD-10-CM

## 2024-02-18 ENCOUNTER — Other Ambulatory Visit: Payer: Self-pay | Admitting: Surgery

## 2024-02-19 ENCOUNTER — Encounter
Admission: RE | Admit: 2024-02-19 | Discharge: 2024-02-19 | Disposition: A | Discharge status: Home / Self Care | Source: Ambulatory Visit | Attending: Surgery | Admitting: Surgery

## 2024-02-19 ENCOUNTER — Telehealth: Payer: Self-pay | Admitting: *Deleted

## 2024-02-19 ENCOUNTER — Other Ambulatory Visit: Payer: Self-pay

## 2024-02-19 ENCOUNTER — Other Ambulatory Visit: Payer: Self-pay | Admitting: Family Medicine

## 2024-02-19 VITALS — BP 143/93 | HR 66 | Temp 97.4°F | Resp 16 | Ht 63.0 in | Wt 268.0 lb

## 2024-02-19 DIAGNOSIS — Z01818 Encounter for other preprocedural examination: Secondary | ICD-10-CM | POA: Diagnosis present

## 2024-02-19 DIAGNOSIS — Z01812 Encounter for preprocedural laboratory examination: Secondary | ICD-10-CM

## 2024-02-19 DIAGNOSIS — Z780 Asymptomatic menopausal state: Secondary | ICD-10-CM

## 2024-02-19 DIAGNOSIS — Z01810 Encounter for preprocedural cardiovascular examination: Secondary | ICD-10-CM | POA: Diagnosis not present

## 2024-02-19 HISTORY — DX: Essential (primary) hypertension: I10

## 2024-02-19 LAB — URINALYSIS, ROUTINE W REFLEX MICROSCOPIC
Bacteria, UA: NONE SEEN
Bilirubin Urine: NEGATIVE
Glucose, UA: >=500 mg/dL — AB
Hgb urine dipstick: NEGATIVE
Ketones, ur: NEGATIVE mg/dL
Leukocytes,Ua: NEGATIVE
Nitrite: NEGATIVE
Protein, ur: NEGATIVE mg/dL
Specific Gravity, Urine: 1.022 (ref 1.005–1.030)
pH: 5 (ref 5.0–8.0)

## 2024-02-19 LAB — COMPREHENSIVE METABOLIC PANEL WITH GFR
ALT: 13 U/L (ref 0–44)
AST: 23 U/L (ref 15–41)
Albumin: 3.7 g/dL (ref 3.5–5.0)
Alkaline Phosphatase: 102 U/L (ref 38–126)
Anion gap: 9 (ref 5–15)
BUN: 12 mg/dL (ref 8–23)
CO2: 26 mmol/L (ref 22–32)
Calcium: 10 mg/dL (ref 8.9–10.3)
Chloride: 103 mmol/L (ref 98–111)
Creatinine, Ser: 1.08 mg/dL — ABNORMAL HIGH (ref 0.44–1.00)
GFR, Estimated: 55 mL/min — ABNORMAL LOW (ref >60)
Glucose, Bld: 77 mg/dL (ref 70–99)
Potassium: 4.2 mmol/L (ref 3.5–5.1)
Sodium: 138 mmol/L (ref 135–145)
Total Bilirubin: 0.8 mg/dL (ref 0.0–1.2)
Total Protein: 6.9 g/dL (ref 6.5–8.1)

## 2024-02-19 LAB — CBC WITH DIFFERENTIAL/PLATELET
Abs Immature Granulocytes: 0.01 10*3/uL (ref 0.00–0.07)
Basophils Absolute: 0 10*3/uL (ref 0.0–0.1)
Basophils Relative: 1 %
Eosinophils Absolute: 0.3 10*3/uL (ref 0.0–0.5)
Eosinophils Relative: 7 %
HCT: 38.4 % (ref 36.0–46.0)
Hemoglobin: 12.6 g/dL (ref 12.0–15.0)
Immature Granulocytes: 0 %
Lymphocytes Relative: 35 %
Lymphs Abs: 1.6 10*3/uL (ref 0.7–4.0)
MCH: 27.3 pg (ref 26.0–34.0)
MCHC: 32.8 g/dL (ref 30.0–36.0)
MCV: 83.1 fL (ref 80.0–100.0)
Monocytes Absolute: 0.7 10*3/uL (ref 0.1–1.0)
Monocytes Relative: 14 %
Neutro Abs: 2.1 10*3/uL (ref 1.7–7.7)
Neutrophils Relative %: 43 %
Platelets: 359 10*3/uL (ref 150–400)
RBC: 4.62 MIL/uL (ref 3.87–5.11)
RDW: 15.5 % (ref 11.5–15.5)
WBC: 4.7 10*3/uL (ref 4.0–10.5)
nRBC: 0 % (ref 0.0–0.2)

## 2024-02-19 LAB — TYPE AND SCREEN
ABO/RH(D): O POS
Antibody Screen: NEGATIVE

## 2024-02-19 LAB — SURGICAL PCR SCREEN
MRSA, PCR: NEGATIVE
Staphylococcus aureus: POSITIVE — AB

## 2024-02-19 NOTE — Patient Instructions (Addendum)
 Your procedure is scheduled on: Thursday, April 10 Report to the Registration Desk on the 1st floor of the [facility] To find out your arrival time, please call [phone] between 1PM - 3PM on: Wednesday, April 9 If your arrival time is 6:00 am, do not arrive before that time as the [facility] entrance doors do not open until 6:00 am.  REMEMBER: Instructions that are not followed completely may result in serious medical risk, up to and including death; or upon the discretion of your surgeon and anesthesiologist your surgery may need to be rescheduled.  Do not eat food after midnight the night before surgery.  No gum chewing or hard candies.  You may however, drink water up to 2 hours before you are scheduled to arrive for your surgery. Do not drink anything within 2 hours of your scheduled arrival time.  In addition, your doctor has ordered for you to drink the provided:  G-2 gatorade Drinking this carbohydrate drink up to two hours before surgery helps to reduce insulin resistance and improve patient outcomes. Please complete drinking 2 hours before scheduled arrival time.  One week prior to surgery:  Stop aspirin and Anti-inflammatories (NSAIDS) such as Advil, Aleve, Ibuprofen, Motrin, Naproxen, Naprosyn and Aspirin based products such as Excedrin, Goody's Powder, BC Powder. Stop ANY OVER THE COUNTER supplements until after surgery.  You may however, continue to take Tylenol if needed for pain up until the day of surgery.  empagliflozin (JARDIANCE) - hold for 3 days before surgery. Do NOT take any more jardiance until AFTER surgery.  Ozempic - hold for 7 days before surgery. Last day you took was Tuesday, April 1. Resume AFTER surgery on your regular weekly day, Tuesday, April 15.  Continue taking all of your other prescription medications up until the day of surgery.  ON THE DAY OF SURGERY ONLY TAKE THESE MEDICATIONS WITH SIPS OF WATER:  Albuterol  nebulizer Carvedilol Esomeprazole (Nexium) Gabapentin Isosorbide mononitrate Symbicort inhaler  Use inhalers on the day of surgery and bring your albuterol inhaler to the hospital.  No Alcohol for 24 hours before or after surgery.  No Smoking including e-cigarettes for 24 hours before surgery.  No chewable tobacco products for at least 6 hours before surgery.  No nicotine patches on the day of surgery.  Do not use any "recreational" drugs for at least a week (preferably 2 weeks) before your surgery.  Please be advised that the combination of cocaine and anesthesia may have negative outcomes, up to and including death. If you test positive for cocaine, your surgery will be cancelled.  On the morning of surgery brush your teeth with toothpaste and water, you may rinse your mouth with mouthwash if you wish. Do not swallow any toothpaste or mouthwash.  Use CHG Soap as directed on instruction sheet.  Do not wear jewelry, make-up, hairpins, clips or nail polish.  For welded (permanent) jewelry: bracelets, anklets, waist bands, etc.  Please have this removed prior to surgery.  If it is not removed, there is a chance that hospital personnel will need to cut it off on the day of surgery.  Do not wear lotions, powders, or perfumes.   Do not shave body hair from the neck down 48 hours before surgery.  Contact lenses, hearing aids and dentures may not be worn into surgery.  Do not bring valuables to the hospital. [facility] is not responsible for any missing/lost belongings or valuables.   Bring your C-PAP to the hospital in  case you may have to spend the night.   Notify your doctor if there is any change in your medical condition (cold, fever, infection).  Wear comfortable clothing (specific to your surgery type) to the hospital.  After surgery, you can help prevent lung complications by doing breathing exercises.  Take deep breaths and cough every 1-2 hours. Your doctor may order a  device called an Incentive Spirometer to help you take deep breaths.  If you are being admitted to the hospital overnight, leave your suitcase in the car. After surgery it may be brought to your room.  In case of increased patient census, it may be necessary for you, the patient, to continue your postoperative care in the Same Day Surgery department.  If you are being discharged the day of surgery, you will not be allowed to drive home. You will need a responsible individual to drive you home and stay with you for 24 hours after surgery.   If you are taking public transportation, you will need to have a responsible individual with you.  Please call the Pre-admissions Testing Dept. at [phone] if you have any questions about these instructions.  Surgery Visitation Policy:  Patients having surgery or a procedure may have two visitors.  Children under the age of 74 must have an adult with them who is not the patient.  Inpatient Visitation:    Visiting hours are 7 a.m. to 8 p.m. Up to four visitors are allowed at one time in a patient room. The visitors may rotate out with other people during the day.  One visitor age 37 or older may stay with the patient overnight and must be in the room by 8 p.m.       Pre-operative 5 CHG Bath Instructions   You can play a key role in reducing the risk of infection after surgery. Your skin needs to be as free of germs as possible. You can reduce the number of germs on your skin by washing with CHG (chlorhexidine gluconate) soap before surgery. CHG is an antiseptic soap that kills germs and continues to kill germs even after washing.   DO NOT use if you have an allergy to chlorhexidine/CHG or antibacterial soaps. If your skin becomes reddened or irritated, stop using the CHG and notify one of our RNs at [phone].   Please shower with the CHG soap starting 4 days before surgery using the following schedule:     Please keep in mind the  following:  DO NOT shave, including legs and underarms, starting the day of your first shower.   You may shave your face at any point before/day of surgery.  Place clean sheets on your bed the day you start using CHG soap. Use a clean washcloth (not used since being washed) for each shower. DO NOT sleep with pets once you start using the CHG.   CHG Shower Instructions:  If you choose to wash your hair and private area, wash first with your normal shampoo/soap.  After you use shampoo/soap, rinse your hair and body thoroughly to remove shampoo/soap residue.  Turn the water OFF and apply about 3 tablespoons (45 ml) of CHG soap to a CLEAN washcloth.  Apply CHG soap ONLY FROM YOUR NECK DOWN TO YOUR TOES (washing for 3-5 minutes)  DO NOT use CHG soap on face, private areas, open wounds, or sores.  Pay special attention to the area where your surgery is being performed.  If you are having back surgery, having  someone wash your back for you may be helpful. Wait 2 minutes after CHG soap is applied, then you may rinse off the CHG soap.  Pat dry with a clean towel  Put on clean clothes/pajamas   If you choose to wear lotion, please use ONLY the CHG-compatible lotions on the back of this paper.     Additional instructions for the day of surgery: DO NOT APPLY any lotions, deodorants, cologne, or perfumes.   Put on clean/comfortable clothes.  Brush your teeth.  Ask your nurse before applying any prescription medications to the skin.      CHG Compatible Lotions   Aveeno Moisturizing lotion  Cetaphil Moisturizing Cream  Cetaphil Moisturizing Lotion  Clairol Herbal Essence Moisturizing Lotion, Dry Skin  Clairol Herbal Essence Moisturizing Lotion, Extra Dry Skin  Clairol Herbal Essence Moisturizing Lotion, Normal Skin  Curel Age Defying Therapeutic Moisturizing Lotion with Alpha Hydroxy  Curel Extreme Care Body Lotion  Curel Soothing Hands Moisturizing Hand Lotion  Curel Therapeutic  Moisturizing Cream, Fragrance-Free  Curel Therapeutic Moisturizing Lotion, Fragrance-Free  Curel Therapeutic Moisturizing Lotion, Original Formula  Eucerin Daily Replenishing Lotion  Eucerin Dry Skin Therapy Plus Alpha Hydroxy Crme  Eucerin Dry Skin Therapy Plus Alpha Hydroxy Lotion  Eucerin Original Crme  Eucerin Original Lotion  Eucerin Plus Crme Eucerin Plus Lotion  Eucerin TriLipid Replenishing Lotion  Keri Anti-Bacterial Hand Lotion  Keri Deep Conditioning Original Lotion Dry Skin Formula Softly Scented  Keri Deep Conditioning Original Lotion, Fragrance Free Sensitive Skin Formula  Keri Lotion Fast Absorbing Fragrance Free Sensitive Skin Formula  Keri Lotion Fast Absorbing Softly Scented Dry Skin Formula  Keri Original Lotion  Keri Skin Renewal Lotion Keri Silky Smooth Lotion  Keri Silky Smooth Sensitive Skin Lotion  Nivea Body Creamy Conditioning Oil  Nivea Body Extra Enriched Lotion  Nivea Body Original Lotion  Nivea Body Sheer Moisturizing Lotion Nivea Crme  Nivea Skin Firming Lotion  NutraDerm 30 Skin Lotion  NutraDerm Skin Lotion  NutraDerm Therapeutic Skin Cream  NutraDerm Therapeutic Skin Lotion  ProShield Protective Hand Cream  Provon moisturizing lotion      Preoperative Educational Videos for Total Hip, Knee and Shoulder Replacements  To better prepare for surgery, please view our videos that explain the physical activity and discharge planning required to have the best surgical recovery at [facility].  [email or web address]  Questions? Call [phone] or email [email or web address]

## 2024-02-19 NOTE — Telephone Encounter (Signed)
   Name: Kristine Rangel  DOB: 02/26/1956  MRN: [id number]  Primary Cardiologist: Yvonne Kendall, MD   Preoperative team, please contact this patient and set up a phone call appointment for further preoperative risk assessment. Please obtain consent and complete medication review. Thank you for your help.  I confirm that guidance regarding antiplatelet and oral anticoagulation therapy has been completed and, if necessary, noted below.  Patient to continue daily low-dose aspirin throughout the perioperative course as noted on preoperative cardiac clearance form  I also confirmed the patient resides in the state of [place]. As per [facility] Medical Board telemedicine laws, the patient must reside in the state in which the provider is licensed.   Denyce Robert, NP 02/19/2024, 2:58 PM [facility]

## 2024-02-19 NOTE — Telephone Encounter (Signed)
 Left message to call back to schedule tele preop apt. Pt would need to be added on tomorrow as surgery planned for 02/21/24. Our office just received this request today.

## 2024-02-19 NOTE — Telephone Encounter (Signed)
-----   Message from Verlee Monte sent at 02/19/2024 11:01 AM EDT ----- Regarding: Request for pre-operative cardiac clearance Request for pre-operative cardiac clearance:  1. What type of surgery is being performed?  ARTHROPLASTY, HIP, TOTAL,POSTERIOR APPROACH  2. When is this surgery scheduled?  02/21/2024. Just received surgery request today. Unsure as to the rationale and rapidity of scheduling. Of note, patient was cleared for reverse shoulder in 07/2023.  3. Type of clearance being requested (medical, pharmacy, both)? MEDICAL   4. Are there any medications that need to be held prior to surgery? N/A - patient to continue daily LOW DOSE ASPIRIN throughout the perioperative course  5. Practice name and name of physician performing surgery?  Performing surgeon: Dr. Leron Croak, MD Requesting clearance: Quentin Mulling, FNP-C    6. Anesthesia type (none, local, MAC, general)? GENERAL  7. What is the office phone and fax number?   Phone: [phone] Fax: [phone]  ATTENTION: Unable to create telephone message as per your standard workflow. Directed by [facility] providers to send requests for cardiac clearance to this pool for appropriate distribution to provider covering pre-operative clearances.   Quentin Mulling, MSN, APRN, FNP-C, CEN [facility]  Peri-operative Services Nurse Practitioner Phone: [phone] 02/19/24 11:01 AM

## 2024-02-19 NOTE — Telephone Encounter (Signed)
   Pre-operative Risk Assessment    Patient Name: Kristine Rangel  DOB: Mar 13, 1956 MRN: [id number]   Date of last office visit: 07/27/23 DR. END Date of next office visit: NONE   Request for Surgical Clearance    Procedure:   ARTHROPLASTY, HIP, TOTAL, POSTERIOR APPROACH  Date of Surgery:  Clearance 02/21/24                                Surgeon:  DR. Joice Lofts Surgeon's Group or Practice Name:  [facility]  Phone number:  [phone] Fax number:  [phone] Quentin Mulling, FNP   Type of Clearance Requested:   - Medical ; PER FORM NO NEED TO HOLD ASA   Type of Anesthesia:  General    Additional requests/questions:    Elpidio Anis   02/19/2024, 2:37 PM

## 2024-02-20 ENCOUNTER — Ambulatory Visit: Attending: Cardiovascular Disease | Admitting: Emergency Medicine

## 2024-02-20 ENCOUNTER — Telehealth: Payer: Self-pay | Admitting: *Deleted

## 2024-02-20 DIAGNOSIS — Z01810 Encounter for preprocedural cardiovascular examination: Secondary | ICD-10-CM

## 2024-02-20 MED ORDER — ORAL CARE MOUTH RINSE: 15.0000 mL | Freq: Once | OROMUCOSAL | Status: AC

## 2024-02-20 MED ORDER — TRANEXAMIC ACID-NACL 1000-0.7 MG/100ML-% IV SOLN
1000.0000 mg | INTRAVENOUS | Status: AC
  Administered 2024-02-21: 1000 mg via INTRAVENOUS

## 2024-02-20 MED ORDER — CEFAZOLIN SODIUM-DEXTROSE 2-4 GM/100ML-% IV SOLN
2.0000 g | INTRAVENOUS | Status: AC
  Administered 2024-02-21: 1 g via INTRAVENOUS
  Administered 2024-02-21: 2 g via INTRAVENOUS

## 2024-02-20 MED ORDER — CHLORHEXIDINE GLUCONATE 0.12 % MT SOLN
15.0000 mL | Freq: Once | OROMUCOSAL | Status: AC
  Administered 2024-02-21: 15 mL via OROMUCOSAL

## 2024-02-20 MED ORDER — SODIUM CHLORIDE 0.9 % IV SOLN: INTRAVENOUS | Status: DC

## 2024-02-20 NOTE — Progress Notes (Signed)
 Virtual Visit via Telephone Note   Because of Kristine Rangel co-morbid illnesses, she is at least at moderate risk for complications without adequate follow up.  This format is felt to be most appropriate for this patient at this time.  Due to technical limitations with video connection Web designer), today's appointment will be conducted as an audio only telehealth visit, and Kristine Rangel verbally agreed to proceed in this manner.   All issues noted in this document were discussed and addressed.  No physical exam could be performed with this format.  Evaluation Performed:  Preoperative cardiovascular risk assessment _____________   Date:  02/20/2024   Patient ID:  Kristine Rangel, DOB 1956/07/03, MRN [id number] Patient Location:  Home Provider location:   Office  Primary Care Provider:  Leanna Sato, MD Primary Cardiologist:  Yvonne Kendall, MD  Chief Complaint / Patient Profile   68 y.o. y/o female with a h/o chronic HFpEF, hypertension, hyperlipidemia, type 2 diabetes, obstructive sleep apnea, cerebral aneurysm status post coiling, TIA, COPD who is pending total hip arthroplasty, posterior approach with [facility] by Dr. Joice Lofts on 02/21/2024 and presents today for telephonic preoperative cardiovascular risk assessment.  History of Present Illness    Kristine Rangel is a 68 y.o. female who presents via audio/video conferencing for a telehealth visit today.  Pt was last seen in cardiology clinic on 07/27/2023 by Dr. Okey Dupre.  At that time Kristine Rangel was doing well.  The patient is now pending procedure as outlined above. Since her last visit, she  denies chest pain, shortness of breath, lower extremity edema, fatigue, palpitations, melena, hematuria, hemoptysis, diaphoresis, weakness, presyncope, syncope, orthopnea, and PND.  Today patient is doing well overall.  She denies any cardiovascular concerns or complaints since the last time she was in office.  She notes that her activity level is  limited due to chronic pain from her shoulder, hip, and back.  She notes that she does have a home health nurse that comes out and helps her with more difficult task.  She does go to PT 3-4 times a week and is able to workout without any anginal symptoms.  Her activity level is limited however she is able to complete >4 METS.  She denies any exertional angina.  She had shoulder surgery on 07/2023 without complications.  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   Bladder leak    Cardiac murmur    Chest pain    a.) 10/07/2008 Neg [place] Echo; b.) LHC 09/11/2011 - minor lum irregs; no sig CAD; c.) MV 06/02/2015: no ischemia; d.) cCTA 02/2021: Ca2+ = 0. Normal coronaries   CKD (chronic kidney disease), stage III (HCC)    COPD (chronic obstructive pulmonary disease) (HCC)    Depression    Diet-controlled type 2 diabetes mellitus (HCC)    Dyspnea    Essential hypertension    GERD (gastroesophageal reflux disease)    Glaucoma  History of kidney stones    History of left heart catheterization (LHC) 09/11/2011   a.) LHC 09/11/2011: minor luminal irregulaties mLAD, mLCx, mRCA - med mgmt.   Hyperlipidemia    Insomnia    a.) takes suvorexant   Long term current use of aspirin    MI (myocardial infarction) (HCC)    a.) in the 1990s; details unclear   Migraine with aura    Neuropathy    OSA on CPAP    Rotator cuff injury    Seasonal allergies    Tendinitis of right rotator cuff    TIA  (transient ischemic attack)    Past Surgical History:  Procedure Laterality Date   BRAIN SURGERY Right 2010   anuerysm repair   CARPAL TUNNEL RELEASE Right 06/12/2017   Procedure: CARPAL TUNNEL RELEASE;  Surgeon: Juanell Fairly, MD;  Location: [facility];  Service: Orthopedics;  Laterality: Right;   CHOLECYSTECTOMY     COLONOSCOPY WITH PROPOFOL N/A 08/09/2021   Procedure: COLONOSCOPY WITH PROPOFOL;  Surgeon: Regis Bill, MD;  Location: [facility];  Service: Endoscopy;  Laterality: N/A;   ESOPHAGOGASTRODUODENOSCOPY (EGD) WITH PROPOFOL N/A 08/09/2021   Procedure: ESOPHAGOGASTRODUODENOSCOPY (EGD) WITH PROPOFOL;  Surgeon: Regis Bill, MD;  Location: [facility];  Service: Endoscopy;  Laterality: N/A;   LEFT HEART CATH AND CORONARY ANGIOGRAPHY Left 09/11/2011   Procedure: LEFT HEART CATH AND CORONARY ANGIOGRAPHY; Location: [facility]; Surgeon: Adrian Blackwater, MD   REVERSE SHOULDER ARTHROPLASTY Right 08/02/2023   Procedure: REVERSE SHOULDER ARTHROPLASTY;  Surgeon: Christena Flake, MD;  Location: [facility];  Service: Orthopedics;  Laterality: Right;   SHOULDER ARTHROSCOPY WITH OPEN ROTATOR CUFF REPAIR Left 11/23/2016   Procedure: SHOULDER ARTHROSCOPY WITH OPEN ROTATOR CUFF REPAIR, ARTHROSCOPIC SUBACROMIAL DECOMPRESSION, DISTAL CLAVICLE EXCISION;  Surgeon: Juanell Fairly, MD;  Location: [facility];  Service: Orthopedics;  Laterality: Left;   SHOULDER ARTHROSCOPY WITH OPEN ROTATOR CUFF REPAIR Right 07/30/2018   Procedure: SHOULDER ARTHROSCOPY WITH OPEN ROTATOR CUFF REPAIR,SUBACROMINAL DECOMPRESSION AND DISTAL CLAVICLE EXCISION;  Surgeon: Juanell Fairly, MD;  Location: [facility];  Service: Orthopedics;  Laterality: Right;   TONSILLECTOMY     TOTAL KNEE ARTHROPLASTY Bilateral    TOTAL KNEE REVISION Right 05/05/2020   Procedure: right total knee arthroplasty revision;  Surgeon: Lyndle Herrlich, MD;  Location: [facility];  Service: Orthopedics;  Laterality: Right;    Allergies  Allergies   Allergen Reactions   Bee Venom Anaphylaxis   Penicillins Hives, Swelling and Other (See Comments)    Has patient had a PCN reaction causing immediate rash, facial/tongue/throat swelling, SOB or lightheadedness with hypotension: Yes Has patient had a PCN reaction causing severe rash involving mucus membranes or skin necrosis: No Has patient had a PCN reaction that required hospitalization No Has patient had a PCN reaction occurring within the last 10 years: No If all of the above answers are "NO", then may proceed with Cephalosporin use.    Home Medications    Prior to Admission medications   Medication Sig Start Date End Date Taking? Authorizing Provider  albuterol (PROVENTIL) (2.5 MG/3ML) 0.083% nebulizer solution Take 2.5 mg by nebulization every 6 (six) hours as needed for wheezing or shortness of breath.    [provider]  albuterol (VENTOLIN HFA) 108 (90 Base) MCG/ACT inhaler Inhale into the lungs every 6 (six) hours as needed for wheezing or shortness of breath.    [provider]  aspirin EC 81 MG tablet Take 81 mg by mouth daily. Swallow whole.    [provider]  atorvastatin (LIPITOR) 80 MG tablet Take 1 tablet (80 mg total) by mouth at bedtime. 08/02/23 02/20/24  Poggi, Excell Seltzer, MD  brimonidine-timolol (COMBIGAN) 0.2-0.5 % ophthalmic solution Place 1 drop into both eyes at bedtime.    [provider]  carvedilol (COREG) 6.25 MG tablet TAKE 1 TABLET BY MOUTH TWICE A DAY 09/04/23   End, Cristal Deer, MD  cetirizine (ZYRTEC) 10 MG tablet Take 10 mg by mouth daily.    [provider]  DULoxetine (CYMBALTA) 60 MG capsule Take 60 mg by mouth at bedtime.  03/13/16   [provider]  empagliflozin (JARDIANCE) 25 MG TABS tablet Take 25 mg by mouth daily.    [provider]  EPINEPHrine 0.3 mg/0.3 mL IJ SOAJ injection Inject 0.3 mg into the skin as needed for anaphylaxis.     [provider]  esomeprazole (NEXIUM) 40 MG  capsule Take 40 mg by mouth 2 (two) times daily before a meal. 03/17/16   [provider]  fluticasone (FLONASE) 50 MCG/ACT nasal spray Place 1 spray into both nostrils daily as needed.    [provider]  furosemide (LASIX) 40 MG tablet Take 40 mg by mouth 2 (two) times daily.     [provider]  gabapentin (NEURONTIN) 300 MG capsule Take 300 mg by mouth 3 (three) times daily.    [provider]  isosorbide mononitrate (IMDUR) 60 MG 24 hr tablet TAKE 1.5 TABLETS (90 MG TOTAL) BY MOUTH DAILY 09/03/23   End, Cristal Deer, MD  lidocaine (LIDODERM) 5 % Place 2 patches onto the skin daily. 02/27/23   [provider]  mirabegron ER (MYRBETRIQ) 50 MG TB24 tablet Take 50 mg by mouth daily.    [provider]  montelukast (SINGULAIR) 10 MG tablet Take 10 mg by mouth at bedtime.    [provider]  Semaglutide,0.25 or 0.5MG /DOS, (OZEMPIC, 0.25 OR 0.5 MG/DOSE,) 2 MG/3ML SOPN Inject 0.25 mg into the skin once a week. Tuesday    [provider]  Suvorexant (BELSOMRA) 20 MG TABS Take 1 tablet by mouth at bedtime.    [provider]  SYMBICORT 160-4.5 MCG/ACT inhaler Inhale 2 puffs into the lungs daily. 03/13/16   [provider]  tizanidine (ZANAFLEX) 2 MG capsule Take 2 mg by mouth every 6 (six) hours as needed for muscle spasms.    [provider]  topiramate (TOPAMAX) 50 MG tablet Take 50 mg by mouth at bedtime.    [provider]  traMADol (ULTRAM) 50 MG tablet Take 50 mg by mouth 2 (two) times daily. 01/23/24   [provider]  valsartan (DIOVAN) 40 MG tablet Take 20 mg by mouth daily.    [provider]    Physical Exam    Vital Signs:  Kristine Rangel does not have vital signs available for review today.  Given telephonic nature of communication, physical exam is limited. AAOx3. NAD. Normal affect.  Speech and respirations are unlabored.  Accessory Clinical Findings     None  Assessment & Plan    1.  Preoperative Cardiovascular Risk Assessment: According to the Revised Cardiac Risk Index (RCRI), her Perioperative Risk of Major Cardiac Event is (%): 0.9. Her Functional Capacity in METs is: 4.4 according to the [place] Activity Status Index (DASI). Therefore, based on ACC/AHA guidelines, patient would be at acceptable risk for the planned procedure without further cardiovascular testing.   The patient was advised that if she develops new symptoms prior to surgery  to contact our office to arrange for a follow-up visit, and she verbalized understanding.  Patient to continue daily low-dose aspirin throughout the perioperative course as noted on preoperative cardiac clearance form.  A copy of this note will be routed to requesting surgeon.  Time:   Today, I have spent 8 minutes with the patient with telehealth technology discussing medical history, symptoms, and management plan.     Denyce Robert, NP  02/20/2024, 8:27 AM

## 2024-02-20 NOTE — Progress Notes (Addendum)
 Perioperative / Anesthesia Services  Pre-Admission Testing Clinical Review / Pre-Operative Anesthesia Consult  Date: 02/20/24  Patient Demographics:  Name: Kristine Rangel DOB:   [birth year] MRN:   [id number]  Planned Surgical Procedure(s):    Case: [id number] Date/Time: 02/21/24 1005   Procedure: ARTHROPLASTY, HIP, TOTAL,POSTERIOR APPROACH (Right: Hip)   Anesthesia type: Choice   Diagnosis: Primary osteoarthritis of right hip [M16.11]   Pre-op diagnosis: Primary osteoarthritis of right hip M16.11   Location: [facility] OR ROOM 02 / [facility]   Surgeons: Christena Flake, MD     NOTE: Available PAT nursing documentation and vital signs have been reviewed. Clinical nursing staff has updated patient's PMH/PSHx, current medication list, and drug allergies/intolerances to ensure comprehensive history available to assist in medical decision making as it pertains to the aforementioned surgical procedure and anticipated anesthetic course. Extensive review of available clinical information personally performed. [place] [facility] and PSHx updated with any diagnoses/procedures that  may have been inadvertently omitted during her intake with the pre-admission testing department's nursing staff.  Clinical Discussion:  Kristine Rangel is a 68 y.o. female who is submitted for pre-surgical anesthesia review and clearance prior to her undergoing the above procedure. Patient is a Former Games developer. Pertinent PMH includes: MI, HFpEF, basilar artery aneurysm (s/p coiling), TIA, angina, cardiac murmur, HTN, HLD, T2DM, CKD-III, COPD, asthma, OSAH (requires nocturnal PAP therapy), GERD (on daily PPI), nephrolithiasis, glaucoma, OA, rotator cuff arthropathy, anxiety, depression, insomnia.  Patient is followed by cardiology (End, MD). She was last seen in the cardiology clinic on 07/27/2023; notes reviewed. At the time of her clinic visit, patient doing well overall from a cardiovascular perspective. Patient  denied any chest pain, shortness of breath, PND, orthopnea, palpitations, significant peripheral edema, weakness, fatigue, vertiginous symptoms, or presyncope/syncope. Patient with a past medical history significant for cardiovascular diagnoses. Documented physical exam was grossly benign, providing no evidence of acute exacerbation and/or decompensation of the patient's known cardiovascular conditions.  Patient with a history of an intracranial artery aneurysm.  She underwent coil embolization of the mid basilar artery on 04/05/2009.  Post coiling, there was a filling defect noted just distal to the coils consistent with intraluminal thrombus.  Patient was treated with Reopro bolus followed by a 12-hour drip.  Subsequent imaging revealed resolution and no evidence of other concerning findings.   Patient underwent diagnostic LEFT heart catheterization on 09/11/2011.  Study revealed minor luminal irregularities within the mid LAD, mid LCx, and mid RCA.  There was no significant coronary artery disease noted.  Recommendations were for medical management.  Myocardial perfusion imaging study was performed on 06/02/2015 revealing a normal left trickle systolic function with an EF of 54%.  There was no evidence of stress-induced myocardial ischemia or arrhythmia; no scintigraphic evidence of scar.  Study determined to be normal and low risk. Coronary CTA was performed on 02/24/2021 revealing a normal coronary calcium score (0).  Study demonstrated normal coronary origin with RIGHT-sided dominance.  There were no significant extracardiac findings.  Most recent TTE was performed on 05/17/2022 revealing normal left ventricular systolic function with an EF of 60-65%.  There were no regional wall motion abnormalities. Left ventricular diastolic Doppler parameters consistent with abnormal relaxation (G1DD).  Right ventricular size and function normal.  There was no valvular regurgitation. All transvalvular gradients  were noted to be normal providing no evidence suggestive of valvular stenosis. Aorta normal in size with no evidence of aneurysmal dilatation.  Blood pressure well controlled at  126/78 mmHg on currently prescribed beta-blocker (carvedilol, diuretic (furosemide), and nitrate (isosorbide mononitrate) therapies. Patient is on atorvastatin for her HLD diagnosis and ASCVD prevention.  T2DM being controlled with diet and lifestyle modification alone; no recent A1c for review (unable to add to preoperative labs).  In the setting of known cardiovascular disease and concurrent T2DM, patient is on an SGLT2i (empagliflozin) for added cardiovascular and renovascular protection.  Functional capacity somewhat limited by patient's age, obesity, and multiple medical comorbidities.  She is able to ambulate with a rolling walker.  Patient able to complete all of her ADLs/ADLs without significant cardiovascular limitation.  Per the DASI, patient is able to achieve at least 4 METS of physical activity without experiencing any significant degree of angina/anginal equivalent symptoms.  No changes were made to her medication regimen.  Patient to follow-up with outpatient cardiology in 1 year or sooner if needed.  [name] is scheduled for an elective ARTHROPLASTY, HIP, TOTAL,POSTERIOR APPROACH (Right: Hip) on 08/02/2023 with Dr. Leron Croak, MD.  Given patient's past medical history significant for cardiovascular diagnoses, presurgical cardiac clearance was sought by the PAT team.  Per cardiology, "according to the Revised Cardiac Risk Index (RCRI), her Perioperative Risk of Major Cardiac Event is (%): 0.9. Her Functional Capacity in METs is: 4.4 according to the [place] Activity Status Index (DASI). Therefore, based on ACC/AHA guidelines, patient would be at ACCEPTABLE risk for the planned procedure without further cardiovascular testing".  In review of her medication reconciliation, it is noted that patient is currently on  prescribed daily antithrombotic therapy. Given that patient's past medical history is significant for cardiovascular diagnoses, including but not limited to CAD, orthopedics has cleared patient to continue her daily low dose ASA throughout her perioperative course. She will be asked to hold her normal dose on the day of her procedure only. Patient has been updated on these directives from her specialty care providers by the PAT team.  Patient denies previous perioperative complications with anesthesia in the past. In review of the available records, it is noted that patient underwent a general anesthetic course here at [facility] ([facility]) in 07/2023 without documented complications.      02/19/2024   10:36 AM 08/02/2023    5:29 PM 08/02/2023    3:17 PM  Vitals with BMI  Height 5\' 3"     Weight 268 lbs    BMI 47.49    Systolic 143 123 604  Diastolic 93 55 81  Pulse 66 82 83    Providers/Specialists:   NOTE: Primary physician provider listed below. Patient may have been seen by APP or partner within same practice.   PROVIDER ROLE / SPECIALTY LAST OV  Poggi, Excell Seltzer, MD Orthopedics (Surgeon) 01/18/2024  Leanna Sato, MD Primary Care Provider ???  End, Cristal Deer, MD Cardiology 07/27/2023  Cristopher Peru, MD Neurology 03/12/2023   Allergies:  Bee venom and Penicillins  Current Home Medications:   No current facility-administered medications for this encounter.    albuterol (PROVENTIL) (2.5 MG/3ML) 0.083% nebulizer solution   albuterol (VENTOLIN HFA) 108 (90 Base) MCG/ACT inhaler   aspirin EC 81 MG tablet   atorvastatin (LIPITOR) 80 MG tablet   brimonidine-timolol (COMBIGAN) 0.2-0.5 % ophthalmic solution   carvedilol (COREG) 6.25 MG tablet   cetirizine (ZYRTEC) 10 MG tablet   DULoxetine (CYMBALTA) 60 MG capsule   empagliflozin (JARDIANCE) 25 MG TABS tablet   EPINEPHrine 0.3 mg/0.3 mL IJ SOAJ injection   esomeprazole (NEXIUM) 40  MG capsule    fluticasone (FLONASE) 50 MCG/ACT nasal spray   furosemide (LASIX) 40 MG tablet   gabapentin (NEURONTIN) 300 MG capsule   isosorbide mononitrate (IMDUR) 60 MG 24 hr tablet   lidocaine (LIDODERM) 5 %   mirabegron ER (MYRBETRIQ) 50 MG TB24 tablet   montelukast (SINGULAIR) 10 MG tablet   Semaglutide,0.25 or 0.5MG /DOS, (OZEMPIC, 0.25 OR 0.5 MG/DOSE,) 2 MG/3ML SOPN   Suvorexant (BELSOMRA) 20 MG TABS   SYMBICORT 160-4.5 MCG/ACT inhaler   tizanidine (ZANAFLEX) 2 MG capsule   topiramate (TOPAMAX) 50 MG tablet   traMADol (ULTRAM) 50 MG tablet   valsartan (DIOVAN) 40 MG tablet   History:   Past Medical History:  Diagnosis Date   (HFpEF) heart failure with preserved ejection fraction (HCC)    a.) TTE 03/03/2021: EF 60-65%, no RWMAs, mild LA dil, RVSP 32.6, G2DD; b.) TTE 05/17/2022: EF 60-65%, no RWMAs, normal RVSF, G1DD   Anxiety    Arthritis    Asthma    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   Bladder leak    Cardiac murmur    Chest pain    a.) 10/07/2008 Neg [place] Echo; b.) LHC 09/11/2011 - minor lum irregs; no sig CAD; c.) MV 06/02/2015: no ischemia; d.) cCTA 02/2021: Ca2+ = 0. Normal coronaries   CKD (chronic kidney disease), stage III (HCC)    COPD (chronic obstructive pulmonary disease) (HCC)    Depression    Diet-controlled type 2 diabetes mellitus (HCC)    Dyspnea    Essential hypertension    GERD (gastroesophageal reflux disease)    Glaucoma    History of kidney stones    History of left heart catheterization (LHC) 09/11/2011   a.) LHC 09/11/2011: minor luminal irregulaties mLAD, mLCx, mRCA - med mgmt.   Hyperlipidemia    Insomnia    a.) takes suvorexant   Long term current use of aspirin    MI (myocardial infarction) (HCC)    a.) in the 1990s; details  unclear   Migraine with aura    Neuropathy    OSA on CPAP    Rotator cuff injury    Seasonal allergies    Tendinitis of right rotator cuff    TIA (transient ischemic attack)    Past Surgical History:  Procedure Laterality Date   BRAIN SURGERY Right 2010   anuerysm repair   CARPAL TUNNEL RELEASE Right 06/12/2017   Procedure: CARPAL TUNNEL RELEASE;  Surgeon: Juanell Fairly, MD;  Location: [facility] ORS;  Service: Orthopedics;  Laterality: Right;   CHOLECYSTECTOMY     COLONOSCOPY WITH PROPOFOL N/A 08/09/2021   Procedure: COLONOSCOPY WITH PROPOFOL;  Surgeon: Regis Bill, MD;  Location: [facility] END[facility];  Service: Endoscopy;  Laterality: N/A;   ESOPHAGOGASTRODUODENOSCOPY (EGD) WITH PROPOFOL N/A 08/09/2021   Procedure: ESOPHAGOGASTRODUODENOSCOPY (EGD) WITH PROPOFOL;  Surgeon: Regis Bill, MD;  Location: [facility] ENDOSCOPY;  Service: Endoscopy;  Laterality: N/A;   LEFT HEART CATH AND CORONARY ANGIOGRAPHY Left 09/11/2011   Procedure: LEFT HEART CATH AND CORONARY ANGIOGRAPHY; Location: [facility]; Surgeon: Adrian Blackwater, MD   REVERSE SHOULDER ARTHROPLASTY Right 08/02/2023   Procedure: REVERSE SHOULDER ARTHROPLASTY;  Surgeon: Christena Flake, MD;  Location: [facility] ORS;  Service: Orthopedics;  Laterality: Right;   SHOULDER ARTHROSCOPY WITH OPEN ROTATOR CUFF REPAIR Left 11/23/2016   Procedure: SHOULDER ARTHROSCOPY  WITH OPEN ROTATOR CUFF REPAIR, ARTHROSCOPIC SUBACROMIAL DECOMPRESSION, DISTAL CLAVICLE EXCISION;  Surgeon: Juanell Fairly, MD;  Location: [facility] ORS;  Service: Orthopedics;  Laterality: Left;   SHOULDER ARTHROSCOPY WITH OPEN ROTATOR CUFF REPAIR Right 07/30/2018   Procedure: SHOULDER ARTHROSCOPY WITH OPEN ROTATOR CUFF REPAIR,SUBACROMINAL DECOMPRESSION AND DISTAL CLAVICLE EXCISION;  Surgeon: Juanell Fairly, MD;  Location: [facility] ORS;  Service: Orthopedics;  Laterality: Right;   TONSILLECTOMY     TOTAL KNEE ARTHROPLASTY Bilateral    TOTAL KNEE REVISION Right 05/05/2020   Procedure: right total  knee arthroplasty revision;  Surgeon: Lyndle Herrlich, MD;  Location: [facility] ORS;  Service: Orthopedics;  Laterality: Right;   Family History  Problem Relation Age of Onset   Breast cancer Maternal Aunt    Diabetes Mother    Heart attack Paternal Grandfather    Stroke Paternal Grandfather    Social History   Tobacco Use   Smoking status: Former    Types: Cigarettes   Smokeless tobacco: Current    Types: Snuff  Vaping Use   Vaping status: Never Used  Substance Use Topics   Alcohol use: Yes    Comment: rarely wine   Drug use: No    Pertinent Clinical Results:  LABS:   Hospital Outpatient Visit on 02/19/2024  Component Date Value Ref Range Status   MRSA, PCR 02/19/2024 NEGATIVE  NEGATIVE Final   Staphylococcus aureus 02/19/2024 POSITIVE (A)  NEGATIVE Final   Comment: (NOTE) The Xpert SA Assay (FDA approved for NASAL specimens in patients 63 years of age and older), is one component of a comprehensive surveillance program. It is not intended to diagnose infection nor to guide or monitor treatment. Performed at [facility], [address] [place], [place] [address]    WBC 02/19/2024 4.7  4.0 - 10.5 K/uL Final   RBC 02/19/2024 4.62  3.87 - 5.11 MIL/uL Final   Hemoglobin 02/19/2024 12.6  12.0 - 15.0 g/dL Final   HCT 62/13/0865 38.4  36.0 - 46.0 % Final   MCV 02/19/2024 83.1  80.0 - 100.0 fL Final   MCH 02/19/2024 27.3  26.0 - 34.0 pg Final   MCHC 02/19/2024 32.8  30.0 - 36.0 g/dL Final   RDW 78/46/9629 15.5  11.5 - 15.5 % Final   Platelets 02/19/2024 359  150 - 400 K/uL Final   nRBC 02/19/2024 0.0  0.0 - 0.2 % Final   Neutrophils Relative % 02/19/2024 43  % Final   Neutro Abs 02/19/2024 2.1  1.7 - 7.7 K/uL Final   Lymphocytes Relative 02/19/2024 35  % Final   Lymphs Abs 02/19/2024 1.6  0.7 - 4.0 K/uL Final   Monocytes Relative 02/19/2024 14  % Final   Monocytes Absolute 02/19/2024 0.7  0.1 - 1.0 K/uL Final   Eosinophils Relative 02/19/2024 7  % Final    Eosinophils Absolute 02/19/2024 0.3  0.0 - 0.5 K/uL Final   Basophils Relative 02/19/2024 1  % Final   Basophils Absolute 02/19/2024 0.0  0.0 - 0.1 K/uL Final   Immature Granulocytes 02/19/2024 0  % Final   Abs Immature Granulocytes 02/19/2024 0.01  0.00 - 0.07 K/uL Final   Performed at [facility], [address] [place], [place] [address]   Sodium 02/19/2024 138  135 - 145 mmol/L Final   Potassium 02/19/2024 4.2  3.5 - 5.1 mmol/L Final   Chloride 02/19/2024 103  98 - 111 mmol/L Final   CO2 02/19/2024 26  22 - 32 mmol/L Final   Glucose, Bld  02/19/2024 77  70 - 99 mg/dL Final   Glucose reference range applies only to samples taken after fasting for at least 8 hours.   BUN 02/19/2024 12  8 - 23 mg/dL Final   Creatinine, Ser 02/19/2024 1.08 (H)  0.44 - 1.00 mg/dL Final   Calcium 32/44/0102 10.0  8.9 - 10.3 mg/dL Final   Total Protein 72/53/6644 6.9  6.5 - 8.1 g/dL Final   Albumin 03/47/4259 3.7  3.5 - 5.0 g/dL Final   AST 56/38/7564 23  15 - 41 U/L Final   ALT 02/19/2024 13  0 - 44 U/L Final   Alkaline Phosphatase 02/19/2024 102  38 - 126 U/L Final   Total Bilirubin 02/19/2024 0.8  0.0 - 1.2 mg/dL Final   GFR, Estimated 02/19/2024 55 (L)  >60 mL/min Final   Comment: (NOTE) Calculated using the CKD-EPI Creatinine Equation (2021)    Anion gap 02/19/2024 9  5 - 15 Final   Performed at [facility], [address] [place], [place] [address]   Color, Urine 02/19/2024 YELLOW (A)  YELLOW Final   APPearance 02/19/2024 CLEAR (A)  CLEAR Final   Specific Gravity, Urine 02/19/2024 1.022  1.005 - 1.030 Final   pH 02/19/2024 5.0  5.0 - 8.0 Final   Glucose, UA 02/19/2024 >=500 (A)  NEGATIVE mg/dL Final   Hgb urine dipstick 02/19/2024 NEGATIVE  NEGATIVE Final   Bilirubin Urine 02/19/2024 NEGATIVE  NEGATIVE Final   Ketones, ur 02/19/2024 NEGATIVE  NEGATIVE mg/dL Final   Protein, ur 18/84/1660 NEGATIVE  NEGATIVE mg/dL Final   Nitrite 63/11/6008 NEGATIVE  NEGATIVE Final    Leukocytes,Ua 02/19/2024 NEGATIVE  NEGATIVE Final   RBC / HPF 02/19/2024 0-5  0 - 5 RBC/hpf Final   WBC, UA 02/19/2024 0-5  0 - 5 WBC/hpf Final   Bacteria, UA 02/19/2024 NONE SEEN  NONE SEEN Final   Squamous Epithelial / HPF 02/19/2024 0-5  0 - 5 /HPF Final   Mucus 02/19/2024 PRESENT   Final   Performed at [facility], [address] [place], [place] [address]   ABO/RH(D) 02/19/2024 O POS   Final   Antibody Screen 02/19/2024 NEG   Final   Sample Expiration 02/19/2024 03/04/2024,2359   Final   Extend sample reason 02/19/2024    Final                   Value:NO TRANSFUSIONS OR PREGNANCY IN THE PAST 3 MONTHS Performed at [facility], [address] [place], [place] [address]     ECG: Date: 02/19/2024 Time ECG obtained: 1100 AM Rate: 66 bpm Rhythm: normal sinus Axis (leads I and aVF): Normal Intervals: PR 158 ms. QRS 72 ms. QTc 404 ms. ST segment and T wave changes: No evidence of acute ST segment elevation or depression.   Comparison: Similar to previous tracing obtained on 07/27/2023   IMAGING / PROCEDURES: DIAGNOSTIC RADIOGRAPHS OF RIGHT HIP 2-3 VIEWS performed on 10/30/2023 Moderate osteoarthritic changes involving the right hip.   The patient does have loss of joint space along the superior and inferior aspect the right hip no osteophyte formation off of the superior aspect of the acetabulum.   Underlying subchondral sclerosis is identified.   No evidence of femoral head collapse.   No evidence of dislocation or acute fracture.   US CAROTID BILATERAL performed on 06/15/2022 Color duplex indicates minimal heterogeneous plaque, with no hemodynamically significant stenosis by duplex criteria in the extracranial cerebrovascular circulation.  TRANSTHORACIC ECHOCARDIOGRAM performed on  05/17/2022 Left ventricular ejection fraction, by estimation, is 60 to 65%. The left ventricle has normal function. The left ventricle has no regional wall motion abnormalities.  Left ventricular diastolic parameters are consistent with Grade I diastolic dysfunction (impaired relaxation). Right ventricular systolic function is normal. The right ventricular size is normal.  The mitral valve is normal in structure. No evidence of mitral valve regurgitation.  The aortic valve was not well visualized. Aortic valve regurgitation is not visualized.   CT CORONARY MORPH W/CTA COR W/SCORE W/CA W/CM &/OR WO/CM performed on 02/24/2021 No significant extracardiac findings Normal coronary calcium score of 0. Patient is low risk for coronary events. Normal coronary origin with right dominance. No evidence of CAD. CAD-RADS 0. Consider non-atherosclerotic causes of chest pain.  Impression and Plan:  Kristine Rangel has been referred for pre-anesthesia review and clearance prior to her undergoing the planned anesthetic and procedural courses. Available labs, pertinent testing, and imaging results were personally reviewed by me in preparation for upcoming operative/procedural course. [facility] medical record has been updated following extensive record review and patient interview with PAT staff.   This patient has been appropriately cleared by cardiology with an overall ACCEPTABLE risk of experiencing significant perioperative cardiovascular complications. Based on clinical review performed today (02/20/24), barring any significant acute changes in the patient's overall condition, it is anticipated that she will be able to proceed with the planned surgical intervention. Any acute changes in clinical condition may necessitate her procedure being postponed and/or cancelled. Patient will meet with anesthesia team (MD and/or CRNA) on the day of her procedure for preoperative evaluation/assessment. Questions regarding anesthetic course will be fielded at that time.   Pre-surgical instructions were reviewed with the patient during her PAT appointment, and questions were fielded to satisfaction by  PAT clinical staff. She has been instructed on which medications that she will need to hold prior to surgery, as well as the ones that have been deemed safe/appropriate to take on the day of her procedure. As part of the general education provided by PAT, patient made aware both verbally and in writing, that she would need to abstain from the use of any illegal substances during her perioperative course.  She was advised that failure to follow the provided instructions could necessitate case cancellation or result in serious perioperative complications up to and including death. Patient encouraged to contact PAT and/or her surgeon's office to discuss any questions or concerns that may arise prior to surgery; verbalized understanding.   Quentin Mulling, MSN, APRN, FNP-C, CEN [facility]  Perioperative Services Nurse Practitioner Phone: [phone] Fax: [phone] 02/20/24 2:01 PM  NOTE: This note has been prepared using Dragon dictation software. Despite my best ability to proofread, there is always the potential that unintentional transcriptional errors may still occur from this process.

## 2024-02-20 NOTE — Telephone Encounter (Signed)
    Patient Consent for Virtual Visit         Kristine Rangel has provided verbal consent on 02/20/2024 for a virtual visit (video or telephone).   CONSENT FOR VIRTUAL VISIT FOR:  Kristine Rangel  By participating in this virtual visit I agree to the following:  I hereby voluntarily request, consent and authorize [facility] and its employed or contracted physicians, physician assistants, nurse practitioners or other licensed health care professionals (the Practitioner), to provide me with telemedicine health care services (the "Services") as deemed necessary by the treating Practitioner. I acknowledge and consent to receive the Services by the Practitioner via telemedicine. I understand that the telemedicine visit will involve communicating with the Practitioner through live audiovisual communication technology and the disclosure of certain medical information by electronic transmission. I acknowledge that I have been given the opportunity to request an in-person assessment or other available alternative prior to the telemedicine visit and am voluntarily participating in the telemedicine visit.  I understand that I have the right to withhold or withdraw my consent to the use of telemedicine in the course of my care at any time, without affecting my right to future care or treatment, and that the Practitioner or I may terminate the telemedicine visit at any time. I understand that I have the right to inspect all information obtained and/or recorded in the course of the telemedicine visit and may receive copies of available information for a reasonable fee.  I understand that some of the potential risks of receiving the Services via telemedicine include:  Delay or interruption in medical evaluation due to technological equipment failure or disruption; Information transmitted may not be sufficient (e.g. poor resolution of images) to allow for appropriate medical decision making by the  Practitioner; and/or  In rare instances, security protocols could fail, causing a breach of personal health information.  Furthermore, I acknowledge that it is my responsibility to provide information about my medical history, conditions and care that is complete and accurate to the best of my ability. I acknowledge that Practitioner's advice, recommendations, and/or decision may be based on factors not within their control, such as incomplete or inaccurate data provided by me or distortions of diagnostic images or specimens that may result from electronic transmissions. I understand that the practice of medicine is not an exact science and that Practitioner makes no warranties or guarantees regarding treatment outcomes. I acknowledge that a copy of this consent can be made available to me via my patient portal [facility] MyChart), or I can request a printed copy by calling the office of [facility].    I understand that my insurance will be billed for this visit.   I have read or had this consent read to me. I understand the contents of this consent, which adequately explains the benefits and risks of the Services being provided via telemedicine.  I have been provided ample opportunity to ask questions regarding this consent and the Services and have had my questions answered to my satisfaction. I give my informed consent for the services to be provided through the use of telemedicine in my medical care

## 2024-02-20 NOTE — Telephone Encounter (Signed)
 Patient scheduled 02/20/24

## 2024-02-21 ENCOUNTER — Other Ambulatory Visit: Payer: Self-pay

## 2024-02-21 ENCOUNTER — Ambulatory Visit: Admitting: Urgent Care

## 2024-02-21 ENCOUNTER — Encounter
Admission: RE | Disposition: A | Discharge status: Home / Self Care | Payer: Self-pay | Source: Home / Self Care | Attending: Surgery

## 2024-02-21 ENCOUNTER — Observation Stay
Admission: RE | Admit: 2024-02-21 | Discharge: 2024-03-03 | Disposition: A | Discharge status: Home / Self Care | Attending: Surgery | Admitting: Surgery

## 2024-02-21 ENCOUNTER — Ambulatory Visit

## 2024-02-21 ENCOUNTER — Encounter: Payer: Self-pay | Admitting: Surgery

## 2024-02-21 DIAGNOSIS — M1611 Unilateral primary osteoarthritis, right hip: Secondary | ICD-10-CM | POA: Diagnosis not present

## 2024-02-21 DIAGNOSIS — N1831 Chronic kidney disease, stage 3a: Secondary | ICD-10-CM | POA: Insufficient documentation

## 2024-02-21 DIAGNOSIS — J45909 Unspecified asthma, uncomplicated: Secondary | ICD-10-CM | POA: Insufficient documentation

## 2024-02-21 DIAGNOSIS — E1122 Type 2 diabetes mellitus with diabetic chronic kidney disease: Secondary | ICD-10-CM | POA: Diagnosis not present

## 2024-02-21 DIAGNOSIS — I5030 Unspecified diastolic (congestive) heart failure: Secondary | ICD-10-CM | POA: Diagnosis not present

## 2024-02-21 DIAGNOSIS — Z79899 Other long term (current) drug therapy: Secondary | ICD-10-CM | POA: Insufficient documentation

## 2024-02-21 DIAGNOSIS — J449 Chronic obstructive pulmonary disease, unspecified: Secondary | ICD-10-CM | POA: Diagnosis not present

## 2024-02-21 DIAGNOSIS — Z87891 Personal history of nicotine dependence: Secondary | ICD-10-CM | POA: Diagnosis not present

## 2024-02-21 DIAGNOSIS — Z8673 Personal history of transient ischemic attack (TIA), and cerebral infarction without residual deficits: Secondary | ICD-10-CM | POA: Insufficient documentation

## 2024-02-21 DIAGNOSIS — Z96611 Presence of right artificial shoulder joint: Secondary | ICD-10-CM | POA: Diagnosis not present

## 2024-02-21 DIAGNOSIS — I130 Hypertensive heart and chronic kidney disease with heart failure and stage 1 through stage 4 chronic kidney disease, or unspecified chronic kidney disease: Secondary | ICD-10-CM | POA: Diagnosis not present

## 2024-02-21 DIAGNOSIS — Z7982 Long term (current) use of aspirin: Secondary | ICD-10-CM | POA: Diagnosis not present

## 2024-02-21 DIAGNOSIS — Z96653 Presence of artificial knee joint, bilateral: Secondary | ICD-10-CM | POA: Insufficient documentation

## 2024-02-21 DIAGNOSIS — Z96641 Presence of right artificial hip joint: Principal | ICD-10-CM

## 2024-02-21 DIAGNOSIS — Z01812 Encounter for preprocedural laboratory examination: Secondary | ICD-10-CM

## 2024-02-21 DIAGNOSIS — Z96612 Presence of left artificial shoulder joint: Secondary | ICD-10-CM | POA: Diagnosis not present

## 2024-02-21 LAB — GLUCOSE, CAPILLARY
Glucose-Capillary: 83 mg/dL (ref 70–99)
Glucose-Capillary: 91 mg/dL (ref 70–99)

## 2024-02-21 SURGERY — ARTHROPLASTY, HIP, TOTAL,POSTERIOR APPROACH
Anesthesia: Spinal | Site: Hip | Laterality: Right

## 2024-02-21 MED ORDER — LORATADINE 10 MG PO TABS
10.0000 mg | ORAL_TABLET | Freq: Every day | ORAL | Status: DC
  Administered 2024-02-22 – 2024-03-03 (×11): 10 mg via ORAL
  Filled 2024-02-21 (×11): qty 1

## 2024-02-21 MED ORDER — TIMOLOL MALEATE 0.5 % OP SOLN
1.0000 [drp] | Freq: Every day | OPHTHALMIC | Status: DC
  Administered 2024-02-21 – 2024-03-02 (×11): 1 [drp] via OPHTHALMIC
  Filled 2024-02-21 (×2): qty 5

## 2024-02-21 MED ORDER — FUROSEMIDE 40 MG PO TABS
40.0000 mg | ORAL_TABLET | Freq: Two times a day (BID) | ORAL | Status: DC
  Administered 2024-02-21 – 2024-03-03 (×21): 40 mg via ORAL
  Filled 2024-02-21 (×4): qty 1
  Filled 2024-02-21: qty 2
  Filled 2024-02-21 (×3): qty 1
  Filled 2024-02-21: qty 2
  Filled 2024-02-21 (×12): qty 1

## 2024-02-21 MED ORDER — SODIUM CHLORIDE 0.9 % IV SOLN: INTRAVENOUS | Status: AC

## 2024-02-21 MED ORDER — ACETAMINOPHEN 10 MG/ML IV SOLN
INTRAVENOUS | Status: AC
  Filled 2024-02-21: qty 100

## 2024-02-21 MED ORDER — DIPHENHYDRAMINE HCL 12.5 MG/5ML PO ELIX: 12.5000 mg | ORAL_SOLUTION | ORAL | Status: DC | PRN

## 2024-02-21 MED ORDER — ONDANSETRON HCL 4 MG PO TABS: 4.0000 mg | ORAL_TABLET | Freq: Four times a day (QID) | ORAL | Status: DC | PRN

## 2024-02-21 MED ORDER — GABAPENTIN 300 MG PO CAPS
300.0000 mg | ORAL_CAPSULE | Freq: Three times a day (TID) | ORAL | Status: DC
  Administered 2024-02-21 – 2024-03-03 (×32): 300 mg via ORAL
  Filled 2024-02-21 (×14): qty 1
  Filled 2024-02-21 (×2): qty 3
  Filled 2024-02-21 (×16): qty 1

## 2024-02-21 MED ORDER — TRIAMCINOLONE ACETONIDE 40 MG/ML IJ SUSP
INTRAMUSCULAR | Status: DC | PRN
  Administered 2024-02-21: 80 mg via INTRAMUSCULAR

## 2024-02-21 MED ORDER — ALBUTEROL SULFATE (2.5 MG/3ML) 0.083% IN NEBU: 2.5000 mg | INHALATION_SOLUTION | Freq: Four times a day (QID) | RESPIRATORY_TRACT | Status: DC | PRN

## 2024-02-21 MED ORDER — IRRISEPT - 450ML BOTTLE WITH 0.05% CHG IN STERILE WATER, USP 99.95% OPTIME
TOPICAL | Status: DC | PRN
  Administered 2024-02-21: 450 mL

## 2024-02-21 MED ORDER — MIRABEGRON ER 50 MG PO TB24
50.0000 mg | ORAL_TABLET | Freq: Every day | ORAL | Status: DC
  Administered 2024-02-21 – 2024-03-03 (×12): 50 mg via ORAL
  Filled 2024-02-21 (×12): qty 1

## 2024-02-21 MED ORDER — MONTELUKAST SODIUM 10 MG PO TABS
10.0000 mg | ORAL_TABLET | Freq: Every day | ORAL | Status: DC
  Administered 2024-02-21 – 2024-03-02 (×11): 10 mg via ORAL
  Filled 2024-02-21 (×11): qty 1

## 2024-02-21 MED ORDER — FLUTICASONE PROPIONATE 50 MCG/ACT NA SUSP
1.0000 | Freq: Every day | NASAL | Status: DC | PRN
  Filled 2024-02-21: qty 16

## 2024-02-21 MED ORDER — DOCUSATE SODIUM 100 MG PO CAPS
ORAL_CAPSULE | ORAL | Status: AC
  Filled 2024-02-21: qty 1

## 2024-02-21 MED ORDER — BUPIVACAINE-EPINEPHRINE (PF) 0.5% -1:200000 IJ SOLN
INTRAMUSCULAR | Status: AC
  Filled 2024-02-21: qty 30

## 2024-02-21 MED ORDER — KETOROLAC TROMETHAMINE 30 MG/ML IJ SOLN
INTRAMUSCULAR | Status: DC | PRN
  Administered 2024-02-21: 30 mg via INTRAMUSCULAR

## 2024-02-21 MED ORDER — SUVOREXANT 20 MG PO TABS
1.0000 | ORAL_TABLET | Freq: Every day | ORAL | Status: DC
Start: 2024-02-21 — End: 2024-02-21

## 2024-02-21 MED ORDER — ISOSORBIDE MONONITRATE ER 30 MG PO TB24
90.0000 mg | ORAL_TABLET | Freq: Every day | ORAL | Status: DC
  Administered 2024-02-21 – 2024-03-02 (×10): 90 mg via ORAL
  Filled 2024-02-21 (×13): qty 3

## 2024-02-21 MED ORDER — BRIMONIDINE TARTRATE-TIMOLOL 0.2-0.5 % OP SOLN: 1.0000 [drp] | Freq: Every day | OPHTHALMIC | Status: DC

## 2024-02-21 MED ORDER — CARVEDILOL 3.125 MG PO TABS
6.2500 mg | ORAL_TABLET | Freq: Two times a day (BID) | ORAL | Status: DC
  Administered 2024-02-22 – 2024-03-03 (×19): 6.25 mg via ORAL
  Filled 2024-02-21 (×21): qty 2

## 2024-02-21 MED ORDER — METOCLOPRAMIDE HCL 10 MG PO TABS: 5.0000 mg | ORAL_TABLET | Freq: Three times a day (TID) | ORAL | Status: DC | PRN

## 2024-02-21 MED ORDER — BUPIVACAINE-EPINEPHRINE (PF) 0.5% -1:200000 IJ SOLN
INTRAMUSCULAR | Status: DC | PRN
  Administered 2024-02-21: 30 mL

## 2024-02-21 MED ORDER — SODIUM CHLORIDE 0.9 % IR SOLN
Status: DC | PRN
  Administered 2024-02-21: 3000 mL

## 2024-02-21 MED ORDER — GABAPENTIN 300 MG PO CAPS
ORAL_CAPSULE | ORAL | Status: AC
  Filled 2024-02-21: qty 1

## 2024-02-21 MED ORDER — PROPOFOL 500 MG/50ML IV EMUL
INTRAVENOUS | Status: DC | PRN
  Administered 2024-02-21: 125 ug/kg/min via INTRAVENOUS

## 2024-02-21 MED ORDER — CEFAZOLIN SODIUM-DEXTROSE 2-4 GM/100ML-% IV SOLN
INTRAVENOUS | Status: AC
  Filled 2024-02-21: qty 100

## 2024-02-21 MED ORDER — DOCUSATE SODIUM 100 MG PO CAPS
100.0000 mg | ORAL_CAPSULE | Freq: Two times a day (BID) | ORAL | Status: DC
  Administered 2024-02-21 – 2024-03-02 (×18): 100 mg via ORAL
  Filled 2024-02-21 (×19): qty 1

## 2024-02-21 MED ORDER — ACETAMINOPHEN 325 MG PO TABS
325.0000 mg | ORAL_TABLET | Freq: Four times a day (QID) | ORAL | Status: DC | PRN
  Administered 2024-02-29 – 2024-03-03 (×2): 650 mg via ORAL
  Filled 2024-02-21 (×3): qty 2

## 2024-02-21 MED ORDER — 0.9 % SODIUM CHLORIDE (POUR BTL) OPTIME
TOPICAL | Status: DC | PRN
  Administered 2024-02-21: 500 mL

## 2024-02-21 MED ORDER — ATORVASTATIN CALCIUM 20 MG PO TABS
ORAL_TABLET | ORAL | Status: AC
  Filled 2024-02-21: qty 4

## 2024-02-21 MED ORDER — BISACODYL 10 MG RE SUPP: 10.0000 mg | Freq: Every day | RECTAL | Status: DC | PRN

## 2024-02-21 MED ORDER — FLUTICASONE FUROATE-VILANTEROL 200-25 MCG/ACT IN AEPB
1.0000 | INHALATION_SPRAY | Freq: Every day | RESPIRATORY_TRACT | Status: DC
  Administered 2024-02-22 – 2024-03-03 (×11): 1 via RESPIRATORY_TRACT
  Filled 2024-02-21 (×2): qty 28

## 2024-02-21 MED ORDER — TRIAMCINOLONE ACETONIDE 40 MG/ML IJ SUSP
INTRAMUSCULAR | Status: AC
  Filled 2024-02-21: qty 2

## 2024-02-21 MED ORDER — EMPAGLIFLOZIN 25 MG PO TABS
25.0000 mg | ORAL_TABLET | Freq: Every day | ORAL | Status: DC
  Administered 2024-02-22 – 2024-03-03 (×11): 25 mg via ORAL
  Filled 2024-02-21 (×11): qty 1

## 2024-02-21 MED ORDER — CHLORHEXIDINE GLUCONATE 4 % EX SOLN: 1.0000 | CUTANEOUS | 1 refills | Status: DC

## 2024-02-21 MED ORDER — FENTANYL CITRATE (PF) 100 MCG/2ML IJ SOLN
INTRAMUSCULAR | Status: AC
  Filled 2024-02-21: qty 2

## 2024-02-21 MED ORDER — ONDANSETRON HCL 4 MG/2ML IJ SOLN
INTRAMUSCULAR | Status: DC | PRN
  Administered 2024-02-21: 4 mg via INTRAVENOUS

## 2024-02-21 MED ORDER — SODIUM CHLORIDE 0.9 % IV SOLN
INTRAVENOUS | Status: DC | PRN
  Administered 2024-02-21: 60 mL

## 2024-02-21 MED ORDER — DULOXETINE HCL 30 MG PO CPEP
60.0000 mg | ORAL_CAPSULE | Freq: Every day | ORAL | Status: DC
  Administered 2024-02-21 – 2024-03-02 (×11): 60 mg via ORAL
  Filled 2024-02-21 (×11): qty 2

## 2024-02-21 MED ORDER — PANTOPRAZOLE SODIUM 40 MG PO TBEC
80.0000 mg | DELAYED_RELEASE_TABLET | Freq: Every day | ORAL | Status: DC
  Administered 2024-02-22 – 2024-03-03 (×11): 80 mg via ORAL
  Filled 2024-02-21 (×10): qty 2

## 2024-02-21 MED ORDER — BRIMONIDINE TARTRATE 0.2 % OP SOLN
1.0000 [drp] | Freq: Every day | OPHTHALMIC | Status: DC
  Administered 2024-02-21 – 2024-03-02 (×11): 1 [drp] via OPHTHALMIC
  Filled 2024-02-21 (×2): qty 5

## 2024-02-21 MED ORDER — ACETAMINOPHEN 500 MG PO TABS
1000.0000 mg | ORAL_TABLET | Freq: Four times a day (QID) | ORAL | Status: AC
  Administered 2024-02-21 – 2024-02-22 (×4): 1000 mg via ORAL
  Filled 2024-02-21 (×4): qty 2

## 2024-02-21 MED ORDER — FLEET ENEMA RE ENEM: 1.0000 | ENEMA | Freq: Once | RECTAL | Status: DC | PRN

## 2024-02-21 MED ORDER — ACETAMINOPHEN 10 MG/ML IV SOLN
INTRAVENOUS | Status: DC | PRN
  Administered 2024-02-21: 1000 mg via INTRAVENOUS

## 2024-02-21 MED ORDER — FENTANYL CITRATE (PF) 100 MCG/2ML IJ SOLN
25.0000 ug | INTRAMUSCULAR | Status: DC | PRN
Start: 2024-02-21 — End: 2024-02-21

## 2024-02-21 MED ORDER — KETOROLAC TROMETHAMINE 15 MG/ML IJ SOLN
INTRAMUSCULAR | Status: AC
  Filled 2024-02-21: qty 1

## 2024-02-21 MED ORDER — GLYCOPYRROLATE 0.2 MG/ML IJ SOLN
INTRAMUSCULAR | Status: DC | PRN
  Administered 2024-02-21: .2 mg via INTRAVENOUS

## 2024-02-21 MED ORDER — APIXABAN 2.5 MG PO TABS
2.5000 mg | ORAL_TABLET | Freq: Two times a day (BID) | ORAL | Status: DC
  Administered 2024-02-22 – 2024-03-03 (×21): 2.5 mg via ORAL
  Filled 2024-02-21 (×21): qty 1

## 2024-02-21 MED ORDER — HYDROMORPHONE HCL 1 MG/ML IJ SOLN
0.2500 mg | INTRAMUSCULAR | Status: DC | PRN
  Administered 2024-02-23 – 2024-02-24 (×3): 0.5 mg via INTRAVENOUS
  Administered 2024-02-29: 0.25 mg via INTRAVENOUS
  Filled 2024-02-21 (×4): qty 0.5

## 2024-02-21 MED ORDER — SODIUM CHLORIDE (PF) 0.9 % IJ SOLN
INTRAMUSCULAR | Status: AC
  Filled 2024-02-21: qty 40

## 2024-02-21 MED ORDER — TRANEXAMIC ACID-NACL 1000-0.7 MG/100ML-% IV SOLN
INTRAVENOUS | Status: AC
  Filled 2024-02-21: qty 100

## 2024-02-21 MED ORDER — KETOROLAC TROMETHAMINE 15 MG/ML IJ SOLN
7.5000 mg | Freq: Four times a day (QID) | INTRAMUSCULAR | Status: AC
  Administered 2024-02-21 – 2024-02-22 (×3): 7.5 mg via INTRAVENOUS
  Filled 2024-02-21: qty 1

## 2024-02-21 MED ORDER — VANCOMYCIN HCL 1000 MG IV SOLR
INTRAVENOUS | Status: AC
  Filled 2024-02-21: qty 20

## 2024-02-21 MED ORDER — ATORVASTATIN CALCIUM 80 MG PO TABS
80.0000 mg | ORAL_TABLET | Freq: Every day | ORAL | Status: DC
  Administered 2024-02-21 – 2024-03-02 (×11): 80 mg via ORAL
  Filled 2024-02-21 (×10): qty 1

## 2024-02-21 MED ORDER — FENTANYL CITRATE (PF) 100 MCG/2ML IJ SOLN
INTRAMUSCULAR | Status: DC | PRN
  Administered 2024-02-21: 50 ug via INTRAVENOUS
  Administered 2024-02-21 (×2): 25 ug via INTRAVENOUS

## 2024-02-21 MED ORDER — TIZANIDINE HCL 2 MG PO CAPS
2.0000 mg | ORAL_CAPSULE | Freq: Four times a day (QID) | ORAL | Status: DC | PRN
  Filled 2024-02-21: qty 1

## 2024-02-21 MED ORDER — ALBUTEROL SULFATE HFA 108 (90 BASE) MCG/ACT IN AERS: 1.0000 | INHALATION_SPRAY | Freq: Four times a day (QID) | RESPIRATORY_TRACT | Status: DC | PRN

## 2024-02-21 MED ORDER — ONDANSETRON HCL 4 MG/2ML IJ SOLN: 4.0000 mg | Freq: Four times a day (QID) | INTRAMUSCULAR | Status: DC | PRN

## 2024-02-21 MED ORDER — OXYCODONE HCL 5 MG PO TABS
5.0000 mg | ORAL_TABLET | ORAL | Status: DC | PRN
  Administered 2024-02-21 – 2024-02-22 (×2): 5 mg via ORAL
  Administered 2024-02-22 – 2024-02-27 (×12): 10 mg via ORAL
  Administered 2024-02-28 – 2024-03-03 (×6): 5 mg via ORAL
  Filled 2024-02-21: qty 1
  Filled 2024-02-21: qty 2
  Filled 2024-02-21: qty 1
  Filled 2024-02-21 (×4): qty 2
  Filled 2024-02-21 (×2): qty 1
  Filled 2024-02-21: qty 2
  Filled 2024-02-21: qty 1
  Filled 2024-02-21 (×2): qty 2
  Filled 2024-02-21: qty 1
  Filled 2024-02-21 (×4): qty 2
  Filled 2024-02-21 (×2): qty 1

## 2024-02-21 MED ORDER — CEFAZOLIN SODIUM-DEXTROSE 3-4 GM/150ML-% IV SOLN
3.0000 g | Freq: Four times a day (QID) | INTRAVENOUS | Status: AC
  Administered 2024-02-21 (×2): 3 g via INTRAVENOUS
  Filled 2024-02-21 (×3): qty 150

## 2024-02-21 MED ORDER — METOCLOPRAMIDE HCL 5 MG/ML IJ SOLN: 5.0000 mg | Freq: Three times a day (TID) | INTRAMUSCULAR | Status: DC | PRN

## 2024-02-21 MED ORDER — PHENYLEPHRINE HCL-NACL 20-0.9 MG/250ML-% IV SOLN
INTRAVENOUS | Status: DC | PRN
  Administered 2024-02-21: 5.067 ug/min via INTRAVENOUS
  Administered 2024-02-21: 160 ug via INTRAVENOUS

## 2024-02-21 MED ORDER — PROPOFOL 10 MG/ML IV BOLUS
INTRAVENOUS | Status: AC
  Filled 2024-02-21: qty 20

## 2024-02-21 MED ORDER — BUPIVACAINE HCL (PF) 0.5 % IJ SOLN
INTRAMUSCULAR | Status: DC | PRN
Start: 2024-02-21 — End: 2024-02-21
  Administered 2024-02-21: 2.8 mL

## 2024-02-21 MED ORDER — VANCOMYCIN HCL 1000 MG IV SOLR
INTRAVENOUS | Status: DC | PRN
Start: 2024-02-21 — End: 2024-02-21
  Administered 2024-02-21: 1000 mg via TOPICAL

## 2024-02-21 MED ORDER — CHLORHEXIDINE GLUCONATE 0.12 % MT SOLN
OROMUCOSAL | Status: AC
  Filled 2024-02-21: qty 15

## 2024-02-21 MED ORDER — IRBESARTAN 75 MG PO TABS
37.5000 mg | ORAL_TABLET | Freq: Every day | ORAL | Status: DC
  Administered 2024-02-21 – 2024-03-03 (×11): 37.5 mg via ORAL
  Filled 2024-02-21 (×12): qty 0.5

## 2024-02-21 MED ORDER — CARVEDILOL 3.125 MG PO TABS
ORAL_TABLET | ORAL | Status: AC
  Filled 2024-02-21: qty 2

## 2024-02-21 MED ORDER — TOPIRAMATE 25 MG PO TABS
50.0000 mg | ORAL_TABLET | Freq: Every day | ORAL | Status: DC
  Administered 2024-02-21 – 2024-03-02 (×11): 50 mg via ORAL
  Filled 2024-02-21: qty 2
  Filled 2024-02-21: qty 0.5
  Filled 2024-02-21 (×9): qty 2

## 2024-02-21 MED ORDER — MUPIROCIN 2 % EX OINT: 1.0000 | TOPICAL_OINTMENT | Freq: Two times a day (BID) | CUTANEOUS | 0 refills | Status: DC

## 2024-02-21 MED ORDER — LIDOCAINE HCL (CARDIAC) PF 100 MG/5ML IV SOSY
PREFILLED_SYRINGE | INTRAVENOUS | Status: DC | PRN
  Administered 2024-02-21: 100 mg via INTRAVENOUS

## 2024-02-21 MED ORDER — DEXAMETHASONE SODIUM PHOSPHATE 10 MG/ML IJ SOLN
INTRAMUSCULAR | Status: DC | PRN
  Administered 2024-02-21: 10 mg via INTRAVENOUS

## 2024-02-21 MED ORDER — BUPIVACAINE LIPOSOME 1.3 % IJ SUSP
INTRAMUSCULAR | Status: AC
  Filled 2024-02-21: qty 20

## 2024-02-21 MED ORDER — MAGNESIUM HYDROXIDE 400 MG/5ML PO SUSP: 30.0000 mL | Freq: Every day | ORAL | Status: DC | PRN

## 2024-02-21 SURGICAL SUPPLY — 53 items
BLADE SAGITTAL WIDE XTHICK NO (BLADE) ×1 IMPLANT
BLADE SURG SZ20 CARB STEEL (BLADE) ×1 IMPLANT
CHLORAPREP W/TINT 26 (MISCELLANEOUS) ×1 IMPLANT
CNTNR URN SCR LID CUP LEK RST (MISCELLANEOUS) IMPLANT
DRAPE IMP U-DRAPE 54X76 (DRAPES) IMPLANT
DRAPE INCISE IOBAN 66X60 STRL (DRAPES) ×1 IMPLANT
DRAPE SHEET LG 3/4 BI-LAMINATE (DRAPES) ×1 IMPLANT
DRAPE SURG 17X11 SM STRL (DRAPES) ×2 IMPLANT
DRSG MEPILEX SACRM 8.7X9.8 (GAUZE/BANDAGES/DRESSINGS) ×1 IMPLANT
DRSG OPSITE POSTOP 4X10 (GAUZE/BANDAGES/DRESSINGS) ×1 IMPLANT
ELECT CAUTERY BLADE 6.4 (BLADE) ×1 IMPLANT
GAUZE 4X4 16PLY ~~LOC~~+RFID DBL (SPONGE) ×1 IMPLANT
GAUZE XEROFORM 1X8 LF (GAUZE/BANDAGES/DRESSINGS) ×1 IMPLANT
GLOVE BIO SURGEON STRL SZ7.5 (GLOVE) ×4 IMPLANT
GLOVE BIO SURGEON STRL SZ8 (GLOVE) ×4 IMPLANT
GLOVE BIOGEL PI IND STRL 8 (GLOVE) ×1 IMPLANT
GLOVE INDICATOR 8.0 STRL GRN (GLOVE) ×1 IMPLANT
GOWN STRL REUS W/ TWL LRG LVL3 (GOWN DISPOSABLE) ×1 IMPLANT
GOWN STRL REUS W/ TWL XL LVL3 (GOWN DISPOSABLE) ×1 IMPLANT
HEAD CERAMIC BIOLOX 32MM (Head) IMPLANT
HIP SLEEVE BIOLOX -6MM OFFSET (Sleeve) ×1 IMPLANT
HOLSTER ELECTROSUGICAL PENCIL (MISCELLANEOUS) ×1 IMPLANT
HOOD PEEL AWAY T7 (MISCELLANEOUS) ×3 IMPLANT
JET LAVAGE IRRISEPT WOUND (IRRIGATION / IRRIGATOR) ×1 IMPLANT
KIT PREVENA INCISION MGT20CM45 (CANNISTER) IMPLANT
KIT TURNOVER KIT A (KITS) ×1 IMPLANT
LAVAGE JET IRRISEPT WOUND (IRRIGATION / IRRIGATOR) IMPLANT
LINER ACE G7 32 SZC HIGH WALL (Liner) IMPLANT
MANIFOLD NEPTUNE II (INSTRUMENTS) ×1 IMPLANT
NDL FILTER BLUNT 18X1 1/2 (NEEDLE) ×1 IMPLANT
NDL SAFETY ECLIPSE 18X1.5 (NEEDLE) ×2 IMPLANT
NDL SPNL 20GX3.5 QUINCKE YW (NEEDLE) ×1 IMPLANT
NEEDLE FILTER BLUNT 18X1 1/2 (NEEDLE) IMPLANT
NEEDLE SPNL 20GX3.5 QUINCKE YW (NEEDLE) ×1 IMPLANT
PACK HIP PROSTHESIS (MISCELLANEOUS) ×1 IMPLANT
PENCIL SMOKE EVACUATOR (MISCELLANEOUS) ×1 IMPLANT
PULSAVAC PLUS IRRIG FAN TIP (DISPOSABLE) ×1 IMPLANT
SHELL ACET G7 3H 48 SZC (Shell) IMPLANT
SLEEVE HIP BIOLOX -6MM OFFSET (Sleeve) IMPLANT
SOL .9 NS 3000ML IRR UROMATIC (IV SOLUTION) ×1 IMPLANT
SPONGE T-LAP 18X18 ~~LOC~~+RFID (SPONGE) IMPLANT
STAPLER SKIN PROX 35W (STAPLE) ×1 IMPLANT
STEM FEMORAL SZ12 140MM (Stem) IMPLANT
SUT TICRON 2-0 30IN 311381 (SUTURE) ×3 IMPLANT
SUT VIC AB 0 CT1 36 (SUTURE) ×1 IMPLANT
SUT VIC AB 1 CT1 36 (SUTURE) ×1 IMPLANT
SUT VIC AB 2-0 CT1 (SUTURE) ×3 IMPLANT
SYR 10ML LL (SYRINGE) ×1 IMPLANT
SYR 20ML LL LF (SYRINGE) ×1 IMPLANT
SYR 30ML LL (SYRINGE) ×2 IMPLANT
TIP FAN IRRIG PULSAVAC PLUS (DISPOSABLE) ×1 IMPLANT
TRAP FLUID SMOKE EVACUATOR (MISCELLANEOUS) ×1 IMPLANT
WATER STERILE IRR 1000ML POUR (IV SOLUTION) ×1 IMPLANT

## 2024-02-21 NOTE — Transfer of Care (Signed)
 Immediate Anesthesia Transfer of Care Note  Patient: Kristine Rangel  Procedure(s) Performed: ARTHROPLASTY, HIP, TOTAL,POSTERIOR APPROACH (Right: Hip)  Patient Location: PACU  Anesthesia Type:General  Level of Consciousness: awake and patient cooperative  Airway & Oxygen Therapy: Patient Spontanous Breathing  Post-op Assessment: Report given to RN and Post -op Vital signs reviewed and stable  Post vital signs: stable  Last Vitals:  Vitals Value Taken Time  BP 123/58 02/21/24 1348  Temp    Pulse 79 02/21/24 1353  Resp 19 02/21/24 1353  SpO2 94 % 02/21/24 1353  Vitals shown include unfiled device data.  Last Pain:  Vitals:   02/21/24 0932  TempSrc: Temporal  PainSc: 8          Complications: No notable events documented.

## 2024-02-21 NOTE — Discharge Summary (Addendum)
 Physician Discharge Summary  Patient ID: Kristine Rangel MRN: [id number] DOB/AGE: 11-18-1955 68 y.o.  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]   Discharge Diagnoses: Patient Active Problem List   Diagnosis Date Noted   Status post total hip replacement, right 02/21/2024   Right sided weakness 05/15/2022   GERD (gastroesophageal reflux disease) 05/15/2022   Peripheral neuropathy 05/15/2022   Hyperlipidemia associated with type 2 diabetes mellitus (HCC) 05/15/2022   OSA (obstructive sleep apnea) 05/15/2022   Chronic heart failure with preserved ejection fraction (HFpEF) (HCC) 05/15/2022   Asthma, chronic 05/15/2022   Chronic kidney disease, stage 3a (HCC) 05/15/2022   Chest pain 02/05/2021   SOB (shortness of breath) 02/05/2021   Leg swelling 02/05/2021   Nonrheumatic mitral valve regurgitation 02/05/2021   Morbid obesity (HCC) 02/05/2021   S/P revision of total knee, right 05/05/2020   S/P right rotator cuff repair 07/30/2018   S/P left rotator cuff repair 11/23/2016   Essential hypertension 02/05/2003    Past Medical History:  Diagnosis Date   (HFpEF) heart failure with preserved ejection fraction (HCC)    a.) TTE 03/03/2021: EF 60-65%, no RWMAs, mild LA dil, RVSP 32.6, G2DD; b.) TTE 05/17/2022: EF 60-65%, no RWMAs, normal RVSF, G1DD   Anxiety    Arthritis    Asthma    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   Bladder leak    Cardiac murmur    Chest pain    a.) 10/07/2008 Neg Dobuatmine Echo; b.) LHC 09/11/2011 - minor lum irregs; no sig CAD; c.) MV 06/02/2015: no ischemia; d.) cCTA 02/2021: Ca2+ = 0. Normal coronaries   CKD (chronic  kidney disease), stage III (HCC)    COPD (chronic obstructive pulmonary disease) (HCC)    Depression    Diet-controlled type 2 diabetes mellitus (HCC)    Dyspnea    Essential hypertension    GERD (gastroesophageal reflux disease)    Glaucoma    History of kidney stones    History of left heart catheterization (LHC) 09/11/2011   a.) LHC 09/11/2011: minor luminal irregulaties mLAD, mLCx, mRCA - med mgmt.   Hyperlipidemia    Insomnia    a.) takes suvorexant    Long term current use of aspirin     MI (myocardial infarction) (HCC)    a.) in the 1990s; details unclear   Migraine with aura    Neuropathy    OSA on CPAP    Rotator cuff injury    Seasonal allergies    Tendinitis of right rotator cuff    TIA (transient ischemic attack)      Transfusion: None.   Consultants (if any):   Discharged Condition: Improved  Hospital Course: Kristine Rangel is an 68 y.o. female who was admitted 02/21/2024 with a diagnosis of primary osteoarthritis of the right hip and went to the operating room on 02/21/2024 and underwent the above named procedures.    Surgeries: Procedure(s): ARTHROPLASTY, HIP, TOTAL,POSTERIOR APPROACH on 02/21/2024 Patient tolerated the surgery well. Taken to PACU where she was stabilized and then transferred to the post-op recovery area and then eventually to the orthopaedic floor.  Started on Eliquis  2.5mg  every 12 hrs. Heels elevated on bed with rolled towels. No evidence of DVT.  Negative Homan. Physical therapy started on day #1 for gait training and transfer. OT started day #1 for ADL and assisted devices.  Patient's IV was removed on POD11.  Foley was removed on POD1.  Staples were removed on POD11.  Implants: Biomet press-fit system with a # 12 laterally offset Echo femoral stem, a 48 mm acetabular shell with an E-poly hi-wall liner, and a 32 mm ceramic head with a -6 mm neck adapter.   She was given perioperative antibiotics:  Anti-infectives (From admission, onward)     Start     Dose/Rate Route Frequency Ordered Stop   02/21/24 1900  ceFAZolin  (ANCEF ) IVPB 3g/150 mL premix        3 g 300 mL/hr over 30 Minutes Intravenous Every 6 hours 02/21/24 1816 02/22/24 0024   02/21/24 1238  vancomycin  (VANCOCIN ) powder  Status:  Discontinued          As needed 02/21/24 1248 02/21/24 1348   02/21/24 0600  ceFAZolin  (ANCEF ) IVPB 2g/100 mL premix        2 g 200 mL/hr over 30 Minutes Intravenous On call to O.R. 02/20/24 2300 02/21/24 1118     .  She was given sequential compression devices, early ambulation, and Eliquis  for DVT prophylaxis.  She benefited maximally from the hospital stay and there were no complications.    Recent vital signs:  Vitals:   03/02/24 2029 03/03/24 0424  BP: (!) 99/48 (!) 103/48  Pulse: 90 77  Resp: 16 17  Temp: 98 F (36.7 C) 98 F (36.7 C)  SpO2: 97% 100%    Recent laboratory studies:  Lab Results  Component Value Date   HGB 11.5 (L) 03/01/2024   HGB 9.7 (L) 02/23/2024   HGB 10.3 (L) 02/22/2024   Lab Results  Component Value Date   WBC 8.3 03/01/2024   PLT 481 (H) 03/01/2024   Lab Results  Component Value Date   INR 1.0 04/27/2020   Lab Results  Component Value Date   NA 137 02/22/2024   K 4.0 02/22/2024   CL 106 02/22/2024   CO2 24 02/22/2024   BUN 24 (H) 02/22/2024   CREATININE 1.16 (H) 03/01/2024   GLUCOSE 146 (H) 02/22/2024    Discharge Medications:   Allergies as of 03/03/2024       Reactions   Bee Venom Anaphylaxis   Penicillins Hives, Swelling, Other (See Comments)   Has patient had a PCN reaction causing immediate rash, facial/tongue/throat swelling, SOB or lightheadedness with hypotension: Yes Has patient had a PCN reaction causing severe rash involving mucus membranes or skin necrosis: No Has patient had a PCN reaction that required hospitalization No Has patient had a PCN reaction occurring within the last 10 years: No If all of the above answers are "NO", then may proceed with  Cephalosporin use.        Medication List     STOP taking these medications    aspirin  EC 81 MG tablet       TAKE these medications    acetaminophen  500 MG tablet Commonly known as: TYLENOL  Take 2 tablets (1,000 mg total) by mouth every 6 (six) hours.   albuterol  108 (90 Base) MCG/ACT inhaler Commonly known as: VENTOLIN  HFA Inhale into the lungs every 6 (six) hours as needed for wheezing or shortness of breath.   albuterol  (2.5 MG/3ML) 0.083% nebulizer solution Commonly known as: PROVENTIL  Take 2.5 mg by nebulization every 6 (six) hours as needed for wheezing or shortness of breath.  apixaban  2.5 MG Tabs tablet Commonly known as: ELIQUIS  Take 1 tablet (2.5 mg total) by mouth 2 (two) times daily.   atorvastatin  80 MG tablet Commonly known as: LIPITOR  Take 1 tablet (80 mg total) by mouth at bedtime.   Belsomra  20 MG Tabs Generic drug: Suvorexant  Take 1 tablet by mouth at bedtime.   carvedilol  6.25 MG tablet Commonly known as: COREG  TAKE 1 TABLET BY MOUTH TWICE A DAY   cetirizine 10 MG tablet Commonly known as: ZYRTEC Take 10 mg by mouth daily.   chlorhexidine  4 % external liquid Commonly known as: HIBICLENS  Apply 15 mLs (1 Application total) topically as directed for 30 doses. Use as directed daily for 5 days every other week for 6 weeks.   Combigan  0.2-0.5 % ophthalmic solution Generic drug: brimonidine -timolol  Place 1 drop into both eyes at bedtime.   DULoxetine  60 MG capsule Commonly known as: CYMBALTA  Take 60 mg by mouth at bedtime.   empagliflozin  25 MG Tabs tablet Commonly known as: JARDIANCE  Take 25 mg by mouth daily.   EPINEPHrine  0.3 mg/0.3 mL Soaj injection Commonly known as: EPI-PEN Inject 0.3 mg into the skin as needed for anaphylaxis.   esomeprazole 40 MG capsule Commonly known as: NEXIUM Take 40 mg by mouth 2 (two) times daily before a meal.   fluticasone  50 MCG/ACT nasal spray Commonly known as: FLONASE  Place 1 spray into both  nostrils daily as needed.   furosemide  40 MG tablet Commonly known as: LASIX  Take 40 mg by mouth 2 (two) times daily.   gabapentin  300 MG capsule Commonly known as: NEURONTIN  Take 300 mg by mouth 3 (three) times daily.   isosorbide  mononitrate 60 MG 24 hr tablet Commonly known as: IMDUR  TAKE 1.5 TABLETS (90 MG TOTAL) BY MOUTH DAILY   lidocaine  5 % Commonly known as: LIDODERM  Place 2 patches onto the skin daily.   montelukast  10 MG tablet Commonly known as: SINGULAIR  Take 10 mg by mouth at bedtime.   mupirocin  ointment 2 % Commonly known as: BACTROBAN  Place 1 Application into the nose 2 (two) times daily for 60 doses. Use as directed 2 times daily for 5 days every other week for 6 weeks.   Myrbetriq  50 MG Tb24 tablet Generic drug: mirabegron  ER Take 50 mg by mouth daily.   ondansetron  4 MG tablet Commonly known as: ZOFRAN  Take 1 tablet (4 mg total) by mouth every 6 (six) hours as needed for nausea.   oxyCODONE  5 MG immediate release tablet Commonly known as: Oxy IR/ROXICODONE  Take 1-2 tablets (5-10 mg total) by mouth every 4 (four) hours as needed for moderate pain (pain score 4-6).   Ozempic (0.25 or 0.5 MG/DOSE) 2 MG/3ML Sopn Generic drug: Semaglutide(0.25 or 0.5MG /DOS) Inject 0.25 mg into the skin once a week. Tuesday   Symbicort 160-4.5 MCG/ACT inhaler Generic drug: budesonide-formoterol  Inhale 2 puffs into the lungs daily.   tizanidine  2 MG capsule Commonly known as: ZANAFLEX  Take 2 mg by mouth every 6 (six) hours as needed for muscle spasms.   topiramate  50 MG tablet Commonly known as: TOPAMAX  Take 50 mg by mouth at bedtime.   traMADol  50 MG tablet Commonly known as: ULTRAM  Take 50 mg by mouth 2 (two) times daily.   valsartan  40 MG tablet Commonly known as: DIOVAN  Take 20 mg by mouth daily.               Durable Medical Equipment  (From admission, onward)           Start  Ordered   03/02/24 1625  For home use only DME Walker  rolling  Once       Question Answer Comment  Walker: With 5 Inch Wheels   Patient needs a walker to treat with the following condition Status post total hip replacement, right      03/02/24 1624   03/02/24 1625  For home use only DME Other see comment  Once       Comments: Bariatric bedside commode  Question:  Length of Need  Answer:  Lifetime   03/02/24 1624   02/21/24 1817  DME 3 n 1  Once        02/21/24 1816   02/21/24 1817  DME Walker rolling  Once       Question Answer Comment  Walker: With 5 Inch Wheels   Patient needs a walker to treat with the following condition Status post total hip replacement, right      02/21/24 1816            Diagnostic Studies: US  Venous Img Lower Unilateral Right (DVT) Result Date: 03/02/2024 CLINICAL DATA:  Right lower extremity pain and swelling. Recent hip replacement. EXAM: RIGHT LOWER EXTREMITY VENOUS DOPPLER ULTRASOUND TECHNIQUE: Gray-scale sonography with compression, as well as color and duplex ultrasound, were performed to evaluate the deep venous system(s) from the level of the common femoral vein through the popliteal and proximal calf veins. COMPARISON:  None Available. FINDINGS: VENOUS Normal compressibility of the common femoral, superficial femoral, and popliteal veins, as well as the visualized calf veins. Visualized portions of profunda femoral vein and great saphenous vein unremarkable. No filling defects to suggest DVT on grayscale or color Doppler imaging. Doppler waveforms show normal direction of venous flow, normal respiratory plasticity and response to augmentation. Limited views of the contralateral common femoral vein are unremarkable. OTHER None. Limitations: none IMPRESSION: Negative. Electronically Signed   By: Fernando Hoyer M.D.   On: 03/02/2024 11:52   DG HIP UNILAT W OR W/O PELVIS 2-3 VIEWS RIGHT Result Date: 02/21/2024 CLINICAL DATA:  Status post right hip replacement. EXAM: DG HIP (WITH OR WITHOUT PELVIS) 2-3V RIGHT  COMPARISON:  None Available. FINDINGS: Right hip arthroplasty in expected alignment. No periprosthetic lucency or fracture. Recent postsurgical change includes air and edema in the soft tissues. IMPRESSION: Right hip arthroplasty without immediate postoperative complication. Electronically Signed   By: Chadwick Colonel M.D.   On: 02/21/2024 17:51   Disposition: Plan for discharge home today with HHPT.   Contact information for follow-up providers     Rojelio Clement, PA-C Follow up in 14 day(s).   Specialty: Physician Assistant Why: Alexis Anger Contact information: [address] [place] [address] [phone]              Contact information for after-discharge care     Destination     [facility] Preferred SNF .   Service: Skilled Nursing Contact information: [address] [place]  [address] [phone]                    Signed: Juanell Nora PA-C 03/03/2024, 7:46 AM

## 2024-02-21 NOTE — H&P (Signed)
 History of Present Illness:  Kristine Rangel is a 68 y.o. female who has been referred by Horris Latino, PA-C, for evaluation and treatment of her right hip/groin pain secondary to presumed degenerative joint disease. The patient notes that the symptoms have been present for several years but been worsening over the past 6 to 8 months. She saw Horris Latino, PA-C, who tried a trochanteric injection which provided little if any relief of her symptoms. Subsequently, the patient was referred to Dr. Landry Mellow for an ultrasound-guided injection into the right hip. This also provided only temporary partial relief of her symptoms. She notes that her pain is worse with any standing or ambulation. She is ambulating with a walker for balance and support, and is unable to reciprocate stairs. She also has pain at night. She has been taking tramadol and applying Lidoderm patches as necessary with limited benefit. The patient denies any recent reinjury to the hip, nor does she note any numbness or paresthesias down her leg to her foot. She presents today to discuss alternative treatment options.  Current Outpatient Medications:  albuterol (PROVENTIL) 2.5 mg /3 mL (0.083 %) nebulizer solution Take 2.5 mg by nebulization every 6 (six) hours as needed for Wheezing  albuterol MDI, PROVENTIL, VENTOLIN, PROAIR, HFA 90 mcg/actuation inhaler Inhale 2 inhalations into the lungs every 4 (four) hours as needed for Wheezing or Shortness of Breath  ALPRAZolam (XANAX) 0.5 MG tablet Take 0.5 mg by mouth once daily as needed for Anxiety  aspirin 81 MG chewable tablet Take 81 mg by mouth.  atorvastatin (LIPITOR) 80 MG tablet Take 80 mg by mouth once daily  BELSOMRA 15 mg Tab Take 1 tablet by mouth at bedtime  brimonidine-timolol (COMBIGAN) 0.2-0.5 % ophthalmic solution 1 drop every twelve (12) hours.  brinzolamide (AZOPT) 1 % ophthalmic suspension 1 drop Three (3) times a day.  budesonide-formoterol (SYMBICORT) 160-4.5 mcg/actuation  inhaler Inhale 2 inhalations into the lungs 2 (two) times daily  carvediloL (COREG) 6.25 MG tablet Take 1 tablet by mouth 2 (two) times daily  cetirizine (ZYRTEC) 10 mg capsule Take 10 mg by mouth once daily  DULoxetine (CYMBALTA) 60 MG DR capsule Take 60 mg by mouth once daily  EPINEPHrine (EPIPEN) 0.3 mg/0.3 mL pen injector Inject 0.3 mg subcutaneously.  esomeprazole (NEXIUM) 40 MG DR capsule Take 40 mg by mouth once daily  fluticasone (FLONASE) 50 mcg/actuation nasal spray Place 2 sprays into both nostrils once daily as needed for Rhinitis or Allergies  FUROsemide (LASIX) 40 MG tablet TAKE ONE TABLET BY MOUTH TWICE DAILY 60 tablet 0  gabapentin (NEURONTIN) 300 MG capsule  HYDROcodone-acetaminophen (NORCO) 5-325 mg tablet Take 1 tablet by mouth every 8 (eight) hours as needed for Pain 15 tablet 0  isosorbide mononitrate (IMDUR) 60 MG ER tablet Take 60 mg by mouth once daily  JARDIANCE 25 mg tablet Take 25 mg by mouth once daily  lidocaine (LIDODERM) 5 % patch Place 2 patches onto the skin daily  meloxicam (MOBIC) 7.5 MG tablet Take 7.5 mg by mouth once daily  methocarbamoL (ROBAXIN) 500 MG tablet Take 500 mg by mouth 3 (three) times daily as needed  MYRBETRIQ 50 mg ER tablet Take 50 mg by mouth once daily  nicotine (NICODERM CQ) 14 mg/24 hr patch Apply one patch daily x 12 weeks. 28 patch 2  nicotine polacrilex (NICORETTE) 4 MG gum Chew gum a few times then place between cheek and gum. Use every 30 minutes as needed for smoking urges. Up to 10 a  day 100 each 2  OZEMPIC 0.25 mg or 0.5 mg (2 mg/3 mL) pen injector Inject 0.25 mg subcutaneously once a week  potassium chloride (KLOR-CON) 10 MEQ ER tablet Take by mouth.  solifenacin (VESICARE) 10 MG tablet Take 10 mg by mouth once daily.  tiZANidine (ZANAFLEX) 4 MG tablet Take 4 mg by mouth every 8 (eight) hours as needed for Muscle spasms  topiramate (TOPAMAX) 100 MG tablet Take 100 mg by mouth once daily  traMADoL (ULTRAM) 50 mg tablet Take 1  tablet (50 mg total) by mouth every 8 (eight) hours as needed for Pain 15 tablet 0  valsartan (DIOVAN) 40 MG tablet Take 40 mg by mouth once daily  zolpidem (AMBIEN) 10 mg tablet Take 10 mg by mouth at bedtime as needed for Sleep   Allergies:  Venom-Honey Bee Anaphylaxis  Penicillins Hives   Past Medical History:  History of chicken pox  Hyperlipidemia   Past Surgical History:  COLONOSCOPY 08/09/2021 (Diverticulosis/Otherwise normal colon/PHx CP/Repeat 61yrs/CTL)  EGD 08/09/2021 (Reflux esophagitis/Repeat as needed/CTL)  Reverse right total shoulder arthroplasty with biceps tenodesis. 08/02/2023 (Dr Joice Lofts)  ARTHROSCOPY SHOULDER Bilateral  CRANIOPLASTY FOR REPAIR SKULL DEFECT W/REPARATIVE BRAIN SURGERY  CRANIOTOMY FOR ANEURYSM REPAIR  JOINT REPLACEMENT Bilateral knee  KNEE ARTHROSCOPY   Family History:  High blood pressure (Hypertension) Maternal Grandmother   Social History:   Socioeconomic History:  Marital status: Single  Tobacco Use  Smoking status: Former  Smokeless tobacco: Never  Tobacco comments:  DIP SNUFF  Substance and Sexual Activity  Alcohol use: No  Alcohol/week: 0.0 standard drinks of alcohol  Drug use: Never  Sexual activity: Defer   Social Drivers of Health:   Physicist, medical Strain: Low Risk (11/30/2023)  Overall Financial Resource Strain (CARDIA)  Difficulty of Paying Living Expenses: Not hard at all  Food Insecurity: No Food Insecurity (11/30/2023)  Hunger Vital Sign  Worried About Running Out of Food in the Last Year: Never true  Ran Out of Food in the Last Year: Never true  Transportation Needs: No Transportation Needs (11/30/2023)  PRAPARE - Risk analyst (Medical): No  Lack of Transportation (Non-Medical): No   Review of Systems:  A comprehensive 14 point ROS was performed, reviewed, and the pertinent orthopaedic findings are documented in the HPI.  Physical Exam: Vitals:  01/18/24 0837 01/18/24 0839  BP:  124/78  Weight: (!) 108.9 kg (240 lb)  Height: 165.1 cm (5\' 5" )  PainSc: 9 9  PainLoc: Hip Hip   General/Constitutional: Pleasant significantly overweight middle-age female in no acute distress. Neuro/Psych: Normal mood and affect, oriented to person, place and time. Eyes: Non-icteric. Pupils are equal, round, and reactive to light, and exhibit synchronous movement. ENT: Unremarkable. Lymphatic: No palpable adenopathy. Respiratory: Lungs are clear to auscultation.  No wheezes and Non-labored breathing Cardiovascular: Regular rate and rhythm and without murmurs.  No edema, swelling or tenderness, except as noted in detailed exam. Integumentary: No impressive skin lesions present, except as noted in detailed exam. Musculoskeletal: Unremarkable, except as noted in detailed exam.  Right hip exam: Skin inspection of the right hip is unremarkable. No swelling, erythema, ecchymosis, abrasions, or other skin abnormalities are identified. She has mild tenderness to palpation over the lateral aspect of the hip. She is able to flex her hip to 90 degrees, internally rotate to 10 degrees, and externally rotate to 25 degrees, but experiences moderate to severe pain with all motions. She is grossly neurovascularly intact to the right lower extremity and foot.  X-rays/MRI/Lab data:  Recent x-rays of the pelvis and right hip are available for review and have been reviewed by myself. These films demonstrate moderate degenerative changes of the right hip as manifest by symmetric joint space narrowing with early osteophyte formation. No lytic lesions or fractures are identified.  Assessment: Primary osteoarthritis of right hip.   Plan: The treatment options were discussed with the patient and her daughter. In addition, patient educational materials were provided regarding the diagnosis and treatment options. The patient is quite frustrated by her symptoms and function limitations, and is ready to consider more  aggressive treatment options. Therefore, I have recommended a surgical procedure, specifically a right total hip arthroplasty. The procedure was discussed with the patient, as were the potential risks (including bleeding, infection, nerve and/or blood vessel injury, persistent or recurrent pain, loosening and/or failure of the components, dislocation, leg length inequality, need for further surgery, blood clots, strokes, heart attacks and/or arhythmias, pneumonia, etc.) and benefits. The patient states her understanding and wishes to proceed. All of the patient's questions and concerns were answered. She can call any time with further concerns. She will follow up post-surgery, routine.    H&P reviewed and patient re-examined. No changes.

## 2024-02-21 NOTE — Anesthesia Preprocedure Evaluation (Signed)
 Anesthesia Evaluation  Patient identified by MRN, date of birth, ID band Patient awake    Reviewed: Allergy & Precautions, H&P , NPO status , Patient's Chart, lab work & pertinent test results, reviewed documented beta blocker date and time   History of Anesthesia Complications Negative for: history of anesthetic complications  Airway Mallampati: II  TM Distance: >3 FB Neck ROM: Full    Dental  (+) Edentulous Upper, Missing, Dental Advidsory Given,    Pulmonary shortness of breath, asthma , sleep apnea and Continuous Positive Airway Pressure Ventilation , COPD,  COPD inhaler, neg recent URI, Not current smoker, former smoker   Pulmonary exam normal        Cardiovascular Exercise Tolerance: Poor hypertension, Pt. on medications and Pt. on home beta blockers (-) angina + CAD (09/11/2011: minor luminal irregulaties mLAD, mLCx, mRCA - med mgmt.), + Past MI, +CHF (Grade II DD) and + DOE  (-) Cardiac Stents (-) dysrhythmias + Valvular Problems/Murmurs (Nonrheumatic mitral valve regurgitation) MR  Rhythm:Regular Rate:Normal + Peripheral Edema Peripheral Edema  ECHO 02/2021: 1. Left ventricular ejection fraction, by estimation, is 60 to 65%. The  left ventricle has normal function. The left ventricle has no regional  wall motion abnormalities. Left ventricular diastolic parameters are  consistent with Grade II diastolic  dysfunction (pseudonormalization).  2. Right ventricular systolic function is normal. The right ventricular  size is normal. There is normal pulmonary artery systolic pressure. The  estimated right ventricular systolic pressure is 32.6 mmHg.  3. Left atrial size was mildly dilated.   Neuro/Psych  Headaches, neg Seizures PSYCHIATRIC DISORDERS Anxiety Depression    Cerebral aneurysm history of coils TIA Neuromuscular disease (Nueropathy) CVA (Pt unaware of CVAs, noted on scan)    GI/Hepatic Neg liver ROS,GERD   Medicated and Controlled,,  Endo/Other  diabetes, Type 2, Oral Hypoglycemic Agents  Class 3 obesity  Renal/GU Renal InsufficiencyRenal disease Bladder dysfunction      Musculoskeletal  (+) Arthritis ,    Abdominal  (+) + obese  Peds negative pediatric ROS (+)  Hematology negative hematology ROS (+)   Anesthesia Other Findings Glaucoma  PAT note reviewed  Per cardiology, "though Ms. Lantzy's functional capacity is limited, she can perform 4 METS of activity without chest pain or dyspnea. She can proceed with her shoulder surgery as planned, which is a low-risk procedure, without further cardiac testing or intervention  Reproductive/Obstetrics negative OB ROS                             Anesthesia Physical Anesthesia Plan  ASA: 3  Anesthesia Plan: Spinal   Post-op Pain Management:    Induction: Intravenous  PONV Risk Score and Plan: 3 and Treatment may vary due to age or medical condition, Ondansetron and Dexamethasone  Airway Management Planned: Natural Airway and Simple Face Mask  Additional Equipment:   Intra-op Plan:   Post-operative Plan:   Informed Consent: I have reviewed the patients History and Physical, chart, labs and discussed the procedure including the risks, benefits and alternatives for the proposed anesthesia with the patient or authorized representative who has indicated his/her understanding and acceptance.       Plan Discussed with: CRNA and Anesthesiologist  Anesthesia Plan Comments:         Anesthesia Quick Evaluation

## 2024-02-21 NOTE — Discharge Instructions (Addendum)
 Instructions after Total Hip Replacement     J. Derald Macleod, M.D.  Valeria Batman, PA-C     Dept. of Orthopaedics & Sports Medicine  [facility]  [address]  [place], [place]  [address]  Phone: [phone]   Fax: [phone]    DIET: Drink plenty of non-alcoholic fluids. Resume your normal diet. Include foods high in fiber.  ACTIVITY:  You may use crutches or a walker with weight-bearing as tolerated, unless instructed otherwise. You may be weaned off of the walker or crutches by your Physical Therapist.  Do NOT reach below the level of your knees or cross your legs until allowed.    Continue doing gentle exercises. Exercising will reduce the pain and swelling, increase motion, and prevent muscle weakness.   Please continue to use the TED compression stockings for 6 weeks. You may remove the stockings at night, but should reapply them in the morning. Do not drive or operate any equipment until instructed.  WOUND CARE:  Continue to use ice packs periodically to reduce pain and swelling. Keep the incision clean and dry.  Prevena will be changed to a dry dressing in 7-10 days by HHPT. You may bathe or shower after the staples are removed at the first office visit following surgery.  MEDICATIONS: You may resume your regular medications. Please take the pain medication as prescribed on the medication. Do not take pain medication on an empty stomach. You have been given a prescription for a blood thinner to prevent blood clots. Please take the medication as instructed. (NOTE: After completing a 2 week course of Aspirin, take one Enteric-coated aspirin once a day.) Pain medications and iron supplements can cause constipation. Use a stool softener (Senokot or Colace) on a daily basis and a laxative (dulcolax or miralax) as needed. Do not drive or drink alcoholic beverages when taking pain medications.  CALL THE OFFICE FOR: Temperature above 101 degrees Excessive bleeding  or drainage on the dressing. Excessive swelling, coldness, or paleness of the toes. Persistent nausea and vomiting.  FOLLOW-UP:  You should have an appointment to return to the office in 2 weeks after surgery. Arrangements have been made for continuation of Physical Therapy (either home therapy or outpatient therapy).   Transportation Resources  Agency Name: [facility] Agency Address: [address] [place], [place] [address] Phone: [phone] Email: [email or web address] Website: [email or web address] Service(s) Offered: Housing services, self-sufficiency, congregate meal program, weatherization program, Field seismologist program, emergency food assistance,  housing counseling, home ownership program, wheels-towork program.  Agency Name: [facility] [employer] [phone]) Address: [address], [place], [place] [address] Phone: [phone] Website: [email or web address].[email or web address] Service(s) Offered: Transportation for [employer], subscription and demand response; Dial-a-Ride for citizens 71 years of age or older.  Agency Name: Department of Social Services Address: [address] [place], [place] [address] Phone: [phone] Service(s) Offered: Child support services; child welfare services; food stamps; Medicaid; work first family assistance; and aid with fuel,  rent, food and medicine, transportation assistance.  Agency Name: Disabled [employer] (DAV) Transportation  Network Phone: [phone] Service(s) Offered: Transports veterans to the [facility] medical center. Call  forty-eight hours in advance and leave the name, telephone  number, date, and time of appointment. Veteran will be  contacted by the driver the day before the appointment to  arrange a pick up point   Transportation Resources  Agency Name: [facility] Agency Address: [address] [place], [place]  [address] Phone: [phone] Email: [email or web address]  Website: [email or web address] Service(s) Offered: Housing services, self-sufficiency, congregate meal program, weatherization program, Field seismologist program, emergency food assistance,  housing counseling, home ownership program, wheels-towork program.  Agency Name: [facility] [employer] [phone]) Address: [address], [place], [place] [address] Phone: [phone] Website: [email or web address].[email or web address] Service(s) Offered: Transportation for [employer], subscription and demand response; Dial-a-Ride for citizens 10 years of age or older.  Agency Name: Department of Social Services Address: [address] [place], [place] [address] Phone: [phone] Service(s) Offered: Child support services; child welfare services; food stamps; Medicaid; work first family assistance; and aid with fuel,  rent, food and medicine, transportation assistance.  Agency Name: Disabled [employer] (DAV) Transportation  Network Phone: [phone] Service(s) Offered: Transports veterans to the [facility] medical center. Call  forty-eight hours in advance and leave the name, telephone  number, date, and time of appointment. Veteran will be  contacted by the driver the day before the appointment to  arrange a pick up point    [employer] ACTA currently provides door to door services. ACTA connects with PART daily for services to [facility]. ACTA also performs contract services to [employer] operates 27 vehicles, all but 3 mini-vans are equipped with lifts for special needs as well as the general public. ACTA drivers are each CDL certified and trained in First Aid and CPR. ACTA was established in 2002 by [employer]. An independent Industrial/product designer. ACTA operates via Cytogeneticist with required local  10% match funding from [place]. ACTA provides over 80,000 passenger trips each year, including [facility] and [employer] sites.  Call at least by 11 AM one business day prior to needing transportation  [employer].                      [place], [place] [address]     Office Hours: Monday-Friday  8 AM - 5 PM   Food Resources  Agency Name: [facility] Agency Address: [address] [place], [place] [address] Phone: [phone] Website: www.[email or web address] Service(s) Offered: Housing services, self-sufficiency, congregate meal program, weatherization program, Event organiser program, emergency food assistance,  housing counseling, home ownership program, wheels - to work program.  [employer] free for 60 and older at various locations from [employer], Monday-Friday:  [employer], [address] [place], [phone] -[facility], [address] [place] [phone]  -[facility], [address] [place] [phone]  -[address] [place], [phone]  Agency Name: [facility] Address: [phone] [address], [place], [place] [address] Phone: [phone] Website: www.[email or web address] Service(s) Offered: Home delivered hot, frozen, and emergency  meals. Grocery assistance program which matches  volunteers one-on-one with seniors unable to grocery shop  for themselves. Must be 60 years and older; less than 20  hours of in-home aide service, limited or no driving ability;  live alone or with someone with a disability; live in  [place].  Agency Name: [facility]ologist [facility]) Address: [address] [place], [place] [address] Phone: [phone] Service(s) Offered: Food is served to shut-ins, homeless, elderly, and low income people in the community every Saturday (11:30 am-12:30 pm) and Sunday (12:30  pm-1:30pm). Volunteers also offer help and encouragement in seeking employment,  and spiritual guidance.  Agency Name: Department of Social Services Address: [address] [place], [place] [address] Phone: [phone]  Service(s) Offered: Child support services; child welfare services; food stamps; Medicaid; work first family assistance; and aid with fuel,  rent, food and medicine.  Agency Name: Dietitian Address: [address] [place], [place] Phone: [phone] Website: www.[email or web address] Services Offered: Monday 10:00am-12:00, 8:00pm-9:00pm, and Friday 10:00am-12:00.  Agency Name: [employer] [facility] Address: [address] [place], [place] [address] Phone: [phone] Website: www.[email or web address] Service(s) Offered: Serves weekday meals, open from 11:30 am- 1:00 pm., and 6:30-7:30pm, Monday-Wednesday-Friday distributes food 3:30-6pm, Monday-Wednesday-Friday.  Agency Name: [facility] Address: [address] [place], [place] Phone: [phone] Website: [email or web address] Services Offered: Distributes food the 4th Saturday of the month, starting at 8:00 am  Agency Name: [facility] Address: [phone] [address] [place], [place] [address] Phone: [phone] Website: [email or web address] Service(s) Offered: Bread of life, weekly food pantry. Open Wednesdays from 10:00am-noon.  Agency Name: [facility] [employer] Address: [address] [place], [place], [place] Phone: [phone] Services Offered: Distributes food 9am-1pm, Monday-Thursday. Call for details.  Agency Name: [facility] Address: [address] [place], [place] [address] Phone: [phone] Website: firstbaptistburlington.[email or web address] Service(s) Offered: Games developer. Call for assistance.  Agency Name: [facility] Address: [address], [place], [place]  [address] Phone: [phone] Service Offered: Emergency Food Pantry. Call for appointment.  Agency Name: [facility] Address: [address], [place], [place] [address] Phone: [phone] Website: [email or web address] Services Offered: Games developer. Call for details  Agency Name: [facility] Address: [address] [place], [place] Phone: [phone] Website: [email or web address] Service(s) Offered: Emergency Food Pantry. Call for details.  Agency Name: Holiday representative Address: [address] [place], [place] [address] Phone: [phone] or [phone] Website: [email or web address] Service(s) Offered: Distribute food 9am-11:30 am, Tuesday-Friday, and 1-3:30pm, Monday-Friday. Food pantry Monday-Friday 1pm-3pm, fresh items, Mon.-Wed.-Fri.  Agency Name: [facility] Address: [address] [place], [place] [address] Phone: [phone] Website: www.safealamance.org Services Offered: Distribute food Tues and Sats from 9:00am-noon. Closed 1st Saturday of each month. Call for details  Agency Name: [employer] [employer] Address: [address] [facility] [address] [place] [address] Phone: [phone]  Services Offered: Delivers meals every Thursday.  Rent/Utility/Housing  Agency Name: [facility] Agency Address: [address] [place], [place] [address] Phone: [phone] Email: [email or web address] Website: [email or web address] Service(s) Offered: Housing services, self-sufficiency, congregate meal program, weatherization program, Field seismologist program, emergency food assistance,  housing counseling, home ownership program, wheels -towork program.  Agency Name: [facility] Address: [address], [place], [place] [address] Phone: [phone] (8a-4p) [phone] (8p- 10p) Email: [email or web address] Website:  [email or web address] Service(s) Offered: A program for homeless and/or needy men that includes one-on-one counseling, life skills training and job rehabilitation.  Agency Name: [employer] [facility] [place] Address: [address] [place], [place] [address] Phone: [phone] Website: www.[email or web address] Service(s) Offered: Assistance to needy in emergency with utility bills, heating fuel, and prescriptions. Shelter for homeless 7pm-7am. March 08, 2017 15  Agency Name: [facility] [place] (Developmentally Disabled) Address: [address], [place], [place] [address] Phone: [phone] Contact Person: Cathleen Corti Email: [email or web address] Website: [email or web address] Service(s) Offered: Helps individuals with developmental disabilities move from housing that is more restrictive to homes where they  can achieve greater independence and have more  opportunities.  Agency Name: [facility] Address: [address], [place], [place] [address] Phone: [phone] Email: [email or web address] Website: www.[email or web address].org Service(s) Offered: Provides affordable housing  for low-income families, elderly, and disabled individuals. Offer a wide range of  programs and services, from financial planning to afterschool and summer programs.  Agency Name: Department of Social Services Address: [address] [place], [place] [address] Phone: [phone] Service(s) Offered: Child support services; child welfare services; food stamps; Medicaid; work first family assistance; and aid with fuel,  rent, food and medicine.  Agency Name: [facility] [place] [employer]. Address: [facility] [address] [place], [place]  [address] Phone: [phone] Website: www.familyabuseservices.org Service(s) Offered: 24 hour Crisis Line: [phone]; 24 hour Emergency Shelter; Transitional Housing; Support Groups; Scientist, physiological; [employer]; Hispanic Outreach: [phone];  Visitation Center: [phone].  Agency Name: [facility] [place]. Address: [address] [place], [place] [address] Phone: [phone] Service(s) Offered: CAP Services; Home and [employer]; Individual or Group Supports; Respite Care Non-Institutional Nursing;  Residential Supports; Respite Care and Personal Care Services; Transportation; Family and Friends Night; Recreational Activities; Three Nutritious Meals/Snacks; Consultation with Registered Dietician; Twenty-four hour Registered Nurse Access; Daily and [employer]s; Camp Green Leaves; [place] for the [employer] (During Summer Months) Bingo Night (Every  Wednesday Night); Special Populations Dance Night  (Every Tuesday Night); Professional Hair Care Services.  Agency Name: [facility] Address: [address], [place], [place] [address] Phone: [phone] Contact Person: Richardo Chandler Website: [email or web address].Physicist, medical) Offered: Residential treatment facility for women; food and  clothing, educational & employment development and  transportation to work; Counsellor of financial skills;  parenting and family reunification; emotional and spiritual  support; transitional housing for program graduates.  Agency Name: [facility] Address: [address] [place], [place] [address] Phone: [phone] Email: [email or web address] Website: [email or web address].es Service(s) Offered: Public housing units for elderly, disabled, and low income people; housing choice vouchers for income eligible  applicants; shelter plus care vouchers; and Psychologist, clinical.  Agency Name: [facility] [employer] Address: [address] [place], [place] [address] Phone: [phone] Email: [email or web address] Website: www.habitatalamance.org Service(s) Offered: Build houses for families in need of  decent housing. Each adult in the family must invest 200 hours of labor on  someone else's house, work with volunteers to build their own house, attend classes on budgeting, home maintenance, yard care, and attend homeowner association meetings.  Agency Name: [facility] Address: [address] [place], [place] [address] Phone: [phone] Website: www.rsli.org Service(s) Offered: Intermediate care facilities for intellectually delayed, Supervised Living in group homes for adults with developmental disabilities, Supervised Living for people who have dual diagnoses (MRMI), Independent Living, Supported Living, respite and a variety of CAP services, pre-vocational services, day supports, and [employer].  Agency Name: [place] [employer] Phone: [phone] Website: www.NCForeclosurePrevention.gov Service(s) Offered: Zero-interest, deferred loans to homeowners struggling to pay their mortgage. Call for more information.

## 2024-02-21 NOTE — Anesthesia Procedure Notes (Signed)
 Spinal  Patient location during procedure: OR Start time: 02/21/2024 10:45 AM End time: 02/21/2024 11:00 AM Reason for block: surgical anesthesia Staffing Performed: resident/CRNA  Resident/CRNA: Maryla Morrow., CRNA Performed by: Maryla Morrow., CRNA Authorized by: Lenard Simmer, MD   Preanesthetic Checklist Completed: patient identified, IV checked, site marked, risks and benefits discussed, surgical consent, monitors and equipment checked, pre-op evaluation and timeout performed Spinal Block Patient position: sitting Prep: Betadine Patient monitoring: heart rate, continuous pulse ox, blood pressure and cardiac monitor Approach: midline Location: L4-5 Injection technique: single-shot Needle Needle type: Whitacre and Introducer  Needle gauge: 24 G Needle length: 9 cm Assessment Events: CSF return Additional Notes Negative paresthesia. Negative blood return. Positive free-flowing CSF. Expiration date of kit checked and confirmed. Patient tolerated procedure well, without complications.

## 2024-02-21 NOTE — Op Note (Signed)
 02/21/2024  1:34 PM  Patient:   Kristine Rangel  Pre-Op Diagnosis:   Degenerative joint disease, right hip.  Post-Op Diagnosis:   Same.  Procedure:   Right total hip arthroplasty.  Surgeon:   Maryagnes Amos, MD  Assistant:   Horris Latino, PA-C; Martie Round, PA-S  Anesthesia:   Spinal  Findings:   As above.  Complications:   None  EBL:   150 cc  Fluids:   500 cc crystalloid  UOP:   480 cc  TT:   None  Drains:   None  Closure:   Staples  Implants:   Biomet press-fit system with a # 12 laterally offset Echo femoral stem, a 48 mm acetabular shell with an E-poly hi-wall liner, and a 32 mm ceramic head with a -6 mm neck adapter.  Brief Clinical Note:   The patient is a 68 year old female with a history of progressively worsening right hip/groin pain. Her symptoms have progressed despite medications, activity modification, etc. Her history and examination consistent with a vas degenerative joint disease confirmed by plain radiographs. The patient presents at this time for a right total hip arthroplasty.   Procedure:   The patient was brought into the operating room. After adequate spinal anesthesia was obtained, a Foley catheter was placed. The patient was repositioned in the left lateral decubitus position and secured using a lateral hip positioner. The right hip and lower extremity were prepped with ChloroPrep solution before being draped sterilely. Preoperative antibiotics were administered. A timeout was performed to verify the appropriate surgical site.    A standard posterior approach to the hip was made through an approximately 9-10 inch incision. The incision was carried down through the subcutaneous tissues to expose the gluteal fascia and proximal end of the iliotibial band. These structures were split the length of the incision and the Charnley self-retaining hip retractor placed. The bursal tissues were swept posteriorly to expose the short external rotators. The  anterior border of the piriformis tendon was identified and this plane developed down through the capsule to enter the joint. A flap of tissue was elevated off the posterior aspect of the femoral neck and greater trochanter and retracted posteriorly. This flap included the piriformis tendon, the short external rotators, and the posterior capsule. The soft tissues were elevated off the lateral aspect of the ilium and a large Steinmann pin placed bicortically.   With the right leg aligned over the left, a drill bit was placed into the greater trochanter parallel to the Steinmann pin and the distance between these two pins measured in order to optimize leg lengths postoperatively. The drill bit was removed and the hip dislocated. The piriformis fossa was debrided of soft tissues before the intramedullary canal was accessed through this point using a triple step reamer. The canal was reamed sequentially beginning with a #7 tapered reamer and progressing to a # 12 tapered reamer. This provided excellent circumferential chatter. Using the appropriate guide, a femoral neck cut was made 10-12 mm above the lesser trochanter. The femoral head was removed.  Attention was directed to the acetabular side. The labrum was debrided circumferentially before the ligamentum teres was removed using a large curette. A line was drawn on the drapes corresponding to the native version of the acetabulum. This line was used as a guide while the acetabulum was reamed sequentially beginning with a 43 mm reamer and progressing to a 47 mm reamer. This provided excellent circumferential chatter. The 47 mm trial acetabulum was  positioned and found to fit quite well. Therefore, the 48 mm acetabular shell was selected and impacted into place with care taken to maintain the appropriate version. The trial high wall liner was inserted.  Attention was redirected to the femoral side. A box osteotome was used to establish version before the canal  was broached sequentially beginning with a #7 broach and progressing to a #12 broach. This was left in place and several trial reductions performed using both the standard and laterally offset neck options, as well as the -6 mm and -3 mm neck lengths. After removing the trial components, the "manhole cover" was placed into the apex of the acetabular shell and tightened securely. The permanent E-polyethylene hi-wall liner was impacted into the acetabular shell and its locking mechanism verified using a quarter-inch osteotome. Next, the #12 laterally offset femoral stem was impacted into place with care taken to maintain the appropriate version. A repeat trial reduction was performed using the -6 mm and -3 mm neck lengths. The 3 mm option proved to be too tight in extension whereas the -6 mm neck length demonstrated excellent stability both in extension and external rotation as well as with flexion to 90 and internal rotation beyond 70. It also was stable in the position of sleep. In addition, leg lengths appeared to be restored appropriately, both by reassessing the position of the right leg over the left, as well as by measuring the distance between the Steinmann pin and the drill bit. The 32 mm ceramic head with the -6 mm neck adapter was impacted onto the stem of the femoral component. The Morse taper locking mechanism was verified using manual distraction before the head was relocated and placed through a range of motion with the findings as described above.  The wound was copiously irrigated with sterile saline solution via the jet lavage system as well as 500 cc of Irrisept. The peri-incisional and pericapsular tissues were injected with a "cocktail" of 20 cc of Exparel, 30 cc of 0.5% Sensorcaine, 2 cc of Kenalog 40 (80 mg), and 30 mg of Toradol diluted out to 90 cc with normal saline to help with postoperative analgesia. The posterior flap was reapproximated to the posterior aspect of the greater  trochanter using #2 Tycron interrupted sutures placed through drill holes. Several additional #2 Tycron interrupted sutures were used to reinforce this layer of closure. The iliotibial band was reapproximated using #1 Vicryl interrupted sutures before the gluteal fascia was closed using a running #0 Vicryl suture. The subcutaneous tissues were closed in several layers using 2-0 Vicryl interrupted sutures before the skin was closed using staples.  Prior to wound closure, 1 g of vancomycin powder was sprinkled around the joint and in the subcutaneous tissues. A sterile occlusive Prevena wound VAC dressing was applied to the wound. The patient was then rolled back into the supine position on her hospital bed before being awakened and returned to the recovery room in satisfactory condition after tolerating the procedure well.

## 2024-02-22 ENCOUNTER — Other Ambulatory Visit: Payer: Self-pay

## 2024-02-22 ENCOUNTER — Encounter: Payer: Self-pay | Admitting: Surgery

## 2024-02-22 DIAGNOSIS — M1611 Unilateral primary osteoarthritis, right hip: Secondary | ICD-10-CM | POA: Diagnosis not present

## 2024-02-22 LAB — BASIC METABOLIC PANEL WITH GFR
Anion gap: 7 (ref 5–15)
BUN: 24 mg/dL — ABNORMAL HIGH (ref 8–23)
CO2: 24 mmol/L (ref 22–32)
Calcium: 9.2 mg/dL (ref 8.9–10.3)
Chloride: 106 mmol/L (ref 98–111)
Creatinine, Ser: 1.26 mg/dL — ABNORMAL HIGH (ref 0.44–1.00)
GFR, Estimated: 46 mL/min — ABNORMAL LOW (ref >60)
Glucose, Bld: 146 mg/dL — ABNORMAL HIGH (ref 70–99)
Potassium: 4 mmol/L (ref 3.5–5.1)
Sodium: 137 mmol/L (ref 135–145)

## 2024-02-22 LAB — CBC
HCT: 31.3 % — ABNORMAL LOW (ref 36.0–46.0)
Hemoglobin: 10.3 g/dL — ABNORMAL LOW (ref 12.0–15.0)
MCH: 27.8 pg (ref 26.0–34.0)
MCHC: 32.9 g/dL (ref 30.0–36.0)
MCV: 84.4 fL (ref 80.0–100.0)
Platelets: 294 10*3/uL (ref 150–400)
RBC: 3.71 MIL/uL — ABNORMAL LOW (ref 3.87–5.11)
RDW: 15.1 % (ref 11.5–15.5)
WBC: 12.5 10*3/uL — ABNORMAL HIGH (ref 4.0–10.5)
nRBC: 0 % (ref 0.0–0.2)

## 2024-02-22 MED ORDER — APIXABAN 2.5 MG PO TABS: 2.5000 mg | ORAL_TABLET | Freq: Two times a day (BID) | ORAL | 0 refills | Status: DC

## 2024-02-22 MED ORDER — ACETAMINOPHEN 500 MG PO TABS: 1000.0000 mg | ORAL_TABLET | Freq: Four times a day (QID) | ORAL | 0 refills | Status: AC

## 2024-02-22 MED ORDER — MUPIROCIN 2 % EX OINT: 1.0000 | TOPICAL_OINTMENT | Freq: Two times a day (BID) | CUTANEOUS | 0 refills | Status: AC

## 2024-02-22 MED ORDER — OXYCODONE HCL 5 MG PO TABS
5.0000 mg | ORAL_TABLET | ORAL | 0 refills | Status: DC | PRN
Start: 2024-02-22 — End: 2024-07-02

## 2024-02-22 MED ORDER — CHLORHEXIDINE GLUCONATE 4 % EX SOLN: 1.0000 | CUTANEOUS | 1 refills | Status: DC

## 2024-02-22 MED ORDER — ONDANSETRON HCL 4 MG PO TABS: 4.0000 mg | ORAL_TABLET | Freq: Four times a day (QID) | ORAL | 0 refills | Status: AC | PRN

## 2024-02-22 NOTE — Anesthesia Postprocedure Evaluation (Signed)
 Anesthesia Post Note  Patient: [name]  Procedure(s) Performed: ARTHROPLASTY, HIP, TOTAL,POSTERIOR APPROACH (Right: Hip)  Patient location during evaluation: Nursing Unit Anesthesia Type: Spinal Level of consciousness: oriented and awake and alert Pain management: pain level controlled Vital Signs Assessment: post-procedure vital signs reviewed and stable Respiratory status: spontaneous breathing and respiratory function stable Cardiovascular status: blood pressure returned to baseline and stable Postop Assessment: no headache, no backache, no apparent nausea or vomiting and patient able to bend at knees Anesthetic complications: no   There were no known notable events for this encounter.   Last Vitals:  Vitals:   02/22/24 0552 02/22/24 0555  BP: (!) 89/62 (!) 97/46  Pulse: (!) 56   Resp: 16   Temp: 36.5 C   SpO2: 96%     Last Pain:  Vitals:   02/22/24 0559  TempSrc:   PainSc: 7                  Rosanne Gutting

## 2024-02-22 NOTE — Plan of Care (Signed)
 Documented

## 2024-02-22 NOTE — Evaluation (Signed)
 Physical Therapy Evaluation Patient Details Name: Kristine Rangel MRN: [id number] DOB: 1956/10/17 Today's Date: 02/22/2024  History of Present Illness  Pt is 68 yo female s/p R THA on 02/21/24. PMH of asthma, COPD, former smoker, past MI, HTN, anxiety. depression, CVA, s/p brain surgery (anuerysm repair), DMII, renal disease, CHF, rotator cuff surgery, glaucoma, R TKA revision, R TSA 08/02/23.   Clinical Impression  Pt received in Semi-Fowler's position and agreeable to therapy.  Pt noted to have weakness in the R LE and had increased difficulty navigating it to the side of the bed in order to sit upright.  Pt needed assistance with the R LE sliding in the bed.  Pt then was able to stand with minA at the EOB.  Pt performed weight shifts and marching before attempting ambulation.  Pt was able to ambulate ~25 feet before fatiguing and requiring seated rest break in the recliner.  Pt still very weak and noting increased pain of 8/10 at rest and with mobilization.  Pt wheeled back into the room with the recliner and was left with all needs met.  Pt lives alone at this time, but has a PCA that comes M-F from 9-4 and the Weekend from 9-11, but is able to come more often and stay later if necessary.  Pt is wanting to return home.        If plan is discharge home, recommend the following: A lot of help with walking and/or transfers;A lot of help with bathing/dressing/bathroom;Assist for transportation;Help with stairs or ramp for entrance   Can travel by private vehicle        Equipment Recommendations  (Bariatric rolling walker)  Recommendations for Other Services       Functional Status Assessment Patient has had a recent decline in their functional status and demonstrates the ability to make significant improvements in function in a reasonable and predictable amount of time.     Precautions / Restrictions Precautions Precautions: Posterior Hip Precaution Booklet Issued: Yes (comment)       Mobility  Bed Mobility Overal bed mobility: Needs Assistance Bed Mobility: Supine to Sit     Supine to sit: Min assist     General bed mobility comments: Pt required verbal cues for proper set up and was able to perform with minA    Transfers Overall transfer level: Needs assistance Equipment used: Rolling walker (2 wheels) Transfers: Sit to/from Stand Sit to Stand: Min assist           General transfer comment: minA to come upright into standing position    Ambulation/Gait Ambulation/Gait assistance: Contact guard assist Gait Distance (Feet): 25 Feet Assistive device: Rolling walker (2 wheels) Gait Pattern/deviations: WFL(Within Functional Limits), Wide base of support Gait velocity: significantly reduced     General Gait Details: Pt with slowed and effortful ambulation.  Pt normally ambulated ~50' at a given time, but not much more than that.  Stairs            Wheelchair Mobility     Tilt Bed    Modified Rankin (Stroke Patients Only)       Balance Overall balance assessment: Mild deficits observed, not formally tested                                           Pertinent Vitals/Pain Pain Assessment Pain Assessment: 0-10 Pain Score: 8  Pain Location:  R Hip Pain Descriptors / Indicators: Sharp Pain Intervention(s): Limited activity within patient's tolerance, Monitored during session, Premedicated before session, Repositioned    Home Living Family/patient expects to be discharged to:: Private residence Living Arrangements: Alone Available Help at Discharge: Personal care attendant (M-F 9-4; Weekends 9-11) Type of Home: Apartment Home Access: Level entry       Home Layout: One level Home Equipment: BSC/3in1;Cane - single point;Rollator (4 wheels);Shower seat Additional Comments: Home information above for caregiver's home where pt will be staying for 9 weeks during recovery. Caregiver is having an adjustable bed  delivered.    Prior Function Prior Level of Function : Needs assist             Mobility Comments: Pt primarily ambulating short distances with rollator; no falls in past 6 months ADLs Comments: Caregiver/aide assists daily with bathing, dressing, cooking, cleaning. Pt indep with med mgt.     Extremity/Trunk Assessment   Upper Extremity Assessment Upper Extremity Assessment: Overall WFL for tasks assessed    Lower Extremity Assessment Lower Extremity Assessment: Generalized weakness;RLE deficits/detail RLE Deficits / Details: weakness s/p R THA.       Communication   Communication Communication: No apparent difficulties    Cognition Arousal: Alert Behavior During Therapy: WFL for tasks assessed/performed   PT - Cognitive impairments: No apparent impairments                                 Cueing Cueing Techniques: Verbal cues     General Comments      Exercises     Assessment/Plan    PT Assessment Patient needs continued PT services  PT Problem List Decreased strength;Decreased range of motion;Decreased activity tolerance;Decreased balance;Decreased mobility;Decreased knowledge of use of DME;Decreased safety awareness       PT Treatment Interventions DME instruction;Gait training;Stair training;Functional mobility training;Therapeutic activities;Therapeutic exercise;Neuromuscular re-education;Balance training    PT Goals (Current goals can be found in the Care Plan section)  Acute Rehab PT Goals Patient Stated Goal: to get stronger and get back home with PCA. PT Goal Formulation: With patient Time For Goal Achievement: 03/07/24 Potential to Achieve Goals: Good    Frequency BID     Co-evaluation               AM-PAC PT "6 Clicks" Mobility  Outcome Measure Help needed turning from your back to your side while in a flat bed without using bedrails?: A Little Help needed moving from lying on your back to sitting on the side of a flat  bed without using bedrails?: A Little Help needed moving to and from a bed to a chair (including a wheelchair)?: A Little Help needed standing up from a chair using your arms (e.g., wheelchair or bedside chair)?: A Little Help needed to walk in hospital room?: A Lot Help needed climbing 3-5 steps with a railing? : Total 6 Click Score: 15    End of Session Equipment Utilized During Treatment: Gait belt Activity Tolerance: Patient tolerated treatment well;Patient limited by fatigue Patient left: in chair Nurse Communication: Mobility status PT Visit Diagnosis: Unsteadiness on feet (R26.81);Other abnormalities of gait and mobility (R26.89);Muscle weakness (generalized) (M62.81);Difficulty in walking, not elsewhere classified (R26.2)    Time: 6213-0865 PT Time Calculation (min) (ACUTE ONLY): 39 min   Charges:   PT Evaluation $PT Eval Moderate Complexity: 1 Mod PT Treatments $Therapeutic Activity: 23-37 mins PT General Charges $$ ACUTE PT VISIT:  1 Visit         Nolon Bussing, PT, DPT Physical Therapist - [facility]  02/22/24, 1:30 PM

## 2024-02-22 NOTE — Progress Notes (Signed)
 Subjective: 1 Day Post-Op Procedure(s) (LRB): ARTHROPLASTY, HIP, TOTAL,POSTERIOR APPROACH (Right) Patient reports pain as 7 on 0-10 scale.   Patient is well, and has had no acute complaints or problems Plan is to go Home after hospital stay. Negative for chest pain and shortness of breath Fever: no Gastrointestinal:Negative for nausea and vomiting Foley has been removed. Patient is passing gas.  Objective: Vital signs in last 24 hours: Temp:  [97 F (36.1 C)-98.1 F (36.7 C)] 97.7 F (36.5 C) (04/11 0552) Pulse Rate:  [56-86] 56 (04/11 0552) Resp:  [15-22] 16 (04/11 0552) BP: (89-141)/(46-95) 97/46 (04/11 0555) SpO2:  [96 %-100 %] 96 % (04/11 0552)  Intake/Output from previous day:  Intake/Output Summary (Last 24 hours) at 02/22/2024 0736 Last data filed at 02/22/2024 0600 Gross per 24 hour  Intake 1682.5 ml  Output 2930 ml  Net -1247.5 ml    Intake/Output this shift: No intake/output data recorded.  Labs: Recent Labs    02/19/24 1047 02/22/24 0531  HGB 12.6 10.3*   Recent Labs    02/19/24 1047 02/22/24 0531  WBC 4.7 12.5*  RBC 4.62 3.71*  HCT 38.4 31.3*  PLT 359 294   Recent Labs    02/19/24 1047 02/22/24 0531  NA 138 137  K 4.2 4.0  CL 103 106  CO2 26 24  BUN 12 24*  CREATININE 1.08* 1.26*  GLUCOSE 77 146*  CALCIUM 10.0 9.2   No results for input(s): "LABPT", "INR" in the last 72 hours.   EXAM General - Patient is Alert, Appropriate, and Oriented Extremity - ABD soft Neurovascular intact Dorsiflexion/Plantar flexion intact Incision: Prevena intact without drainage No cellulitis present Compartment soft Dressing/Incision - Prevena intact with good seal. Motor Function - intact, moving foot and toes well on exam.  Abdomen soft with intact bowel sounds.  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   Bladder leak    Cardiac murmur    Chest pain    a.) 10/07/2008 Neg Dob[place]atmine Echo; b.) LHC 09/11/2011 - minor lum irregs; no sig CAD; c.) MV 06/02/2015: no ischemia; d.) cCTA 02/2021: Ca2+ = 0. Normal coronaries   CKD (chronic kidney disease), stage III (HCC)    COPD (chronic obstructive pulmonary disease) (HCC)    Depression    Diet-controlled type 2 diabetes mellitus (HCC)    Dyspnea    Essential hypertension    GERD (gastroesophageal reflux disease)    Glaucoma    History of kidney stones    History of left heart catheterization (LHC) 09/11/2011   a.) LHC 09/11/2011: minor luminal irregulaties mLAD, mLCx, mRCA - med mgmt.   Hyperlipidemia    Insomnia    a.) takes suvorexant   Long term current use of aspirin    MI (myocardial infarction) (HCC)    a.) in the 1990s; details unclear   Migraine with aura    Neuropathy    OSA on CPAP    Rotator cuff injury    Seasonal allergies  Tendinitis of right rotator cuff    TIA (transient ischemic attack)     Assessment/Plan: 1 Day Post-Op Procedure(s) (LRB): ARTHROPLASTY, HIP, TOTAL,POSTERIOR APPROACH (Right) Principal Problem:   Status post total hip replacement, right  Estimated body mass index is 47.47 kg/m as calculated from the following:   Height as of 02/19/24: 5\' 3"  (1.6 m).   Weight as of 02/19/24: 121.6 kg. Advance diet Up with therapy D/C IV fluids when tolerating po intake.  Labs and vitals reviewed. WBC 12.5 this AM, Hg 10.3. BP soft, continue to monitor.  Encourage increased oral intake. Up with PT today. Plan for possible d/c home today pending progress with  PT.  Discharge home with Prevena, change to dry dressing in 7-10 days.  DVT Prophylaxis - Foot Pumps and Eliquis Weight-Bearing as tolerated to right leg  J. Horris Latino, PA-C [facility] 02/22/2024, 7:36 AM

## 2024-02-22 NOTE — Progress Notes (Signed)
 Physical Therapy Treatment Patient Details Name: Kristine Rangel MRN: [id number] DOB: 03-Sep-1956 Today's Date: 02/22/2024   History of Present Illness Pt is 68 yo female s/p R THA on 02/21/24. PMH of asthma, COPD, former smoker, past MI, HTN, anxiety. depression, CVA, s/p brain surgery (anuerysm repair), DMII, renal disease, CHF, rotator cuff surgery, glaucoma, R TKA revision, R TSA 08/02/23.    PT Comments  Pt received in Semi-Fowler's position and agreeable to therapy.  Pt able to recall precautions upon entering into the room with good carryover.  Pt noted that she would be transferring to a different room soon and would prefer to stay in the bed so that she does not miss the transfer.  Pt able to perform bed-level exercises, some with AAROM necessary for the full ROM on the R LE.  Pt does still exhibit reduced strength in the R LE as expected, but pt is unable to perform SLR at this time on the R LE.  Pt encouraged to continue to perform the exercises as outlined in the HEP.  Pt verbalized agreement.  Pt left with all needs met and call bell within reach.      If plan is discharge home, recommend the following: A lot of help with walking and/or transfers;A lot of help with bathing/dressing/bathroom;Assist for transportation;Help with stairs or ramp for entrance   Can travel by private vehicle        Equipment Recommendations   (Bariatric rolling walker)    Recommendations for Other Services       Precautions / Restrictions Precautions Precautions: Posterior Hip Precaution Booklet Issued: Yes (comment)     Mobility  Bed Mobility Overal bed mobility: Needs Assistance Bed Mobility: Supine to Sit     Supine to sit: Min assist     General bed mobility comments: deferred mobility due to the pt being transferred soon to the floor.    Transfers Overall transfer level: Needs assistance Equipment used: Rolling walker (2 wheels) Transfers: Sit to/from Stand Sit to Stand: Min assist            General transfer comment: minA to come upright into standing position    Ambulation/Gait Ambulation/Gait assistance: Contact guard assist Gait Distance (Feet): 25 Feet Assistive device: Rolling walker (2 wheels) Gait Pattern/deviations: WFL(Within Functional Limits), Wide base of support Gait velocity: significantly reduced     General Gait Details: Pt with slowed and effortful ambulation.  Pt normally ambulated ~50' at a given time, but not much more than that.   Stairs             Wheelchair Mobility     Tilt Bed    Modified Rankin (Stroke Patients Only)       Balance Overall balance assessment: Mild deficits observed, not formally tested                                          Communication Communication Communication: No apparent difficulties  Cognition Arousal: Alert Behavior During Therapy: WFL for tasks assessed/performed   PT - Cognitive impairments: No apparent impairments                                Cueing Cueing Techniques: Verbal cues  Exercises Supine ankles pumps, x10 each LE Supine hip abduction slides, x10 each LE, (AAROM on the R LE)  Supine QS, 3 sec holds, x10 each LE Supine glute squeeze, 3 sec holds, x10 Supine SLR, x10 each LE (AAROM on the R LE)     General Comments        Pertinent Vitals/Pain Pain Assessment Pain Assessment: 0-10 Pain Score: 7  Pain Location: R Hip Pain Descriptors / Indicators: Sharp Pain Intervention(s): Limited activity within patient's tolerance, Monitored during session, Premedicated before session, Repositioned    Home Living                          Prior Function            PT Goals (current goals can now be found in the care plan section) Acute Rehab PT Goals Patient Stated Goal: to get stronger and get back home with PCA. PT Goal Formulation: With patient Time For Goal Achievement: 03/07/24 Potential to Achieve Goals:  Good Progress towards PT goals: Progressing toward goals    Frequency    BID      PT Plan      Co-evaluation              AM-PAC PT "6 Clicks" Mobility   Outcome Measure  Help needed turning from your back to your side while in a flat bed without using bedrails?: A Little Help needed moving from lying on your back to sitting on the side of a flat bed without using bedrails?: A Little Help needed moving to and from a bed to a chair (including a wheelchair)?: A Little Help needed standing up from a chair using your arms (e.g., wheelchair or bedside chair)?: A Little Help needed to walk in hospital room?: A Lot Help needed climbing 3-5 steps with a railing? : Total 6 Click Score: 15    End of Session Equipment Utilized During Treatment: Gait belt Activity Tolerance: Patient tolerated treatment well;Patient limited by fatigue Patient left: in chair Nurse Communication: Mobility status PT Visit Diagnosis: Unsteadiness on feet (R26.81);Other abnormalities of gait and mobility (R26.89);Muscle weakness (generalized) (M62.81);Difficulty in walking, not elsewhere classified (R26.2)     Time: 1610-9604 PT Time Calculation (min) (ACUTE ONLY): 19 min  Charges:    $Therapeutic Exercise: 8-22 mins $Therapeutic Activity: 23-37 mins PT General Charges $$ ACUTE PT VISIT: 1 Visit                     Nolon Bussing, PT, DPT Physical Therapist - [facility]  02/22/24, 4:24 PM

## 2024-02-23 DIAGNOSIS — M1611 Unilateral primary osteoarthritis, right hip: Secondary | ICD-10-CM | POA: Diagnosis not present

## 2024-02-23 LAB — CBC
HCT: 29.2 % — ABNORMAL LOW (ref 36.0–46.0)
Hemoglobin: 9.7 g/dL — ABNORMAL LOW (ref 12.0–15.0)
MCH: 27 pg (ref 26.0–34.0)
MCHC: 33.2 g/dL (ref 30.0–36.0)
MCV: 81.3 fL (ref 80.0–100.0)
Platelets: 313 10*3/uL (ref 150–400)
RBC: 3.59 MIL/uL — ABNORMAL LOW (ref 3.87–5.11)
RDW: 15.1 % (ref 11.5–15.5)
WBC: 9.9 10*3/uL (ref 4.0–10.5)
nRBC: 0 % (ref 0.0–0.2)

## 2024-02-23 LAB — GLUCOSE, CAPILLARY
Glucose-Capillary: 114 mg/dL — ABNORMAL HIGH (ref 70–99)
Glucose-Capillary: 101 mg/dL — ABNORMAL HIGH (ref 70–99)
Glucose-Capillary: 114 mg/dL — ABNORMAL HIGH (ref 70–99)

## 2024-02-23 NOTE — Progress Notes (Signed)
 Physical Therapy Treatment Patient Details Name: Kristine Rangel MRN: [id number] DOB: 1956-07-19 Today's Date: 02/23/2024   History of Present Illness Pt is 68 yo female s/p R THA on 02/21/24. PMH of asthma, COPD, former smoker, past MI, HTN, anxiety. depression, CVA, s/p brain surgery (anuerysm repair), DMII, renal disease, CHF, rotator cuff surgery, glaucoma, R TKA revision, R TSA 08/02/23.    PT Comments  Pt seen for PT tx with pt agreeable, very motivated to participate. Pt requires min assist with extra time & use of hospital bed features to complete supine>sit, mod<>max assist to scoot to sitting EOB. Pt is able to progress gait, ambulating bed>sink with RW & CGA with slow, steady gait speed but pt does note fatigue afterwards. Will continue to follow pt acutely to progress mobility as able.    If plan is discharge home, recommend the following: A lot of help with walking and/or transfers;A lot of help with bathing/dressing/bathroom;Assist for transportation;Help with stairs or ramp for entrance   Can travel by private vehicle     No  Equipment Recommendations  Other (comment) (defer to next venue)    Recommendations for Other Services       Precautions / Restrictions Precautions Precautions: Fall;Posterior Hip Restrictions Weight Bearing Restrictions Per Provider Order: Yes RLE Weight Bearing Per Provider Order: Weight bearing as tolerated     Mobility  Bed Mobility Overal bed mobility: Needs Assistance Bed Mobility: Supine to Sit     Supine to sit: Min assist, HOB elevated, Used rails     General bed mobility comments: HOB elevated, significantly extra time, able to upright self with min assist (assistance with trunk & moving RLE to EOB) but requires mod<>max assist to scoot to sitting EOB with education/cuing re: lateral leans    Transfers Overall transfer level: Needs assistance Equipment used: Rolling walker (2 wheels) Transfers: Sit to/from Stand Sit to Stand:  Contact guard assist           General transfer comment: STS from EOB with rollator then with bari RW    Ambulation/Gait Ambulation/Gait assistance: Contact guard assist Gait Distance (Feet): 11 Feet Assistive device: Rolling walker (2 wheels) Gait Pattern/deviations: Decreased step length - left, Decreased stride length, Decreased step length - right Gait velocity: decreased     General Gait Details: Pt ambulates bed>sink with RW & chair follow for rest break if needed (but wasn't) with cuing for upright/forward vs downward gaze   Stairs             Wheelchair Mobility     Tilt Bed    Modified Rankin (Stroke Patients Only)       Balance Overall balance assessment: Needs assistance Sitting-balance support: Bilateral upper extremity supported, Feet unsupported Sitting balance-Leahy Scale: Fair     Standing balance support: Bilateral upper extremity supported, During functional activity, Reliant on assistive device for balance, Single extremity supported Standing balance-Leahy Scale: Fair Standing balance comment: standing at sink to wash face with 1UE with close supervision for balance                            Communication Communication Communication: No apparent difficulties  Cognition Arousal: Alert Behavior During Therapy: WFL for tasks assessed/performed   PT - Cognitive impairments: No apparent impairments                       PT - Cognition Comments: very pleasant, motivated to participate  Following commands: Intact      Cueing Cueing Techniques: Verbal cues, Visual cues  Exercises Total Joint Exercises Long Arc Quad: AROM, Seated, Strengthening, Right, 10 reps    General Comments        Pertinent Vitals/Pain Pain Assessment Pain Assessment: 0-10 Pain Score: 7  Pain Location: R Hip Pain Descriptors / Indicators: Grimacing, Discomfort, Guarding Pain Intervention(s): Monitored during session, Limited activity  within patient's tolerance    Home Living                          Prior Function            PT Goals (current goals can now be found in the care plan section) Acute Rehab PT Goals Patient Stated Goal: to get stronger and get back home with PCA. PT Goal Formulation: With patient Time For Goal Achievement: 03/07/24 Potential to Achieve Goals: Good Progress towards PT goals: Progressing toward goals    Frequency    BID      PT Plan      Co-evaluation              AM-PAC PT "6 Clicks" Mobility   Outcome Measure  Help needed turning from your back to your side while in a flat bed without using bedrails?: A Little Help needed moving from lying on your back to sitting on the side of a flat bed without using bedrails?: A Lot Help needed moving to and from a bed to a chair (including a wheelchair)?: A Little Help needed standing up from a chair using your arms (e.g., wheelchair or bedside chair)?: A Little Help needed to walk in hospital room?: A Lot Help needed climbing 3-5 steps with a railing? : Total 6 Click Score: 14    End of Session Equipment Utilized During Treatment: Gait belt Activity Tolerance: Patient tolerated treatment well;Patient limited by fatigue Patient left: in chair;with chair alarm set (in handoff to OT) Nurse Communication: Mobility status PT Visit Diagnosis: Unsteadiness on feet (R26.81);Other abnormalities of gait and mobility (R26.89);Muscle weakness (generalized) (M62.81);Difficulty in walking, not elsewhere classified (R26.2);Pain Pain - Right/Left: Right Pain - part of body: Hip     Time: 1355-1415 PT Time Calculation (min) (ACUTE ONLY): 20 min  Charges:    $Therapeutic Activity: 8-22 mins PT General Charges $$ ACUTE PT VISIT: 1 Visit                     Kristine Rangel, PT, DPT 02/23/24, 2:21 PM   Kristine Rangel 02/23/2024, 2:20 PM

## 2024-02-23 NOTE — Evaluation (Signed)
 Occupational Therapy Evaluation Patient Details Name: Kristine Rangel MRN: [id number] DOB: 1956-06-23 Today's Date: 02/23/2024   History of Present Illness   Pt is 68 yo female s/p R THA on 02/21/24. PMH of asthma, COPD, former smoker, past MI, HTN, anxiety. depression, CVA, s/p brain surgery (anuerysm repair), DMII, renal disease, CHF, rotator cuff surgery, glaucoma, R TKA revision, R TSA 08/02/23.     Clinical Impressions Patient presenting with deceased Ind in self care,balance, functional mobility/transfers, endurance, and safety awareness.  Patient reports living at home and ambulating short distances with rollator. She has PCA that assists with self care and IADLs 7 days a week but is otherwise at home alone. Patient currently functioning at min A for mobility but fatigues quickly. Pt is able to verbalized hip precautions with 100% accuracy. OT begins education on increasing LB ind with use of AD. Education to continue. Pt seated in recliner chair with call bell and all needed items within reach.  Patient will benefit from acute OT to increase overall independence in the areas of ADLs, functional mobility, and safety awareness in order to safely discharge.     If plan is discharge home, recommend the following:   A lot of help with walking and/or transfers;A lot of help with bathing/dressing/bathroom;Assist for transportation;Assistance with cooking/housework;Help with stairs or ramp for entrance     Functional Status Assessment   Patient has had a recent decline in their functional status and demonstrates the ability to make significant improvements in function in a reasonable and predictable amount of time.     Equipment Recommendations   Other (comment) (defer to next venue of care)      Precautions/Restrictions   Precautions Precautions: Fall;Posterior Hip Restrictions Weight Bearing Restrictions Per Provider Order: Yes RLE Weight Bearing Per Provider Order: Weight  bearing as tolerated     Mobility Bed Mobility               General bed mobility comments: seated on EOB when therapist entered the room    Transfers Overall transfer level: Needs assistance Equipment used: Rolling walker (2 wheels) Transfers: Sit to/from Stand Sit to Stand: Contact guard assist                  Balance Overall balance assessment: Needs assistance Sitting-balance support: Bilateral upper extremity supported, Feet unsupported Sitting balance-Leahy Scale: Fair     Standing balance support: Bilateral upper extremity supported, During functional activity, Reliant on assistive device for balance, Single extremity supported Standing balance-Leahy Scale: Fair Standing balance comment: standing at sink to wash face with 1UE with close supervision for balance                           ADL either performed or assessed with clinical judgement   ADL Overall ADL's : Needs assistance/impaired     Grooming: Wash/dry hands;Wash/dry face;Standing;Contact guard assist                                       Vision Patient Visual Report: No change from baseline              Pertinent Vitals/Pain Pain Assessment Pain Assessment: 0-10 Pain Score: 7  Pain Location: R Hip Pain Descriptors / Indicators: Grimacing, Discomfort, Guarding Pain Intervention(s): Limited activity within patient's tolerance, Monitored during session, Repositioned     Extremity/Trunk Assessment Upper  Extremity Assessment Upper Extremity Assessment: Generalized weakness           Communication Communication Communication: No apparent difficulties   Cognition Arousal: Alert Behavior During Therapy: WFL for tasks assessed/performed                                 Following commands: Intact       Cueing  General Comments   Cueing Techniques: Verbal cues;Visual cues              Home Living Family/patient expects to be  discharged to:: Private residence Living Arrangements: Alone Available Help at Discharge: Personal care attendant Type of Home: Apartment Home Access: Level entry     Home Layout: One level     Bathroom Shower/Tub: Chief Strategy Officer: Standard     Home Equipment: BSC/3in1;Cane - single point;Rollator (4 wheels);Shower seat   Additional Comments: Pt reports having PCA M-F from 9a-4p and weekends from 9a-11a      Prior Functioning/Environment Prior Level of Function : Needs assist               ADLs Comments: Pt endorses ambulating short distances with rollator and PCA assists pt with ADLs and IADLs.    OT Problem List: Decreased strength;Decreased activity tolerance;Decreased safety awareness;Impaired balance (sitting and/or standing);Decreased knowledge of use of DME or AE;Pain   OT Treatment/Interventions: Self-care/ADL training;Therapeutic exercise;Therapeutic activities;Energy conservation;DME and/or AE instruction;Patient/family education;Balance training      OT Goals(Current goals can be found in the care plan section)   Acute Rehab OT Goals Patient Stated Goal: to decrease pain and get stronger OT Goal Formulation: With patient Time For Goal Achievement: 03/08/24 Potential to Achieve Goals: Fair ADL Goals Pt Will Perform Grooming: with modified independence;standing Pt Will Perform Lower Body Dressing: with supervision;with adaptive equipment;sit to/from stand Pt Will Transfer to Toilet: with supervision;ambulating Pt Will Perform Toileting - Clothing Manipulation and hygiene: with supervision;sit to/from stand   OT Frequency:  Min 2X/week       AM-PAC OT "6 Clicks" Daily Activity     Outcome Measure Help from another person eating meals?: None Help from another person taking care of personal grooming?: None Help from another person toileting, which includes using toliet, bedpan, or urinal?: A Lot Help from another person bathing  (including washing, rinsing, drying)?: A Lot Help from another person to put on and taking off regular upper body clothing?: A Little Help from another person to put on and taking off regular lower body clothing?: A Lot 6 Click Score: 17   End of Session Equipment Utilized During Treatment: Rolling walker (2 wheels)  Activity Tolerance: Patient tolerated treatment well Patient left: in chair;with call bell/phone within reach;with chair alarm set  OT Visit Diagnosis: Unsteadiness on feet (R26.81);Repeated falls (R29.6);Muscle weakness (generalized) (M62.81)                Time: 1610-9604 OT Time Calculation (min): 21 min Charges:  OT General Charges $OT Visit: 1 Visit OT Evaluation $OT Eval Moderate Complexity: 1 [address] [place], OTR/L , CBIS ascom [phone]  02/23/24, 3:20 PM

## 2024-02-23 NOTE — Plan of Care (Signed)
  Problem: Education: Goal: Knowledge of General Education information will improve Description: Including pain rating scale, medication(s)/side effects and non-pharmacologic comfort measures Outcome: Progressing   Problem: Health Behavior/Discharge Planning: Goal: Ability to manage health-related needs will improve Outcome: Progressing   Problem: Clinical Measurements: Goal: Ability to maintain clinical measurements within normal limits will improve Outcome: Progressing Goal: Will remain free from infection Outcome: Progressing Goal: Diagnostic test results will improve Outcome: Progressing Goal: Respiratory complications will improve Outcome: Progressing Goal: Cardiovascular complication will be avoided Outcome: Progressing   Problem: Activity: Goal: Risk for activity intolerance will decrease Outcome: Progressing   Problem: Nutrition: Goal: Adequate nutrition will be maintained Outcome: Progressing   Problem: Coping: Goal: Level of anxiety will decrease Outcome: Progressing   Problem: Elimination: Goal: Will not experience complications related to bowel motility Outcome: Progressing Goal: Will not experience complications related to urinary retention Outcome: Progressing   Problem: Safety: Goal: Ability to remain free from injury will improve Outcome: Progressing   Problem: Skin Integrity: Goal: Risk for impaired skin integrity will decrease Outcome: Progressing   Problem: Education: Goal: Knowledge of the prescribed therapeutic regimen will improve Outcome: Progressing Goal: Understanding of discharge needs will improve Outcome: Progressing Goal: Individualized Educational Video(s) Outcome: Progressing   Problem: Clinical Measurements: Goal: Postoperative complications will be avoided or minimized Outcome: Progressing   Problem: Pain Management: Goal: Pain level will decrease with appropriate interventions Outcome: Progressing   Problem: Skin  Integrity: Goal: Will show signs of wound healing Outcome: Progressing

## 2024-02-23 NOTE — Progress Notes (Signed)
 Subjective: 2 Days Post-Op Procedure(s) (LRB): ARTHROPLASTY, HIP, TOTAL,POSTERIOR APPROACH (Right) Patient reports pain as mild.  Pain improving Patient is well, and has had no acute complaints or problems Plan is to go Home after hospital stay. Negative for chest pain and shortness of breath Fever: no Gastrointestinal:Negative for nausea and vomiting   Objective: Vital signs in last 24 hours: Temp:  [97.5 F (36.4 C)-98.2 F (36.8 C)] 97.5 F (36.4 C) (04/12 0743) Pulse Rate:  [65-81] 80 (04/12 0743) Resp:  [16-20] 20 (04/12 0743) BP: (114-125)/(49-69) 117/69 (04/12 0743) SpO2:  [92 %-100 %] 96 % (04/12 0743)  Intake/Output from previous day:  Intake/Output Summary (Last 24 hours) at 02/23/2024 0836 Last data filed at 02/23/2024 0420 Gross per 24 hour  Intake 1192.5 ml  Output 400 ml  Net 792.5 ml    Intake/Output this shift: No intake/output data recorded.  Labs: Recent Labs    02/22/24 0531 02/23/24 0547  HGB 10.3* 9.7*   Recent Labs    02/22/24 0531 02/23/24 0547  WBC 12.5* 9.9  RBC 3.71* 3.59*  HCT 31.3* 29.2*  PLT 294 313   Recent Labs    02/22/24 0531  NA 137  K 4.0  CL 106  CO2 24  BUN 24*  CREATININE 1.26*  GLUCOSE 146*  CALCIUM 9.2   No results for input(s): "LABPT", "INR" in the last 72 hours.   EXAM General - Patient is Alert, Appropriate, and Oriented Extremity - Neurovascular intact Dorsiflexion/Plantar flexion intact Incision: dressing C/D/I, no drainage, and Prevena intact without drainage No cellulitis present Compartment soft Dressing/Incision - Prevena intact with good seal. Motor Function - intact, moving foot and toes well on exam.    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   Bladder leak    Cardiac murmur    Chest pain    a.) 10/07/2008 Neg [place] Echo; b.) LHC 09/11/2011 - minor lum irregs; no sig CAD; c.) MV 06/02/2015: no ischemia; d.) cCTA 02/2021: Ca2+ = 0. Normal coronaries   CKD (chronic kidney disease), stage III (HCC)    COPD (chronic obstructive pulmonary disease) (HCC)    Depression    Diet-controlled type 2 diabetes mellitus (HCC)    Dyspnea    Essential hypertension    GERD (gastroesophageal reflux disease)    Glaucoma    History of kidney stones    History of left heart catheterization (LHC) 09/11/2011   a.) LHC 09/11/2011: minor luminal irregulaties mLAD, mLCx, mRCA - med mgmt.   Hyperlipidemia    Insomnia    a.) takes suvorexant   Long term current use of aspirin    MI (myocardial infarction) (HCC)    a.) in the 1990s; details unclear   Migraine with aura    Neuropathy    OSA on CPAP    Rotator cuff injury    Seasonal allergies    Tendinitis of right rotator cuff    TIA (transient ischemic attack)     Assessment/Plan: 2 Days  Post-Op Procedure(s) (LRB): ARTHROPLASTY, HIP, TOTAL,POSTERIOR APPROACH (Right) Principal Problem:   Status post total hip replacement, right  Estimated body mass index is 47.47 kg/m as calculated from the following:   Height as of 02/19/24: 5\' 3"  (1.6 m).   Weight as of 02/19/24: 121.6 kg. Advance diet Up with therapy  Vital signs are stable Labs are stable, hemoglobin 9.7. Plan for possible d/c home today pending progress with PT.  Discharge home with Prevena, change to dry dressing in 7-10 days.  DVT Prophylaxis -  Eliquis , SCDs Weight-Bearing as tolerated to right leg  T. Thomos Flies, PA-C [facility] 02/23/2024, 8:36 AM

## 2024-02-23 NOTE — Progress Notes (Signed)
 Physical Therapy Treatment Patient Details Name: Kristine Rangel MRN: [id number] DOB: 1956/01/22 Today's Date: 02/23/2024   History of Present Illness Pt is 68 yo female s/p R THA on 02/21/24. PMH of asthma, COPD, former smoker, past MI, HTN, anxiety. depression, CVA, s/p brain surgery (anuerysm repair), DMII, renal disease, CHF, rotator cuff surgery, glaucoma, R TKA revision, R TSA 08/02/23.    PT Comments  Pt was pleasant and motivated to participate during the session and put forth good effort throughout. Pt required significant time and effort along with constant min A for RLE management with bed mobility tasks.  Pt initially was motivated to amb from bed to BR toilet but ambulation was very slow with effortful steps with antalgic step-to pattern, trunk flexed with heavy lean on the RW for support. Ultimately pt was only able to take several steps near the EOB and then to the [facility] with similar results returning from [facility] to bed.  Pt's SpO2 and HR were both WNL on room air.  Pt will benefit from continued PT services upon discharge to safely address deficits listed in patient problem list for decreased caregiver assistance and eventual return to PLOF.       If plan is discharge home, recommend the following: A lot of help with walking and/or transfers;A lot of help with bathing/dressing/bathroom;Assist for transportation;Help with stairs or ramp for entrance   Can travel by private vehicle     No  Equipment Recommendations  Other (comment) (TBD at next venue of care)    Recommendations for Other Services       Precautions / Restrictions Precautions Precautions: Posterior Hip Precaution Booklet Issued: Yes (comment) Restrictions Weight Bearing Restrictions Per Provider Order: Yes RLE Weight Bearing Per Provider Order: Weight bearing as tolerated     Mobility  Bed Mobility Overal bed mobility: Needs Assistance Bed Mobility: Supine to Sit, Sit to Supine     Supine to sit: Min  assist Sit to supine: Min assist   General bed mobility comments: Min A for RLE management    Transfers Overall transfer level: Needs assistance Equipment used: Rolling walker (2 wheels) Transfers: Sit to/from Stand Sit to Stand: Min assist, From elevated surface           General transfer comment: Min A to stand with mod verbal cues for sequencing for hip prec compliance    Ambulation/Gait Ambulation/Gait assistance: Contact guard assist Gait Distance (Feet): 4 Feet x 2 Assistive device: Rolling walker (2 wheels) Gait Pattern/deviations: Trunk flexed, Step-to pattern, Decreased stance time - right, Decreased step length - left, Antalgic Gait velocity: decreased     General Gait Details: Very slow, effortful steps with antalgic step-to pattern, trunk flexed with heavy lean on the RW for support   Stairs             Wheelchair Mobility     Tilt Bed    Modified Rankin (Stroke Patients Only)       Balance Overall balance assessment: Needs assistance   Sitting balance-Leahy Scale: Good     Standing balance support: Bilateral upper extremity supported, During functional activity, Reliant on assistive device for balance Standing balance-Leahy Scale: Fair                              Hotel manager: No apparent difficulties  Cognition Arousal: Alert Behavior During Therapy: WFL for tasks assessed/performed   PT - Cognitive impairments: No apparent impairments  Cueing Cueing Techniques: Verbal cues, Visual cues  Exercises Other Exercises Other Exercises: Posterior hip precaution education and review    General Comments        Pertinent Vitals/Pain Pain Assessment Pain Assessment: 0-10 Pain Score: 7  Pain Location: R Hip Pain Descriptors / Indicators: Aching, Sore Pain Intervention(s): Premedicated before session, Monitored during session, Repositioned    Home  Living                          Prior Function            PT Goals (current goals can now be found in the care plan section) Progress towards PT goals: Not progressing toward goals - comment (limited by functional weakness)    Frequency    BID      PT Plan      Co-evaluation              AM-PAC PT "6 Clicks" Mobility   Outcome Measure  Help needed turning from your back to your side while in a flat bed without using bedrails?: A Little Help needed moving from lying on your back to sitting on the side of a flat bed without using bedrails?: A Little Help needed moving to and from a bed to a chair (including a wheelchair)?: A Little Help needed standing up from a chair using your arms (e.g., wheelchair or bedside chair)?: A Little Help needed to walk in hospital room?: A Lot Help needed climbing 3-5 steps with a railing? : Total 6 Click Score: 15    End of Session Equipment Utilized During Treatment: Gait belt Activity Tolerance: Patient tolerated treatment well Patient left: in bed;with call bell/phone within reach;with bed alarm set Nurse Communication: Mobility status;Weight bearing status;Precautions PT Visit Diagnosis: Unsteadiness on feet (R26.81);Other abnormalities of gait and mobility (R26.89);Muscle weakness (generalized) (M62.81);Difficulty in walking, not elsewhere classified (R26.2);Pain Pain - Right/Left: Right Pain - part of body: Hip     Time: 1610-9604 PT Time Calculation (min) (ACUTE ONLY): 33 min  Charges:    $Gait Training: 8-22 mins $Therapeutic Activity: 8-22 mins PT General Charges $$ ACUTE PT VISIT: 1 Visit                    D. Scott Sundae Maners PT, DPT 02/23/24, 11:06 AM

## 2024-02-24 DIAGNOSIS — M1611 Unilateral primary osteoarthritis, right hip: Secondary | ICD-10-CM | POA: Diagnosis not present

## 2024-02-24 MED ORDER — MAGNESIUM HYDROXIDE 400 MG/5ML PO SUSP
30.0000 mL | Freq: Once | ORAL | Status: DC
  Filled 2024-02-24: qty 30

## 2024-02-24 NOTE — Progress Notes (Signed)
 Subjective: 3 Days Post-Op Procedure(s) (LRB): ARTHROPLASTY, HIP, TOTAL,POSTERIOR APPROACH (Right) Patient reports pain as mild.  Pain improving Patient is well, and has had no acute complaints or problems Plan is to go Skilled nursing facility after hospital stay. Negative for chest pain and shortness of breath Fever: no Gastrointestinal:Negative for nausea and vomiting Has not had a bowel movement.   Objective: Vital signs in last 24 hours: Temp:  [97.6 F (36.4 C)-98.1 F (36.7 C)] 97.8 F (36.6 C) (04/13 0824) Pulse Rate:  [72-86] 79 (04/13 0824) Resp:  [16-20] 16 (04/13 0824) BP: (98-129)/(56-70) 114/69 (04/13 0824) SpO2:  [96 %-98 %] 98 % (04/13 0824)  Intake/Output from previous day:  Intake/Output Summary (Last 24 hours) at 02/24/2024 1038 Last data filed at 02/24/2024 0900 Gross per 24 hour  Intake 360 ml  Output --  Net 360 ml    Intake/Output this shift: Total I/O In: 120 [P.O.:120] Out: -   Labs: Recent Labs    02/22/24 0531 02/23/24 0547  HGB 10.3* 9.7*   Recent Labs    02/22/24 0531 02/23/24 0547  WBC 12.5* 9.9  RBC 3.71* 3.59*  HCT 31.3* 29.2*  PLT 294 313   Recent Labs    02/22/24 0531  NA 137  K 4.0  CL 106  CO2 24  BUN 24*  CREATININE 1.26*  GLUCOSE 146*  CALCIUM 9.2   No results for input(s): "LABPT", "INR" in the last 72 hours.   EXAM General - Patient is Alert, Appropriate, and Oriented Extremity - Neurovascular intact Dorsiflexion/Plantar flexion intact Incision: dressing C/D/I, no drainage, and Prevena intact without drainage No cellulitis present Compartment soft Dressing/Incision - Prevena intact with good seal. Motor Function - intact, moving foot and toes well on exam.    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   Bladder leak    Cardiac murmur    Chest pain    a.) 10/07/2008 Neg Dobuatmine Echo; b.) LHC 09/11/2011 - minor lum irregs; no sig CAD; c.) MV 06/02/2015: no ischemia; d.) cCTA 02/2021: Ca2+ = 0. Normal coronaries   CKD (chronic kidney disease), stage III (HCC)    COPD (chronic obstructive pulmonary disease) (HCC)    Depression    Diet-controlled type 2 diabetes mellitus (HCC)    Dyspnea    Essential hypertension    GERD (gastroesophageal reflux disease)    Glaucoma    History of kidney stones    History of left heart catheterization (LHC) 09/11/2011   a.) LHC 09/11/2011: minor luminal irregulaties mLAD, mLCx, mRCA - med mgmt.   Hyperlipidemia    Insomnia    a.) takes suvorexant   Long term current use of aspirin    MI (myocardial infarction) (HCC)    a.) in the 1990s; details unclear   Migraine with aura    Neuropathy    OSA on CPAP    Rotator cuff injury    Seasonal allergies    Tendinitis of right rotator cuff  TIA (transient ischemic attack)     Assessment/Plan: 3 Days Post-Op Procedure(s) (LRB): ARTHROPLASTY, HIP, TOTAL,POSTERIOR APPROACH (Right) Principal Problem:   Status post total hip replacement, right  Estimated body mass index is 47.47 kg/m as calculated from the following:   Height as of 02/19/24: 5\' 3"  (1.6 m).   Weight as of 02/19/24: 121.6 kg. Advance diet Up with therapy  Vital signs are stable Labs are stable Pain well-controlled Continue to work on bowel movement. Care management to assist with discharge to skilled nursing facility.  Patient will be ready for discharge tomorrow to SNF  Discharge home with Prevena, change to dry dressing in 7-10 days.  DVT  Prophylaxis -  Eliquis , SCDs Weight-Bearing as tolerated to right leg  T. Thomos Flies, PA-C [facility] 02/24/2024, 10:38 AM

## 2024-02-24 NOTE — Progress Notes (Signed)
 Physical Therapy Treatment Patient Details Name: Kristine Rangel MRN: [id number] DOB: [birth year] Today's Date: 02/24/2024   History of Present Illness Pt is 68 yo female s/p R THA on 02/21/24. PMH of asthma, COPD, former smoker, past MI, HTN, anxiety. depression, CVA, s/p brain surgery (anuerysm repair), DMII, renal disease, CHF, rotator cuff surgery, glaucoma, R TKA revision, R TSA 08/02/23.    PT Comments  Pt ready for session.  Premedicated.  She is able to get to EOB on left with min a x 1.  Stands and transfers to [facility] to void.  Assist to put on double brief and liner pad from home.  She is able to walk around bed slowly with RW and cga x 1 with occasional standing rest breaks.  She does remain up in chair after session following seated AROM.     If plan is discharge home, recommend the following: A lot of help with walking and/or transfers;A lot of help with bathing/dressing/bathroom;Assist for transportation;Help with stairs or ramp for entrance   Can travel by private vehicle     No  Equipment Recommendations  Other (comment) (defer to next venue)    Recommendations for Other Services       Precautions / Restrictions Precautions Precautions: Fall;Posterior Hip Restrictions Weight Bearing Restrictions Per Provider Order: Yes RLE Weight Bearing Per Provider Order: Weight bearing as tolerated     Mobility  Bed Mobility Overal bed mobility: Needs Assistance Bed Mobility: Supine to Sit     Supine to sit: Min assist, HOB elevated, Used rails       Patient Response: Cooperative  Transfers Overall transfer level: Needs assistance Equipment used: Rolling walker (2 wheels) Transfers: Sit to/from Stand Sit to Stand: Min assist                Ambulation/Gait Ambulation/Gait assistance: Contact guard assist Gait Distance (Feet): 15 Feet Assistive device: Rolling walker (2 wheels) Gait Pattern/deviations: Decreased step length - left, Decreased stride length, Decreased  step length - right Gait velocity: decreased     General Gait Details: our left side of bed then walks around to recliner after using [facility]   Stairs             Wheelchair Mobility     Tilt Bed Tilt Bed Patient Response: Cooperative  Modified Rankin (Stroke Patients Only)       Balance Overall balance assessment: Needs assistance Sitting-balance support: Bilateral upper extremity supported, Feet unsupported Sitting balance-Leahy Scale: Fair     Standing balance support: Bilateral upper extremity supported, During functional activity, Reliant on assistive device for balance, Single extremity supported Standing balance-Leahy Scale: Fair                              Hotel manager: No apparent difficulties  Cognition Arousal: Alert Behavior During Therapy: WFL for tasks assessed/performed   PT - Cognitive impairments: No apparent impairments                       PT - Cognition Comments: very pleasant, motivated to participate Following commands: Intact      Cueing Cueing Techniques: Verbal cues, Visual cues  Exercises Other Exercises Other Exercises: seated AROM Other Exercises: BSC to void    General Comments        Pertinent Vitals/Pain Pain Assessment Pain Assessment: Faces Faces Pain Scale: Hurts little more Pain Location: R Hip Pain Descriptors / Indicators:  Grimacing, Discomfort, Guarding Pain Intervention(s): Limited activity within patient's tolerance, Monitored during session, Repositioned, Premedicated before session    Home Living                          Prior Function            PT Goals (current goals can now be found in the care plan section) Progress towards PT goals: Progressing toward goals    Frequency    BID      PT Plan      Co-evaluation              AM-PAC PT "6 Clicks" Mobility   Outcome Measure  Help needed turning from your back to your side  while in a flat bed without using bedrails?: A Little Help needed moving from lying on your back to sitting on the side of a flat bed without using bedrails?: A Lot Help needed moving to and from a bed to a chair (including a wheelchair)?: A Little Help needed standing up from a chair using your arms (e.g., wheelchair or bedside chair)?: A Little Help needed to walk in hospital room?: A Little Help needed climbing 3-5 steps with a railing? : Total 6 Click Score: 15    End of Session Equipment Utilized During Treatment: Gait belt Activity Tolerance: Patient tolerated treatment well;Patient limited by fatigue Patient left: in chair;with chair alarm set;with call bell/phone within reach (in handoff to OT) Nurse Communication: Mobility status PT Visit Diagnosis: Unsteadiness on feet (R26.81);Other abnormalities of gait and mobility (R26.89);Muscle weakness (generalized) (M62.81);Difficulty in walking, not elsewhere classified (R26.2);Pain Pain - Right/Left: Right Pain - part of body: Hip     Time: 1610-9604 PT Time Calculation (min) (ACUTE ONLY): 34 min  Charges:    $Gait Training: 8-22 mins $Therapeutic Activity: 8-22 mins PT General Charges $$ ACUTE PT VISIT: 1 Visit                   Charlanne Cong, PTA 02/24/24, 10:45 AM

## 2024-02-24 NOTE — TOC Progression Note (Signed)
 Transition of Care [facility]) - Progression Note    Patient Details  Name: Kristine Rangel MRN: [id number] Date of Birth: 08/24/1956  Transition of Care [facility]) CM/SW Contact  Areta Beer, RN Phone Number: 02/24/2024, 4:40 PM  Clinical Narrative:  4/13:  Resources for SDOH flags added to AVS via Care Coordination for housing, food, and transportation.    Katheryn Pandy MSN RN CM  RN Case Manager   Transitions of Care Direct Dial: [phone] (Weekends Only) [facility] Main Office Phone: [phone] [facility] Fax: [phone] [email or web address]          Expected Discharge Plan and Services                                               Social Determinants of Health (SDOH) Interventions SDOH Screenings   Food Insecurity: Food Insecurity Present (02/21/2024)  Housing: High Risk (02/21/2024)  Transportation Needs: Unmet Transportation Needs (02/21/2024)  Utilities: Not At Risk (02/21/2024)  Financial Resource Strain: Low Risk  (11/30/2023)   Received from [facility]  Social Connections: Moderately Integrated (02/21/2024)  Tobacco Use: High Risk (02/21/2024)    Readmission Risk Interventions     No data to display

## 2024-02-25 DIAGNOSIS — M1611 Unilateral primary osteoarthritis, right hip: Secondary | ICD-10-CM | POA: Diagnosis not present

## 2024-02-25 MED ORDER — GLUCERNA SHAKE PO LIQD
237.0000 mL | Freq: Three times a day (TID) | ORAL | Status: DC
  Administered 2024-02-25 – 2024-03-03 (×13): 237 mL via ORAL

## 2024-02-25 NOTE — Plan of Care (Signed)

## 2024-02-25 NOTE — Progress Notes (Signed)
 PT Cancellation Note  Patient Details Name: Kristine Rangel MRN: [id number] DOB: September 16, 1956   Cancelled Treatment:    Reason Eval/Treat Not Completed: Other (comment)  Pt back to bed.  Declined therapy this pm.  Stated she is dealing with some family issues today.  Will return in AM.   Charlanne Cong 02/25/2024, 2:23 PM

## 2024-02-25 NOTE — NC FL2 (Signed)
 [place] MEDICAID [id number] LEVEL OF CARE FORM     IDENTIFICATION  Patient Name: Kristine Rangel Birthdate: 07-Jun-1956 Sex: female Admission Date (Current Location): 02/21/2024  [facility] and [place] Number:  Chiropodist and Address:  [facility], [address], [place], [place] [address]      Provider Number: [id number]  Attending Physician Name and Address:  Christena Flake, MD  Relative Name and Phone Number:  Park Meo (other) phone: [phone]    Current Level of Care: Hospital Recommended Level of Care: Skilled Nursing Facility Prior Approval Number:    Date Approved/Denied: 11/23/16 PASRR Number: [id number]  Discharge Plan: SNF    Current Diagnoses: Patient Active Problem List   Diagnosis Date Noted   Status post total hip replacement, right 02/21/2024   Right sided weakness 05/15/2022   GERD (gastroesophageal reflux disease) 05/15/2022   Peripheral neuropathy 05/15/2022   Hyperlipidemia associated with type 2 diabetes mellitus (HCC) 05/15/2022   OSA (obstructive sleep apnea) 05/15/2022   Chronic heart failure with preserved ejection fraction (HFpEF) (HCC) 05/15/2022   Asthma, chronic 05/15/2022   Chronic kidney disease, stage 3a (HCC) 05/15/2022   Chest pain 02/05/2021   SOB (shortness of breath) 02/05/2021   Leg swelling 02/05/2021   Nonrheumatic mitral valve regurgitation 02/05/2021   Morbid obesity (HCC) 02/05/2021   S/P revision of total knee, right 05/05/2020   S/P right rotator cuff repair 07/30/2018   S/P left rotator cuff repair 11/23/2016   Essential hypertension 02/05/2003    Orientation RESPIRATION BLADDER Height & Weight     Self, Time, Situation  Normal Continent Weight:   Height:     BEHAVIORAL SYMPTOMS/MOOD NEUROLOGICAL BOWEL NUTRITION STATUS      Continent Diet (Please see discharge summary)  AMBULATORY STATUS COMMUNICATION OF NEEDS Skin   Limited Assist Verbally Skin abrasions (ecchomysis on upper  extremities)                       Personal Care Assistance Level of Assistance  Bathing, Feeding, Dressing Bathing Assistance: Limited assistance Feeding assistance: Limited assistance Dressing Assistance: Limited assistance     Functional Limitations Info             SPECIAL CARE FACTORS FREQUENCY                       Contractures      Additional Factors Info  Code Status Code Status Info: Full Code             Current Medications (02/25/2024):  This is the current hospital active medication list Current Facility-Administered Medications  Medication Dose Route Frequency Provider Last Rate Last Admin   acetaminophen (TYLENOL) tablet 325-650 mg  325-650 mg Oral Q6H PRN Poggi, Excell Seltzer, MD       albuterol (PROVENTIL) (2.5 MG/3ML) 0.083% nebulizer solution 2.5 mg  2.5 mg Nebulization Q6H PRN Poggi, Excell Seltzer, MD       apixaban Everlene Balls) tablet 2.5 mg  2.5 mg Oral BID Poggi, Excell Seltzer, MD   2.5 mg at 02/25/24 0813   atorvastatin (LIPITOR) tablet 80 mg  80 mg Oral QHS Christena Flake, MD   80 mg at 02/24/24 2146   bisacodyl (DULCOLAX) suppository 10 mg  10 mg Rectal Daily PRN Poggi, Excell Seltzer, MD       brimonidine (ALPHAGAN) 0.2 % ophthalmic solution 1 drop  1 drop Both Eyes QHS Meegan, Eryn, RPH   1  drop at 02/24/24 2147   carvedilol (COREG) tablet 6.25 mg  6.25 mg Oral BID Christena Flake, MD   6.25 mg at 02/25/24 4098   diphenhydrAMINE (BENADRYL) 12.5 MG/5ML elixir 12.5-25 mg  12.5-25 mg Oral Q4H PRN Poggi, Excell Seltzer, MD       docusate sodium (COLACE) capsule 100 mg  100 mg Oral BID Christena Flake, MD   100 mg at 02/25/24 1191   DULoxetine (CYMBALTA) DR capsule 60 mg  60 mg Oral QHS Christena Flake, MD   60 mg at 02/24/24 2144   empagliflozin (JARDIANCE) tablet 25 mg  25 mg Oral Daily Poggi, Excell Seltzer, MD   25 mg at 02/25/24 0814   fluticasone (FLONASE) 50 MCG/ACT nasal spray 1 spray  1 spray Each Nare Daily PRN Poggi, Excell Seltzer, MD       fluticasone furoate-vilanterol (BREO ELLIPTA)  200-25 MCG/ACT 1 puff  1 puff Inhalation Daily Poggi, Excell Seltzer, MD   1 puff at 02/25/24 0814   furosemide (LASIX) tablet 40 mg  40 mg Oral BID Christena Flake, MD   40 mg at 02/25/24 1519   gabapentin (NEURONTIN) capsule 300 mg  300 mg Oral TID Christena Flake, MD   300 mg at 02/25/24 1519   HYDROmorphone (DILAUDID) injection 0.25-0.5 mg  0.25-0.5 mg Intravenous Q3H PRN Poggi, Excell Seltzer, MD   0.5 mg at 02/24/24 1014   irbesartan (AVAPRO) tablet 37.5 mg  37.5 mg Oral Daily Poggi, Excell Seltzer, MD   37.5 mg at 02/25/24 0813   isosorbide mononitrate (IMDUR) 24 hr tablet 90 mg  90 mg Oral Daily Poggi, Excell Seltzer, MD   90 mg at 02/24/24 2145   loratadine (CLARITIN) tablet 10 mg  10 mg Oral Daily Poggi, Excell Seltzer, MD   10 mg at 02/25/24 4782   magnesium hydroxide (MILK OF MAGNESIA) suspension 30 mL  30 mL Oral Daily PRN Poggi, Excell Seltzer, MD       magnesium hydroxide (MILK OF MAGNESIA) suspension 30 mL  30 mL Oral Once Gaines, Thomas C, PA-C       metoCLOPramide (REGLAN) tablet 5-10 mg  5-10 mg Oral Q8H PRN Poggi, Excell Seltzer, MD       Or   metoCLOPramide (REGLAN) injection 5-10 mg  5-10 mg Intravenous Q8H PRN Poggi, Excell Seltzer, MD       mirabegron ER (MYRBETRIQ) tablet 50 mg  50 mg Oral Daily Poggi, Excell Seltzer, MD   50 mg at 02/25/24 0813   montelukast (SINGULAIR) tablet 10 mg  10 mg Oral QHS Poggi, Excell Seltzer, MD   10 mg at 02/24/24 2146   ondansetron (ZOFRAN) tablet 4 mg  4 mg Oral Q6H PRN Poggi, Excell Seltzer, MD       Or   ondansetron (ZOFRAN) injection 4 mg  4 mg Intravenous Q6H PRN Poggi, Excell Seltzer, MD       oxyCODONE (Oxy IR/ROXICODONE) immediate release tablet 5-10 mg  5-10 mg Oral Q4H PRN Poggi, Excell Seltzer, MD   10 mg at 02/25/24 0912   pantoprazole (PROTONIX) EC tablet 80 mg  80 mg Oral Q1200 Poggi, Excell Seltzer, MD   80 mg at 02/25/24 0813   sodium phosphate (FLEET) enema 1 enema  1 enema Rectal Once PRN Poggi, Excell Seltzer, MD       timolol (TIMOPTIC) 0.5 % ophthalmic solution 1 drop  1 drop Both Eyes QHS Meegan, Eryn, RPH   1 drop at 02/24/24 2147  tizanidine (ZANAFLEX) capsule 2 mg  2 mg Oral Q6H PRN Poggi, John J, MD       topiramate (TOPAMAX) tablet 50 mg  50 mg Oral QHS Poggi, John J, MD   50 mg at 02/24/24 2152     Discharge Medications: Please see discharge summary for a list of discharge medications.  Relevant Imaging Results:  Relevant Lab Results:   Additional Information [phone]; Allergies:Bee Venom  High - Anaphylaxis  Penicillins  High - Hives, Other (See Comments), Swelling Comments  Product Containing Penicillin (product)    Received from outside source  No severity specified - Not available  Crayton Docker, RN

## 2024-02-25 NOTE — Progress Notes (Signed)
 Subjective: 4 Days Post-Op Procedure(s) (LRB): ARTHROPLASTY, HIP, TOTAL,POSTERIOR APPROACH (Right) Patient reports pain as mild.  Pain improving Patient is well, and has had no acute complaints or problems Plan is to go Skilled nursing facility after hospital stay. Negative for chest pain and shortness of breath Fever: no Gastrointestinal:Negative for nausea and vomiting Has not had a bowel movement.   Objective: Vital signs in last 24 hours: Temp:  [97.8 F (36.6 C)-98.6 F (37 C)] 98.1 F (36.7 C) (04/14 0443) Pulse Rate:  [66-79] 66 (04/14 0443) Resp:  [16-17] 17 (04/14 0443) BP: (114-135)/(53-81) 135/81 (04/14 0443) SpO2:  [95 %-99 %] 97 % (04/14 0443)  Intake/Output from previous day:  Intake/Output Summary (Last 24 hours) at 02/25/2024 0729 Last data filed at 02/25/2024 0038 Gross per 24 hour  Intake 120 ml  Output 700 ml  Net -580 ml    Intake/Output this shift: No intake/output data recorded.  Labs: Recent Labs    02/23/24 0547  HGB 9.7*   Recent Labs    02/23/24 0547  WBC 9.9  RBC 3.59*  HCT 29.2*  PLT 313   No results for input(s): "NA", "K", "CL", "CO2", "BUN", "CREATININE", "GLUCOSE", "CALCIUM" in the last 72 hours.  No results for input(s): "LABPT", "INR" in the last 72 hours.   EXAM General - Patient is Alert, Appropriate, and Oriented Extremity - Neurovascular intact Dorsiflexion/Plantar flexion intact Incision: dressing C/D/I, no drainage, and Prevena intact without drainage No cellulitis present Compartment soft Dressing/Incision - Prevena intact with good seal. Motor Function - intact, moving foot and toes well on exam.    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   Bladder leak    Cardiac murmur    Chest pain    a.) 10/07/2008 Neg Dobuatmine Echo; b.) LHC 09/11/2011 - minor lum irregs; no sig CAD; c.) MV 06/02/2015: no ischemia; d.) cCTA 02/2021: Ca2+ = 0. Normal coronaries   CKD (chronic kidney disease), stage III (HCC)    COPD (chronic obstructive pulmonary disease) (HCC)    Depression    Diet-controlled type 2 diabetes mellitus (HCC)    Dyspnea    Essential hypertension    GERD (gastroesophageal reflux disease)    Glaucoma    History of kidney stones    History of left heart catheterization (LHC) 09/11/2011   a.) LHC 09/11/2011: minor luminal irregulaties mLAD, mLCx, mRCA - med mgmt.   Hyperlipidemia    Insomnia    a.) takes suvorexant   Long term current use of aspirin    MI (myocardial infarction) (HCC)    a.) in the 1990s; details unclear   Migraine with aura    Neuropathy    OSA on CPAP    Rotator cuff injury    Seasonal allergies    Tendinitis of right rotator cuff    TIA (transient ischemic attack)     Assessment/Plan: 4 Days Post-Op Procedure(s) (LRB): ARTHROPLASTY, HIP, TOTAL,POSTERIOR APPROACH (Right) Principal Problem:   Status post total hip  replacement, right  Estimated body mass index is 47.47 kg/m as calculated from the following:   Height as of 02/19/24: 5\' 3"  (1.6 m).   Weight as of 02/19/24: 121.6 kg. Advance diet Up with therapy  Vital signs are stable Labs are stable Pain well-controlled Continue to work on bowel movement.   Move on to FLEET enema if needed today. Care management to assist with discharge to skilled nursing facility.  Plan for discharge once insurance approval in obtained and bed available.  Discharge home with Prevena, change to dry dressing in 5-7 days.  DVT Prophylaxis -   Eliquis , SCDs Weight-Bearing as tolerated to right leg  J. Edie Goon, PA-C [facility] 02/25/2024, 7:29 AM

## 2024-02-25 NOTE — Progress Notes (Signed)
 Physical Therapy Treatment Patient Details Name: Kristine Rangel MRN: [id number] DOB: [birth year] Today's Date: 02/25/2024   History of Present Illness Pt is 68 yo female s/p R THA on 02/21/24. PMH of asthma, COPD, former smoker, past MI, HTN, anxiety. depression, CVA, s/p brain surgery (anuerysm repair), DMII, renal disease, CHF, rotator cuff surgery, glaucoma, R TKA revision, R TSA 08/02/23.    PT Comments  Pt ready for session.  To EOB then transferred to [facility] then around bed to chair.  Generally min a for all activity.  She continues with generally slow but steady gait.  Some increased pain today noted but remains motivated in session.  Remained in recliner after session with needs met.   If plan is discharge home, recommend the following: Assist for transportation;Help with stairs or ramp for entrance;A little help with walking and/or transfers;A little help with bathing/dressing/bathroom   Can travel by private vehicle     No  Equipment Recommendations  Other (comment) (defer to next venue)    Recommendations for Other Services       Precautions / Restrictions Precautions Precautions: Fall;Posterior Hip Restrictions Weight Bearing Restrictions Per Provider Order: Yes RLE Weight Bearing Per Provider Order: Weight bearing as tolerated     Mobility  Bed Mobility Overal bed mobility: Needs Assistance Bed Mobility: Supine to Sit     Supine to sit: Min assist, HOB elevated, Used rails     General bed mobility comments: less assist today overall Patient Response: Cooperative  Transfers Overall transfer level: Needs assistance Equipment used: Rolling walker (2 wheels)   Sit to Stand: Contact guard assist, Min assist                Ambulation/Gait Ambulation/Gait assistance: Contact guard assist Gait Distance (Feet): 15 Feet Assistive device: Rolling walker (2 wheels) Gait Pattern/deviations: Decreased step length - left, Decreased stride length, Decreased step  length - right Gait velocity: decreased     General Gait Details: our left side of bed then walks around to recliner after using [facility]   Stairs             Wheelchair Mobility     Tilt Bed Tilt Bed Patient Response: Cooperative  Modified Rankin (Stroke Patients Only)       Balance Overall balance assessment: Needs assistance Sitting-balance support: Bilateral upper extremity supported, Feet unsupported Sitting balance-Leahy Scale: Fair     Standing balance support: Bilateral upper extremity supported, During functional activity, Reliant on assistive device for balance, Single extremity supported Standing balance-Leahy Scale: Fair                              Communication    Cognition Arousal: Alert Behavior During Therapy: WFL for tasks assessed/performed   PT - Cognitive impairments: No apparent impairments                                Cueing    Exercises Other Exercises Other Exercises: seated AROM Other Exercises: BSC to void    General Comments        Pertinent Vitals/Pain Pain Assessment Pain Assessment: Faces Faces Pain Scale: Hurts even more Pain Location: R Hip Pain Descriptors / Indicators: Grimacing, Discomfort, Guarding Pain Intervention(s): Limited activity within patient's tolerance, Premedicated before session, Repositioned, Patient requesting pain meds-RN notified    Home Living  Prior Function            PT Goals (current goals can now be found in the care plan section) Progress towards PT goals: Progressing toward goals    Frequency    BID      PT Plan      Co-evaluation              AM-PAC PT "6 Clicks" Mobility   Outcome Measure  Help needed turning from your back to your side while in a flat bed without using bedrails?: A Little Help needed moving from lying on your back to sitting on the side of a flat bed without using bedrails?: A Lot Help  needed moving to and from a bed to a chair (including a wheelchair)?: A Little Help needed standing up from a chair using your arms (e.g., wheelchair or bedside chair)?: A Little Help needed to walk in hospital room?: A Little Help needed climbing 3-5 steps with a railing? : Total 6 Click Score: 15    End of Session Equipment Utilized During Treatment: Gait belt Activity Tolerance: Patient tolerated treatment well;Patient limited by fatigue Patient left: in chair;with chair alarm set;with call bell/phone within reach (in handoff to OT) Nurse Communication: Mobility status PT Visit Diagnosis: Unsteadiness on feet (R26.81);Other abnormalities of gait and mobility (R26.89);Muscle weakness (generalized) (M62.81);Difficulty in walking, not elsewhere classified (R26.2);Pain Pain - Right/Left: Right Pain - part of body: Hip     Time: 0901-0930 PT Time Calculation (min) (ACUTE ONLY): 29 min  Charges:    $Gait Training: 8-22 mins $Therapeutic Activity: 8-22 mins PT General Charges $$ ACUTE PT VISIT: 1 Visit                   Charlanne Cong, PTA 02/25/24, 10:55 AM

## 2024-02-26 DIAGNOSIS — M1611 Unilateral primary osteoarthritis, right hip: Secondary | ICD-10-CM | POA: Diagnosis not present

## 2024-02-26 NOTE — Plan of Care (Signed)

## 2024-02-26 NOTE — Progress Notes (Signed)
 Physical Therapy Treatment Patient Details Name: Kristine Rangel MRN: [id number] DOB: 02/15/1956 Today's Date: 02/26/2024   History of Present Illness Pt is 68 yo female s/p R THA on 02/21/24. PMH of asthma, COPD, former smoker, past MI, HTN, anxiety. depression, CVA, s/p brain surgery (anuerysm repair), DMII, renal disease, CHF, rotator cuff surgery, glaucoma, R TKA revision, R TSA 08/02/23.    PT Comments  Pt in chair, ready to get back to bed.  Stood with cga x 1 and is able to walk around bed to bathroom, void with assist for care then back to bed with mod a x 1 for LE's.  Gait distances remain limited due to tolerance ad continues to need assist for bed mobility.     If plan is discharge home, recommend the following: Assist for transportation;Help with stairs or ramp for entrance;A little help with walking and/or transfers;A little help with bathing/dressing/bathroom   Can travel by private vehicle        Equipment Recommendations       Recommendations for Other Services       Precautions / Restrictions Precautions Precautions: Fall;Posterior Hip Restrictions Weight Bearing Restrictions Per Provider Order: Yes RLE Weight Bearing Per Provider Order: Weight bearing as tolerated     Mobility  Bed Mobility Overal bed mobility: Needs Assistance Bed Mobility: Sit to Supine     Supine to sit: Min assist, HOB elevated, Used rails Sit to supine: Min assist, Mod assist   General bed mobility comments: For LE's back onto bed Patient Response: Cooperative  Transfers Overall transfer level: Needs assistance Equipment used: Rolling walker (2 wheels) Transfers: Sit to/from Stand Sit to Stand: Contact guard assist                Ambulation/Gait Ambulation/Gait assistance: Contact guard assist Gait Distance (Feet): 25 Feet Assistive device: Rolling walker (2 wheels) Gait Pattern/deviations: Decreased step length - left, Decreased stride length, Decreased step length -  right Gait velocity: decreased     General Gait Details: 74' 5'   Stairs             Wheelchair Mobility     Tilt Bed Tilt Bed Patient Response: Cooperative  Modified Rankin (Stroke Patients Only)       Balance Overall balance assessment: Needs assistance Sitting-balance support: Feet unsupported Sitting balance-Leahy Scale: Good     Standing balance support: Bilateral upper extremity supported, During functional activity, Reliant on assistive device for balance, Single extremity supported Standing balance-Leahy Scale: Fair                              Hotel manager: No apparent difficulties  Cognition Arousal: Alert Behavior During Therapy: WFL for tasks assessed/performed   PT - Cognitive impairments: No apparent impairments                         Following commands: Intact      Cueing Cueing Techniques: Verbal cues, Visual cues  Exercises Other Exercises Other Exercises: to bathroom to void.  bSC over commode and encouaraged to walk in with nursing staff vs BSC    General Comments        Pertinent Vitals/Pain Pain Assessment Pain Assessment: Faces Faces Pain Scale: Hurts a little bit Pain Location: R Hip Pain Descriptors / Indicators: Grimacing, Discomfort, Guarding Pain Intervention(s): Monitored during session, Repositioned    Home Living  Prior Function            PT Goals (current goals can now be found in the care plan section) Progress towards PT goals: Progressing toward goals    Frequency    BID      PT Plan      Co-evaluation              AM-PAC PT "6 Clicks" Mobility   Outcome Measure  Help needed turning from your back to your side while in a flat bed without using bedrails?: A Little Help needed moving from lying on your back to sitting on the side of a flat bed without using bedrails?: A Little Help needed moving to and  from a bed to a chair (including a wheelchair)?: A Little Help needed standing up from a chair using your arms (e.g., wheelchair or bedside chair)?: A Little Help needed to walk in hospital room?: A Little Help needed climbing 3-5 steps with a railing? : Total 6 Click Score: 16    End of Session Equipment Utilized During Treatment: Gait belt Activity Tolerance: Patient tolerated treatment well;Patient limited by fatigue Patient left: in chair;with chair alarm set;with call bell/phone within reach Nurse Communication: Mobility status PT Visit Diagnosis: Unsteadiness on feet (R26.81);Other abnormalities of gait and mobility (R26.89);Muscle weakness (generalized) (M62.81);Difficulty in walking, not elsewhere classified (R26.2);Pain Pain - Right/Left: Right Pain - part of body: Hip     Time: 1250-1302 PT Time Calculation (min) (ACUTE ONLY): 12 min  Charges:    $Gait Training: 8-22 mins PT General Charges $$ ACUTE PT VISIT: 1 Visit                   Charlanne Cong, PTA 02/26/24, 1:08 PM

## 2024-02-26 NOTE — Progress Notes (Signed)
 Occupational Therapy Treatment Patient Details Name: Kristine Rangel MRN: [id number] DOB: 11-Nov-1956 Today's Date: 02/26/2024   History of present illness Pt is 68 yo female s/p R THA on 02/21/24. PMH of asthma, COPD, former smoker, past MI, HTN, anxiety. depression, CVA, s/p brain surgery (anuerysm repair), DMII, renal disease, CHF, rotator cuff surgery, glaucoma, R TKA revision, R TSA 08/02/23.   OT comments  Pt seen for OT treatment on this date. Upon arrival to room pt seated in recliner, agreeable to tx. Demonstrates good recall of hip precautions. Pt amb to sink within room to complete grooming tasks. Pt completed UB/face/neck bathing while seated at the sink, set up required for bathing. Pt amb back to recliner with RW + CGA, slow yet steady gait throughout. Pt eager to make progress with therapy, endorses great family support. Pt making good progress toward goals, will continue to follow POC. Discharge recommendation remains appropriate.        If plan is discharge home, recommend the following:  A lot of help with walking and/or transfers;A lot of help with bathing/dressing/bathroom;Assist for transportation;Assistance with cooking/housework;Help with stairs or ramp for entrance   Equipment Recommendations  Other (comment)    Recommendations for Other Services      Precautions / Restrictions Precautions Precautions: Fall;Posterior Hip Precaution Booklet Issued: Yes (comment) Recall of Precautions/Restrictions: Intact Restrictions Weight Bearing Restrictions Per Provider Order: Yes RLE Weight Bearing Per Provider Order: Weight bearing as tolerated       Mobility Bed Mobility Overal bed mobility: Needs Assistance Bed Mobility: Sit to Supine       Sit to supine: Min assist, Mod assist   General bed mobility comments: For LE's back onto bed    Transfers Overall transfer level: Needs assistance Equipment used: Rolling walker (2 wheels) Transfers: Sit to/from Stand Sit  to Stand: Contact guard assist           General transfer comment: Verbal cues for technique during STS     Balance                                           ADL either performed or assessed with clinical judgement   ADL Overall ADL's : Needs assistance/impaired Eating/Feeding: Supervision/ safety   Grooming: Wash/dry hands;Wash/dry face;Sitting;Applying deodorant   Upper Body Bathing: Set up;Sitting (MINA for back)       Upper Body Dressing : Supervision/safety;Sitting       Toilet Transfer: Contact guard assist;Rolling walker (2 wheels);BSC/3in1 (Simulated toilet t/f amb <> recliner)           Functional mobility during ADLs: Contact guard assist;Rolling walker (2 wheels) General ADL Comments: Pt completed UB/face/neck bathing while seated at the sink, set up required for bathing. Pt amb back to recliner with RW + CGA    Extremity/Trunk Assessment              Vision       Perception     Praxis     Communication Communication Communication: No apparent difficulties   Cognition Arousal: Alert Behavior During Therapy: WFL for tasks assessed/performed Cognition: No apparent impairments             OT - Cognition Comments: A/Ox4                 Following commands: Intact        Cueing  Cueing Techniques: Verbal cues, Visual cues  Exercises Exercises: Other exercises Total Joint Exercises Long Arc Quad: Other (comment) Other Exercises Other Exercises: Edu: benefits of rehab, STS technique    Shoulder Instructions       General Comments Good spirits for rehab    Pertinent Vitals/ Pain       Pain Assessment Pain Assessment: 0-10 (Simultaneous filing. User may not have seen previous data.) Pain Score: 6  Faces Pain Scale: Hurts a little bit Pain Location: R Hip Pain Descriptors / Indicators: Grimacing, Discomfort, Guarding Pain Intervention(s): Limited activity within patient's tolerance, Monitored during  session, Repositioned (Simultaneous filing. User may not have seen previous data.)  Home Living                                          Prior Functioning/Environment              Frequency  Min 2X/week        Progress Toward Goals  OT Goals(current goals can now be found in the care plan section)  Progress towards OT goals: Progressing toward goals  Acute Rehab OT Goals Patient Stated Goal: to decrease pain and get stronger OT Goal Formulation: With patient Time For Goal Achievement: 03/08/24 Potential to Achieve Goals: Fair ADL Goals Pt Will Perform Grooming: with modified independence;standing Pt Will Perform Lower Body Dressing: with supervision;with adaptive equipment;sit to/from stand Pt Will Transfer to Toilet: with supervision;ambulating Pt Will Perform Toileting - Clothing Manipulation and hygiene: with supervision;sit to/from stand  Plan      Co-evaluation                 AM-PAC OT "6 Clicks" Daily Activity     Outcome Measure   Help from another person eating meals?: None Help from another person taking care of personal grooming?: None Help from another person toileting, which includes using toliet, bedpan, or urinal?: A Lot Help from another person bathing (including washing, rinsing, drying)?: A Little Help from another person to put on and taking off regular upper body clothing?: A Little Help from another person to put on and taking off regular lower body clothing?: A Lot 6 Click Score: 18    End of Session Equipment Utilized During Treatment: Rolling walker (2 wheels)  OT Visit Diagnosis: Unsteadiness on feet (R26.81);Repeated falls (R29.6);Muscle weakness (generalized) (M62.81)   Activity Tolerance Patient tolerated treatment well   Patient Left in chair;with call bell/phone within reach;with chair alarm set   Nurse Communication Mobility status        Time: 1610-9604 OT Time Calculation (min): 27  min  Charges: OT General Charges $OT Visit: 1 Visit OT Treatments $Self Care/Home Management : 23-37 mins  Rosaria Common M.S. OTR/L  02/26/24, 1:58 PM

## 2024-02-26 NOTE — Progress Notes (Signed)
 Physical Therapy Treatment Patient Details Name: Kristine Rangel MRN: [id number] DOB: 1956-05-20 Today's Date: 02/26/2024   History of Present Illness Pt is 68 yo female s/p R THA on 02/21/24. PMH of asthma, COPD, former smoker, past MI, HTN, anxiety. depression, CVA, s/p brain surgery (anuerysm repair), DMII, renal disease, CHF, rotator cuff surgery, glaucoma, R TKA revision, R TSA 08/02/23.    PT Comments  Pt ready for session.  To EOB with light assist for RLE.  Stands and walks to bathroom to void, out to recliner then after seated rest, she is able to make another lap to door and back.  Pt progressing gait well today with less pain and increased distance today.  Remained in recliner after session.    If plan is discharge home, recommend the following: Assist for transportation;Help with stairs or ramp for entrance;A little help with walking and/or transfers;A little help with bathing/dressing/bathroom   Can travel by private vehicle        Equipment Recommendations       Recommendations for Other Services       Precautions / Restrictions Precautions Precautions: Fall;Posterior Hip Restrictions Weight Bearing Restrictions Per Provider Order: Yes RLE Weight Bearing Per Provider Order: Weight bearing as tolerated     Mobility  Bed Mobility Overal bed mobility: Needs Assistance Bed Mobility: Supine to Sit     Supine to sit: Min assist, HOB elevated, Used rails       Patient Response: Cooperative  Transfers Overall transfer level: Needs assistance Equipment used: Rolling walker (2 wheels) Transfers: Sit to/from Stand Sit to Stand: Contact guard assist                Ambulation/Gait Ambulation/Gait assistance: Contact guard assist Gait Distance (Feet): 25 Feet Assistive device: Rolling walker (2 wheels) Gait Pattern/deviations: Decreased step length - left, Decreased stride length, Decreased step length - right Gait velocity: decreased     General Gait  Details: 5' 25' 20'   Stairs             Wheelchair Mobility     Tilt Bed Tilt Bed Patient Response: Cooperative  Modified Rankin (Stroke Patients Only)       Balance Overall balance assessment: Needs assistance Sitting-balance support: Bilateral upper extremity supported, Feet unsupported Sitting balance-Leahy Scale: Good     Standing balance support: Bilateral upper extremity supported, During functional activity, Reliant on assistive device for balance, Single extremity supported Standing balance-Leahy Scale: Fair                              Hotel manager: No apparent difficulties  Cognition Arousal: Alert Behavior During Therapy: WFL for tasks assessed/performed   PT - Cognitive impairments: No apparent impairments                         Following commands: Intact      Cueing Cueing Techniques: Verbal cues, Visual cues  Exercises Other Exercises Other Exercises: to bathroom to void.  bSC over commode and encouaraged to walk in with nursing staff vs BSC    General Comments        Pertinent Vitals/Pain Pain Assessment Pain Assessment: Faces Faces Pain Scale: Hurts little more Pain Location: R Hip Pain Descriptors / Indicators: Grimacing, Discomfort, Guarding Pain Intervention(s): Limited activity within patient's tolerance, Monitored during session, Repositioned    Home Living  Prior Function            PT Goals (current goals can now be found in the care plan section) Progress towards PT goals: Progressing toward goals    Frequency    BID      PT Plan      Co-evaluation              AM-PAC PT "6 Clicks" Mobility   Outcome Measure  Help needed turning from your back to your side while in a flat bed without using bedrails?: A Little Help needed moving from lying on your back to sitting on the side of a flat bed without using bedrails?: A  Little Help needed moving to and from a bed to a chair (including a wheelchair)?: A Little Help needed standing up from a chair using your arms (e.g., wheelchair or bedside chair)?: A Little   Help needed climbing 3-5 steps with a railing? : Total 6 Click Score: 13    End of Session Equipment Utilized During Treatment: Gait belt Activity Tolerance: Patient tolerated treatment well;Patient limited by fatigue Patient left: in chair;with chair alarm set;with call bell/phone within reach Nurse Communication: Mobility status PT Visit Diagnosis: Unsteadiness on feet (R26.81);Other abnormalities of gait and mobility (R26.89);Muscle weakness (generalized) (M62.81);Difficulty in walking, not elsewhere classified (R26.2);Pain Pain - Right/Left: Right Pain - part of body: Hip     Time: 1610-9604 PT Time Calculation (min) (ACUTE ONLY): 20 min  Charges:    $Gait Training: 8-22 mins PT General Charges $$ ACUTE PT VISIT: 1 Visit                   Charlanne Cong, PTA 02/26/24, 9:33 AM

## 2024-02-26 NOTE — Care Management Obs Status (Signed)
 MEDICARE OBSERVATION STATUS NOTIFICATION   Patient Details  Name: Kristine Rangel MRN: [id number] Date of Birth: July 15, 1956   Medicare Observation Status Notification Given:  Rudolph Cost, CMA 02/26/2024, 2:20 PM

## 2024-02-26 NOTE — Progress Notes (Signed)
 Subjective: 5 Days Post-Op Procedure(s) (LRB): ARTHROPLASTY, HIP, TOTAL,POSTERIOR APPROACH (Right) Patient reports pain as mild.  Pain improving Patient is well, and has had no acute complaints or problems Plan is to go Skilled nursing facility after hospital stay. Negative for chest pain and shortness of breath Fever: no Gastrointestinal:Negative for nausea and vomiting She has had a BM.   Objective: Vital signs in last 24 hours: Temp:  [98.3 F (36.8 C)-98.5 F (36.9 C)] 98.3 F (36.8 C) (04/15 0820) Pulse Rate:  [72-83] 83 (04/15 0820) Resp:  [17] 17 (04/15 0820) BP: (94-120)/(48-62) 99/48 (04/15 0820) SpO2:  [97 %-98 %] 98 % (04/15 0524)  Intake/Output from previous day:  Intake/Output Summary (Last 24 hours) at 02/26/2024 1026 Last data filed at 02/25/2024 1900 Gross per 24 hour  Intake 360 ml  Output --  Net 360 ml    Intake/Output this shift: No intake/output data recorded.  Labs: No results for input(s): "HGB" in the last 72 hours.  No results for input(s): "WBC", "RBC", "HCT", "PLT" in the last 72 hours.  No results for input(s): "NA", "K", "CL", "CO2", "BUN", "CREATININE", "GLUCOSE", "CALCIUM" in the last 72 hours.  No results for input(s): "LABPT", "INR" in the last 72 hours.   EXAM General - Patient is Alert, Appropriate, and Oriented Extremity - Neurovascular intact Dorsiflexion/Plantar flexion intact Incision: dressing C/D/I, no drainage, and Prevena intact without drainage No cellulitis present Compartment soft Dressing/Incision - Prevena intact with good seal. Motor Function - intact, moving foot and toes well on exam.    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   Bladder leak    Cardiac murmur    Chest pain    a.) 10/07/2008 Neg Dobuatmine Echo; b.) LHC 09/11/2011 - minor lum irregs; no sig CAD; c.) MV 06/02/2015: no ischemia; d.) cCTA 02/2021: Ca2+ = 0. Normal coronaries   CKD (chronic kidney disease), stage III (HCC)    COPD (chronic obstructive pulmonary disease) (HCC)    Depression    Diet-controlled type 2 diabetes mellitus (HCC)    Dyspnea    Essential hypertension    GERD (gastroesophageal reflux disease)    Glaucoma    History of kidney stones    History of left heart catheterization (LHC) 09/11/2011   a.) LHC 09/11/2011: minor luminal irregulaties mLAD, mLCx, mRCA - med mgmt.   Hyperlipidemia    Insomnia    a.) takes suvorexant   Long term current use of aspirin    MI (myocardial infarction) (HCC)    a.) in the 1990s; details unclear   Migraine with aura    Neuropathy    OSA on CPAP    Rotator cuff injury    Seasonal allergies    Tendinitis of right rotator cuff    TIA (transient ischemic attack)     Assessment/Plan: 5 Days Post-Op Procedure(s) (LRB): ARTHROPLASTY, HIP, TOTAL,POSTERIOR APPROACH (Right) Principal Problem:   Status post total hip replacement, right  Estimated body mass index is 47.47 kg/m  as calculated from the following:   Height as of 02/19/24: 5\' 3"  (1.6 m).   Weight as of 02/19/24: 121.6 kg. Advance diet Up with therapy  Vital signs are stable Labs are stable Pain well-controlled She has had a BM at this time. Care management to assist with discharge to skilled nursing facility.  Plan for discharge once insurance approval in obtained and bed available.  Continue with Prevena for now.  May change to a dry dressing at discharge pending when her discharge occurs to SNF.  DVT Prophylaxis -  Eliquis ,  SCDs Weight-Bearing as tolerated to right leg  J. Edie Goon, PA-C [facility] 02/26/2024, 10:26 AM

## 2024-02-27 DIAGNOSIS — M1611 Unilateral primary osteoarthritis, right hip: Secondary | ICD-10-CM | POA: Diagnosis not present

## 2024-02-27 NOTE — Progress Notes (Signed)
 Subjective: 6 Days Post-Op Procedure(s) (LRB): ARTHROPLASTY, HIP, TOTAL,POSTERIOR APPROACH (Right) Patient reports pain as mild.  Pain improving Patient is well, and has had no acute complaints or problems Plan is to go Skilled nursing facility after hospital stay. Negative for chest pain and shortness of breath Fever: no Gastrointestinal:Negative for nausea and vomiting She has had a BM.   Objective: Vital signs in last 24 hours: Temp:  [97.9 F (36.6 C)-98.3 F (36.8 C)] 98.2 F (36.8 C) (04/16 0747) Pulse Rate:  [77-83] 77 (04/16 0747) Resp:  [15-18] 16 (04/16 0747) BP: (99-128)/(48-55) 128/54 (04/16 0747) SpO2:  [95 %-100 %] 95 % (04/16 0747)  Intake/Output from previous day:  Intake/Output Summary (Last 24 hours) at 02/27/2024 0754 Last data filed at 02/26/2024 1500 Gross per 24 hour  Intake 480 ml  Output --  Net 480 ml    Intake/Output this shift: No intake/output data recorded.  Labs: No results for input(s): "HGB" in the last 72 hours.  No results for input(s): "WBC", "RBC", "HCT", "PLT" in the last 72 hours.  No results for input(s): "NA", "K", "CL", "CO2", "BUN", "CREATININE", "GLUCOSE", "CALCIUM" in the last 72 hours.  No results for input(s): "LABPT", "INR" in the last 72 hours.   EXAM General - Patient is Alert, Appropriate, and Oriented Extremity - Neurovascular intact Dorsiflexion/Plantar flexion intact Incision: dressing C/D/I, no drainage, and Prevena intact without drainage No cellulitis present Compartment soft Dressing/Incision - Prevena was removed this AM, no drainage noted.  Honeycomb dressing applied. Motor Function - intact, moving foot and toes well on exam.    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   Bladder leak    Cardiac murmur    Chest pain    a.) 10/07/2008 Neg Dobuatmine Echo; b.) LHC 09/11/2011 - minor lum irregs; no sig CAD; c.) MV 06/02/2015: no ischemia; d.) cCTA 02/2021: Ca2+ = 0. Normal coronaries   CKD (chronic kidney disease), stage III (HCC)    COPD (chronic obstructive pulmonary disease) (HCC)    Depression    Diet-controlled type 2 diabetes mellitus (HCC)    Dyspnea    Essential hypertension    GERD (gastroesophageal reflux disease)    Glaucoma    History of kidney stones    History of left heart catheterization (LHC) 09/11/2011   a.) LHC 09/11/2011: minor luminal irregulaties mLAD, mLCx, mRCA - med mgmt.   Hyperlipidemia    Insomnia    a.) takes suvorexant   Long term current use of aspirin    MI (myocardial infarction) (HCC)    a.) in the 1990s; details unclear   Migraine with aura    Neuropathy    OSA on CPAP    Rotator cuff injury    Seasonal allergies    Tendinitis of right rotator cuff    TIA (transient ischemic attack)     Assessment/Plan: 6 Days Post-Op Procedure(s) (LRB): ARTHROPLASTY, HIP, TOTAL,POSTERIOR APPROACH (Right) Principal Problem:   Status post total hip replacement, right  Estimated body mass index is 47.47 kg/m as calculated from the following:   Height as of 02/19/24: 5\' 3"  (1.6 m).   Weight as of 02/19/24: 121.6 kg. Advance diet Up with therapy  Vital signs are stable Labs are stable Pain well-controlled She has had a BM at this time. Care management to assist with discharge to skilled nursing facility.  Plan for discharge once insurance approval in obtained and bed available. Removed Prevena and applied a honeycomb dressing this AM.  Extra dressing in patient's  chart.  DVT Prophylaxis -  Eliquis , SCDs Weight-Bearing as tolerated to right leg  J. Edie Goon, PA-C [facility] 02/27/2024, 7:54 AM

## 2024-02-27 NOTE — TOC Progression Note (Signed)
 Transition of Care [facility]) - Progression Note    Patient Details  Name: DASHANNA KINNAMON MRN: [id number] Date of Birth: [birth year]  Transition of [facility]) CM/SW Contact  Crayton Docker, RN 02/27/2024, 9:01 AM  Clinical Narrative:     Noted, accepted SNF offer from [facility], [place]. CM call to Admissions, [facility], phone: [phone] regarding SNF bed offer. No answer, CM left message fro return call.    Expected Discharge Plan and Services      SNF    Social Determinants of Health (SDOH) Interventions SDOH Screenings   Food Insecurity: Food Insecurity Present (02/21/2024)  Housing: High Risk (02/21/2024)  Transportation Needs: Unmet Transportation Needs (02/21/2024)  Utilities: Not At Risk (02/21/2024)  Financial Resource Strain: Low Risk  (11/30/2023)   Received from [facility]  Social Connections: Moderately Integrated (02/21/2024)  Tobacco Use: High Risk (02/21/2024)    Readmission Risk Interventions     No data to display

## 2024-02-27 NOTE — Plan of Care (Signed)

## 2024-02-27 NOTE — Progress Notes (Signed)
 PT Cancellation Note  Patient Details Name: NEETU CARROZZA MRN: [id number] DOB: [birth year]   Cancelled Treatment:     Multiple attempts made this afternoon. Pt unable to participate. Acute PT will continue to follow and progress per current POC. Author will return first thing in the morning as requested.    Koleen Perna 02/27/2024, 5:16 PM

## 2024-02-27 NOTE — Progress Notes (Signed)
 Pt did not want BP meds due to BP 100/45. Pt stated she does not want it (BP) to drop any lower. Photographer.

## 2024-02-27 NOTE — Progress Notes (Signed)
 Physical Therapy Treatment Patient Details Name: Kristine Rangel MRN: [id number] DOB: [birth year] Today's Date: 02/27/2024   History of Present Illness Pt is 68 yo female s/p R THA on 02/21/24. PMH of asthma, COPD, former smoker, past MI, HTN, anxiety. depression, CVA, s/p brain surgery (anuerysm repair), DMII, renal disease, CHF, rotator cuff surgery, glaucoma, R TKA revision, R TSA 08/02/23.    PT Comments  Pt was long sitting in bed upon arrival. She is A and O x 4 and remains cooperative and motivated throughout. She continues to require extensive assistance to safely exit bed, stand to RW, and tolerate ambulation ~ 40 ft total. Pt very fatigued with increased pain endorsed after ambulation. Overall, pt continues to demonstrate improvements however still not at baseline abilities. Recommend STR/SNF placement to maximize her safe functional mobility while decreasing caregiver burden.    If plan is discharge home, recommend the following: Assist for transportation;Help with stairs or ramp for entrance;A little help with walking and/or transfers;A little help with bathing/dressing/bathroom     Equipment Recommendations  Other (comment) (Defer to next level of care)       Precautions / Restrictions Precautions Precautions: Fall;Posterior Hip Precaution Booklet Issued: Yes (comment) Recall of Precautions/Restrictions: Intact Restrictions Weight Bearing Restrictions Per Provider Order: Yes RLE Weight Bearing Per Provider Order: Weight bearing as tolerated     Mobility  Bed Mobility Overal bed mobility: Needs Assistance Bed Mobility: Supine to Sit Supine to sit: Mod assist, HOB elevated, Used rails  General bed mobility comments: mod assist required to achieve EOB sitting with vcs for technique and sequencing improvements. pt able to recall 2 of 3 precautions    Transfers Overall transfer level: Needs assistance Equipment used: Rolling walker (2 wheels) Transfers: Sit to/from Stand Sit  to Stand: Contact guard assist  General transfer comment: CGA for safety with vcs for technique improvements    Ambulation/Gait Ambulation/Gait assistance: Contact guard assist Gait Distance (Feet): 40 Feet Assistive device: Rolling walker (2 wheels) Gait Pattern/deviations: Antalgic, Step-to pattern, Trunk flexed Gait velocity: decreased  General Gait Details: pt was able to advance gait to 40 ft but distance is limited by fatigue/pain    Balance Overall balance assessment: Needs assistance Sitting-balance support: Feet unsupported Sitting balance-Leahy Scale: Good     Standing balance support: Bilateral upper extremity supported, During functional activity, Reliant on assistive device for balance Standing balance-Leahy Scale: Fair Standing balance comment: reliant on RW during dynamic task       Communication Communication Communication: No apparent difficulties  Cognition Arousal: Alert Behavior During Therapy: WFL for tasks assessed/performed   PT - Cognitive impairments: No apparent impairments      PT - Cognition Comments: very pleasant, motivated to participate Following commands: Intact      Cueing Cueing Techniques: Verbal cues, Tactile cues     General Comments General comments (skin integrity, edema, etc.): reviewed HEP and pt state she will continue to perform. hip abduction pillow was replaced      Pertinent Vitals/Pain Pain Assessment Pain Assessment: 0-10 Pain Score: 7  Pain Location: R Hip Pain Descriptors / Indicators: Grimacing, Discomfort, Guarding Pain Intervention(s): Limited activity within patient's tolerance, Monitored during session, Premedicated before session, Repositioned, Ice applied     PT Goals (current goals can now be found in the care plan section) Acute Rehab PT Goals Patient Stated Goal: to get stronger and get back home with PCA. Progress towards PT goals: Progressing toward goals    Frequency    BID  AM-PAC  PT "6 Clicks" Mobility   Outcome Measure  Help needed turning from your back to your side while in a flat bed without using bedrails?: A Little Help needed moving from lying on your back to sitting on the side of a flat bed without using bedrails?: A Little Help needed moving to and from a bed to a chair (including a wheelchair)?: A Little Help needed standing up from a chair using your arms (e.g., wheelchair or bedside chair)?: A Little Help needed to walk in hospital room?: A Little Help needed climbing 3-5 steps with a railing? : Total 6 Click Score: 16    End of Session   Activity Tolerance: Patient tolerated treatment well Patient left: in chair;with chair alarm set;with call bell/phone within reach Nurse Communication: Mobility status PT Visit Diagnosis: Unsteadiness on feet (R26.81);Other abnormalities of gait and mobility (R26.89);Muscle weakness (generalized) (M62.81);Difficulty in walking, not elsewhere classified (R26.2);Pain Pain - Right/Left: Right Pain - part of body: Hip     Time: 0981-1914 PT Time Calculation (min) (ACUTE ONLY): 17 min  Charges:    $Therapeutic Activity: 8-22 mins PT General Charges $$ ACUTE PT VISIT: 1 Visit                    Chester Costa PTA 02/27/24, 2:13 PM

## 2024-02-28 DIAGNOSIS — M1611 Unilateral primary osteoarthritis, right hip: Secondary | ICD-10-CM | POA: Diagnosis not present

## 2024-02-28 NOTE — Progress Notes (Signed)
 Occupational Therapy Treatment Patient Details Name: Kristine Rangel MRN: [id number] DOB: [birth year] Today's Date: 02/28/2024   History of present illness Pt is 68 yo female s/p R THA on 02/21/24. PMH of asthma, COPD, former smoker, past MI, HTN, anxiety. depression, CVA, s/p brain surgery (anuerysm repair), DMII, renal disease, CHF, rotator cuff surgery, glaucoma, R TKA revision, R TSA 08/02/23.   OT comments  Pt seen for OT treatment on this date. Upon arrival to room pt resting in bed, agreeable to tx. Pt initially declined OOB mobility, with minimally encouragement she agreed to use the BR. Pt requires CGA throughout mobility during session. Pt demonstrated fair balance with amb with RW + CGA, no noted LOB. Pt completed pericare MODI, handwashing at sink level with CGA. She retired in bed with all needs in reach, MODA for LE assistance to return to bed. Pt reports she is eager to rest. Pt making good progress toward goals, will continue to follow POC. Discharge recommendation remains appropriate.        If plan is discharge home, recommend the following:  A lot of help with walking and/or transfers;A lot of help with bathing/dressing/bathroom;Assist for transportation;Assistance with cooking/housework;Help with stairs or ramp for entrance   Equipment Recommendations  Other (comment)    Recommendations for Other Services      Precautions / Restrictions Precautions Precautions: Fall;Posterior Hip Precaution Booklet Issued: Yes (comment) Recall of Precautions/Restrictions: Intact Restrictions Weight Bearing Restrictions Per Provider Order: Yes RLE Weight Bearing Per Provider Order: Weight bearing as tolerated       Mobility Bed Mobility Overal bed mobility: Needs Assistance Bed Mobility: Supine to Sit, Sit to Supine     Supine to sit: Contact guard, Used rails, HOB elevated Sit to supine: Mod assist, Used rails (Bil LE management required)        Transfers Overall transfer  level: Needs assistance Equipment used: Rolling walker (2 wheels) Transfers: Sit to/from Stand, Bed to chair/wheelchair/BSC Sit to Stand: Contact guard assist           General transfer comment: CGA, improved safety awareness during transfers     Balance Overall balance assessment: Needs assistance Sitting-balance support: Feet unsupported Sitting balance-Leahy Scale: Good     Standing balance support: Bilateral upper extremity supported, Reliant on assistive device for balance, During functional activity Standing balance-Leahy Scale: Good Standing balance comment: Improved dynamic reaching OBS in standing with single extermitiy supported                           ADL either performed or assessed with clinical judgement   ADL Overall ADL's : Needs assistance/impaired Eating/Feeding: Modified independent   Grooming: Wash/dry hands;Standing                   Toilet Transfer: Contact guard assist;Ambulation;Comfort height toilet;Rolling walker (2 wheels);Grab bars   Toileting- Clothing Manipulation and Hygiene: Modified independent;Sit to/from stand       Functional mobility during ADLs: Contact guard assist;Rolling walker (2 wheels) General ADL Comments: Toileting with CGA throughout, modi pericare with no physical assistance    Extremity/Trunk Assessment              Vision       Perception     Praxis     Communication Communication Communication: No apparent difficulties   Cognition Arousal: Alert Behavior During Therapy: WFL for tasks assessed/performed Cognition: No apparent impairments  OT - Cognition Comments: A/Ox4                 Following commands: Intact        Cueing   Cueing Techniques: Verbal cues, Tactile cues  Exercises Exercises: Other exercises Other Exercises Other Exercises: Edu: review posterior hip precautions during ADLs    Shoulder Instructions       General Comments Pt reports  she is feeling tried this afternoon, "not feeling up to doing much"    Pertinent Vitals/ Pain       Pain Assessment Pain Assessment: Faces Faces Pain Scale: Hurts a little bit Pain Location: R Hip Pain Descriptors / Indicators: Discomfort Pain Intervention(s): Limited activity within patient's tolerance, Repositioned  Home Living                                          Prior Functioning/Environment              Frequency  Min 2X/week        Progress Toward Goals  OT Goals(current goals can now be found in the care plan section)  Progress towards OT goals: Progressing toward goals  Acute Rehab OT Goals Patient Stated Goal: to decrease pain and get stronger OT Goal Formulation: With patient Time For Goal Achievement: 03/08/24 Potential to Achieve Goals: Fair ADL Goals Pt Will Perform Grooming: with modified independence;standing Pt Will Perform Lower Body Dressing: with supervision;with adaptive equipment;sit to/from stand Pt Will Transfer to Toilet: with supervision;ambulating Pt Will Perform Toileting - Clothing Manipulation and hygiene: with supervision;sit to/from stand  Plan      Co-evaluation                 AM-PAC OT "6 Clicks" Daily Activity     Outcome Measure   Help from another person eating meals?: None Help from another person taking care of personal grooming?: None Help from another person toileting, which includes using toliet, bedpan, or urinal?: A Lot Help from another person bathing (including washing, rinsing, drying)?: A Little Help from another person to put on and taking off regular upper body clothing?: A Little Help from another person to put on and taking off regular lower body clothing?: A Lot 6 Click Score: 18    End of Session Equipment Utilized During Treatment: Rolling walker (2 wheels)  OT Visit Diagnosis: Unsteadiness on feet (R26.81);Repeated falls (R29.6);Muscle weakness (generalized) (M62.81)    Activity Tolerance Patient tolerated treatment well   Patient Left with call bell/phone within reach;in bed;with bed alarm set   Nurse Communication          Time: 2956-2130 OT Time Calculation (min): 18 min  Charges: OT General Charges $OT Visit: 1 Visit OT Treatments $Self Care/Home Management : 8-22 mins  Rosaria Common M.S. OTR/L  02/28/24, 1:55 PM

## 2024-02-28 NOTE — Progress Notes (Signed)
 Physical Therapy Treatment Patient Details Name: Kristine Rangel MRN: [id number] DOB: 1956-06-25 Today's Date: 02/28/2024   History of Present Illness Pt is 68 yo female s/p R THA on 02/21/24. PMH of asthma, COPD, former smoker, past MI, HTN, anxiety. depression, CVA, s/p brain surgery (anuerysm repair), DMII, renal disease, CHF, rotator cuff surgery, glaucoma, R TKA revision, R TSA 08/02/23.    PT Comments  Pt had just got OOB and dressed with RN tech upon arrival. She is A and O x 4 and extremely pleasant. She remains cooperative throughout session. Easily and safely able to stand form recliner to RW and tolerate lightly increased gait distance prior to needing seated rest. Pt continues to endorse pain however less antalgic gait pattern versus previous date. Overall pt continues to progress towards all PT goals. Dc recs remain appropriate. Pt hopeful to go to rehab this afternoon.    If plan is discharge home, recommend the following: Assist for transportation;Help with stairs or ramp for entrance;A little help with walking and/or transfers;A little help with bathing/dressing/bathroom     Equipment Recommendations  Other (comment) (Defer to next level of care)       Precautions / Restrictions Precautions Precautions: Fall;Posterior Hip Precaution Booklet Issued: Yes (comment) Recall of Precautions/Restrictions: Intact Restrictions Weight Bearing Restrictions Per Provider Order: Yes RLE Weight Bearing Per Provider Order: Weight bearing as tolerated     Mobility  Bed Mobility  General bed mobility comments: pt hs just got OOB in recliner prior to session. RN tech assisted her    Transfers Overall transfer level: Needs assistance Equipment used: Rolling walker (2 wheels) Transfers: Sit to/from Stand Sit to Stand: Contact guard assist  General transfer comment: CGA for safety. vcs for handplacement, reminders for hip precautions and sequencing cueing     Ambulation/Gait Ambulation/Gait assistance: Contact guard assist Gait Distance (Feet): 60 Feet Assistive device: Rolling walker (2 wheels) Gait Pattern/deviations: Antalgic, Step-to pattern, Trunk flexed Gait velocity: decreased  General Gait Details: pt demonstrated imporved gait safety and tolerance. no LOB but slow antalgic step to gait. Overall progressing towards goals     Balance Overall balance assessment: Needs assistance Sitting-balance support: Feet unsupported Sitting balance-Leahy Scale: Good     Standing balance support: Bilateral upper extremity supported, Reliant on assistive device for balance, During functional activity Standing balance-Leahy Scale: Good Standing balance comment: reliant on RW during dynamic task       Communication Communication Communication: No apparent difficulties  Cognition Arousal: Alert Behavior During Therapy: WFL for tasks assessed/performed   PT - Cognitive impairments: No apparent impairments    PT - Cognition Comments: very pleasant, motivated to participate Following commands: Intact      Cueing Cueing Techniques: Verbal cues, Tactile cues     General Comments General comments (skin integrity, edema, etc.): pt performed several HEP exercises without assistance. was encouraged to continue to perform throughout the day. Pt is hopeful to DC to SNF this afternoon      Pertinent Vitals/Pain Pain Assessment Pain Assessment: 0-10 Pain Score: 6  Pain Location: R Hip Pain Descriptors / Indicators: Grimacing, Discomfort, Guarding Pain Intervention(s): Limited activity within patient's tolerance, Monitored during session, Premedicated before session, Repositioned     PT Goals (current goals can now be found in the care plan section) Acute Rehab PT Goals Patient Stated Goal: To get stronger and get back home with PCA. Progress towards PT goals: Progressing toward goals    Frequency    BID  AM-PAC PT "6 Clicks"  Mobility   Outcome Measure  Help needed turning from your back to your side while in a flat bed without using bedrails?: A Little Help needed moving from lying on your back to sitting on the side of a flat bed without using bedrails?: A Little Help needed moving to and from a bed to a chair (including a wheelchair)?: A Little Help needed standing up from a chair using your arms (e.g., wheelchair or bedside chair)?: A Little Help needed to walk in hospital room?: A Little Help needed climbing 3-5 steps with a railing? : A Lot 6 Click Score: 17    End of Session   Activity Tolerance: Patient tolerated treatment well Patient left: in chair;with chair alarm set;with call bell/phone within reach Nurse Communication: Mobility status PT Visit Diagnosis: Unsteadiness on feet (R26.81);Other abnormalities of gait and mobility (R26.89);Muscle weakness (generalized) (M62.81);Difficulty in walking, not elsewhere classified (R26.2);Pain Pain - Right/Left: Right Pain - part of body: Hip     Time: 1027-2536 PT Time Calculation (min) (ACUTE ONLY): 18 min  Charges:    $Gait Training: 8-22 mins PT General Charges $$ ACUTE PT VISIT: 1 Visit                     Chester Costa PTA 02/28/24, 11:32 AM

## 2024-02-28 NOTE — Progress Notes (Signed)
 Subjective: 7 Days Post-Op Procedure(s) (LRB): ARTHROPLASTY, HIP, TOTAL,POSTERIOR APPROACH (Right) Patient reports pain as mild.  Pain improving Patient is well, and has had no acute complaints or problems Plan is to go Skilled nursing facility after hospital stay. Negative for chest pain and shortness of breath Fever: no Gastrointestinal:Negative for nausea and vomiting She has had a BM.   Objective: Vital signs in last 24 hours: Temp:  [97.6 F (36.4 C)-98.2 F (36.8 C)] 98.2 F (36.8 C) (04/17 0534) Pulse Rate:  [68-80] 68 (04/17 0534) Resp:  [16-20] 16 (04/17 0534) BP: (93-128)/(39-54) 119/39 (04/17 0534) SpO2:  [95 %-98 %] 96 % (04/17 0534)  Intake/Output from previous day:  Intake/Output Summary (Last 24 hours) at 02/28/2024 0711 Last data filed at 02/27/2024 1900 Gross per 24 hour  Intake 960 ml  Output --  Net 960 ml    Intake/Output this shift: No intake/output data recorded.  Labs: No results for input(s): "HGB" in the last 72 hours.  No results for input(s): "WBC", "RBC", "HCT", "PLT" in the last 72 hours.  No results for input(s): "NA", "K", "CL", "CO2", "BUN", "CREATININE", "GLUCOSE", "CALCIUM" in the last 72 hours.  No results for input(s): "LABPT", "INR" in the last 72 hours.   EXAM General - Patient is Alert, Appropriate, and Oriented Extremity - Neurovascular intact Dorsiflexion/Plantar flexion intact Incision: dressing C/D/I and no drainage No cellulitis present Compartment soft Dressing/Incision - Honeycomb intact to the right hip. Motor Function - intact, moving foot and toes well on exam.  Abdomen soft with intact bowel sounds this AM.   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   Bladder leak    Cardiac murmur    Chest pain    a.) 10/07/2008 Neg [place] Echo; b.) LHC 09/11/2011 - minor lum irregs; no sig CAD; c.) MV 06/02/2015: no ischemia; d.) cCTA 02/2021: Ca2+ = 0. Normal coronaries   CKD (chronic kidney disease), stage III (HCC)    COPD (chronic obstructive pulmonary disease) (HCC)    Depression    Diet-controlled type 2 diabetes mellitus (HCC)    Dyspnea    Essential hypertension    GERD (gastroesophageal reflux disease)    Glaucoma    History of kidney stones    History of left heart catheterization (LHC) 09/11/2011   a.) LHC 09/11/2011: minor luminal irregulaties mLAD, mLCx, mRCA - med mgmt.   Hyperlipidemia    Insomnia    a.) takes suvorexant   Long term current use of aspirin    MI (myocardial infarction) (HCC)    a.) in the 1990s; details unclear   Migraine with aura    Neuropathy    OSA on CPAP    Rotator cuff injury    Seasonal allergies    Tendinitis of right rotator cuff    TIA (transient ischemic attack)     Assessment/Plan: 7 Days Post-Op Procedure(s) (LRB): ARTHROPLASTY, HIP, TOTAL,POSTERIOR APPROACH (Right) Principal Problem:   Status post total hip replacement, right  Estimated body  mass index is 47.47 kg/m as calculated from the following:   Height as of 02/19/24: 5\' 3"  (1.6 m).   Weight as of 02/19/24: 121.6 kg. Advance diet Up with therapy  Vital signs are stable Pain well-controlled She has had a BM at this time. Care management to assist with discharge to skilled nursing facility.  Plan for discharge once insurance approval in obtained and bed available. Patient stable for discharge to SNF.  DVT Prophylaxis -  Eliquis , SCDs Weight-Bearing as tolerated to right leg  J. Edie Goon,  PA-C [facility] 02/28/2024, 7:11 AM

## 2024-02-28 NOTE — Progress Notes (Signed)
 PT Cancellation Note  Patient Details Name: Kristine Rangel MRN: [id number] DOB: May 07, 1956   Cancelled Treatment:     Author returned for BID session however pt politely request to rest at this time. Will attempt again later and continue to follow per current POC.    Koleen Perna 02/28/2024, 2:35 PM

## 2024-02-28 NOTE — Plan of Care (Signed)
   Problem: Education: Goal: Knowledge of General Education information will improve Description: Including pain rating scale, medication(s)/side effects and non-pharmacologic comfort measures Outcome: Progressing   Problem: Clinical Measurements: Goal: Will remain free from infection Outcome: Progressing Goal: Diagnostic test results will improve Outcome: Progressing
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
## 2024-02-29 DIAGNOSIS — M1611 Unilateral primary osteoarthritis, right hip: Secondary | ICD-10-CM | POA: Diagnosis not present

## 2024-02-29 NOTE — Progress Notes (Signed)
 Occupational Therapy Treatment Patient Details Name: Kristine Rangel MRN: [id number] DOB: [birth year] Today's Date: 02/29/2024   History of present illness Pt is 68 yo female s/p R THA on 02/21/24. PMH of asthma, COPD, former smoker, past MI, HTN, anxiety. depression, CVA, s/p brain surgery (anuerysm repair), DMII, renal disease, CHF, rotator cuff surgery, glaucoma, R TKA revision, R TSA 08/02/23.   OT comments  Pt seen for OT tx following PT session. Pt agreeable and eager to participate. Pt endorsing 7/10 pain at starting at the beginning of session, premedicated recently and reports it's coming down. Pt engaged in housekeeping tasks in room while maintaining precautions and incorporating learned pain coping strategies. Pt educated in IS with VC for technique with return demo. Pt continues to progress towards goals.       If plan is discharge home, recommend the following:      Equipment Recommendations  Other (comment) (defer)    Recommendations for Other Services      Precautions / Restrictions Precautions Precautions: Fall;Posterior Hip Precaution Booklet Issued: Yes (comment) Recall of Precautions/Restrictions: Intact Restrictions Weight Bearing Restrictions Per Provider Order: Yes RLE Weight Bearing Per Provider Order: Weight bearing as tolerated       Mobility Bed Mobility               General bed mobility comments: NT, in recliner    Transfers Overall transfer level: Needs assistance Equipment used: Rolling walker (2 wheels) Transfers: Sit to/from Stand Sit to Stand: Contact guard assist                 Balance Overall balance assessment: Needs assistance Sitting-balance support: Feet supported, No upper extremity supported Sitting balance-Leahy Scale: Good     Standing balance support: Bilateral upper extremity supported, Reliant on assistive device for balance, During functional activity Standing balance-Leahy Scale: Good                              ADL either performed or assessed with clinical judgement   ADL Overall ADL's : Needs assistance/impaired                                            Extremity/Trunk Assessment              Vision       Perception     Praxis     Communication     Cognition Arousal: Alert Behavior During Therapy: WFL for tasks assessed/performed Cognition: No apparent impairments             OT - Cognition Comments: A/Ox4                 Following commands: Intact        Cueing      Exercises Other Exercises Other Exercises: Edu on incentive spirometer with MIN VC for technique, return demo Other Exercises: Pt engaged in housekeeping tasks in room while maintaining precautions and incorporating learned pain coping strategies    Shoulder Instructions       General Comments      Pertinent Vitals/ Pain       Pain Assessment Pain Assessment: 0-10 Pain Score: 6  Pain Location: R hip, down from a  7 Pain Descriptors / Indicators: Discomfort, Aching Pain Intervention(s): Monitored during session, Premedicated before session, Repositioned  Home Living                                          Prior Functioning/Environment              Frequency  Min 2X/week        Progress Toward Goals  OT Goals(current goals can now be found in the care plan section)  Progress towards OT goals: Progressing toward goals  Acute Rehab OT Goals Patient Stated Goal: to decrease pain and get stronger OT Goal Formulation: With patient Time For Goal Achievement: 03/08/24 Potential to Achieve Goals: Good  Plan      Co-evaluation                 AM-PAC OT 6 Clicks Daily Activity     Outcome Measure   Help from another person eating meals?: None Help from another person taking care of personal grooming?: None Help from another person toileting, which includes using toliet, bedpan, or urinal?: A Lot Help from  another person bathing (including washing, rinsing, drying)?: A Little Help from another person to put on and taking off regular upper body clothing?: A Little Help from another person to put on and taking off regular lower body clothing?: A Lot 6 Click Score: 18    End of Session    OT Visit Diagnosis: Unsteadiness on feet (R26.81);Repeated falls (R29.6);Muscle weakness (generalized) (M62.81)   Activity Tolerance Patient tolerated treatment well   Patient Left in chair;with call bell/phone within reach;with chair alarm set   Nurse Communication          Time: 8976-8945 OT Time Calculation (min): 31 min  Charges: OT General Charges $OT Visit: 1 Visit OT Treatments $Therapeutic Activity: 23-37 mins  Warren SAUNDERS., MPH, MS, OTR/L ascom [phone] 02/29/24, 10:56 AM

## 2024-02-29 NOTE — TOC Progression Note (Signed)
 Transition of Care [facility]) - Progression Note    Patient Details  Name: JANAT TABBERT MRN: [id number] Date of Birth: Jul 06, 1956  Transition of Care [facility]) CM/SW Contact  Crayton Docker, RN Phone Number: 02/29/2024, 2:52 PM  Clinical Narrative:     CM follow up call placed to Tammy, Admissions, [facility] regarding closed liability claim. Per Benn Brash, SNF can accept patient pending auth approval.   CM alert to CMA Pulliam, regarding assistance with initiating auth for SNF placement.   Alert received from CMA Pulliam,  Pending PlanAuthID: [id number].  CM will continue to follow for auth approval.   Expected Discharge Plan and Services    SNF  Social Determinants of Health (SDOH) Interventions SDOH Screenings   Food Insecurity: Food Insecurity Present (02/21/2024)  Housing: High Risk (02/21/2024)  Transportation Needs: Unmet Transportation Needs (02/21/2024)  Utilities: Not At Risk (02/21/2024)  Financial Resource Strain: Low Risk  (11/30/2023)   Received from [facility]  Social Connections: Moderately Integrated (02/21/2024)  Tobacco Use: High Risk (02/21/2024)    Readmission Risk Interventions     No data to display

## 2024-02-29 NOTE — Plan of Care (Signed)
  Problem: Activity: Goal: Risk for activity intolerance will decrease Outcome: Progressing   Problem: Elimination: Goal: Will not experience complications related to bowel motility Outcome: Progressing   Problem: Pain Managment: Goal: General experience of comfort will improve and/or be controlled Outcome: Progressing   Problem: Safety: Goal: Ability to remain free from injury will improve Outcome: Progressing

## 2024-02-29 NOTE — Progress Notes (Signed)
 Physical Therapy Treatment Patient Details Name: Kristine Rangel MRN: [id number] DOB: [birth year] Today's Date: 02/29/2024   History of Present Illness Pt is 68 yo female s/p R THA on 02/21/24. PMH of asthma, COPD, former smoker, past MI, HTN, anxiety. depression, CVA, s/p brain surgery (anuerysm repair), DMII, renal disease, CHF, rotator cuff surgery, glaucoma, R TKA revision, R TSA 08/02/23.    PT Comments  Pt was long sitting in recliner upon arrival. she is A and O x 4 and continues to be very motivated and cooperative. Agreeable to session and remains cooperative throughout. Session focused on improving gait quality and tolerance. Pt puts forth great effort but is still unable to maintain step through pattern. Encouraged to take small RLE step with further LLE advancement. Is able to perform but unable to maintain. Pt did go further distance today but profusely sweating afterwards. HR and BP remain stable. With seated rest, pt endorses feeling good. Acute PT will continue to follow and progress per current POC. Dc recs remain appropriate to maximize independence and safety with all ADLs.    If plan is discharge home, recommend the following: Assist for transportation;Help with stairs or ramp for entrance;A little help with walking and/or transfers;A little help with bathing/dressing/bathroom     Equipment Recommendations  Other (comment) (Defer to next level of care)       Precautions / Restrictions Precautions Precautions: Fall;Posterior Hip Precaution Booklet Issued: Yes (comment) Recall of Precautions/Restrictions: Intact Restrictions Weight Bearing Restrictions Per Provider Order: Yes RLE Weight Bearing Per Provider Order: Weight bearing as tolerated     Mobility  Bed Mobility  General bed mobility comments: In recliner pre/post session    Transfers Overall transfer level: Needs assistance Equipment used: Rolling walker (2 wheels) Transfers: Sit to/from Stand Sit to Stand:  Contact guard assist  General transfer comment: Vcs for handplacement and technique improvements    Ambulation/Gait Ambulation/Gait assistance: Contact guard assist, Supervision Gait Distance (Feet): 70 Feet Assistive device: Rolling walker (2 wheels) Gait Pattern/deviations: Antalgic, Step-to pattern, Trunk flexed, Step-through pattern Gait velocity: decreased  General Gait Details: Gait training focused on improving step to pattern to step through pattern. Was able to perform but unable to maintain.    Balance Overall balance assessment: Needs assistance Sitting-balance support: Feet supported, No upper extremity supported Sitting balance-Leahy Scale: Good     Standing balance support: Bilateral upper extremity supported Standing balance-Leahy Scale: Good    Communication Communication Communication: No apparent difficulties  Cognition Arousal: Alert Behavior During Therapy: WFL for tasks assessed/performed   PT - Cognitive impairments: No apparent impairments    PT - Cognition Comments: very pleasant, motivated to participate Following commands: Intact      Cueing Cueing Techniques: Verbal cues, Tactile cues         Pertinent Vitals/Pain Pain Assessment Pain Assessment: 0-10 Pain Score: 6  Pain Location: R hip Pain Descriptors / Indicators: Discomfort, Aching Pain Intervention(s): Limited activity within patient's tolerance, Monitored during session, Premedicated before session, Repositioned     PT Goals (current goals can now be found in the care plan section) Acute Rehab PT Goals Patient Stated Goal: rehab then home to my new apartment Progress towards PT goals: Progressing toward goals    Frequency    BID       AM-PAC PT "6 Clicks" Mobility   Outcome Measure  Help needed turning from your back to your side while in a flat bed without using bedrails?: A Little Help needed moving from lying  on your back to sitting on the side of a flat bed without  using bedrails?: A Little Help needed moving to and from a bed to a chair (including a wheelchair)?: A Little Help needed standing up from a chair using your arms (e.g., wheelchair or bedside chair)?: A Little Help needed to walk in hospital room?: A Little Help needed climbing 3-5 steps with a railing? : A Lot 6 Click Score: 17    End of Session Equipment Utilized During Treatment: Gait belt Activity Tolerance: Patient tolerated treatment well Patient left: in chair;with chair alarm set;with call bell/phone within reach (OT in room post PT session) Nurse Communication: Mobility status PT Visit Diagnosis: Unsteadiness on feet (R26.81);Other abnormalities of gait and mobility (R26.89);Muscle weakness (generalized) (M62.81);Difficulty in walking, not elsewhere classified (R26.2);Pain Pain - Right/Left: Right Pain - part of body: Hip     Time: 1001-1021 PT Time Calculation (min) (ACUTE ONLY): 20 min  Charges:    $Gait Training: 8-22 mins PT General Charges $$ ACUTE PT VISIT: 1 Visit                     Chester Costa PTA 02/29/24, 11:31 AM

## 2024-02-29 NOTE — Plan of Care (Signed)

## 2024-02-29 NOTE — Progress Notes (Signed)
 Physical Therapy Treatment Patient Details Name: Kristine Rangel MRN: [id number] DOB: Jun 23, 1956 Today's Date: 02/29/2024   History of Present Illness Pt is 68 yo female s/p R THA on 02/21/24. PMH of asthma, COPD, former smoker, past MI, HTN, anxiety. depression, CVA, s/p brain surgery (anuerysm repair), DMII, renal disease, CHF, rotator cuff surgery, glaucoma, R TKA revision, R TSA 08/02/23.    PT Comments  Pt was long sitting in recliner upon arrival. she is A and O x 4 and continues to be very motivated and cooperative. Agreeable to session and remains cooperative throughout. Session focused on improving gait quality and tolerance. Pt puts forth great effort but is still unable to maintain step through pattern. Encouraged to take small RLE step with further LLE advancement. Is able to perform but unable to maintain. Pt did go further distance today but profusely sweating afterwards. HR and BP remain stable. With seated rest, pt endorses feeling good. Acute PT will continue to follow and progress per current POC. Dc recs remain appropriate to maximize independence and safety with all ADLs.    If plan is discharge home, recommend the following: Assist for transportation;Help with stairs or ramp for entrance;A little help with walking and/or transfers;A little help with bathing/dressing/bathroom     Equipment Recommendations  Other (comment) (Defer to next level of care)       Precautions / Restrictions Precautions Precautions: Fall;Posterior Hip Precaution Booklet Issued: Yes (comment) Recall of Precautions/Restrictions: Intact Restrictions Weight Bearing Restrictions Per Provider Order: Yes RLE Weight Bearing Per Provider Order: Weight bearing as tolerated     Mobility  Bed Mobility  General bed mobility comments: In recliner pre/post session    Transfers Overall transfer level: Needs assistance Equipment used: Rolling walker (2 wheels) Transfers: Sit to/from Stand Sit to Stand:  Contact guard assist  General transfer comment: Vcs for handplacement and technique improvements    Ambulation/Gait Ambulation/Gait assistance: Contact guard assist, Supervision Gait Distance (Feet): 70 Feet Assistive device: Rolling walker (2 wheels) Gait Pattern/deviations: Antalgic, Step-to pattern, Trunk flexed, Step-through pattern Gait velocity: decreased  General Gait Details: Gait training focused on improving step to pattern to step through pattern. Was able to perform but unable to maintain.    Balance Overall balance assessment: Needs assistance Sitting-balance support: Feet supported, No upper extremity supported Sitting balance-Leahy Scale: Good     Standing balance support: Bilateral upper extremity supported Standing balance-Leahy Scale: Good    Communication Communication Communication: No apparent difficulties  Cognition Arousal: Alert Behavior During Therapy: WFL for tasks assessed/performed   PT - Cognitive impairments: No apparent impairments    PT - Cognition Comments: very pleasant, motivated to participate Following commands: Intact      Cueing Cueing Techniques: Verbal cues, Tactile cues         Pertinent Vitals/Pain Pain Assessment Pain Assessment: 0-10 Pain Score: 6  Pain Location: R hip Pain Descriptors / Indicators: Discomfort, Aching Pain Intervention(s): Limited activity within patient's tolerance, Monitored during session, Premedicated before session, Repositioned     PT Goals (current goals can now be found in the care plan section) Acute Rehab PT Goals Patient Stated Goal: rehab then home to my new apartment Progress towards PT goals: Progressing toward goals    Frequency    BID       AM-PAC PT 6 Clicks Mobility   Outcome Measure  Help needed turning from your back to your side while in a flat bed without using bedrails?: A Little Help needed moving from lying  on your back to sitting on the side of a flat bed without  using bedrails?: A Little Help needed moving to and from a bed to a chair (including a wheelchair)?: A Little Help needed standing up from a chair using your arms (e.g., wheelchair or bedside chair)?: A Little Help needed to walk in hospital room?: A Little Help needed climbing 3-5 steps with a railing? : A Lot 6 Click Score: 17    End of Session Equipment Utilized During Treatment: Gait belt Activity Tolerance: Patient tolerated treatment well Patient left: in chair;with chair alarm set;with call bell/phone within reach (OT in room post PT session) Nurse Communication: Mobility status PT Visit Diagnosis: Unsteadiness on feet (R26.81);Other abnormalities of gait and mobility (R26.89);Muscle weakness (generalized) (M62.81);Difficulty in walking, not elsewhere classified (R26.2);Pain Pain - Right/Left: Right Pain - part of body: Hip     Time: 1001-1021 PT Time Calculation (min) (ACUTE ONLY): 20 min  Charges:    $Gait Training: 8-22 mins PT General Charges $$ ACUTE PT VISIT: 1 Visit                     Rankin Essex PTA 02/29/24, 11:31 AM

## 2024-02-29 NOTE — Progress Notes (Signed)
 Subjective: 8 Days Post-Op Procedure(s) (LRB): ARTHROPLASTY, HIP, TOTAL,POSTERIOR APPROACH (Right) Patient reports pain as mild.  Pain improving Patient is well, and has had no acute complaints or problems Plan is to go Skilled nursing facility after hospital stay. Negative for chest pain and shortness of breath Fever: no Gastrointestinal:Negative for nausea and vomiting She has had a BM.   Objective: Vital signs in last 24 hours: Temp:  [98 F (36.7 C)-98.6 F (37 C)] 98.3 F (36.8 C) (04/18 0740) Pulse Rate:  [72-90] 80 (04/18 0740) Resp:  [16-17] 17 (04/18 0740) BP: (89-123)/(44-59) 102/59 (04/18 0740) SpO2:  [95 %-100 %] 99 % (04/18 0740)  Intake/Output from previous day:  Intake/Output Summary (Last 24 hours) at 02/29/2024 1333 Last data filed at 02/29/2024 1031 Gross per 24 hour  Intake 480 ml  Output --  Net 480 ml    Intake/Output this shift: Total I/O In: 240 [P.O.:240] Out: -   Labs: No results for input(s): "HGB" in the last 72 hours.  No results for input(s): "WBC", "RBC", "HCT", "PLT" in the last 72 hours.  No results for input(s): "NA", "K", "CL", "CO2", "BUN", "CREATININE", "GLUCOSE", "CALCIUM " in the last 72 hours.  No results for input(s): "LABPT", "INR" in the last 72 hours.   EXAM General - Patient is Alert, Appropriate, and Oriented Extremity - Neurovascular intact Dorsiflexion/Plantar flexion intact Incision: dressing C/D/I and no drainage No cellulitis present Compartment soft Dressing/Incision - Honeycomb intact to the right hip. Motor Function - intact, moving foot and toes well on exam.    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   Bladder leak    Cardiac murmur    Chest pain    a.) 10/07/2008 Neg Dobuatmine Echo; b.) LHC 09/11/2011 - minor lum irregs; no sig CAD; c.) MV 06/02/2015: no ischemia; d.) cCTA 02/2021: Ca2+ = 0. Normal coronaries   CKD (chronic kidney disease), stage III (HCC)    COPD (chronic obstructive pulmonary disease) (HCC)    Depression    Diet-controlled type 2 diabetes mellitus (HCC)    Dyspnea    Essential hypertension    GERD (gastroesophageal reflux disease)    Glaucoma    History of kidney stones    History of left heart catheterization (LHC) 09/11/2011   a.) LHC 09/11/2011: minor luminal irregulaties mLAD, mLCx, mRCA - med mgmt.   Hyperlipidemia    Insomnia    a.) takes suvorexant    Long term current use of aspirin     MI (myocardial infarction) (HCC)    a.) in the 1990s; details unclear   Migraine with aura    Neuropathy    OSA on CPAP    Rotator cuff injury    Seasonal allergies    Tendinitis of right rotator cuff    TIA (transient ischemic attack)     Assessment/Plan: 8 Days Post-Op Procedure(s) (LRB): ARTHROPLASTY, HIP, TOTAL,POSTERIOR APPROACH (Right) Principal Problem:   Status post total hip replacement, right  Estimated body mass index is 47.47  kg/m as calculated from the following:   Height as of 02/19/24: 5\' 3"  (1.6 m).   Weight as of 02/19/24: 121.6 kg. Advance diet Up with therapy  Vital signs are stable Pain well-controlled She has had a BM at this time. Care management to assist with discharge to skilled nursing facility, still pending Plan for discharge once insurance approval in obtained and bed available. Patient stable for discharge to SNF.  DVT Prophylaxis -  Eliquis  and SCDs Weight-Bearing as tolerated to right leg Will plan to follow up with [facility] orthopedics in 2  weeks  J. Edie Goon, PA-C [facility] Orthopaedic Surgery 02/29/2024, 1:33 PM

## 2024-02-29 NOTE — Plan of Care (Signed)
  Problem: Education: Goal: Knowledge of General Education information will improve Description: Including pain rating scale, medication(s)/side effects and non-pharmacologic comfort measures Outcome: Progressing   Problem: Clinical Measurements: Goal: Ability to maintain clinical measurements within normal limits will improve Outcome: Progressing   Problem: Activity: Goal: Risk for activity intolerance will decrease Outcome: Progressing   Problem: Nutrition: Goal: Adequate nutrition will be maintained Outcome: Progressing   Problem: Elimination: Goal: Will not experience complications related to bowel motility Outcome: Progressing Goal: Will not experience complications related to urinary retention Outcome: Progressing   Problem: Pain Managment: Goal: General experience of comfort will improve and/or be controlled Outcome: Progressing   Problem: Safety: Goal: Ability to remain free from injury will improve Outcome: Progressing   Problem: Skin Integrity: Goal: Risk for impaired skin integrity will decrease Outcome: Progressing   Problem: Safety: Goal: Ability to remain free from injury will improve Outcome: Progressing   Problem: Skin Integrity: Goal: Risk for impaired skin integrity will decrease Outcome: Progressing   Problem: Activity: Goal: Ability to avoid complications of mobility impairment will improve Outcome: Progressing   Problem: Pain Management: Goal: Pain level will decrease with appropriate interventions Outcome: Progressing

## 2024-03-01 DIAGNOSIS — M1611 Unilateral primary osteoarthritis, right hip: Secondary | ICD-10-CM | POA: Diagnosis not present

## 2024-03-01 LAB — CREATININE, SERUM
Creatinine, Ser: 1.16 mg/dL — ABNORMAL HIGH (ref 0.44–1.00)
GFR, Estimated: 51 mL/min — ABNORMAL LOW (ref >60)

## 2024-03-01 LAB — CBC
HCT: 33.9 % — ABNORMAL LOW (ref 36.0–46.0)
Hemoglobin: 11.5 g/dL — ABNORMAL LOW (ref 12.0–15.0)
MCH: 27.4 pg (ref 26.0–34.0)
MCHC: 33.9 g/dL (ref 30.0–36.0)
MCV: 80.9 fL (ref 80.0–100.0)
Platelets: 481 10*3/uL — ABNORMAL HIGH (ref 150–400)
RBC: 4.19 MIL/uL (ref 3.87–5.11)
RDW: 15.2 % (ref 11.5–15.5)
WBC: 8.3 10*3/uL (ref 4.0–10.5)
nRBC: 0 % (ref 0.0–0.2)

## 2024-03-01 NOTE — Plan of Care (Signed)
  Problem: Education: Goal: Knowledge of General Education information will improve Description: Including pain rating scale, medication(s)/side effects and non-pharmacologic comfort measures Outcome: Progressing   Problem: Clinical Measurements: Goal: Ability to maintain clinical measurements within normal limits will improve Outcome: Progressing   Problem: Elimination: Goal: Will not experience complications related to bowel motility Outcome: Progressing Goal: Will not experience complications related to urinary retention Outcome: Progressing   Problem: Pain Managment: Goal: General experience of comfort will improve and/or be controlled Outcome: Progressing   Problem: Activity: Goal: Ability to avoid complications of mobility impairment will improve Outcome: Progressing   Problem: Skin Integrity: Goal: Will show signs of wound healing Outcome: Progressing

## 2024-03-01 NOTE — Plan of Care (Signed)
 Patient educated on the importance of increasing her activity tolerance and watching her nutritional intake. Patient acknowledged that she needs to keep moving upon returning home to prevent skin break down and DTI. Patient reports that she will have people around her to encourage and keep her motivated. Problem: Education: Goal: Knowledge of General Education information will improve Description: Including pain rating scale, medication(s)/side effects and non-pharmacologic comfort measures 03/01/2024 1846 by Cleave Curling, RN Outcome: Progressing 03/01/2024 1845 by Cleave Curling, RN Outcome: Progressing   Problem: Health Behavior/Discharge Planning: Goal: Ability to manage health-related needs will improve Outcome: Progressing   Problem: Nutrition: Goal: Adequate nutrition will be maintained 03/01/2024 1846 by Cleave Curling, RN Outcome: Progressing 03/01/2024 1845 by Cleave Curling, RN Outcome: Progressing   Problem: Coping: Goal: Level of anxiety will decrease Outcome: Progressing   Problem: Safety: Goal: Ability to remain free from injury will improve Outcome: Progressing   Problem: Skin Integrity: Goal: Risk for impaired skin integrity will decrease 03/01/2024 1846 by Cleave Curling, RN Outcome: Progressing 03/01/2024 1845 by Cleave Curling, RN Outcome: Progressing

## 2024-03-01 NOTE — Progress Notes (Signed)
 PT Cancellation Note  Patient Details Name: BRADLEY HANDYSIDE MRN: [id number] DOB: 12/09/1955   Cancelled Treatment:     PT attempt. Pt sleeping soundly upon arrival. She does awake but requested not to have PM session. Pt did discuss possible DC home versus rehab but endorses private hired caregivers are unable to resume care until ~ 1 week. Plan remains for DC to STR/SNF. Acute PT will return tomorrow and continue to follow per current POC.    Koleen Perna 03/01/2024, 3:12 PM

## 2024-03-01 NOTE — Progress Notes (Signed)
 Subjective: 9 Days Post-Op Procedure(s) (LRB): ARTHROPLASTY, HIP, TOTAL,POSTERIOR APPROACH (Right) Patient reports pain as mild.  Pain improving Patient is well, and has had no acute complaints or problems Plan is to go Skilled nursing facility after hospital stay. Negative for chest pain and shortness of breath Fever: no Gastrointestinal:Negative for nausea and vomiting She has had a BM.   Objective: Vital signs in last 24 hours: Temp:  [97.6 F (36.4 C)-98.2 F (36.8 C)] 98.2 F (36.8 C) (04/19 0918) Pulse Rate:  [77-96] 96 (04/19 0918) Resp:  [16-18] 18 (04/19 0918) BP: (108-117)/(47-61) 108/61 (04/19 0918) SpO2:  [98 %-100 %] 98 % (04/19 0918) Weight:  [213 kg] 125 kg (04/19 0122)  Intake/Output from previous day:  Intake/Output Summary (Last 24 hours) at 03/01/2024 1104 Last data filed at 02/29/2024 2100 Gross per 24 hour  Intake 720 ml  Output --  Net 720 ml    Intake/Output this shift: No intake/output data recorded.  Labs: Recent Labs    03/01/24 0424  HGB 11.5*    Recent Labs    03/01/24 0424  WBC 8.3  RBC 4.19  HCT 33.9*  PLT 481*    Recent Labs    03/01/24 0424  CREATININE 1.16*    No results for input(s): "LABPT", "INR" in the last 72 hours.   EXAM General - Patient is Alert, Appropriate, and Oriented Extremity - Neurovascular intact Dorsiflexion/Plantar flexion intact Incision: dressing C/D/I and no drainage No cellulitis present Compartment soft Dressing/Incision - Honeycomb intact to the right hip. Motor Function - intact, moving foot and toes well on exam.    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   Bladder leak    Cardiac murmur    Chest pain    a.) 10/07/2008 Neg Dobuatmine Echo; b.) LHC 09/11/2011 - minor lum irregs; no sig CAD; c.) MV 06/02/2015: no ischemia; d.) cCTA 02/2021: Ca2+ = 0. Normal coronaries   CKD (chronic kidney disease), stage III (HCC)    COPD (chronic obstructive pulmonary disease) (HCC)    Depression    Diet-controlled type 2 diabetes mellitus (HCC)    Dyspnea    Essential hypertension    GERD (gastroesophageal reflux disease)    Glaucoma    History of kidney stones    History of left heart catheterization (LHC) 09/11/2011   a.) LHC 09/11/2011: minor luminal irregulaties mLAD, mLCx, mRCA - med mgmt.   Hyperlipidemia    Insomnia    a.) takes suvorexant    Long term current use of aspirin     MI (myocardial infarction) (HCC)    a.) in the 1990s; details unclear   Migraine with aura    Neuropathy    OSA on CPAP    Rotator cuff injury    Seasonal allergies    Tendinitis of right rotator cuff    TIA (transient ischemic attack)     Assessment/Plan: 9 Days Post-Op Procedure(s) (LRB): ARTHROPLASTY, HIP, TOTAL,POSTERIOR APPROACH (Right) Principal Problem:   Status post total hip  replacement, right  Estimated body mass index is 48.82 kg/m as calculated from the following:   Height as of this encounter: 5\' 3"  (1.6 m).   Weight as of this encounter: 125 kg. Advance diet Up with therapy  Vital signs are stable Pain well-controlled She has had a BM at this time. Care management to assist with discharge to skilled nursing facility, found placement at [facility] Discussed with PT, patient may do well with going home at this point. Patient states she has home care that she will call to see if she is okay to go home vs SNF. Patient stable for discharge to SNF, will look for potential safe home DC  DVT  Prophylaxis -  Eliquis  and SCDs Weight-Bearing as tolerated to right leg Will plan to follow up with [facility] orthopedics 2 weeks postoperatively  Standley Earing, PA-C [facility] Orthopaedic Surgery 03/01/2024, 11:04 AM

## 2024-03-01 NOTE — Progress Notes (Signed)
 Physical Therapy Treatment Patient Details Name: Kristine Rangel MRN: [id number] DOB: February 16, 1956 Today's Date: 03/01/2024   History of Present Illness Pt is 68 yo female s/p R THA on 02/21/24. PMH of asthma, COPD, former smoker, past MI, HTN, anxiety. depression, CVA, s/p brain surgery (anuerysm repair), DMII, renal disease, CHF, rotator cuff surgery, glaucoma, R TKA revision, R TSA 08/02/23.    PT Comments  Pt was sitting in recliner upon arrival. RN in room just finishing giving medications. Pt remains motivated and pleasant. Discussed her progress over past week and ask if personal caregivers are able to return to assisting her immediately at DC. She said she will call them later this date. Pt was able to stand to RW and ambulate ~ 70 ft total. Distance limited by fatigue. Great effort from pt. Profusely sweating but has been throughout admission and states its like that at baseline. Once returned to room, reviewed HEP and pt continues to perform. Discussed with ortho PA pts progress and possibility of Dcing straight home from acute hospital. Will confirm this afternoon if private caregivers are able to resume at DC.   If plan is discharge home, recommend the following: Assist for transportation;Help with stairs or ramp for entrance;A little help with walking and/or transfers;A little help with bathing/dressing/bathroom     Equipment Recommendations  Rolling walker (2 wheels);BSC/3in1 (baritric due to body habitus)       Precautions / Restrictions Precautions Precautions: Fall;Posterior Hip Precaution Booklet Issued: Yes (comment) Recall of Precautions/Restrictions: Intact Restrictions Weight Bearing Restrictions Per Provider Order: Yes RLE Weight Bearing Per Provider Order: Weight bearing as tolerated     Mobility  Bed Mobility  General bed mobility comments: In recliner pre/post session    Transfers Overall transfer level: Needs assistance Equipment used: Rolling walker (2  wheels) Transfers: Sit to/from Stand Sit to Stand: Supervision  General transfer comment: no physical asistance required to stnad or sit to/from recliner to RW    Ambulation/Gait Ambulation/Gait assistance: Supervision Gait Distance (Feet): 70 Feet Assistive device: Rolling walker (2 wheels) Gait Pattern/deviations: Antalgic, Trunk flexed, Step-through pattern Gait velocity: decreased  General Gait Details: Pt was able to ambulate ~ 70 ft total without physical assistance. Pt does nto ambulate this far at baseline.    Balance Overall balance assessment: Needs assistance Sitting-balance support: Feet supported, No upper extremity supported Sitting balance-Leahy Scale: Good     Standing balance support: Bilateral upper extremity supported Standing balance-Leahy Scale: Good    Communication Communication Communication: No apparent difficulties  Cognition Arousal: Alert Behavior During Therapy: WFL for tasks assessed/performed   PT - Cognitive impairments: No apparent impairments   PT - Cognition Comments: very pleasant, motivated to participate Following commands: Intact      Cueing Cueing Techniques: Verbal cues, Tactile cues     General Comments General comments (skin integrity, edema, etc.): pt continues to endorse that she has been performing her HEP throughout the day.      Pertinent Vitals/Pain Pain Assessment Pain Assessment: 0-10 Pain Score: 4  Pain Intervention(s): Limited activity within patient's tolerance, Monitored during session, Premedicated before session, Repositioned     PT Goals (current goals can now be found in the care plan section) Acute Rehab PT Goals Patient Stated Goal: rehab then home to my new apartment Progress towards PT goals: Progressing toward goals    Frequency    BID       AM-PAC PT "6 Clicks" Mobility   Outcome Measure  Help needed turning from your  back to your side while in a flat bed without using bedrails?: A  Little Help needed moving from lying on your back to sitting on the side of a flat bed without using bedrails?: A Little Help needed moving to and from a bed to a chair (including a wheelchair)?: A Little Help needed standing up from a chair using your arms (e.g., wheelchair or bedside chair)?: A Little Help needed to walk in hospital room?: A Little Help needed climbing 3-5 steps with a railing? : A Lot 6 Click Score: 17    End of Session Equipment Utilized During Treatment: Gait belt Activity Tolerance: Patient tolerated treatment well Patient left: in bed;with call bell/phone within reach;with bed alarm set Nurse Communication: Mobility status PT Visit Diagnosis: Unsteadiness on feet (R26.81);Other abnormalities of gait and mobility (R26.89);Muscle weakness (generalized) (M62.81);Difficulty in walking, not elsewhere classified (R26.2);Pain Pain - Right/Left: Right Pain - part of body: Hip     Time: 5409-8119 PT Time Calculation (min) (ACUTE ONLY): 21 min  Charges:    $Gait Training: 8-22 mins PT General Charges $$ ACUTE PT VISIT: 1 Visit                    Chester Costa PTA 03/01/24, 1:16 PM

## 2024-03-02 ENCOUNTER — Observation Stay

## 2024-03-02 DIAGNOSIS — M1611 Unilateral primary osteoarthritis, right hip: Secondary | ICD-10-CM | POA: Diagnosis not present

## 2024-03-02 NOTE — Plan of Care (Signed)
  Problem: Education: Goal: Knowledge of General Education information will improve Description: Including pain rating scale, medication(s)/side effects and non-pharmacologic comfort measures Outcome: Progressing   Problem: Clinical Measurements: Goal: Ability to maintain clinical measurements within normal limits will improve Outcome: Progressing   Problem: Activity: Goal: Risk for activity intolerance will decrease Outcome: Progressing   Problem: Elimination: Goal: Will not experience complications related to bowel motility Outcome: Progressing Goal: Will not experience complications related to urinary retention Outcome: Progressing   Problem: Pain Managment: Goal: General experience of comfort will improve and/or be controlled Outcome: Progressing   Problem: Safety: Goal: Ability to remain free from injury will improve Outcome: Progressing   Problem: Pain Management: Goal: Pain level will decrease with appropriate interventions Outcome: Progressing   Problem: Skin Integrity: Goal: Will show signs of wound healing Outcome: Progressing

## 2024-03-02 NOTE — Progress Notes (Signed)
 This patient is not able to walk the distance required to go the bathroom, or he/she is unable to safely negotiate stairs required to access the bathroom.  A bariatric 3-in-1 BSC will alleviate this problem as she has a BMI of greater than 45.

## 2024-03-02 NOTE — Progress Notes (Signed)
 Physical Therapy Treatment Patient Details Name: Kristine Rangel MRN: [id number] DOB: Dec 27, 1955 Today's Date: 03/02/2024   History of Present Illness Pt is 68 yo female s/p R THA on 02/21/24. PMH of asthma, COPD, former smoker, past MI, HTN, anxiety. depression, CVA, s/p brain surgery (anuerysm repair), DMII, renal disease, CHF, rotator cuff surgery, glaucoma, R TKA revision, R TSA 08/02/23.    PT Comments  R/O DVT (-) this am per chart..  She is able to get to EOB with rails, stand and walk 70' in hallway with RW and supervision/mod I.  Does report some increase soreness from test this am but opts to remain in recliner after session.  Pt stated she has decided to go home vs SNF tomorrow.  Stated she will stay with family.  She has no further questions or concerns.  She does ask for RW and BSC and will benefit from bariatric size given body habitus.  Will relay to TOC. Stated family will be here 9:30/10:00 tomorrow.   If plan is discharge home, recommend the following: Assist for transportation;Help with stairs or ramp for entrance;A little help with walking and/or transfers;A little help with bathing/dressing/bathroom   Can travel by private vehicle        Equipment Recommendations  Rolling walker (2 wheels);BSC/3in1 (bariatric)    Recommendations for Other Services       Precautions / Restrictions Precautions Precautions: Fall;Posterior Hip Precaution Booklet Issued: Yes (comment) Recall of Precautions/Restrictions: Intact Restrictions Weight Bearing Restrictions Per Provider Order: Yes RLE Weight Bearing Per Provider Order: Weight bearing as tolerated     Mobility  Bed Mobility   Bed Mobility: Supine to Sit     Supine to sit: HOB elevated, Used rails, Modified independent (Device/Increase time)       Patient Response: Cooperative  Transfers Overall transfer level: Needs assistance Equipment used: Rolling walker (2 wheels) Transfers: Sit to/from Stand Sit to Stand:  Modified independent (Device/Increase time), Supervision                Ambulation/Gait Ambulation/Gait assistance: Modified independent (Device/Increase time) Gait Distance (Feet): 70 Feet Assistive device: Rolling walker (2 wheels) Gait Pattern/deviations: Antalgic, Trunk flexed, Step-through pattern Gait velocity: decreased     General Gait Details: no assist needed today   Stairs             Wheelchair Mobility     Tilt Bed Tilt Bed Patient Response: Cooperative  Modified Rankin (Stroke Patients Only)       Balance Overall balance assessment: Modified Independent, Mild deficits observed, not formally tested Sitting-balance support: Feet supported, No upper extremity supported Sitting balance-Leahy Scale: Good     Standing balance support: Bilateral upper extremity supported Standing balance-Leahy Scale: Good                              Communication Communication Communication: No apparent difficulties  Cognition Arousal: Alert Behavior During Therapy: WFL for tasks assessed/performed   PT - Cognitive impairments: No apparent impairments                         Following commands: Intact      Cueing Cueing Techniques: Verbal cues, Tactile cues  Exercises      General Comments        Pertinent Vitals/Pain Pain Assessment Pain Assessment: Faces Faces Pain Scale: Hurts little more Pain Location: R leg from hip and test this AM  Pain Descriptors / Indicators: Discomfort, Aching Pain Intervention(s): Monitored during session, Repositioned    Home Living                          Prior Function            PT Goals (current goals can now be found in the care plan section) Progress towards PT goals: Progressing toward goals    Frequency    BID      PT Plan      Co-evaluation              AM-PAC PT "6 Clicks" Mobility   Outcome Measure  Help needed turning from your back to your side  while in a flat bed without using bedrails?: A Little Help needed moving from lying on your back to sitting on the side of a flat bed without using bedrails?: A Little Help needed moving to and from a bed to a chair (including a wheelchair)?: None Help needed standing up from a chair using your arms (e.g., wheelchair or bedside chair)?: None Help needed to walk in hospital room?: None Help needed climbing 3-5 steps with a railing? : A Little 6 Click Score: 21    End of Session   Activity Tolerance: Patient tolerated treatment well Patient left: in chair;with chair alarm set;with call bell/phone within reach Nurse Communication: Mobility status PT Visit Diagnosis: Unsteadiness on feet (R26.81);Other abnormalities of gait and mobility (R26.89);Muscle weakness (generalized) (M62.81);Difficulty in walking, not elsewhere classified (R26.2);Pain Pain - Right/Left: Right Pain - part of body: Hip     Time: 5409-8119 PT Time Calculation (min) (ACUTE ONLY): 12 min  Charges:    $Gait Training: 8-22 mins PT General Charges $$ ACUTE PT VISIT: 1 Visit                   Charlanne Cong, PTA 03/02/24, 1:39 PM

## 2024-03-02 NOTE — TOC Progression Note (Signed)
 Transition of Care [facility]) - Progression Note    Patient Details  Name: Kristine Rangel MRN: [id number] Date of Birth: 10/07/1956  [facility] of [facility]) CM/SW Contact  Areta Beer, RN Phone Number: 03/02/2024, 4:37 PM  Clinical Narrative:  4/20: Contacted [employer] via voice mail at 445 pm for DME bariatric BSC and RW, provider had placed order progress note. Home health orders for PT/RN/HH aide. Pending approval for home health via [facility]. Notified Katina at [facility] of orders via secure message.    Katheryn Pandy MSN RN CM  RN Case Manager [place]  Transitions of Care Direct Dial: [phone] (Weekends Only) [facility] Main Office Phone: [phone] [facility] Fax: [phone] [email or web address]          Expected Discharge Plan and Services                                               Social Determinants of Health (SDOH) Interventions SDOH Screenings   Food Insecurity: Food Insecurity Present (02/21/2024)  Housing: High Risk (02/21/2024)  Transportation Needs: Unmet Transportation Needs (02/21/2024)  Utilities: Not At Risk (02/21/2024)  Financial Resource Strain: Low Risk  (11/30/2023)   Received from [facility]  Social Connections: Moderately Integrated (02/21/2024)  Tobacco Use: High Risk (02/21/2024)    Readmission Risk Interventions     No data to display

## 2024-03-02 NOTE — TOC Progression Note (Addendum)
 Transition of Care [facility]) - Progression Note    Patient Details  Name: Kristine Rangel MRN: [id number] Date of Birth: 1956-03-01  Transition [facility] Care [facility]) CM/SW Contact  Areta Beer, RN Phone Number: 03/02/2024, 1:47 PM  Clinical Narrative: 4/20: Per PT Patient/family decided to go home vs SNF now. Family to transport at 0930 am. Patient will need a Bariatric BSC and RW. Added provider to secure chat for DME orders. Patient was last active with [facility] for home health in March 2025. Contacted Katina at [place] for resumption of care pending home health orders. DME orders pending.     Katheryn Pandy MSN RN CM  RN Case Manager [place]  Transitions of Care Direct Dial: [phone] (Weekends Only) [facility] Main Office Phone: [phone] [facility] Fax: [phone] [email or web address]          Expected Discharge Plan and Services                                               Social Determinants of Health (SDOH) Interventions SDOH Screenings   Food Insecurity: Food Insecurity Present (02/21/2024)  Housing: High Risk (02/21/2024)  Transportation Needs: Unmet Transportation Needs (02/21/2024)  Utilities: Not At Risk (02/21/2024)  Financial Resource Strain: Low Risk  (11/30/2023)   Received from [facility]  Social Connections: Moderately Integrated (02/21/2024)  Tobacco Use: High Risk (02/21/2024)    Readmission Risk Interventions     No data to display

## 2024-03-02 NOTE — Progress Notes (Signed)
 Subjective: 10 Days Post-Op Procedure(s) (LRB): ARTHROPLASTY, HIP, TOTAL,POSTERIOR APPROACH (Right) Patient reports pain as mild.  Pain improving Patient is well, and has had no acute complaints or problems After discussion with the patient and PT, found it best for the patient to go home. Plan is to go home after hospital stay. Negative for chest pain and shortness of breath Fever: no Gastrointestinal:Negative for nausea and vomiting She has had a BM.   Objective: Vital signs in last 24 hours: Temp:  [97.2 F (36.2 C)-98.3 F (36.8 C)] 97.9 F (36.6 C) (04/20 0739) Pulse Rate:  [79-96] 79 (04/20 0739) Resp:  [14-18] 18 (04/20 0739) BP: (97-125)/(56-76) 115/56 (04/20 0739) SpO2:  [98 %-99 %] 98 % (04/20 0739)  Intake/Output from previous day: No intake or output data in the 24 hours ending 03/02/24 0821   Intake/Output this shift: No intake/output data recorded.  Labs: Recent Labs    03/01/24 0424  HGB 11.5*    Recent Labs    03/01/24 0424  WBC 8.3  RBC 4.19  HCT 33.9*  PLT 481*    Recent Labs    03/01/24 0424  CREATININE 1.16*    No results for input(s): "LABPT", "INR" in the last 72 hours.   EXAM General - Patient is Alert, Appropriate, and Oriented Extremity - Neurovascular intact Dorsiflexion/Plantar flexion intact Incision: dressing C/D/I and no drainage No cellulitis present Compartment soft Patient does report pain extending down into her calf, Homan's test +. Dressing/Incision - Honeycomb intact to the right hip. Motor Function - intact, moving foot and toes well on exam.    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   Bladder leak    Cardiac murmur    Chest pain    a.) 10/07/2008 Neg Dobuatmine Echo; b.) LHC 09/11/2011 - minor lum irregs; no sig CAD; c.) MV 06/02/2015: no ischemia; d.) cCTA 02/2021: Ca2+ = 0. Normal coronaries   CKD (chronic kidney disease), stage III (HCC)    COPD (chronic obstructive pulmonary disease) (HCC)    Depression    Diet-controlled type 2 diabetes mellitus (HCC)    Dyspnea    Essential hypertension    GERD (gastroesophageal reflux disease)    Glaucoma    History of kidney stones    History of left heart catheterization (LHC) 09/11/2011   a.) LHC 09/11/2011: minor luminal irregulaties mLAD, mLCx, mRCA - med mgmt.   Hyperlipidemia    Insomnia    a.) takes suvorexant    Long term current use of aspirin     MI (myocardial infarction) (HCC)    a.) in the 1990s; details unclear   Migraine with aura    Neuropathy    OSA on CPAP    Rotator cuff injury    Seasonal allergies    Tendinitis of right rotator cuff    TIA (transient ischemic attack)     Assessment/Plan: 10 Days Post-Op Procedure(s) (LRB): ARTHROPLASTY, HIP, TOTAL,POSTERIOR APPROACH (Right) Principal Problem:   Status post total hip replacement,  right  Estimated body mass index is 48.82 kg/m as calculated from the following:   Height as of this encounter: 5\' 3"  (1.6 m).   Weight as of this encounter: 125 kg. Advance diet Up with therapy  Vital signs are stable Pain well-controlled She has had a BM at this time. Care management to assist with discharge home. Order has been placed for [facility] assistance with HHPT Patient stable for discharge to home, will look to discharge tomorrow for access to care. Pending HHPT placement  Patient did report some calf discomfort to palpation this AM, denies SOB. Ordered US  of RLE  DVT Prophylaxis -  Eliquis  and  SCDs Weight-Bearing as tolerated to right leg Will plan to follow up with [facility] orthopedics 2 weeks postoperatively  Standley Earing, PA-C [facility] 03/02/2024, 8:21 AM

## 2024-03-03 DIAGNOSIS — M1611 Unilateral primary osteoarthritis, right hip: Secondary | ICD-10-CM | POA: Diagnosis not present

## 2024-03-03 NOTE — Progress Notes (Signed)
 Subjective: 11 Days Post-Op Procedure(s) (LRB): ARTHROPLASTY, HIP, TOTAL,POSTERIOR APPROACH (Right) Patient reports pain as mild.  Reports the the hip is getting better each day. Patient is well, and has had no acute complaints or problems Plan is for discharge home today. Negative for chest pain and shortness of breath Fever: no Gastrointestinal:Negative for nausea and vomiting She has had a BM.   Objective: Vital signs in last 24 hours: Temp:  [98 F (36.7 C)] 98 F (36.7 C) (04/21 0424) Pulse Rate:  [77-92] 77 (04/21 0424) Resp:  [16-17] 17 (04/21 0424) BP: (99-109)/(48-87) 103/48 (04/21 0424) SpO2:  [97 %-100 %] 100 % (04/21 0424)  Intake/Output from previous day:  Intake/Output Summary (Last 24 hours) at 03/03/2024 0743 Last data filed at 03/02/2024 2300 Gross per 24 hour  Intake 440 ml  Output --  Net 440 ml     Intake/Output this shift: No intake/output data recorded.  Labs: Recent Labs    03/01/24 0424  HGB 11.5*    Recent Labs    03/01/24 0424  WBC 8.3  RBC 4.19  HCT 33.9*  PLT 481*    Recent Labs    03/01/24 0424  CREATININE 1.16*    No results for input(s): "LABPT", "INR" in the last 72 hours.   EXAM General - Patient is Alert, Appropriate, and Oriented Extremity - Neurovascular intact Dorsiflexion/Plantar flexion intact Incision: dressing C/D/I and no drainage No cellulitis present Compartment soft Dressing/Incision - Honeycomb intact to the right hip.  Honeycomb dressing removed, staples removed without issue, steri-strips applied. Motor Function - intact, moving foot and toes well on exam.   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   Bladder leak    Cardiac murmur    Chest pain    a.) 10/07/2008 Neg Dobuatmine Echo; b.) LHC 09/11/2011 - minor lum irregs; no sig CAD; c.) MV 06/02/2015: no ischemia; d.) cCTA 02/2021: Ca2+ = 0. Normal coronaries   CKD (chronic kidney disease), stage III (HCC)    COPD (chronic obstructive pulmonary disease) (HCC)    Depression    Diet-controlled type 2 diabetes mellitus (HCC)    Dyspnea    Essential hypertension    GERD (gastroesophageal reflux disease)    Glaucoma    History of kidney stones    History of left heart catheterization (LHC) 09/11/2011   a.) LHC 09/11/2011: minor luminal irregulaties mLAD, mLCx, mRCA - med mgmt.   Hyperlipidemia    Insomnia    a.) takes suvorexant    Long term current use of aspirin     MI (myocardial infarction) (HCC)    a.) in the 1990s; details unclear   Migraine with aura    Neuropathy    OSA on CPAP    Rotator cuff injury    Seasonal allergies    Tendinitis of right rotator cuff    TIA (transient ischemic attack)     Assessment/Plan: 11 Days Post-Op Procedure(s) (LRB): ARTHROPLASTY, HIP, TOTAL,POSTERIOR APPROACH (Right) Principal Problem:   Status post  total hip replacement, right  Estimated body mass index is 48.82 kg/m as calculated from the following:   Height as of this encounter: 5\' 3"  (1.6 m).   Weight as of this encounter: 125 kg. Advance diet Up with therapy  Vital signs are stable Pain well-controlled She has had a BM at this time. Care management to assist with discharge home. Order has been placed for [facility] assistance with HHPT Patient stable for discharge to home today. Staples removed today.  Follow-up with [facility] in 4 weeks for x-rays of the right hip.  DVT Prophylaxis -  Eliquis  and SCDs Weight-Bearing as tolerated to right  leg   J. Edie Goon, PA-C, CAQ-OS [facility] Orthopaedic [facility] 03/03/2024, 7:43 AM

## 2024-03-03 NOTE — TOC Progression Note (Signed)
 Transition of Care [facility]) - Progression Note    Patient Details  Name: OSIE MERKIN MRN: [id number] Date of Birth: 1956/08/28  Transition of Care [facility]) CM/SW Contact  Crayton Docker, RN 03/03/2024, 10:04 AM  Clinical Narrative:     Discharge orders noted. Home health PT orders noted. Rolling walker and bariatric bedside commode noted. Noted, contact to [facility] regarding home health services. CM follow up call to [facility], [facility], phone. [phone] regarding home health PT. Per Thurston Flow, confirmed agency has received home health referral and start of care is within 24-48 hours. CM secure message to [facility], regarding DME and delivery to bedside. CM to patient's room regarding pending discharge. Per patient verbalized readiness to discharge. Per patient, caregiver will provide transportation and arrive around 1100.     Barriers to Discharge: No Barriers Identified  Expected Discharge Plan and Services    Expected Discharge Date: 03/03/24               DME Arranged: 3-N-1Otho Blitz wide DME Agency: [employer] Date DME Agency Contacted: 03/03/24 Time DME Agency Contacted: 9 Representative spoke with at DME Agency: Harriet Limber HH Arranged: PT HH Agency: [facility] Date [facility] Agency Contacted: 03/03/24 Time HH Agency Contacted: 1003 Representative spoke with at [facility] Agency: Thurston Flow   Social Determinants of Health (SDOH) Interventions SDOH Screenings   Food Insecurity: Food Insecurity Present (02/21/2024)  Housing: High Risk (02/21/2024)  Transportation Needs: Unmet Transportation Needs (02/21/2024)  Utilities: Not At Risk (02/21/2024)  Financial Resource Strain: Low Risk  (11/30/2023)   Received from [facility]  Social Connections: Moderately Integrated (02/21/2024)  Tobacco Use: High Risk (02/21/2024)    Readmission Risk Interventions     No data to display

## 2024-03-03 NOTE — TOC Transition Note (Signed)
 Transition of Care [facility]) - Discharge Note   Patient Details  Name: RAYSA BOSAK MRN: [id number] Date of Birth: [birth year]  Transition of Care [facility]) CM/SW Contact:  Crayton Docker, RN Phone Number: 03/03/2024, 12:38 PM   Clinical Narrative:     Discharge orders noted. Patient to discharge home with home health and caregiver support. DME: rolling walker and bariatric bedside commode to be delivered to bedside.   Final next level of care: Home w Home Health Services Barriers to Discharge: No Barriers Identified   Patient Goals and CMS Choice    Home with home health DME: RW, BSC(bariatric)   Discharge Placement               Home health    Discharge Plan and Services Additional resources added to the After Visit Summary for                  DME Arranged: Bedside commode, Walker DME Agency: [employer] Date DME Agency Contacted: 03/03/24 Time DME Agency Contacted: 1003 Representative spoke with at DME Agency: Harriet Limber HH Arranged: PT HH Agency: [facility] Date [facility] Agency Contacted: 03/03/24 Time HH Agency Contacted: 1003 Representative spoke with at [facility] Agency: Thurston Flow  Social Drivers of Health (SDOH) Interventions SDOH Screenings   Food Insecurity: Food Insecurity Present (02/21/2024)  Housing: High Risk (02/21/2024)  Transportation Needs: Unmet Transportation Needs (02/21/2024)  Utilities: Not At Risk (02/21/2024)  Financial Resource Strain: Low Risk  (11/30/2023)   Received from [facility]  Social Connections: Moderately Integrated (02/21/2024)  Tobacco Use: High Risk (02/21/2024)     Readmission Risk Interventions     No data to display

## 2024-03-03 NOTE — Progress Notes (Signed)
 Occupational Therapy Treatment Patient Details Name: Kristine Rangel MRN: [id number] DOB: [birth year] Today's Date: 03/03/2024   History of present illness Pt is 68 yo female s/p R THA on 02/21/24. PMH of asthma, COPD, former smoker, past MI, HTN, anxiety. depression, CVA, s/p brain surgery (anuerysm repair), DMII, renal disease, CHF, rotator cuff surgery, glaucoma, R TKA revision, R TSA 08/02/23.   OT comments  Pt seen for OT tx focused on bathing, dressing, and toileting. Pt endorsing 7/10 R hip pain this morning following staples removal per pt report. RN already aware per pt. Pt required increased time/effort to complete bed mobility but no direct assist required. Pt ambulated to the bathroom, requiring 1 VC for RW/sequencing to improve safety prior to transferring to [facility] over toilet. Pt completed grooming and bathing tasks seated on toilet with set up and MIN A for washing her back and LB bathing and dressing in order to maintain precautions. Further instruction provided this date in maintaining posterior hip precautions during ADLs and ADL mobility when turning to the R. Pt demonstrated understanding. Progressing well, continues to benefit.       If plan is discharge home, recommend the following:  A lot of help with bathing/dressing/bathroom;Assist for transportation;Assistance with cooking/housework;Help with stairs or ramp for entrance;A little help with walking and/or transfers   Equipment Recommendations  Other (comment) (defer)    Recommendations for Other Services      Precautions / Restrictions Precautions Precautions: Fall;Posterior Hip Recall of Precautions/Restrictions: Intact Precaution/Restrictions Comments: PRN VC for precautions during ADL/mobility Restrictions Weight Bearing Restrictions Per Provider Order: Yes RLE Weight Bearing Per Provider Order: Weight bearing as tolerated       Mobility Bed Mobility Overal bed mobility: Needs Assistance Bed Mobility: Supine to  Sit     Supine to sit: HOB elevated, Used rails, Modified independent (Device/Increase time)          Transfers Overall transfer level: Needs assistance Equipment used: Rolling walker (2 wheels) Transfers: Sit to/from Stand Sit to Stand: Supervision           General transfer comment: VC for sequencing/RLE placement to maintain precautions     Balance Overall balance assessment: Mild deficits observed, not formally tested                                         ADL either performed or assessed with clinical judgement   ADL Overall ADL's : Needs assistance/impaired     Grooming: Sitting;Wash/dry face;Wash/dry hands;Applying deodorant;Modified independent   Upper Body Bathing: Set up;Sitting;Minimal assistance Upper Body Bathing Details (indicate cue type and reason): MIN A for back, set up for rest Lower Body Bathing: Sitting/lateral leans;Minimal assistance Lower Body Bathing Details (indicate cue type and reason): for BLE distal of knees Upper Body Dressing : Standing;Supervision/safety;Set up   Lower Body Dressing: Sit to/from stand;Minimal assistance Lower Body Dressing Details (indicate cue type and reason): MIN A to thread over feet to knees, instructed in alternating UE support on grab bar or RW for improved stability while completing donning over hips Toilet Transfer: Contact guard assist;Ambulation;Comfort height toilet;Rolling walker (2 wheels);Grab bars Toilet Transfer Details (indicate cue type and reason): BSC over toilet Toileting- Clothing Manipulation and Hygiene: Modified independent;Sit to/from stand       Functional mobility during ADLs: Contact guard assist;Rolling walker (2 wheels)      Extremity/Trunk Assessment  Vision       Perception     Praxis     Communication Communication Communication: No apparent difficulties   Cognition Arousal: Alert Behavior During Therapy: WFL for tasks  assessed/performed Cognition: No apparent impairments             OT - Cognition Comments: A/Ox4                 Following commands: Intact        Cueing   Cueing Techniques: Verbal cues, Tactile cues  Exercises Other Exercises Other Exercises: further instruction in maintaining posterior hip precautions during ADLs and ADL mobility when turning to the R    Shoulder Instructions       General Comments      Pertinent Vitals/ Pain       Pain Assessment Pain Assessment: 0-10 Pain Score: 7  Pain Location: R leg from hip after staples removed earlier Pain Descriptors / Indicators: Discomfort, Aching Pain Intervention(s): Limited activity within patient's tolerance, Monitored during session, Repositioned, Other (comment) (RN aware)  Home Living                                          Prior Functioning/Environment              Frequency  Min 2X/week        Progress Toward Goals  OT Goals(current goals can now be found in the care plan section)  Progress towards OT goals: Progressing toward goals  Acute Rehab OT Goals Patient Stated Goal: decrease pain and get stronger OT Goal Formulation: With patient Time For Goal Achievement: 03/08/24 Potential to Achieve Goals: Good  Plan      Co-evaluation                 AM-PAC OT "6 Clicks" Daily Activity     Outcome Measure   Help from another person eating meals?: None Help from another person taking care of personal grooming?: None Help from another person toileting, which includes using toliet, bedpan, or urinal?: A Little Help from another person bathing (including washing, rinsing, drying)?: A Little Help from another person to put on and taking off regular upper body clothing?: A Little Help from another person to put on and taking off regular lower body clothing?: A Little 6 Click Score: 20    End of Session Equipment Utilized During Treatment: Rolling walker (2  wheels)  OT Visit Diagnosis: Unsteadiness on feet (R26.81);Repeated falls (R29.6);Muscle weakness (generalized) (M62.81)   Activity Tolerance Patient tolerated treatment well   Patient Left in chair;with call bell/phone within reach;with chair alarm set   Nurse Communication          Time: [phone] OT Time Calculation (min): 30 min  Charges: OT General Charges $OT Visit: 1 Visit OT Treatments $Self Care/Home Management : 23-37 mins  Berenda Breaker., MPH, MS, OTR/L ascom [phone] 03/03/24, 9:16 AM

## 2024-06-10 ENCOUNTER — Other Ambulatory Visit: Payer: Self-pay | Admitting: Internal Medicine

## 2024-06-11 NOTE — Telephone Encounter (Signed)
 Patient need to contact the clinic for appointment.

## 2024-06-20 ENCOUNTER — Other Ambulatory Visit: Payer: Self-pay | Admitting: Surgery

## 2024-06-23 ENCOUNTER — Telehealth: Payer: Self-pay

## 2024-06-23 NOTE — Telephone Encounter (Signed)
   Pre-operative Risk Assessment    Patient Name: Kristine Rangel  DOB: Jun 13, 1956 MRN: [id number]   Date of last office visit: 07/27/23 CHRISTOPHER END, MD Date of next office visit: NONE   Request for Surgical Clearance    Procedure:  ARTHROPLASTY, HIP, TOTAL,POSTERIOR APPROACH  Date of Surgery:  Clearance 07/03/24                                Surgeon:  Dr. Norleen Maltos, MD  Surgeon's Group or Practice Name:  [facility]  Phone number:  [phone] Fax number:  [phone]   Type of Clearance Requested:   - Medical  - Pharmacy:  Hold Apixaban  (Eliquis )     Type of Anesthesia:  General    Additional requests/questions:    SignedLucie DELENA Ku   06/23/2024, 9:10 AM     Elnor Dorise BRAVO, NP  P Cv Div Preop Callback Request for pre-operative cardiac clearance:   1. What type of surgery is being performed? ARTHROPLASTY, HIP, TOTAL,POSTERIOR APPROACH  2. When is this surgery scheduled? 07/03/2024   3. Type of clearance being requested (medical, pharmacy, both)? BOTH   4. Are there any medications that need to be held prior to surgery? APIXABAN   5. Practice name and name of physician performing surgery? Performing surgeon: Dr. Norleen Maltos, MD Requesting clearance: Dorise Elnor, FNP-C     6. Anesthesia type (none, local, MAC, general)? GENERAL  7. What is the office phone and fax number?   Phone: [phone] Fax: [phone]  ATTENTION: Unable to create telephone message as per your standard workflow. Directed by [facility] providers to send requests for cardiac clearance to this pool for appropriate distribution to provider covering pre-operative clearances.  Dorise Elnor, MSN, APRN, FNP-C, CEN [facility] Peri-operative Services Nurse Practitioner Phone: [phone] 06/21/24 12:16 AM

## 2024-06-23 NOTE — Telephone Encounter (Signed)
-----   Message from Kristine Rangel sent at 06/21/2024 12:16 AM EDT ----- Regarding: Request for pre-operative cardiac clearance Request for pre-operative cardiac clearance:  1. What type of surgery is being performed?  ARTHROPLASTY, HIP, TOTAL,POSTERIOR APPROACH  2. When is this surgery scheduled?  07/03/2024  3. Type of clearance being requested (medical, pharmacy, both)? BOTH   4. Are there any medications that need to be held prior to surgery? APIXABAN   5. Practice name and name of physician performing surgery?  Performing surgeon: Dr. Norleen Maltos, MD Requesting clearance: Kristine Pereyra, FNP-C    6. Anesthesia type (none, local, MAC, general)? GENERAL  7. What is the office phone and fax number?   Phone: [phone] Fax: [phone]  ATTENTION: Unable to create telephone message as per your standard workflow. Directed by [facility] providers to send requests for cardiac clearance to this pool for appropriate distribution to provider covering pre-operative clearances.   Kristine Pereyra, MSN, APRN, FNP-C, CEN [facility]  Peri-operative Services Nurse Practitioner Phone: [phone] 06/21/24 12:16 AM

## 2024-06-26 ENCOUNTER — Encounter
Admission: RE | Admit: 2024-06-26 | Discharge: 2024-06-26 | Disposition: A | Discharge status: Home / Self Care | Source: Ambulatory Visit | Attending: Surgery | Admitting: Surgery

## 2024-06-26 ENCOUNTER — Other Ambulatory Visit: Payer: Self-pay

## 2024-06-26 VITALS — BP 99/77 | HR 75 | Resp 16 | Ht 63.0 in | Wt 270.7 lb

## 2024-06-26 DIAGNOSIS — Z01812 Encounter for preprocedural laboratory examination: Secondary | ICD-10-CM | POA: Insufficient documentation

## 2024-06-26 DIAGNOSIS — E119 Type 2 diabetes mellitus without complications: Secondary | ICD-10-CM | POA: Insufficient documentation

## 2024-06-26 HISTORY — DX: Anemia, unspecified: D64.9

## 2024-06-26 HISTORY — DX: Unilateral primary osteoarthritis, left hip: M16.12

## 2024-06-26 LAB — URINALYSIS, ROUTINE W REFLEX MICROSCOPIC
Bilirubin Urine: NEGATIVE
Glucose, UA: >=500 mg/dL — AB
Hgb urine dipstick: NEGATIVE
Ketones, ur: NEGATIVE mg/dL
Leukocytes,Ua: NEGATIVE
Nitrite: NEGATIVE
Protein, ur: NEGATIVE mg/dL
Specific Gravity, Urine: 1.02 (ref 1.005–1.030)
pH: 5 (ref 5.0–8.0)

## 2024-06-26 LAB — COMPREHENSIVE METABOLIC PANEL WITH GFR
ALT: 11 U/L (ref 0–44)
AST: 18 U/L (ref 15–41)
Albumin: 3.5 g/dL (ref 3.5–5.0)
Alkaline Phosphatase: 101 U/L (ref 38–126)
Anion gap: 10 (ref 5–15)
BUN: 13 mg/dL (ref 8–23)
CO2: 26 mmol/L (ref 22–32)
Calcium: 9.9 mg/dL (ref 8.9–10.3)
Chloride: 104 mmol/L (ref 98–111)
Creatinine, Ser: 1.08 mg/dL — ABNORMAL HIGH (ref 0.44–1.00)
GFR, Estimated: 55 mL/min — ABNORMAL LOW (ref >60)
Glucose, Bld: 91 mg/dL (ref 70–99)
Potassium: 3.8 mmol/L (ref 3.5–5.1)
Sodium: 140 mmol/L (ref 135–145)
Total Bilirubin: 0.6 mg/dL (ref 0.0–1.2)
Total Protein: 6.7 g/dL (ref 6.5–8.1)

## 2024-06-26 LAB — TYPE AND SCREEN
ABO/RH(D): O POS
Antibody Screen: NEGATIVE

## 2024-06-26 LAB — CBC WITH DIFFERENTIAL/PLATELET
Abs Immature Granulocytes: 0 K/uL (ref 0.00–0.07)
Basophils Absolute: 0 K/uL (ref 0.0–0.1)
Basophils Relative: 1 %
Eosinophils Absolute: 0.4 K/uL (ref 0.0–0.5)
Eosinophils Relative: 10 %
HCT: 38 % (ref 36.0–46.0)
Hemoglobin: 11.8 g/dL — ABNORMAL LOW (ref 12.0–15.0)
Immature Granulocytes: 0 %
Lymphocytes Relative: 42 %
Lymphs Abs: 1.8 K/uL (ref 0.7–4.0)
MCH: 26 pg (ref 26.0–34.0)
MCHC: 31.1 g/dL (ref 30.0–36.0)
MCV: 83.9 fL (ref 80.0–100.0)
Monocytes Absolute: 0.4 K/uL (ref 0.1–1.0)
Monocytes Relative: 10 %
Neutro Abs: 1.6 K/uL — ABNORMAL LOW (ref 1.7–7.7)
Neutrophils Relative %: 37 %
Platelets: 348 K/uL (ref 150–400)
RBC: 4.53 MIL/uL (ref 3.87–5.11)
RDW: 15.9 % — ABNORMAL HIGH (ref 11.5–15.5)
WBC: 4.2 K/uL (ref 4.0–10.5)
nRBC: 0 % (ref 0.0–0.2)

## 2024-06-26 LAB — SURGICAL PCR SCREEN
MRSA, PCR: NEGATIVE
Staphylococcus aureus: POSITIVE — AB

## 2024-06-26 NOTE — Patient Instructions (Addendum)
 Your procedure is scheduled on: 07/03/24  Report to the Registration Desk on the 1st floor of the [facility]. To find out your arrival time, please call [phone] between 1PM - 3PM on: 07/02/24  If your arrival time is 6:00 am, do not arrive before that time as the [facility] entrance doors do not open until 6:00 am.  REMEMBER: Instructions that are not followed completely may result in serious medical risk, up to and including death; or upon the discretion of your surgeon and anesthesiologist your surgery may need to be rescheduled.  Do not eat food after midnight the night before surgery.  No gum chewing or hard candies.  You may however, drink CLEAR liquids up to 2 hours before you are scheduled to arrive for your surgery. Do not drink anything within 2 hours of your scheduled arrival time.  Clear liquids include: - water    In addition, your doctor has ordered for you to drink the provided:  Gatorade G2 Drinking this carbohydrate drink up to two hours before surgery helps to reduce insulin  resistance and improve patient outcomes. Please complete drinking 2 hours before scheduled arrival time.  One week prior to surgery: Stop Anti-inflammatories (NSAIDS) such as Advil, Aleve, Ibuprofen, Motrin, Naproxen, Naprosyn and Aspirin  based products such as Excedrin, Goody's Powder, BC Powder. You may take acetaminophen  (TYLENOL ) as needed.  Stop ANY OVER THE COUNTER supplements until after surgery : Tumeric  empagliflozin  (JARDIANCE ) - Starting 08/18, hold for 3 days before surgery. Do NOT take any more jardiance  until AFTER surgery.   Ozempic - hold for 7 days before surgery. Last day you took was 08/13. Resume AFTER surgery on your regular weekly day,    Continue taking all of your other prescription medications up until the day of surgery.   ON THE DAY OF SURGERY ONLY TAKE THESE MEDICATIONS WITH SIPS OF WATER :   Albuterol  nebulizer Carvedilol  Esomeprazole  (Nexium) Gabapentin  Symbicort inhaler mirabegron  ER (MYRBETRIQ )  tiZANidine  (ZANAFLEX )    Use inhalers on the day of surgery and bring your albuterol  inhaler to the hospital.  No Alcohol for 24 hours before or after surgery.  No Smoking including e-cigarettes for 24 hours before surgery.  No chewable tobacco products for at least 6 hours before surgery.  No nicotine patches on the day of surgery.  Do not use any recreational drugs for at least a week (preferably 2 weeks) before your surgery.  Please be advised that the combination of cocaine and anesthesia may have negative outcomes, up to and including death. If you test positive for cocaine, your surgery will be cancelled.  On the morning of surgery brush your teeth with toothpaste and water , you may rinse your mouth with mouthwash if you wish. Do not swallow any toothpaste or mouthwash.  Use CHG Soap or wipes as directed on instruction sheet.  Do not wear jewelry, make-up, hairpins, clips or nail polish.  For welded (permanent) jewelry: bracelets, anklets, waist bands, etc.  Please have this removed prior to surgery.  If it is not removed, there is a chance that hospital personnel will need to cut it off on the day of surgery.  Do not wear lotions, powders, or perfumes.   Do not shave body hair from the neck down 48 hours before surgery.  Contact lenses, hearing aids and dentures may not be worn into surgery.  Do not bring valuables to the hospital. [facility] is not responsible for any missing/lost belongings or valuables.   Notify your  doctor if there is any change in your medical condition (cold, fever, infection).  Wear comfortable clothing (specific to your surgery type) to the hospital.  After surgery, you can help prevent lung complications by doing breathing exercises.  Take deep breaths and cough every 1-2 hours. Your doctor may order a device called an Incentive Spirometer to help you take deep breaths. When  coughing or sneezing, hold a pillow firmly against your incision with both hands. This is called "splinting." Doing this helps protect your incision. It also decreases belly discomfort.  If you are being admitted to the hospital overnight, leave your suitcase in the car. After surgery it may be brought to your room.  In case of increased patient census, it may be necessary for you, the patient, to continue your postoperative care in the Same Day Surgery department.  If you are being discharged the day of surgery, you will not be allowed to drive home. You will need a responsible individual to drive you home and stay with you for 24 hours after surgery.   If you are taking public transportation, you will need to have a responsible individual with you.  Please call the Pre-admissions Testing Dept. at [phone] if you have any questions about these instructions.  Surgery Visitation Policy:  Patients having surgery or a procedure may have two visitors.  Children under the age of 69 must have an adult with them who is not the patient.  Inpatient Visitation:    Visiting hours are 7 a.m. to 8 p.m. Up to four visitors are allowed at one time in a patient room. The visitors may rotate out with other people during the day.  One visitor age 30 or older may stay with the patient overnight and must be in the room by 8 p.m.   Merchandiser, retail to address health-related social needs:  [email or web address]    Pre-operative 5 CHG Bath Instructions   You can play a key role in reducing the risk of infection after surgery. Your skin needs to be as free of germs as possible. You can reduce the number of germs on your skin by washing with CHG (chlorhexidine  gluconate) soap before surgery. CHG is an antiseptic soap that kills germs and continues to kill germs even after washing.   DO NOT use if you have an allergy to chlorhexidine /CHG or antibacterial soaps. If your skin  becomes reddened or irritated, stop using the CHG and notify one of our RNs at [phone].   Please shower with the CHG soap starting 4 days before surgery using the following schedule: 08/17 - 08/21.    Please keep in mind the following:  DO NOT shave, including legs and underarms, starting the day of your first shower.   You may shave your face at any point before/day of surgery.  Place clean sheets on your bed the day you start using CHG soap. Use a clean washcloth (not used since being washed) for each shower. DO NOT sleep with pets once you start using the CHG.   CHG Shower Instructions:  If you choose to wash your hair and private area, wash first with your normal shampoo/soap.  After you use shampoo/soap, rinse your hair and body thoroughly to remove shampoo/soap residue.  Turn the water  OFF and apply about 3 tablespoons (45 ml) of CHG soap to a CLEAN washcloth.  Apply CHG soap ONLY FROM YOUR NECK DOWN TO YOUR TOES (washing for 3-5 minutes)  DO NOT use CHG soap on  face, private areas, open wounds, or sores.  Pay special attention to the area where your surgery is being performed.  If you are having back surgery, having someone wash your back for you may be helpful. Wait 2 minutes after CHG soap is applied, then you may rinse off the CHG soap.  Pat dry with a clean towel  Put on clean clothes/pajamas   If you choose to wear lotion, please use ONLY the CHG-compatible lotions on the back of this paper.     Additional instructions for the day of surgery: DO NOT APPLY any lotions, deodorants, cologne, or perfumes.   Put on clean/comfortable clothes.  Brush your teeth.  Ask your nurse before applying any prescription medications to the skin.      CHG Compatible Lotions   Aveeno Moisturizing lotion  Cetaphil Moisturizing Cream  Cetaphil Moisturizing Lotion  Clairol Herbal Essence Moisturizing Lotion, Dry Skin  Clairol Herbal Essence Moisturizing Lotion, Extra Dry Skin   Clairol Herbal Essence Moisturizing Lotion, Normal Skin  Curel Age Defying Therapeutic Moisturizing Lotion with Alpha Hydroxy  Curel Extreme Care Body Lotion  Curel Soothing Hands Moisturizing Hand Lotion  Curel Therapeutic Moisturizing Cream, Fragrance-Free  Curel Therapeutic Moisturizing Lotion, Fragrance-Free  Curel Therapeutic Moisturizing Lotion, Original Formula  Eucerin Daily Replenishing Lotion  Eucerin Dry Skin Therapy Plus Alpha Hydroxy Crme  Eucerin Dry Skin Therapy Plus Alpha Hydroxy Lotion  Eucerin Original Crme  Eucerin Original Lotion  Eucerin Plus Crme Eucerin Plus Lotion  Eucerin TriLipid Replenishing Lotion  Keri Anti-Bacterial Hand Lotion  Keri Deep Conditioning Original Lotion Dry Skin Formula Softly Scented  Keri Deep Conditioning Original Lotion, Fragrance Free Sensitive Skin Formula  Keri Lotion Fast Absorbing Fragrance Free Sensitive Skin Formula  Keri Lotion Fast Absorbing Softly Scented Dry Skin Formula  Keri Original Lotion  Keri Skin Renewal Lotion Keri Silky Smooth Lotion  Keri Silky Smooth Sensitive Skin Lotion  Nivea Body Creamy Conditioning Oil  Nivea Body Extra Enriched Lotion  Nivea Body Original Lotion  Nivea Body Sheer Moisturizing Lotion Nivea Crme  Nivea Skin Firming Lotion  NutraDerm 30 Skin Lotion  NutraDerm Skin Lotion  NutraDerm Therapeutic Skin Cream  NutraDerm Therapeutic Skin Lotion  ProShield Protective Hand Cream  Provon moisturizing lotion  How to Use an Incentive Spirometer  An incentive spirometer is a tool that measures how well you are filling your lungs with each breath. Learning to take long, deep breaths using this tool can help you keep your lungs clear and active. This may help to reverse or lessen your chance of developing breathing (pulmonary) problems, especially infection. You may be asked to use a spirometer: After a surgery. If you have a lung problem or a history of smoking. After a long period of time when you  have been unable to move or be active. If the spirometer includes an indicator to show the highest number that you have reached, your health care provider or respiratory therapist will help you set a goal. Keep a log of your progress as told by your health care provider. What are the risks? Breathing too quickly may cause dizziness or cause you to pass out. Take your time so you do not get dizzy or light-headed. If you are in pain, you may need to take pain medicine before doing incentive spirometry. It is harder to take a deep breath if you are having pain. How to use your incentive spirometer  Sit up on the edge of your bed or on a chair. Hold  the incentive spirometer so that it is in an upright position. Before you use the spirometer, breathe out normally. Place the mouthpiece in your mouth. Make sure your lips are closed tightly around it. Breathe in slowly and as deeply as you can through your mouth, causing the piston or the ball to rise toward the top of the chamber. Hold your breath for 3-5 seconds, or for as long as possible. If the spirometer includes a coach indicator, use this to guide you in breathing. Slow down your breathing if the indicator goes above the marked areas. Remove the mouthpiece from your mouth and breathe out normally. The piston or ball will return to the bottom of the chamber. Rest for a few seconds, then repeat the steps 10 or more times. Take your time and take a few normal breaths between deep breaths so that you do not get dizzy or light-headed. Do this every 1-2 hours when you are awake. If the spirometer includes a goal marker to show the highest number you have reached (best effort), use this as a goal to work toward during each repetition. After each set of 10 deep breaths, cough a few times. This will help to make sure that your lungs are clear. If you have an incision on your chest or abdomen from surgery, place a pillow or a rolled-up towel firmly against  the incision when you cough. This can help to reduce pain while taking deep breaths and coughing. General tips When you are able to get out of bed: Walk around often. Continue to take deep breaths and cough in order to clear your lungs. Keep using the incentive spirometer until your health care provider says it is okay to stop using it. If you have been in the hospital, you may be told to keep using the spirometer at home. Contact a health care provider if: You are having difficulty using the spirometer. You have trouble using the spirometer as often as instructed. Your pain medicine is not giving enough relief for you to use the spirometer as told. You have a fever. Get help right away if: You develop shortness of breath. You develop a cough with bloody mucus from the lungs. You have fluid or blood coming from an incision site after you cough. Summary An incentive spirometer is a tool that can help you learn to take long, deep breaths to keep your lungs clear and active. You may be asked to use a spirometer after a surgery, if you have a lung problem or a history of smoking, or if you have been inactive for a long period of time. Use your incentive spirometer as instructed every 1-2 hours while you are awake. If you have an incision on your chest or abdomen, place a pillow or a rolled-up towel firmly against your incision when you cough. This will help to reduce pain. Get help right away if you have shortness of breath, you cough up bloody mucus, or blood comes from your incision when you cough. This information is not intended to replace advice given to you by your health care provider. Make sure you discuss any questions you have with your health care provider. Document Revised: 01/19/2020 Document Reviewed: 01/19/2020 Elsevier Patient Education  2023 [employer].

## 2024-07-02 ENCOUNTER — Encounter: Payer: Self-pay | Admitting: Surgery

## 2024-07-02 NOTE — Progress Notes (Signed)
 Perioperative / Anesthesia Services  Pre-Admission Testing Clinical Review / Pre-Operative Anesthesia Consult  Date: 07/02/24  Patient Demographics:  Name: Kristine Rangel DOB:   08-16-1956 MRN:   [id number]  Planned Surgical Procedure(s):    Case: [id number] Date/Time: 07/03/24 0715   Procedure: ARTHROPLASTY, HIP, TOTAL,POSTERIOR APPROACH (Left: Hip)   Anesthesia type: Choice   Diagnosis: Primary osteoarthritis of left hip [M16.12]   Pre-op diagnosis: Primary osteoarthritis of left hip   Location: [facility] OR ROOM 02 / [facility]   Surgeons: Edie Norleen PARAS, MD     NOTE: Available PAT nursing documentation and vital signs have been reviewed. Clinical nursing staff has updated patient's PMH/PSHx, current medication list, and drug allergies/intolerances to ensure comprehensive history available to assist in medical decision making as it pertains to the aforementioned surgical procedure and anticipated anesthetic course. Extensive review of available clinical information personally performed. [place] PM[facility] and PSHx updated with any diagnoses/procedures that  may have been inadvertently omitted during her intake with the pre-admission testing department's nursing staff.  Clinical Discussion:  Kristine Rangel is a 68 y.o. female who is submitted for pre-surgical anesthesia review and clearance prior to her undergoing the above procedure. Patient is a Former Games developer. Pertinent PMH includes: MI, HFpEF, basilar artery aneurysm (s/p coiling), TIA, angina, cardiac murmur, HTN, HLD, T2DM, CKD-III, COPD, asthma, OSAH (requires nocturnal PAP therapy), GERD (on daily PPI), nephrolithiasis, glaucoma, OA, rotator cuff arthropathy, anxiety, depression, insomnia.  Patient is followed by cardiology (End, MD). She was last seen in the cardiology clinic on 07/27/2023; notes reviewed. At the time of her clinic visit, patient doing well overall from a cardiovascular perspective.  Patient's main  complaint in clinic was progressive pain in her RIGHT shoulder.  Patient denied any chest pain, shortness of breath, PND, orthopnea, palpitations, significant peripheral edema, weakness, fatigue, vertiginous symptoms, or presyncope/syncope. Patient with a past medical history significant for cardiovascular diagnoses. Documented physical exam was grossly benign, providing no evidence of acute exacerbation and/or decompensation of the patient's known cardiovascular conditions.  Patient with a history of an intracranial artery aneurysm.  She underwent coil embolization of the mid basilar artery on 04/05/2009.  Post coiling, there was a filling defect noted just distal to the coils consistent with intraluminal thrombus.  Patient was treated with Reopro bolus followed by a 12-hour drip.  Subsequent imaging revealed resolution and no evidence of other concerning findings.   Patient underwent diagnostic LEFT heart catheterization on 09/11/2011.  Study revealed minor luminal irregularities within the mid LAD, mid LCx, and mid RCA.  There was no significant coronary artery disease noted.  Recommendations were for medical management.  Myocardial perfusion imaging study was performed on 06/02/2015 revealing a normal left trickle systolic function with an EF of 54%.  There was no evidence of stress-induced myocardial ischemia or arrhythmia; no scintigraphic evidence of scar.  Study determined to be normal and low risk. Coronary CTA was performed on 02/24/2021 revealing a normal coronary calcium  score (0).  Study demonstrated normal coronary origin with RIGHT-sided dominance.  There were no significant extracardiac findings.  Most recent TTE was performed on 05/17/2022 revealing normal left ventricular systolic function with an EF of 60-65%.  There were no regional wall motion abnormalities. Left ventricular diastolic Doppler parameters consistent with abnormal relaxation (G1DD).  Right ventricular size and function  normal.  There was no valvular regurgitation. All transvalvular gradients were noted to be normal providing no evidence suggestive of valvular stenosis. Aorta normal in  size with no evidence of aneurysmal dilatation.  Blood pressure well controlled at 126/78 mmHg on currently prescribed beta-blocker (carvedilol ,) diuretic (furosemide ), and nitrate (isosorbide  mononitrate) therapies. Patient is on atorvastatin  for her HLD diagnosis and ASCVD prevention.  T2DM being controlled with diet and lifestyle modification alone; no recent A1c for review.  In the setting of known cardiovascular disease and concurrent T2DM, patient is on an SGLT2i (empagliflozin ) for added cardiovascular and renovascular protection.  Functional capacity somewhat limited by patient's age, obesity, and multiple medical comorbidities.  She is able to ambulate with a rolling walker.  Patient able to complete all of her ADLs/ADLs without significant cardiovascular limitation.  Per the DASI, patient is able to achieve at least 4 METS of physical activity without experiencing any significant degree of angina/anginal equivalent symptoms.  No changes were made to her medication regimen.  Patient to follow-up with outpatient cardiology in 1 year or sooner if needed.  Kristine Rangel is scheduled for an elective ARTHROPLASTY, HIP, TOTAL,POSTERIOR APPROACH (Left: Hip) on 08/02/2023 with Dr. Norleen Maltos, MD.  Given patient's past medical history significant for cardiovascular diagnoses, presurgical cardiac clearance was sought by the PAT team.  Per cardiology, according to the Revised Cardiac Risk Index (RCRI), her Perioperative Risk of Major Cardiac Event is (%): 0.9. Her Functional Capacity in METs is: 4.4 according to the Duke Activity Status Index (DASI). Therefore, based on ACC/AHA guidelines, patient would be at ACCEPTABLE risk for the planned procedure without further cardiovascular testing.  In review of her medication reconciliation, the  patient is not noted to be taking any type of anticoagulation or antiplatelet therapies that would need to be held during her perioperative course.  Patient denies previous perioperative complications with anesthesia in the past. In review of the available records, it is noted that patient underwent a general neuraxial course here at [facility] (ASA III) in 02/2024 without documented complications.      06/26/2024   10:17 AM 03/03/2024    8:07 AM 03/03/2024    4:24 AM  Vitals with BMI  Height 5' 3    Weight 270 lbs 12 oz    BMI 47.97    Systolic 99 141 103  Diastolic 77 106 48  Pulse 75 85 77    Providers/Specialists:   NOTE: Primary physician provider listed below. Patient may have been seen by APP or partner within same practice.   PROVIDER ROLE / SPECIALTY LAST OV  Poggi, Norleen PARAS, MD Orthopedics (Surgeon) 05/28/2024  Buren Rock HERO, MD Primary Care Provider ???  End, Lonni, MD Cardiology 07/27/2023; update call with preop APP 06/23/2024  Maree Hila, MD Neurology 03/12/2023   Allergies:  Bee venom and Penicillins  Current Home Medications:   No current facility-administered medications for this encounter.    acetaminophen  (TYLENOL ) 500 MG tablet   albuterol  (PROVENTIL ) (2.5 MG/3ML) 0.083% nebulizer solution   albuterol  (VENTOLIN  HFA) 108 (90 Base) MCG/ACT inhaler   atorvastatin  (LIPITOR ) 80 MG tablet   brimonidine -timolol  (COMBIGAN ) 0.2-0.5 % ophthalmic solution   carvedilol  (COREG ) 6.25 MG tablet   cetirizine (ZYRTEC) 10 MG tablet   DULoxetine  (CYMBALTA ) 60 MG capsule   empagliflozin  (JARDIANCE ) 25 MG TABS tablet   EPINEPHrine  0.3 mg/0.3 mL IJ SOAJ injection   esomeprazole (NEXIUM) 40 MG capsule   fluticasone  (FLONASE ) 50 MCG/ACT nasal spray   furosemide  (LASIX ) 40 MG tablet   gabapentin  (NEURONTIN ) 300 MG capsule   lidocaine  (LIDODERM ) 5 %   mirabegron  ER (MYRBETRIQ ) 50  MG TB24 tablet   nitroGLYCERIN  (NITROSTAT ) 0.4 MG SL  tablet   Semaglutide,0.25 or 0.5MG /DOS, (OZEMPIC, 0.25 OR 0.5 MG/DOSE,) 2 MG/3ML SOPN   Suvorexant  (BELSOMRA ) 20 MG TABS   SYMBICORT 160-4.5 MCG/ACT inhaler   tiZANidine  (ZANAFLEX ) 4 MG tablet   topiramate  (TOPAMAX ) 50 MG tablet   traMADol  (ULTRAM ) 50 MG tablet   isosorbide  mononitrate (IMDUR ) 60 MG 24 hr tablet   Melatonin 3 MG CAPS   ondansetron  (ZOFRAN ) 4 MG tablet   Turmeric (QC TUMERIC COMPLEX PO)   History:   Past Medical History:  Diagnosis Date   (HFpEF) heart failure with preserved ejection fraction (HCC)    a.) TTE 03/03/2021: EF 60-65%, no RWMAs, mild LA dil, RVSP 32.6, G2DD; b.) TTE 05/17/2022: EF 60-65%, no RWMAs, normal RVSF, G1DD   Anemia    when she was younger   Anxiety    Arthritis    Asthma    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   Bladder leak    Cardiac murmur    Chest pain    a.) 10/07/2008 Neg Dobuatmine Echo; b.) LHC 09/11/2011 - minor lum irregs; no sig CAD; c.) MV 06/02/2015: no ischemia; d.) cCTA 02/2021: Ca2+ = 0. Normal coronaries   CKD (chronic kidney disease), stage III (HCC)    COPD (chronic obstructive pulmonary disease) (HCC)    Depression    Diet-controlled type 2 diabetes mellitus (HCC)    Dyspnea    Essential hypertension    GERD (gastroesophageal reflux disease)    Glaucoma    History of kidney stones    History of left heart catheterization (LHC) 09/11/2011   a.) LHC 09/11/2011: minor luminal irregulaties mLAD, mLCx, mRCA - med mgmt.   Hyperlipidemia    Insomnia    a.) takes suvorexant    MI (myocardial infarction) (HCC)    a.) in the 1990s; details unclear   Migraine with aura    Neuropathy    On apixaban  therapy    OSA on CPAP    Primary osteoarthritis of left hip    Rotator cuff injury    Seasonal allergies     Stroke [facility])    TIA   Tendinitis of right rotator cuff    TIA (transient ischemic attack)    Past Surgical History:  Procedure Laterality Date   BRAIN SURGERY Right 2010   anuerysm repair   CARPAL TUNNEL RELEASE Right 06/12/2017   Procedure: CARPAL TUNNEL RELEASE;  Surgeon: Marchia Drivers, MD;  Location: [facility] ORS;  Service: Orthopedics;  Laterality: Right;   CHOLECYSTECTOMY     COLONOSCOPY WITH PROPOFOL  N/A 08/09/2021   Procedure: COLONOSCOPY WITH PROPOFOL ;  Surgeon: Maryruth Ole DASEN, MD;  Location: [facility] ENDOSCOPY;  Service: Endoscopy;  Laterality: N/A;   ESOPHAGOGASTRODUODENOSCOPY (EGD) WITH PROPOFOL  N/A 08/09/2021   Procedure: ESOPHAGOGASTRODUODENOSCOPY (EGD) WITH PROPOFOL ;  Surgeon: Maryruth Ole DASEN, MD;  Location: [facility] END[facility];  Service: Endoscopy;  Laterality: N/A;   LEFT HEART CATH AND CORONARY ANGIOGRAPHY Left 09/11/2011   Procedure: LEFT HEART CATH AND CORONARY ANGIOGRAPHY; Location: [facility]; Surgeon: Denyse Bathe, MD   REVERSE SHOULDER ARTHROPLASTY Right 08/02/2023   Procedure: REVERSE SHOULDER ARTHROPLASTY;  Surgeon: Edie Norleen PARAS, MD;  Location: [facility] ORS;  Service: Orthopedics;  Laterality: Right;   SHOULDER ARTHROSCOPY WITH OPEN ROTATOR CUFF REPAIR Left 11/23/2016   Procedure: SHOULDER  ARTHROSCOPY WITH OPEN ROTATOR CUFF REPAIR, ARTHROSCOPIC SUBACROMIAL DECOMPRESSION, DISTAL CLAVICLE EXCISION;  Surgeon: Franky Cranker, MD;  Location: [facility] ORS;  Service: Orthopedics;  Laterality: Left;   SHOULDER ARTHROSCOPY WITH OPEN ROTATOR CUFF REPAIR Right 07/30/2018   Procedure: SHOULDER ARTHROSCOPY WITH OPEN ROTATOR CUFF REPAIR,SUBACROMINAL DECOMPRESSION AND DISTAL CLAVICLE EXCISION;  Surgeon: Cranker Franky, MD;  Location: [facility] ORS;  Service: Orthopedics;  Laterality: Right;   TONSILLECTOMY     TOTAL HIP ARTHROPLASTY Right 02/21/2024   Procedure: ARTHROPLASTY, HIP, TOTAL,POSTERIOR APPROACH;  Surgeon: Edie Norleen PARAS, MD;  Location: [facility] ORS;  Service: Orthopedics;  Laterality: Right;    TOTAL KNEE ARTHROPLASTY Bilateral    TOTAL KNEE REVISION Right 05/05/2020   Procedure: right total knee arthroplasty revision;  Surgeon: Leora Lynwood SAUNDERS, MD;  Location: [facility] ORS;  Service: Orthopedics;  Laterality: Right;   Family History  Problem Relation Age of Onset   Breast cancer Maternal Aunt    Diabetes Mother    Heart attack Paternal Grandfather    Stroke Paternal Grandfather    Social History   Tobacco Use   Smoking status: Former    Types: Cigarettes   Smokeless tobacco: Current    Types: Snuff  Vaping Use   Vaping status: Never Used  Substance Use Topics   Alcohol use: Not Currently    Comment: rarely wine   Drug use: No    Pertinent Clinical Results:  LABS:   No visits with results within 3 Day(s) from this visit.  Latest known visit with results is:  Hospital Outpatient Visit on 06/26/2024  Component Date Value Ref Range Status   MRSA, PCR 06/26/2024 NEGATIVE  NEGATIVE Final   Staphylococcus aureus 06/26/2024 POSITIVE (A)  NEGATIVE Final   Comment: (NOTE) The Xpert SA Assay (FDA approved for NASAL specimens in patients 62 years of age and older), is one component of a comprehensive surveillance program. It is not intended to diagnose infection nor to guide or monitor treatment. Performed at [facility], [address] [place], [place] [address]    ABO/RH(D) 06/26/2024 O POS   Final   Antibody Screen 06/26/2024 NEG   Final   Sample Expiration 06/26/2024 07/10/2024,2359   Final   Extend sample reason 06/26/2024    Final                   Value:NO TRANSFUSIONS OR PREGNANCY IN THE PAST 3 MONTHS Performed at [facility], [address] [place], [place] [address]    WBC 06/26/2024 4.2  4.0 - 10.5 K/uL Final   RBC 06/26/2024 4.53  3.87 - 5.11 MIL/uL Final   Hemoglobin 06/26/2024 11.8 (L)  12.0 - 15.0 g/dL Final   HCT 91/85/7974 38.0  36.0 - 46.0 % Final   MCV 06/26/2024 83.9  80.0 - 100.0 fL Final   MCH 06/26/2024 26.0  26.0 -  34.0 pg Final   MCHC 06/26/2024 31.1  30.0 - 36.0 g/dL Final   RDW 91/85/7974 15.9 (H)  11.5 - 15.5 % Final   Platelets 06/26/2024 348  150 - 400 K/uL Final   nRBC 06/26/2024 0.0  0.0 - 0.2 % Final   Neutrophils Relative % 06/26/2024 37  % Final   Neutro Abs 06/26/2024 1.6 (L)  1.7 - 7.7 K/uL Final   Lymphocytes Relative 06/26/2024 42  % Final   Lymphs Abs 06/26/2024 1.8  0.7 - 4.0 K/uL Final   Monocytes Relative 06/26/2024 10  % Final   Monocytes Absolute 06/26/2024 0.4  0.1 -  1.0 K/uL Final   Eosinophils Relative 06/26/2024 10  % Final   Eosinophils Absolute 06/26/2024 0.4  0.0 - 0.5 K/uL Final   Basophils Relative 06/26/2024 1  % Final   Basophils Absolute 06/26/2024 0.0  0.0 - 0.1 K/uL Final   Immature Granulocytes 06/26/2024 0  % Final   Abs Immature Granulocytes 06/26/2024 0.00  0.00 - 0.07 K/uL Final   Performed at [facility], [address] [place], [place] [address]   Sodium 06/26/2024 140  135 - 145 mmol/L Final   Potassium 06/26/2024 3.8  3.5 - 5.1 mmol/L Final   Chloride 06/26/2024 104  98 - 111 mmol/L Final   CO2 06/26/2024 26  22 - 32 mmol/L Final   Glucose, Bld 06/26/2024 91  70 - 99 mg/dL Final   Glucose reference range applies only to samples taken after fasting for at least 8 hours.   BUN 06/26/2024 13  8 - 23 mg/dL Final   Creatinine, Ser 06/26/2024 1.08 (H)  0.44 - 1.00 mg/dL Final   Calcium  06/26/2024 9.9  8.9 - 10.3 mg/dL Final   Total Protein 91/85/7974 6.7  6.5 - 8.1 g/dL Final   Albumin 91/85/7974 3.5  3.5 - 5.0 g/dL Final   AST 91/85/7974 18  15 - 41 U/L Final   ALT 06/26/2024 11  0 - 44 U/L Final   Alkaline Phosphatase 06/26/2024 101  38 - 126 U/L Final   Total Bilirubin 06/26/2024 0.6  0.0 - 1.2 mg/dL Final   GFR, Estimated 06/26/2024 55 (L)  >60 mL/min Final   Comment: (NOTE) Calculated using the CKD-EPI Creatinine Equation (2021)    Anion gap 06/26/2024 10  5 - 15 Final   Performed at [facility], [address]  [place], [place] [address]   Color, Urine 06/26/2024 YELLOW (A)  YELLOW Final   APPearance 06/26/2024 CLEAR (A)  CLEAR Final   Specific Gravity, Urine 06/26/2024 1.020  1.005 - 1.030 Final   pH 06/26/2024 5.0  5.0 - 8.0 Final   Glucose, UA 06/26/2024 >=500 (A)  NEGATIVE mg/dL Final   Hgb urine dipstick 06/26/2024 NEGATIVE  NEGATIVE Final   Bilirubin Urine 06/26/2024 NEGATIVE  NEGATIVE Final   Ketones, ur 06/26/2024 NEGATIVE  NEGATIVE mg/dL Final   Protein, ur 91/85/7974 NEGATIVE  NEGATIVE mg/dL Final   Nitrite 91/85/7974 NEGATIVE  NEGATIVE Final   Leukocytes,Ua 06/26/2024 NEGATIVE  NEGATIVE Final   RBC / HPF 06/26/2024 0-5  0 - 5 RBC/hpf Final   WBC, UA 06/26/2024 0-5  0 - 5 WBC/hpf Final   Bacteria, UA 06/26/2024 RARE (A)  NONE SEEN Final   Squamous Epithelial / HPF 06/26/2024 6-10  0 - 5 /HPF Final   Mucus 06/26/2024 PRESENT   Final   Hyaline Casts, UA 06/26/2024 PRESENT   Final   Performed at [facility], [address] [place], [place] [address]    ECG: Date: 004/06/2024 Time ECG obtained: 1100 AM Rate: 66 bpm Rhythm: normal sinus Axis (leads I and aVF): Normal Intervals: PR 158 ms. QRS 72 ms. QTc 404 ms. ST segment and T wave changes: No evidence of acute ST segment elevation or depression.   Comparison: Similar to previous tracing obtained on 07/27/2023   IMAGING / PROCEDURES: X-RAY HIP LEFT 2 OR 3 VIEWS WITH OR WITHOUT PELVIS  performed on 05/28/2024 Moderate to severe degenerative changes of the left hip with loss of both superior and inferior aspect of acetabular joint space. Osteophyte formation off the inferior  aspect of the femoral head.  No evidence of avascular process.  No evidence of acute fracture or evidence of dislocation.   US  CAROTID BILATERAL performed on 06/15/2022 Color duplex indicates minimal heterogeneous plaque, with no hemodynamically significant stenosis by duplex criteria in the extracranial cerebrovascular circulation.  TRANSTHORACIC  ECHOCARDIOGRAM performed on 05/17/2022 Left ventricular ejection fraction, by estimation, is 60 to 65%. The left ventricle has normal function. The left ventricle has no regional wall motion abnormalities. Left ventricular diastolic parameters are consistent with Grade I diastolic dysfunction (impaired relaxation). Right ventricular systolic function is normal. The right ventricular size is normal.  The mitral valve is normal in structure. No evidence of mitral valve regurgitation.  The aortic valve was not well visualized. Aortic valve regurgitation is not visualized.   CT CORONARY MORPH W/CTA COR W/SCORE W/CA W/CM &/OR WO/CM performed on 02/24/2021 No significant extracardiac findings Normal coronary calcium  score of 0. Patient is low risk for coronary events. Normal coronary origin with right dominance. No evidence of CAD. CAD-RADS 0. Consider non-atherosclerotic causes of chest pain.  Impression and Plan:  Kristine Rangel has been referred for pre-anesthesia review and clearance prior to her undergoing the planned anesthetic and procedural courses. Available labs, pertinent testing, and imaging results were personally reviewed by me in preparation for upcoming operative/procedural course. [facility] medical record has been updated following extensive record review and patient interview with PAT staff.   This patient has been appropriately cleared by cardiology with an overall ACCEPTABLE risk of experiencing significant perioperative cardiovascular complications. Based on clinical review performed today (07/02/24), barring any significant acute changes in the patient's overall condition, it is anticipated that she will be able to proceed with the planned surgical intervention. Any acute changes in clinical condition may necessitate her procedure being postponed and/or cancelled. Patient will meet with anesthesia team (MD and/or CRNA) on the day of her procedure for preoperative  evaluation/assessment. Questions regarding anesthetic course will be fielded at that time.   Pre-surgical instructions were reviewed with the patient during her PAT appointment, and questions were fielded to satisfaction by PAT clinical staff. She has been instructed on which medications that she will need to hold prior to surgery, as well as the ones that have been deemed safe/appropriate to take on the day of her procedure. As part of the general education provided by PAT, patient made aware both verbally and in writing, that she would need to abstain from the use of any illegal substances during her perioperative course.  She was advised that failure to follow the provided instructions could necessitate case cancellation or result in serious perioperative complications up to and including death. Patient encouraged to contact PAT and/or her surgeon's office to discuss any questions or concerns that may arise prior to surgery; verbalized understanding.   Dorise Pereyra, MSN, APRN, FNP-C, CEN [facility]  Perioperative Services Nurse Practitioner Phone: [phone] Fax: [phone] 07/02/24 9:12 AM  NOTE: This note has been prepared using Dragon dictation software. Despite my best ability to proofread, there is always the potential that unintentional transcriptional errors may still occur from this process.

## 2024-07-03 ENCOUNTER — Ambulatory Visit

## 2024-07-03 ENCOUNTER — Other Ambulatory Visit: Payer: Self-pay

## 2024-07-03 ENCOUNTER — Ambulatory Visit: Admitting: Urgent Care

## 2024-07-03 ENCOUNTER — Observation Stay
Admission: RE | Admit: 2024-07-03 | Discharge: 2024-07-07 | Disposition: A | Discharge status: Home / Self Care | Attending: Surgery | Admitting: Surgery

## 2024-07-03 ENCOUNTER — Encounter
Admission: RE | Disposition: A | Discharge status: Home / Self Care | Payer: Self-pay | Source: Home / Self Care | Attending: Surgery

## 2024-07-03 ENCOUNTER — Encounter: Payer: Self-pay | Admitting: Surgery

## 2024-07-03 ENCOUNTER — Ambulatory Visit: Payer: Self-pay | Admitting: Urgent Care

## 2024-07-03 DIAGNOSIS — M1612 Unilateral primary osteoarthritis, left hip: Secondary | ICD-10-CM | POA: Diagnosis present

## 2024-07-03 DIAGNOSIS — Z96642 Presence of left artificial hip joint: Principal | ICD-10-CM | POA: Insufficient documentation

## 2024-07-03 DIAGNOSIS — N183 Chronic kidney disease, stage 3 unspecified: Secondary | ICD-10-CM | POA: Insufficient documentation

## 2024-07-03 DIAGNOSIS — I130 Hypertensive heart and chronic kidney disease with heart failure and stage 1 through stage 4 chronic kidney disease, or unspecified chronic kidney disease: Secondary | ICD-10-CM | POA: Diagnosis not present

## 2024-07-03 DIAGNOSIS — J449 Chronic obstructive pulmonary disease, unspecified: Secondary | ICD-10-CM | POA: Diagnosis not present

## 2024-07-03 DIAGNOSIS — E1122 Type 2 diabetes mellitus with diabetic chronic kidney disease: Secondary | ICD-10-CM | POA: Diagnosis not present

## 2024-07-03 DIAGNOSIS — I5032 Chronic diastolic (congestive) heart failure: Secondary | ICD-10-CM | POA: Insufficient documentation

## 2024-07-03 DIAGNOSIS — Z01812 Encounter for preprocedural laboratory examination: Secondary | ICD-10-CM

## 2024-07-03 DIAGNOSIS — E119 Type 2 diabetes mellitus without complications: Secondary | ICD-10-CM

## 2024-07-03 HISTORY — DX: Long term (current) use of anticoagulants: Z79.01

## 2024-07-03 LAB — GLUCOSE, CAPILLARY
Glucose-Capillary: 84 mg/dL (ref 70–99)
Glucose-Capillary: 100 mg/dL — ABNORMAL HIGH (ref 70–99)
Glucose-Capillary: 78 mg/dL (ref 70–99)
Glucose-Capillary: 132 mg/dL — ABNORMAL HIGH (ref 70–99)
Glucose-Capillary: 121 mg/dL — ABNORMAL HIGH (ref 70–99)

## 2024-07-03 LAB — HEMOGLOBIN A1C
Hgb A1c MFr Bld: 5.6 % (ref 4.8–5.6)
Mean Plasma Glucose: 114.02 mg/dL

## 2024-07-03 SURGERY — ARTHROPLASTY, HIP, TOTAL,POSTERIOR APPROACH
Anesthesia: Spinal | Site: Hip | Laterality: Left

## 2024-07-03 MED ORDER — LORATADINE 10 MG PO TABS
10.0000 mg | ORAL_TABLET | Freq: Every day | ORAL | Status: DC
  Administered 2024-07-03 – 2024-07-07 (×5): 10 mg via ORAL
  Filled 2024-07-03 (×5): qty 1

## 2024-07-03 MED ORDER — OXYCODONE HCL 5 MG PO TABS
5.0000 mg | ORAL_TABLET | ORAL | Status: DC | PRN
  Administered 2024-07-03 – 2024-07-05 (×5): 5 mg via ORAL
  Administered 2024-07-05 – 2024-07-06 (×4): 10 mg via ORAL
  Administered 2024-07-06 – 2024-07-07 (×4): 5 mg via ORAL
  Filled 2024-07-03: qty 1
  Filled 2024-07-03: qty 2
  Filled 2024-07-03: qty 1
  Filled 2024-07-03: qty 2
  Filled 2024-07-03 (×5): qty 1
  Filled 2024-07-03: qty 2
  Filled 2024-07-03: qty 1
  Filled 2024-07-03: qty 2
  Filled 2024-07-03: qty 1

## 2024-07-03 MED ORDER — TIMOLOL MALEATE 0.5 % OP SOLN
1.0000 [drp] | Freq: Two times a day (BID) | OPHTHALMIC | Status: DC
  Administered 2024-07-03 – 2024-07-07 (×8): 1 [drp] via OPHTHALMIC
  Filled 2024-07-03: qty 5

## 2024-07-03 MED ORDER — SODIUM CHLORIDE 0.9 % IV SOLN: INTRAVENOUS | Status: DC

## 2024-07-03 MED ORDER — MIDAZOLAM HCL 5 MG/5ML IJ SOLN
INTRAMUSCULAR | Status: DC | PRN
  Administered 2024-07-03: 2 mg via INTRAVENOUS

## 2024-07-03 MED ORDER — FENTANYL CITRATE (PF) 100 MCG/2ML IJ SOLN
INTRAMUSCULAR | Status: DC | PRN
  Administered 2024-07-03 (×4): 25 ug via INTRAVENOUS

## 2024-07-03 MED ORDER — INSULIN ASPART 100 UNIT/ML IJ SOLN
0.0000 [IU] | Freq: Three times a day (TID) | INTRAMUSCULAR | Status: DC
  Administered 2024-07-03 – 2024-07-04 (×3): 3 [IU] via SUBCUTANEOUS
  Filled 2024-07-03 (×3): qty 1

## 2024-07-03 MED ORDER — CHLORHEXIDINE GLUCONATE 0.12 % MT SOLN
OROMUCOSAL | Status: AC
  Filled 2024-07-03: qty 15

## 2024-07-03 MED ORDER — MIRABEGRON ER 50 MG PO TB24
50.0000 mg | ORAL_TABLET | Freq: Every day | ORAL | Status: DC
  Administered 2024-07-04 – 2024-07-07 (×4): 50 mg via ORAL
  Filled 2024-07-03 (×4): qty 1

## 2024-07-03 MED ORDER — APIXABAN 2.5 MG PO TABS
2.5000 mg | ORAL_TABLET | Freq: Two times a day (BID) | ORAL | Status: DC
  Administered 2024-07-04 – 2024-07-07 (×7): 2.5 mg via ORAL
  Filled 2024-07-03 (×7): qty 1

## 2024-07-03 MED ORDER — CHLORHEXIDINE GLUCONATE 0.12 % MT SOLN
15.0000 mL | Freq: Once | OROMUCOSAL | Status: AC
  Administered 2024-07-03: 15 mL via OROMUCOSAL

## 2024-07-03 MED ORDER — ONDANSETRON HCL 4 MG PO TABS: 4.0000 mg | ORAL_TABLET | Freq: Four times a day (QID) | ORAL | Status: DC | PRN

## 2024-07-03 MED ORDER — MIDAZOLAM HCL 2 MG/2ML IJ SOLN
INTRAMUSCULAR | Status: AC
  Filled 2024-07-03: qty 2

## 2024-07-03 MED ORDER — SODIUM CHLORIDE (PF) 0.9 % IJ SOLN
INTRAMUSCULAR | Status: AC
  Filled 2024-07-03: qty 40

## 2024-07-03 MED ORDER — METOCLOPRAMIDE HCL 5 MG/ML IJ SOLN: 5.0000 mg | Freq: Three times a day (TID) | INTRAMUSCULAR | Status: DC | PRN

## 2024-07-03 MED ORDER — TRIAMCINOLONE ACETONIDE 40 MG/ML IJ SUSP
INTRAMUSCULAR | Status: DC | PRN
  Administered 2024-07-03: 93 mL

## 2024-07-03 MED ORDER — FENTANYL CITRATE (PF) 100 MCG/2ML IJ SOLN
INTRAMUSCULAR | Status: AC
  Filled 2024-07-03: qty 2

## 2024-07-03 MED ORDER — CEFAZOLIN SODIUM-DEXTROSE 2-4 GM/100ML-% IV SOLN
2.0000 g | INTRAVENOUS | Status: AC
  Administered 2024-07-03: 3 g via INTRAVENOUS

## 2024-07-03 MED ORDER — BUPIVACAINE-EPINEPHRINE (PF) 0.5% -1:200000 IJ SOLN
INTRAMUSCULAR | Status: AC
  Filled 2024-07-03: qty 30

## 2024-07-03 MED ORDER — DULOXETINE HCL 30 MG PO CPEP
60.0000 mg | ORAL_CAPSULE | Freq: Every day | ORAL | Status: DC
Start: 2024-07-03 — End: 2024-07-07
  Administered 2024-07-03 – 2024-07-06 (×4): 60 mg via ORAL
  Filled 2024-07-03 (×4): qty 2

## 2024-07-03 MED ORDER — CHLORHEXIDINE GLUCONATE 4 % EX SOLN: 1.0000 | CUTANEOUS | 1 refills | Status: AC

## 2024-07-03 MED ORDER — DOCUSATE SODIUM 100 MG PO CAPS
100.0000 mg | ORAL_CAPSULE | Freq: Two times a day (BID) | ORAL | Status: DC
  Administered 2024-07-03 – 2024-07-07 (×9): 100 mg via ORAL
  Filled 2024-07-03 (×9): qty 1

## 2024-07-03 MED ORDER — DIPHENHYDRAMINE HCL 12.5 MG/5ML PO ELIX: 12.5000 mg | ORAL_SOLUTION | ORAL | Status: DC | PRN

## 2024-07-03 MED ORDER — CEFAZOLIN SODIUM-DEXTROSE 3-4 GM/150ML-% IV SOLN
3.0000 g | Freq: Four times a day (QID) | INTRAVENOUS | Status: AC
  Administered 2024-07-03 (×2): 3 g via INTRAVENOUS
  Filled 2024-07-03 (×2): qty 150

## 2024-07-03 MED ORDER — ACETAMINOPHEN 10 MG/ML IV SOLN
INTRAVENOUS | Status: AC
  Filled 2024-07-03: qty 100

## 2024-07-03 MED ORDER — KETOROLAC TROMETHAMINE 15 MG/ML IJ SOLN
INTRAMUSCULAR | Status: AC
  Filled 2024-07-03: qty 1

## 2024-07-03 MED ORDER — METOCLOPRAMIDE HCL 5 MG PO TABS: 5.0000 mg | ORAL_TABLET | Freq: Three times a day (TID) | ORAL | Status: DC | PRN

## 2024-07-03 MED ORDER — ACETAMINOPHEN 10 MG/ML IV SOLN
INTRAVENOUS | Status: DC | PRN
  Administered 2024-07-03: 1000 mg via INTRAVENOUS

## 2024-07-03 MED ORDER — BUPIVACAINE HCL (PF) 0.5 % IJ SOLN
INTRAMUSCULAR | Status: DC | PRN
Start: 2024-07-03 — End: 2024-07-03
  Administered 2024-07-03: 2.6 mL

## 2024-07-03 MED ORDER — ATORVASTATIN CALCIUM 80 MG PO TABS
80.0000 mg | ORAL_TABLET | Freq: Every day | ORAL | Status: DC
  Administered 2024-07-03 – 2024-07-06 (×4): 80 mg via ORAL
  Filled 2024-07-03 (×4): qty 1

## 2024-07-03 MED ORDER — BRIMONIDINE TARTRATE-TIMOLOL 0.2-0.5 % OP SOLN
1.0000 [drp] | Freq: Two times a day (BID) | OPHTHALMIC | Status: DC
  Filled 2024-07-03: qty 5

## 2024-07-03 MED ORDER — PANTOPRAZOLE SODIUM 40 MG PO TBEC
80.0000 mg | DELAYED_RELEASE_TABLET | Freq: Every day | ORAL | Status: DC
  Administered 2024-07-04 – 2024-07-07 (×4): 80 mg via ORAL
  Filled 2024-07-03 (×4): qty 2

## 2024-07-03 MED ORDER — ONDANSETRON HCL 4 MG/2ML IJ SOLN: 4.0000 mg | Freq: Four times a day (QID) | INTRAMUSCULAR | Status: DC | PRN

## 2024-07-03 MED ORDER — FENTANYL CITRATE (PF) 100 MCG/2ML IJ SOLN: 25.0000 ug | INTRAMUSCULAR | Status: DC | PRN

## 2024-07-03 MED ORDER — SUVOREXANT 20 MG PO TABS
20.0000 mg | ORAL_TABLET | Freq: Every day | ORAL | Status: DC
  Filled 2024-07-03 (×4): qty 1

## 2024-07-03 MED ORDER — ORAL CARE MOUTH RINSE: 15.0000 mL | Freq: Once | OROMUCOSAL | Status: AC

## 2024-07-03 MED ORDER — ALBUTEROL SULFATE HFA 108 (90 BASE) MCG/ACT IN AERS: 1.0000 | INHALATION_SPRAY | Freq: Four times a day (QID) | RESPIRATORY_TRACT | Status: DC | PRN

## 2024-07-03 MED ORDER — TRIAMCINOLONE ACETONIDE 40 MG/ML IJ SUSP
INTRAMUSCULAR | Status: AC
  Filled 2024-07-03: qty 1

## 2024-07-03 MED ORDER — EPHEDRINE SULFATE-NACL 50-0.9 MG/10ML-% IV SOSY
PREFILLED_SYRINGE | INTRAVENOUS | Status: DC | PRN
Start: 2024-07-03 — End: 2024-07-03
  Administered 2024-07-03 (×2): 5 mg via INTRAVENOUS

## 2024-07-03 MED ORDER — PHENYLEPHRINE HCL-NACL 20-0.9 MG/250ML-% IV SOLN
INTRAVENOUS | Status: DC | PRN
  Administered 2024-07-03 (×2): 80 ug via INTRAVENOUS

## 2024-07-03 MED ORDER — ONDANSETRON HCL 4 MG/2ML IJ SOLN
INTRAMUSCULAR | Status: AC
  Filled 2024-07-03: qty 2

## 2024-07-03 MED ORDER — CEFAZOLIN SODIUM-DEXTROSE 2-4 GM/100ML-% IV SOLN
INTRAVENOUS | Status: AC
Start: 2024-07-03 — End: 2024-07-03
  Filled 2024-07-03: qty 100

## 2024-07-03 MED ORDER — PROPOFOL 500 MG/50ML IV EMUL
INTRAVENOUS | Status: DC | PRN
  Administered 2024-07-03: 40 ug/kg/min via INTRAVENOUS

## 2024-07-03 MED ORDER — BISACODYL 10 MG RE SUPP: 10.0000 mg | Freq: Every day | RECTAL | Status: DC | PRN

## 2024-07-03 MED ORDER — ALBUTEROL SULFATE (2.5 MG/3ML) 0.083% IN NEBU: 2.5000 mg | INHALATION_SOLUTION | Freq: Four times a day (QID) | RESPIRATORY_TRACT | Status: DC | PRN

## 2024-07-03 MED ORDER — FLUTICASONE FUROATE-VILANTEROL 200-25 MCG/ACT IN AEPB
1.0000 | INHALATION_SPRAY | Freq: Every day | RESPIRATORY_TRACT | Status: DC
  Administered 2024-07-04 – 2024-07-07 (×4): 1 via RESPIRATORY_TRACT
  Filled 2024-07-03: qty 28

## 2024-07-03 MED ORDER — FLUTICASONE PROPIONATE 50 MCG/ACT NA SUSP
1.0000 | Freq: Every day | NASAL | Status: DC
  Administered 2024-07-03 – 2024-07-07 (×5): 1 via NASAL
  Filled 2024-07-03: qty 16

## 2024-07-03 MED ORDER — MELATONIN 5 MG PO TABS: 5.0000 mg | ORAL_TABLET | Freq: Every evening | ORAL | Status: DC | PRN

## 2024-07-03 MED ORDER — FLEET ENEMA RE ENEM
1.0000 | ENEMA | Freq: Once | RECTAL | Status: DC | PRN
Start: 2024-07-03 — End: 2024-07-07

## 2024-07-03 MED ORDER — TRANEXAMIC ACID-NACL 1000-0.7 MG/100ML-% IV SOLN
INTRAVENOUS | Status: AC
  Filled 2024-07-03: qty 100

## 2024-07-03 MED ORDER — CARVEDILOL 3.125 MG PO TABS
6.2500 mg | ORAL_TABLET | Freq: Two times a day (BID) | ORAL | Status: DC
  Administered 2024-07-03 – 2024-07-07 (×6): 6.25 mg via ORAL
  Filled 2024-07-03 (×7): qty 2

## 2024-07-03 MED ORDER — BUPIVACAINE LIPOSOME 1.3 % IJ SUSP
INTRAMUSCULAR | Status: AC
Start: 2024-07-03 — End: 2024-07-03
  Filled 2024-07-03: qty 20

## 2024-07-03 MED ORDER — ACETAMINOPHEN 500 MG PO TABS
1000.0000 mg | ORAL_TABLET | Freq: Four times a day (QID) | ORAL | Status: AC
  Administered 2024-07-03 – 2024-07-04 (×4): 1000 mg via ORAL
  Filled 2024-07-03 (×4): qty 2

## 2024-07-03 MED ORDER — TIZANIDINE HCL 4 MG PO TABS
4.0000 mg | ORAL_TABLET | Freq: Three times a day (TID) | ORAL | Status: DC
  Administered 2024-07-03 – 2024-07-07 (×13): 4 mg via ORAL
  Filled 2024-07-03 (×14): qty 1

## 2024-07-03 MED ORDER — PROPOFOL 10 MG/ML IV BOLUS
INTRAVENOUS | Status: DC | PRN
  Administered 2024-07-03: 10 mg via INTRAVENOUS

## 2024-07-03 MED ORDER — TRANEXAMIC ACID-NACL 1000-0.7 MG/100ML-% IV SOLN
1000.0000 mg | INTRAVENOUS | Status: AC
  Administered 2024-07-03 (×2): 1000 mg via INTRAVENOUS

## 2024-07-03 MED ORDER — TOPIRAMATE 25 MG PO TABS
50.0000 mg | ORAL_TABLET | Freq: Every day | ORAL | Status: DC
  Administered 2024-07-03 – 2024-07-06 (×4): 50 mg via ORAL
  Filled 2024-07-03 (×4): qty 2

## 2024-07-03 MED ORDER — MUPIROCIN 2 % EX OINT: 1.0000 | TOPICAL_OINTMENT | Freq: Two times a day (BID) | CUTANEOUS | 0 refills | Status: AC

## 2024-07-03 MED ORDER — GABAPENTIN 300 MG PO CAPS
300.0000 mg | ORAL_CAPSULE | Freq: Three times a day (TID) | ORAL | Status: DC
  Administered 2024-07-03 – 2024-07-05 (×8): 300 mg via ORAL
  Administered 2024-07-06 – 2024-07-07 (×4): 600 mg via ORAL
  Administered 2024-07-07: 300 mg via ORAL
  Filled 2024-07-03: qty 2
  Filled 2024-07-03 (×5): qty 1
  Filled 2024-07-03: qty 2
  Filled 2024-07-03: qty 1
  Filled 2024-07-03 (×2): qty 2
  Filled 2024-07-03 (×2): qty 1
  Filled 2024-07-03: qty 2

## 2024-07-03 MED ORDER — PROPOFOL 1000 MG/100ML IV EMUL
INTRAVENOUS | Status: AC
  Filled 2024-07-03: qty 100

## 2024-07-03 MED ORDER — ONDANSETRON HCL 4 MG/2ML IJ SOLN
INTRAMUSCULAR | Status: DC | PRN
  Administered 2024-07-03: 4 mg via INTRAVENOUS

## 2024-07-03 MED ORDER — ACETAMINOPHEN 325 MG PO TABS
325.0000 mg | ORAL_TABLET | Freq: Four times a day (QID) | ORAL | Status: DC | PRN
  Administered 2024-07-06 – 2024-07-07 (×3): 650 mg via ORAL
  Filled 2024-07-03 (×3): qty 2

## 2024-07-03 MED ORDER — MAGNESIUM HYDROXIDE 400 MG/5ML PO SUSP: 30.0000 mL | Freq: Every day | ORAL | Status: DC | PRN

## 2024-07-03 MED ORDER — FUROSEMIDE 40 MG PO TABS
40.0000 mg | ORAL_TABLET | Freq: Two times a day (BID) | ORAL | Status: DC
  Administered 2024-07-03 – 2024-07-07 (×6): 40 mg via ORAL
  Filled 2024-07-03 (×7): qty 1

## 2024-07-03 MED ORDER — KETOROLAC TROMETHAMINE 15 MG/ML IJ SOLN
15.0000 mg | Freq: Once | INTRAMUSCULAR | Status: AC
  Administered 2024-07-03: 15 mg via INTRAVENOUS

## 2024-07-03 MED ORDER — SODIUM CHLORIDE 0.9 % IV SOLN: INTRAVENOUS | Status: AC

## 2024-07-03 MED ORDER — BRIMONIDINE TARTRATE 0.2 % OP SOLN
1.0000 [drp] | Freq: Two times a day (BID) | OPHTHALMIC | Status: DC
  Administered 2024-07-03 – 2024-07-07 (×9): 1 [drp] via OPHTHALMIC
  Filled 2024-07-03: qty 5

## 2024-07-03 MED ORDER — NITROGLYCERIN 0.4 MG SL SUBL: 0.4000 mg | SUBLINGUAL_TABLET | SUBLINGUAL | Status: DC | PRN

## 2024-07-03 MED ORDER — SODIUM CHLORIDE 0.9 % IR SOLN
Status: DC | PRN
Start: 2024-07-03 — End: 2024-07-03
  Administered 2024-07-03: 3000 mL

## 2024-07-03 MED ORDER — KETOROLAC TROMETHAMINE 15 MG/ML IJ SOLN
7.5000 mg | Freq: Four times a day (QID) | INTRAMUSCULAR | Status: AC
  Administered 2024-07-03 – 2024-07-04 (×3): 7.5 mg via INTRAVENOUS
  Filled 2024-07-03 (×4): qty 1

## 2024-07-03 MED ORDER — EMPAGLIFLOZIN 25 MG PO TABS
25.0000 mg | ORAL_TABLET | Freq: Every day | ORAL | Status: DC
  Administered 2024-07-04 – 2024-07-07 (×4): 25 mg via ORAL
  Filled 2024-07-03 (×4): qty 1

## 2024-07-03 SURGICAL SUPPLY — 50 items
BIT DRILL JC 5IN 2.4M 127 24FL (BIT) IMPLANT
BLADE SAGITTAL WIDE XTHICK NO (BLADE) ×1 IMPLANT
BLADE SURG SZ20 CARB STEEL (BLADE) ×1 IMPLANT
CHLORAPREP W/TINT 26 (MISCELLANEOUS) ×1 IMPLANT
DRAPE IMP U-DRAPE 54X76 (DRAPES) IMPLANT
DRAPE INCISE IOBAN 66X60 STRL (DRAPES) ×1 IMPLANT
DRAPE SHEET LG 3/4 BI-LAMINATE (DRAPES) ×1 IMPLANT
DRAPE SURG 17X11 SM STRL (DRAPES) ×2 IMPLANT
DRSG MEPILEX SACRM 8.7X9.8 (GAUZE/BANDAGES/DRESSINGS) ×1 IMPLANT
DRSG OPSITE POSTOP 4X10 (GAUZE/BANDAGES/DRESSINGS) ×1 IMPLANT
ELECT CAUTERY BLADE 6.4 (BLADE) ×1 IMPLANT
GAUZE 4X4 16PLY ~~LOC~~+RFID DBL (SPONGE) ×1 IMPLANT
GAUZE XEROFORM 1X8 LF (GAUZE/BANDAGES/DRESSINGS) ×1 IMPLANT
GLOVE BIO SURGEON STRL SZ7.5 (GLOVE) ×4 IMPLANT
GLOVE BIO SURGEON STRL SZ8 (GLOVE) ×4 IMPLANT
GLOVE BIOGEL PI IND STRL 8 (GLOVE) ×1 IMPLANT
GLOVE INDICATOR 8.0 STRL GRN (GLOVE) ×1 IMPLANT
GOWN STRL REUS W/ TWL LRG LVL3 (GOWN DISPOSABLE) ×1 IMPLANT
GOWN STRL REUS W/ TWL XL LVL3 (GOWN DISPOSABLE) ×1 IMPLANT
HEAD CERAMIC BIOLOX 32MM (Head) IMPLANT
HOLSTER ELECTROSUGICAL PENCIL (MISCELLANEOUS) ×1 IMPLANT
HOOD PEEL AWAY T7 (MISCELLANEOUS) ×3 IMPLANT
KIT PREVENA INCISION MGT20CM45 (CANNISTER) IMPLANT
KIT TURNOVER KIT A (KITS) ×1 IMPLANT
LINER ACE G7 32 SZC HIGH WALL (Liner) IMPLANT
MANIFOLD NEPTUNE II (INSTRUMENTS) ×1 IMPLANT
NDL FILTER BLUNT 18X1 1/2 (NEEDLE) ×1 IMPLANT
NDL SAFETY ECLIPSE 18X1.5 (NEEDLE) ×2 IMPLANT
NDL SPNL 20GX3.5 QUINCKE YW (NEEDLE) ×1 IMPLANT
NEEDLE FILTER BLUNT 18X1 1/2 (NEEDLE) ×1 IMPLANT
NEEDLE SPNL 20GX3.5 QUINCKE YW (NEEDLE) ×1 IMPLANT
PACK HIP PROSTHESIS (MISCELLANEOUS) ×1 IMPLANT
PENCIL SMOKE EVACUATOR (MISCELLANEOUS) ×1 IMPLANT
PIN STEIN SMOOTH 3/16X9 (Pin) ×3 IMPLANT
SHELL ACETAB HIP 3H 48MM C (Hips) IMPLANT
SLEEVE HIP BIOLOX -6MM OFFSET (Sleeve) IMPLANT
SOL .9 NS 3000ML IRR UROMATIC (IV SOLUTION) ×1 IMPLANT
SPONGE T-LAP 18X18 ~~LOC~~+RFID (SPONGE) IMPLANT
STAPLER SKIN PROX 35W (STAPLE) ×1 IMPLANT
STEM COLLARLESS FULL 11X135 (Stem) IMPLANT
SUT TICRON 2-0 30IN 311381 (SUTURE) ×3 IMPLANT
SUT VIC AB 0 CT1 36 (SUTURE) ×1 IMPLANT
SUT VIC AB 1 CT1 36 (SUTURE) ×1 IMPLANT
SUT VIC AB 2-0 CT1 (SUTURE) ×3 IMPLANT
SYR 10ML LL (SYRINGE) ×1 IMPLANT
SYR 20ML LL LF (SYRINGE) ×1 IMPLANT
SYR 30ML LL (SYRINGE) ×2 IMPLANT
TIP FAN IRRIG PULSAVAC PLUS (DISPOSABLE) ×1 IMPLANT
TRAP FLUID SMOKE EVACUATOR (MISCELLANEOUS) ×1 IMPLANT
WATER STERILE IRR 1000ML POUR (IV SOLUTION) ×1 IMPLANT

## 2024-07-03 NOTE — H&P (Signed)
 History of Present Illness: Kristine Rangel is a 68 y.o. female who presents today for her surgical history and physical for upcoming left total hip arthroplasty scheduled with Dr. Edie on 07/03/2024. The patient denies any changes in her medical history since she was last evaluated. She denies any falls or trauma affecting the left hip since her last appointment. Pain score in the left hip is a 9 out of 10 at today's visit. She is using a walker for assistance with ambulation. Pain is primarily on the lateral aspect of her left hip with radiation into the anterior groin. She does have a history of chronic low back pain as well and does report some burning and tingling but states that the pain around the anterior border is more severe in her lower back. She denies any personal history of heart attack or stroke. She does have a history of asthma. Denies any history of DVT. Denies any history of diabetes.  Past Medical History: History of chicken pox  Hyperlipidemia   Past Surgical History: COLONOSCOPY 08/09/2021 (Diverticulosis/Otherwise normal colon/PHx CP/Repeat 50yrs/CTL)  EGD 08/09/2021 (Reflux esophagitis/Repeat as needed/CTL)  Reverse right total shoulder arthroplasty with biceps tenodesis 08/02/2023 (Dr Edie)  ARTHROPLASTY HIP TOTAL Right 02/21/2024 (Dr. Edie)  ARTHROSCOPY SHOULDER Bilateral  CRANIOPLASTY FOR REPAIR SKULL DEFECT W/REPARATIVE BRAIN SURGERY  CRANIOTOMY FOR ANEURYSM REPAIR  JOINT REPLACEMENT Bilateral knees KNEE ARTHROSCOPY   Past Family History: High blood pressure (Hypertension) Maternal Grandmother   Medications: acetaminophen  (TYLENOL ) 500 MG tablet Take 2 tablets (1,000 mg total) by mouth every 6 (six) hours.  albuterol  (PROVENTIL ) 2.5 mg /3 mL (0.083 %) nebulizer solution Take 2.5 mg by nebulization every 6 (six) hours as needed for Wheezing  albuterol  MDI, PROVENTIL , VENTOLIN , PROAIR , HFA 90 mcg/actuation inhaler Inhale 2 inhalations into the lungs every 4  (four) hours as needed for Wheezing or Shortness of Breath  ALPRAZolam (XANAX) 0.5 MG tablet Take 0.5 mg by mouth once daily as needed for Anxiety  aspirin  81 MG chewable tablet Take 81 mg by mouth.  atorvastatin  (LIPITOR ) 80 MG tablet Take 80 mg by mouth once daily  BELSOMRA  15 mg Tab Take 1 tablet by mouth at bedtime  brimonidine -timolol  (COMBIGAN ) 0.2-0.5 % ophthalmic solution 1 drop every twelve (12) hours.  brinzolamide (AZOPT) 1 % ophthalmic suspension 1 drop Three (3) times a day.  budesonide-formoterol  (SYMBICORT) 160-4.5 mcg/actuation inhaler Inhale 2 inhalations into the lungs 2 (two) times daily  carvediloL  (COREG ) 6.25 MG tablet Take 1 tablet by mouth 2 (two) times daily  cetirizine (ZYRTEC) 10 mg capsule Take 10 mg by mouth once daily  DULoxetine  (CYMBALTA ) 60 MG DR capsule Take 60 mg by mouth once daily  EPINEPHrine  (EPIPEN ) 0.3 mg/0.3 mL pen injector Inject 0.3 mg subcutaneously.  esomeprazole (NEXIUM) 40 MG DR capsule Take 40 mg by mouth once daily  fluticasone  (FLONASE ) 50 mcg/actuation nasal spray Place 2 sprays into both nostrils once daily as needed for Rhinitis or Allergies  FUROsemide  (LASIX ) 40 MG tablet TAKE ONE TABLET BY MOUTH TWICE DAILY 60 tablet 0  gabapentin  (NEURONTIN ) 300 MG capsule  isosorbide  mononitrate (IMDUR ) 60 MG ER tablet Take 60 mg by mouth once daily  JARDIANCE  25 mg tablet Take 25 mg by mouth once daily  lidocaine  (LIDODERM ) 5 % patch Place 2 patches onto the skin daily 60 patch 2  meloxicam  (MOBIC ) 7.5 MG tablet Take 7.5 mg by mouth once daily  methocarbamoL (ROBAXIN) 500 MG tablet Take 500 mg by mouth 3 (three) times daily  as needed  MYRBETRIQ  50 mg ER tablet Take 50 mg by mouth once daily  nicotine (NICODERM CQ) 14 mg/24 hr patch Apply one patch daily x 12 weeks. 28 patch 2  nicotine polacrilex (NICORETTE) 4 MG gum Chew gum a few times then place between cheek and gum. Use every 30 minutes as needed for smoking urges. Up to 10 a day 100 each 2   OZEMPIC 0.25 mg or 0.5 mg (2 mg/3 mL) pen injector Inject 0.25 mg subcutaneously once a week  potassium chloride  (KLOR-CON ) 10 MEQ ER tablet Take by mouth.  solifenacin (VESICARE) 10 MG tablet Take 10 mg by mouth once daily.  tiZANidine  (ZANAFLEX ) 4 MG tablet Take 4 mg by mouth every 8 (eight) hours as needed for Muscle spasms  topiramate  (TOPAMAX ) 100 MG tablet Take 100 mg by mouth once daily  traMADoL  (ULTRAM ) 50 mg tablet Take 1 tablet (50 mg total) by mouth 2 (two) times daily as needed for Pain 30 tablet 0  valsartan  (DIOVAN ) 40 MG tablet Take 40 mg by mouth once daily (Patient not taking: Reported on 07/01/2024)  zolpidem  (AMBIEN ) 10 mg tablet Take 10 mg by mouth at bedtime as needed for Sleep (Patient not taking: Reported on 07/01/2024)   Allergies: Venom-Honey Bee Anaphylaxis  Penicillins Hives   Review of Systems:  A comprehensive 14 point ROS was performed, reviewed by me today, and the pertinent orthopaedic findings are documented in the HPI.  Physical Exam: BP 136/82  Ht 165.1 cm (5' 5)  Wt (!) 122.9 kg (271 lb)  BMI 45.10 kg/m  General/Constitutional: The patient appears to be well-nourished, well-developed, and in no acute distress. Neuro/Psych: Normal mood and affect, oriented to person, place and time. Eyes: Non-icteric. Pupils are equal, round, and reactive to light, and exhibit synchronous movement. ENT: Unremarkable. Lymphatic: No palpable adenopathy. Respiratory: Lungs clear to auscultation, Normal chest excursion, No wheezes, and Non-labored breathing Cardiovascular: Regular rate and rhythm. No murmurs. and No edema, swelling or tenderness, except as noted in detailed exam. Integumentary: No impressive skin lesions present, except as noted in detailed exam. Musculoskeletal: Unremarkable, except as noted in detailed exam.  Left hip exam: Skin inspection of the left hip is unremarkable. No swelling, erythema, ecchymosis, abrasions, or other skin abnormalities are  identified. She has mild tenderness to palpation over the lateral aspect of the hip. She is able to flex her hip to 85 degrees, internally rotate to 10 degrees, and externally rotate to 20 degrees, but experiences moderate to severe pain with all motions. She is grossly neurovascularly intact to the left lower extremity and foot. Positive logroll maneuver to the left leg.  Imaging: AP and lateral views of the left hip were obtained today in the office and reviewed by me. These x-rays demonstrate moderate to severe degenerative changes of the left hip with loss of both superior and inferior aspect of acetabular joint space. Osteophyte formation off the inferior aspect of the femoral head. No evidence of avascular process. No evidence of acute fracture or evidence of dislocation.  Impression: 1. Primary osteoarthritis of left hip. 2. Lumbar spondylosis.  Plan:  1. Treatment options were discussed today with the patient. 2. The patient presents today continued left hip pain in the setting of moderate to severe left hip osteoarthritic changes. 3. The patient is quite frustrated by her continued left hip discomfort and would like to discuss more aggressive treatment options at this time. 4. The patient is scheduled for a left total hip arthroplasty with  Dr. Edie on 07/03/2024. 5. The risk and benefits of a left total hip arthroplasty were discussed in detail at today's appointment. The patient verbalizes her understanding wishes to proceed.  6. This document will serve as a surgical history and physical for the patient. She can contact the clinic if she has any questions, new symptoms develop or symptoms worsen.  The procedure was discussed with the patient, as were the potential risks (including bleeding, infection, nerve and/or blood vessel injury, persistent or recurrent pain, failure of the hardware, dislocation, leg length inequality need for further surgery, blood clots, strokes, heart attacks  and/or arhythmias, pneumonia, etc.) and benefits. The patient states her understanding and wishes to proceed.    H&P reviewed and patient re-examined. No changes.

## 2024-07-03 NOTE — Op Note (Signed)
 07/03/2024  10:11 AM  Patient:   Kristine Rangel  Pre-Op Diagnosis:   Degenerative joint disease, left hip.  Post-Op Diagnosis:   Same.  Procedure:   Left total hip arthroplasty.  Surgeon:   DOROTHA Reyes Maltos, MD  Assistant:   Gustavo Level, PA-C  Anesthesia:   Spinal  Findings:   As above.  Complications:   None  EBL:   200 cc  Fluids:   600 cc crystalloid  UOP:   None  TT:   None  Drains:   None  Closure:   Staples  Implants:   Biomet press-fit system with a #11 laterally offset Echo femoral stem, a 48 mm acetabular shell with an E-poly hi-wall liner, and a 32 mm ceramic head with a -6 mm neck.  Brief Clinical Note:   The patient is a 68 year old female with a history progressive worsening left hip/groin pain.  Her symptoms have progressed despite medications, activity modification, etc.  Her history and examination consistent with advanced degenerative joint disease confirmed by plain radiographs.  The patient presents at this time for a left total hip arthroplasty.   Procedure:   The patient was brought into the operating room. After adequate spinal anesthesia was obtained, the patient was repositioned in the right lateral decubitus position and secured using a lateral hip positioner. The left hip and lower extremity were prepped with ChloroPrep solution before being draped sterilely. Preoperative antibiotics were administered. A timeout was performed to verify the appropriate surgical site.    A standard posterior approach to the hip was made through an approximately 6-7 inch incision. The incision was carried down through the subcutaneous tissues to expose the gluteal fascia and proximal end of the iliotibial band. These structures were split the length of the incision and the Charnley self-retaining hip retractor placed. The bursal tissues were swept posteriorly to expose the short external rotators. The anterior border of the piriformis tendon was identified and this plane  developed down through the capsule to enter the joint. A flap of tissue was elevated off the posterior aspect of the femoral neck and greater trochanter and retracted posteriorly. This flap included the piriformis tendon, the short external rotators, and the posterior capsule. The soft tissues were elevated off the lateral aspect of the ilium and a large Steinmann pin placed bicortically.   With the left leg aligned over the right, a drill bit was placed into the greater trochanter parallel to the Steinmann pin and the distance between these two pins measured in order to optimize leg lengths postoperatively. The drill bit was removed and the hip dislocated. The piriformis fossa was debrided of soft tissues before the intramedullary canal was accessed through this point using a triple step reamer. The canal was reamed sequentially beginning with a #7 tapered reamer and progressing to a #11 tapered reamer. This provided excellent circumferential chatter. Using the appropriate guide, a femoral neck cut was made 10-12 mm above the lesser trochanter. The femoral head was removed.  Attention was directed to the acetabular side. The labrum was debrided circumferentially before the ligamentum teres was removed using a large curette. A line was drawn on the drapes corresponding to the native version of the acetabulum. This line was used as a guide while the acetabulum was reamed sequentially beginning with a 43 mm reamer and progressing to a 47 mm reamer. This provided excellent circumferential chatter. The 48 mm trial acetabulum was positioned and found to fit quite well. Therefore, the 48 mm  acetabular shell was selected and impacted into place with care taken to maintain the appropriate version. The trial high wall liner was inserted.  Attention was redirected to the femoral side. A box osteotome was used to establish version before the canal was broached sequentially beginning with a #7 broach and progressing to a  #11 broach. This was left in place and several trial reductions performed using both the standard and laterally offset neck options, as well as the -6 mm neck length. After removing the trial components, the manhole cover was placed into the apex of the acetabular shell and tightened securely. The permanent E-polyethylene hi-wall liner was impacted into the acetabular shell and its locking mechanism verified using a quarter-inch osteotome. Next, the #11 laterally offset femoral stem was impacted into place with care taken to maintain the appropriate version. A repeat trial reduction was performed using the -6 mm neck length. The -6 mm neck length demonstrated excellent stability both in extension and external rotation as well as with flexion to 90 and internal rotation beyond 70. It also was stable in the position of sleep. In addition, leg lengths appeared to be restored appropriately, both by reassessing the position of the right leg over the left, as well as by measuring the distance between the Steinmann pin and the drill bit. The 32 mm ceramic head with the -6 mm neck adapter was impacted onto the stem of the femoral component. The Morse taper locking mechanism was verified using manual distraction before the head was relocated and placed through a range of motion with the findings as described above.  The wound was copiously irrigated with sterile saline solution via the jet lavage system before the peri-incisional and pericapsular tissues were injected with a cocktail of 20 cc of Exparel , 30 cc of 0.5% Sensorcaine , 2 cc of Kenalog  40 (80 mg), and 30 mg of Toradol  diluted out to 90 cc with normal saline to help with postoperative analgesia. The posterior flap was reapproximated to the posterior aspect of the greater trochanter using #2 Tycron interrupted sutures placed through drill holes. Several additional #2 Tycron interrupted sutures were used to reinforce this layer of closure. The iliotibial band  was reapproximated using #1 Vicryl interrupted sutures before the gluteal fascia was closed using a running #0 Vicryl suture. The subcutaneous tissues were closed in several layers using 2-0 Vicryl interrupted sutures before the skin was closed using staples. A sterile occlusive dressing was applied to the wound. The patient was then rolled back into the supine position on her hospital bed before being awakened and returned to the recovery room in satisfactory condition after tolerating the procedure well.

## 2024-07-03 NOTE — Evaluation (Signed)
 Physical Therapy Evaluation Patient Details Name: Kristine Rangel MRN: [id number] DOB: 04/27/1956 Today's Date: 07/03/2024  History of Present Illness  68 y/o female patient presents to hospital for elective L THA surgery. She is POD 0 at time of evaluation (07/03/2024).  Clinical Impression  Pt is a pleasant 68 year old female who was admitted for L THA. Pt performs bed mobility and transfers with mod physical assistance. Pt required physical assistance to manage her lower extremeities to sit at the EOB. Once there she was able to maintain her balance. Pt able to perform STS at elevated bed height with mod A. Pt in severe pain, rating 7/10 on NPS. Nursing notified. Prior to hospitalization, pt able to ambulate short distances with bari walker and required assistance with bathing from her home health aid. Mod verbal cues to adhere to posterior hip precautions. Pt demonstrates deficits with strength and ROM. Would benefit from skilled PT to address above deficits and promote optimal return to PLOF.          If plan is discharge home, recommend the following: A lot of help with walking and/or transfers;A lot of help with bathing/dressing/bathroom;Assist for transportation   Can travel by private vehicle   No    Equipment Recommendations None recommended by PT  Recommendations for Other Services       Functional Status Assessment Patient has had a recent decline in their functional status and demonstrates the ability to make significant improvements in function in a reasonable and predictable amount of time.     Precautions / Restrictions Precautions Precautions: Posterior Hip Precaution Booklet Issued: No Recall of Precautions/Restrictions: Intact Restrictions Weight Bearing Restrictions Per Provider Order: Yes LLE Weight Bearing Per Provider Order: Weight bearing as tolerated      Mobility  Bed Mobility Overal bed mobility: Needs Assistance Bed Mobility: Supine to Sit      Supine to sit: Mod assist     General bed mobility comments: Pt able to manage trunk and use bed rails to sit up. Mod assist from PT using chuck pad to bring pt to sitting at EOB. Verbal cuing for weight shift to assist with moving hips forward    Transfers Overall transfer level: Needs assistance Equipment used: Rolling walker (2 wheels) Transfers: Sit to/from Stand, Bed to chair/wheelchair/BSC Sit to Stand: Mod assist, +2 physical assistance   Step pivot transfers: Mod assist, +2 physical assistance       General transfer comment: Pt able to take steps to transfer into recliner. Mod assist from elevated bed to stand. Verbal cues for sequencing and to adhere to posterior hip precautions.    Ambulation/Gait Ambulation/Gait assistance: Max assist             General Gait Details: NT. Pt demonstrates buckling and is in severe pain with movement.  Stairs            Wheelchair Mobility     Tilt Bed    Modified Rankin (Stroke Patients Only)       Balance Overall balance assessment: Needs assistance Sitting-balance support: Feet supported Sitting balance-Leahy Scale: Good Sitting balance - Comments: Able to maintain balance with intermittent UE support.   Standing balance support: Bilateral upper extremity supported, During functional activity, Reliant on assistive device for balance Standing balance-Leahy Scale: Poor Standing balance comment: Pt presents with forward flexed posture. Verbal cues to correct  Pertinent Vitals/Pain Pain Assessment Pain Assessment: 0-10 Pain Score: 7  Pain Location: L hip Pain Descriptors / Indicators: Grimacing, Moaning Pain Intervention(s): Monitored during session, Limited activity within patient's tolerance, Patient requesting pain meds-RN notified    Home Living Family/patient expects to be discharged to:: Private residence Living Arrangements: Alone Available Help at Discharge:  Personal care attendant Type of Home: Apartment Home Access: Level entry       Home Layout: One level Home Equipment: BSC/3in1;Rolling Walker (2 wheels);Tub bench;Grab bars - toilet;Grab bars - tub/shower (bari walker) Additional Comments: Pt reports that she has HH aid to assist with chores and bathing. Help also available on the weekend.    Prior Function Prior Level of Function : Needs assist       Physical Assist : ADLs (physical)   ADLs (physical): Bathing Mobility Comments: Pt ambulates primarily with RW for short distances. ADLs Comments: Pt reports home health aid assists with bathing     Extremity/Trunk Assessment   Upper Extremity Assessment Upper Extremity Assessment: Generalized weakness    Lower Extremity Assessment Lower Extremity Assessment: Generalized weakness    Cervical / Trunk Assessment Cervical / Trunk Assessment: Normal  Communication   Communication Communication: No apparent difficulties    Cognition Arousal: Alert Behavior During Therapy: WFL for tasks assessed/performed   PT - Cognitive impairments: No apparent impairments                       PT - Cognition Comments: Pt is A&Ox4. She is pleasant and agreeable to therapy. Following commands: Intact       Cueing Cueing Techniques: Verbal cues     General Comments General comments (skin integrity, edema, etc.): wound vac left hip    Exercises Other Exercises Other Exercises: chair set up and searching for recliner   Assessment/Plan    PT Assessment Patient needs continued PT services  PT Problem List Decreased strength;Decreased range of motion;Decreased activity tolerance;Decreased balance;Decreased mobility       PT Treatment Interventions Gait training;Functional mobility training;Therapeutic activities;Therapeutic exercise;Balance training;Neuromuscular re-education;Patient/family education    PT Goals (Current goals can be found in the Care Plan section)   Acute Rehab PT Goals Patient Stated Goal: To walk PT Goal Formulation: With patient Time For Goal Achievement: 07/17/24 Potential to Achieve Goals: Good    Frequency BID     Co-evaluation               AM-PAC PT 6 Clicks Mobility  Outcome Measure Help needed turning from your back to your side while in a flat bed without using bedrails?: A Lot Help needed moving from lying on your back to sitting on the side of a flat bed without using bedrails?: A Lot Help needed moving to and from a bed to a chair (including a wheelchair)?: A Lot Help needed standing up from a chair using your arms (e.g., wheelchair or bedside chair)?: A Lot Help needed to walk in hospital room?: A Lot Help needed climbing 3-5 steps with a railing? : A Lot 6 Click Score: 12    End of Session Equipment Utilized During Treatment: Gait belt Activity Tolerance: Patient limited by pain Patient left: in chair;with nursing/sitter in room;with call bell/phone within reach Nurse Communication: Mobility status;Patient requests pain meds PT Visit Diagnosis: Unsteadiness on feet (R26.81);Muscle weakness (generalized) (M62.81);Difficulty in walking, not elsewhere classified (R26.2)    Time: 1451-1531 PT Time Calculation (min) (ACUTE ONLY): 40 min   Charges:  Dvontae Ruan, SPT   Bexley Mclester 07/03/2024, 3:59 PM

## 2024-07-03 NOTE — Plan of Care (Signed)
  Problem: Clinical Measurements: Goal: Will remain free from infection Outcome: Progressing   Problem: Nutrition: Goal: Adequate nutrition will be maintained Outcome: Progressing   Problem: Coping: Goal: Level of anxiety will decrease Outcome: Progressing   Problem: Elimination: Goal: Will not experience complications related to urinary retention Outcome: Progressing   Problem: Safety: Goal: Ability to remain free from injury will improve Outcome: Progressing

## 2024-07-03 NOTE — Transfer of Care (Signed)
 Immediate Anesthesia Transfer of Care Note  Patient: Kristine Rangel  Procedure(s) Performed: ARTHROPLASTY, HIP, TOTAL,POSTERIOR APPROACH (Left: Hip)  Patient Location: PACU  Anesthesia Type:Spinal  Level of Consciousness: drowsy  Airway & Oxygen Therapy: Patient Spontanous Breathing and Patient connected to nasal cannula oxygen  Post-op Assessment: Report given to RN and Post -op Vital signs reviewed and stable  Post vital signs: Reviewed and stable  Last Vitals:  Vitals Value Taken Time  BP 111/58 07/03/24 10:22  Temp    Pulse 56 07/03/24 10:25  Resp 13 07/03/24 10:25  SpO2 100 % 07/03/24 10:25  Vitals shown include unfiled device data.  Last Pain:  Vitals:   07/03/24 0625  TempSrc: Temporal  PainSc: 3          Complications: No notable events documented.

## 2024-07-03 NOTE — Anesthesia Preprocedure Evaluation (Signed)
 Anesthesia Evaluation  Patient identified by MRN, date of birth, ID band Patient awake    Reviewed: Allergy & Precautions, H&P , NPO status , Patient's Chart, lab work & pertinent test results, reviewed documented beta blocker date and time   History of Anesthesia Complications Negative for: history of anesthetic complications  Airway Mallampati: II  TM Distance: >3 FB Neck ROM: Full    Dental  (+) Edentulous Upper, Missing, Dental Advidsory Given,    Pulmonary shortness of breath, asthma , sleep apnea and Continuous Positive Airway Pressure Ventilation , COPD,  COPD inhaler, neg recent URI, Not current smoker, former smoker   Pulmonary exam normal        Cardiovascular Exercise Tolerance: Poor hypertension, Pt. on medications and Pt. on home beta blockers (-) angina + CAD (09/11/2011: minor luminal irregulaties mLAD, mLCx, mRCA - med mgmt.), + Past MI, +CHF (Grade II DD) and + DOE  (-) Cardiac Stents (-) dysrhythmias + Valvular Problems/Murmurs (Nonrheumatic mitral valve regurgitation) MR  Rhythm:Regular Rate:Normal + Peripheral Edema Peripheral Edema  ECHO 02/2021: 1. Left ventricular ejection fraction, by estimation, is 60 to 65%. The  left ventricle has normal function. The left ventricle has no regional  wall motion abnormalities. Left ventricular diastolic parameters are  consistent with Grade II diastolic  dysfunction (pseudonormalization).  2. Right ventricular systolic function is normal. The right ventricular  size is normal. There is normal pulmonary artery systolic pressure. The  estimated right ventricular systolic pressure is 32.6 mmHg.  3. Left atrial size was mildly dilated.   Neuro/Psych  Headaches, neg Seizures PSYCHIATRIC DISORDERS Anxiety Depression    Cerebral aneurysm history of coils TIA Neuromuscular disease (Nueropathy) CVA (Pt unaware of CVAs, noted on scan)    GI/Hepatic Neg liver ROS,GERD   Medicated and Controlled,,  Endo/Other  diabetes, Type 2, Oral Hypoglycemic Agents  Class 3 obesity  Renal/GU Renal InsufficiencyRenal disease Bladder dysfunction      Musculoskeletal  (+) Arthritis ,    Abdominal  (+) + obese  Peds negative pediatric ROS (+)  Hematology negative hematology ROS (+)   Anesthesia Other Findings Glaucoma  PAT note reviewed  Per cardiology, though Ms. Wicklund's functional capacity is limited, she can perform 4 METS of activity without chest pain or dyspnea. She can proceed with her shoulder surgery as planned, which is a low-risk procedure, without further cardiac testing or intervention  Reproductive/Obstetrics negative OB ROS                              Anesthesia Physical Anesthesia Plan  ASA: 3  Anesthesia Plan: Spinal   Post-op Pain Management:    Induction: Intravenous  PONV Risk Score and Plan: 3 and Treatment may vary due to age or medical condition, Propofol  infusion and TIVA  Airway Management Planned: Natural Airway and Simple Face Mask  Additional Equipment:   Intra-op Plan:   Post-operative Plan:   Informed Consent: I have reviewed the patients History and Physical, chart, labs and discussed the procedure including the risks, benefits and alternatives for the proposed anesthesia with the patient or authorized representative who has indicated his/her understanding and acceptance.       Plan Discussed with: CRNA and Anesthesiologist  Anesthesia Plan Comments:          Anesthesia Quick Evaluation

## 2024-07-03 NOTE — Anesthesia Procedure Notes (Signed)
 Spinal  Patient location during procedure: OR Start time: 07/03/2024 7:41 AM End time: 07/03/2024 7:41 AM Reason for block: surgical anesthesia Staffing Performed: resident/CRNA  Resident/CRNA: Niki Manus SAUNDERS, CRNA Performed by: Niki Manus SAUNDERS, CRNA Authorized by: Dario Barter, MD   Preanesthetic Checklist Completed: patient identified, IV checked, site marked, risks and benefits discussed, surgical consent, monitors and equipment checked, pre-op evaluation and timeout performed Spinal Block Patient position: sitting Prep: ChloraPrep Patient monitoring: heart rate, continuous pulse ox, blood pressure and cardiac monitor Approach: midline Location: L4-5 Injection technique: single-shot Needle Needle type: Quincke  Needle gauge: 22 G Needle length: 12.7 cm Assessment Sensory level: T6 Events: CSF return Additional Notes Negative paresthesia. Negative blood return. Positive free-flowing CSF. Expiration date of kit checked and confirmed. Patient tolerated procedure well, without complications.

## 2024-07-04 ENCOUNTER — Encounter: Payer: Self-pay | Admitting: Surgery

## 2024-07-04 DIAGNOSIS — M1612 Unilateral primary osteoarthritis, left hip: Secondary | ICD-10-CM | POA: Diagnosis not present

## 2024-07-04 LAB — GLUCOSE, CAPILLARY
Glucose-Capillary: 123 mg/dL — ABNORMAL HIGH (ref 70–99)
Glucose-Capillary: 124 mg/dL — ABNORMAL HIGH (ref 70–99)
Glucose-Capillary: 105 mg/dL — ABNORMAL HIGH (ref 70–99)
Glucose-Capillary: 110 mg/dL — ABNORMAL HIGH (ref 70–99)

## 2024-07-04 LAB — CBC
HCT: 31.5 % — ABNORMAL LOW (ref 36.0–46.0)
Hemoglobin: 10.1 g/dL — ABNORMAL LOW (ref 12.0–15.0)
MCH: 26.6 pg (ref 26.0–34.0)
MCHC: 32.1 g/dL (ref 30.0–36.0)
MCV: 83.1 fL (ref 80.0–100.0)
Platelets: 300 K/uL (ref 150–400)
RBC: 3.79 MIL/uL — ABNORMAL LOW (ref 3.87–5.11)
RDW: 15.9 % — ABNORMAL HIGH (ref 11.5–15.5)
WBC: 14.3 K/uL — ABNORMAL HIGH (ref 4.0–10.5)
nRBC: 0 % (ref 0.0–0.2)

## 2024-07-04 LAB — BASIC METABOLIC PANEL WITH GFR
Anion gap: 6 (ref 5–15)
BUN: 18 mg/dL (ref 8–23)
CO2: 24 mmol/L (ref 22–32)
Calcium: 9.3 mg/dL (ref 8.9–10.3)
Chloride: 108 mmol/L (ref 98–111)
Creatinine, Ser: 1.05 mg/dL — ABNORMAL HIGH (ref 0.44–1.00)
GFR, Estimated: 57 mL/min — ABNORMAL LOW (ref >60)
Glucose, Bld: 139 mg/dL — ABNORMAL HIGH (ref 70–99)
Potassium: 4.2 mmol/L (ref 3.5–5.1)
Sodium: 138 mmol/L (ref 135–145)

## 2024-07-04 MED ORDER — OXYCODONE HCL 5 MG PO TABS: 5.0000 mg | ORAL_TABLET | ORAL | 0 refills | Status: AC | PRN

## 2024-07-04 MED ORDER — APIXABAN 2.5 MG PO TABS: 2.5000 mg | ORAL_TABLET | Freq: Two times a day (BID) | ORAL | 0 refills | Status: DC

## 2024-07-04 NOTE — Discharge Summary (Signed)
 Physician Discharge Summary  Patient ID: Kristine Rangel MRN: [id number] DOB/AGE: 1956/04/24 68 y.o.  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]  Discharge Diagnoses: Patient Active Problem List   Diagnosis Date Noted   Status post total hip replacement, left 07/03/2024   Status post total hip replacement, right 02/21/2024   Right sided weakness 05/15/2022   GERD (gastroesophageal reflux disease) 05/15/2022   Peripheral neuropathy 05/15/2022   Hyperlipidemia associated with type 2 diabetes mellitus (HCC) 05/15/2022   OSA (obstructive sleep apnea) 05/15/2022   Chronic heart failure with preserved ejection fraction (HFpEF) (HCC) 05/15/2022   Asthma, chronic 05/15/2022   Chronic kidney disease, stage 3a (HCC) 05/15/2022   Chest pain 02/05/2021   SOB (shortness of breath) 02/05/2021   Leg swelling 02/05/2021   Nonrheumatic mitral valve regurgitation 02/05/2021   Morbid obesity (HCC) 02/05/2021   S/P revision of total knee, right 05/05/2020   S/P right rotator cuff repair 07/30/2018   S/P left rotator cuff repair 11/23/2016   Essential hypertension 02/05/2003    Past Medical History:  Diagnosis Date   (HFpEF) heart failure with preserved ejection fraction (HCC)    a.) TTE 03/03/2021: EF 60-65%, no RWMAs, mild LA dil, RVSP 32.6, G2DD; b.) TTE 05/17/2022: EF 60-65%, no RWMAs, normal RVSF, G1DD   Anemia    when she was younger   Anxiety    Arthritis    Asthma    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   Bladder leak    Cardiac murmur    Chest pain    a.) 10/07/2008 Neg Dobuatmine Echo; b.) LHC 09/11/2011 - minor lum irregs; no sig CAD; c.) MV  06/02/2015: no ischemia; d.) cCTA 02/2021: Ca2+ = 0. Normal coronaries   CKD (chronic kidney disease), stage III (HCC)    COPD (chronic obstructive pulmonary disease) (HCC)    Depression    Diet-controlled type 2 diabetes mellitus (HCC)    Dyspnea    Essential hypertension    GERD (gastroesophageal reflux disease)    Glaucoma    History of kidney stones    History of left heart catheterization (LHC) 09/11/2011   a.) LHC 09/11/2011: minor luminal irregulaties mLAD, mLCx, mRCA - med mgmt.   Hyperlipidemia    Insomnia    a.) takes suvorexant    MI (myocardial infarction) (HCC)    a.) in the 1990s; details unclear   Migraine with aura    Neuropathy    On apixaban  therapy    OSA on CPAP    Primary osteoarthritis of left hip    Rotator cuff injury    Seasonal allergies    Stroke [facility])    TIA   Tendinitis of right rotator cuff    TIA (transient ischemic attack)      Transfusion: None.   Consultants (if any):   Discharged Condition: Improved  Hospital Course: Kristine Rangel is an 68 y.o. female who was admitted 07/03/2024 with a diagnosis of primary osteoarthritis of the left hip and went to the operating room on 07/03/2024 and underwent the above named procedures.    Surgeries: Procedure(s): ARTHROPLASTY, HIP, TOTAL,POSTERIOR APPROACH on 07/03/2024 Patient tolerated the surgery well. Taken to PACU where she was stabilized and then transferred  to the orthopedic floor.  Started on Eliquis  2.5mg  every 12 hrs.. Heels elevated on bed with rolled towels. No evidence of DVT. Negative Homan. Physical therapy started on day #1 for gait training and transfer. OT started day #1 for ADL and assisted devices.  Patient's IV was removed on POD4  Implants: Biomet press-fit system with a #11 laterally offset Echo femoral stem, a 48 mm acetabular shell with an E-poly hi-wall liner, and a 32 mm ceramic head with a -6 mm neck.   She was given perioperative antibiotics:  Anti-infectives (From  admission, onward)    Start     Dose/Rate Route Frequency Ordered Stop   07/03/24 1500  ceFAZolin  (ANCEF ) IVPB 3g/150 mL premix        3 g 300 mL/hr over 30 Minutes Intravenous Every 6 hours 07/03/24 1148 07/03/24 2101   07/03/24 0630  ceFAZolin  (ANCEF ) IVPB 2g/100 mL premix        2 g 200 mL/hr over 30 Minutes Intravenous On call to O.R. 07/03/24 [id number] 07/03/24 0756     .  She was given sequential compression devices, early ambulation, and Eliquis  for DVT prophylaxis.  She benefited maximally from the hospital stay and there were no complications.    Recent vital signs:  Vitals:   07/07/24 0357 07/07/24 0802  BP: 118/60 127/68  Pulse: 64 (!) 59  Resp: 18 16  Temp: 97.9 F (36.6 C) 97.7 F (36.5 C)  SpO2: 99% 99%    Recent laboratory studies:  Lab Results  Component Value Date   HGB 10.2 (L) 07/05/2024   HGB 10.1 (L) 07/04/2024   HGB 11.8 (L) 06/26/2024   Lab Results  Component Value Date   WBC 13.2 (H) 07/05/2024   PLT 300 07/05/2024   Lab Results  Component Value Date   INR 1.0 04/27/2020   Lab Results  Component Value Date   NA 138 07/04/2024   K 4.2 07/04/2024   CL 108 07/04/2024   CO2 24 07/04/2024   BUN 18 07/04/2024   CREATININE 1.05 (H) 07/04/2024   GLUCOSE 139 (H) 07/04/2024    Discharge Medications:   Allergies as of 07/07/2024       Reactions   Bee Venom Anaphylaxis   Penicillins Hives, Swelling, Other (See Comments)   Has patient had a PCN reaction causing immediate rash, facial/tongue/throat swelling, SOB or lightheadedness with hypotension: Yes Has patient had a PCN reaction causing severe rash involving mucus membranes or skin necrosis: No Has patient had a PCN reaction that required hospitalization No Has patient had a PCN reaction occurring within the last 10 years: No If all of the above answers are NO, then may proceed with Cephalosporin use.        Medication List     STOP taking these medications    traMADol  50 MG  tablet Commonly known as: ULTRAM        TAKE these medications    acetaminophen  500 MG tablet Commonly known as: TYLENOL  Take 2 tablets (1,000 mg total) by mouth every 6 (six) hours. What changed:  when to take this reasons to take this   albuterol  108 (90 Base) MCG/ACT inhaler Commonly known as: VENTOLIN  HFA Inhale 1-2 puffs into the lungs every 6 (six) hours as needed for wheezing or shortness of breath.   albuterol  (2.5 MG/3ML) 0.083% nebulizer solution Commonly known as: PROVENTIL  Take 2.5 mg by nebulization every 6 (six) hours as needed for wheezing or shortness of breath.   apixaban  2.5 MG  Tabs tablet Commonly known as: ELIQUIS  Take 1 tablet (2.5 mg total) by mouth 2 (two) times daily.   atorvastatin  80 MG tablet Commonly known as: LIPITOR  TAKE 1 TABLET BY MOUTH DAILY   Belsomra  20 MG Tabs Generic drug: Suvorexant  Take 20 mg by mouth at bedtime.   carvedilol  6.25 MG tablet Commonly known as: COREG  TAKE 1 TABLET BY MOUTH TWICE A DAY   cetirizine 10 MG tablet Commonly known as: ZYRTEC Take 10 mg by mouth daily.   chlorhexidine  4 % external liquid Commonly known as: HIBICLENS  Apply 15 mLs (1 Application total) topically as directed for 30 doses. Use as directed daily for 5 days every other week for 6 weeks.   Combigan  0.2-0.5 % ophthalmic solution Generic drug: brimonidine -timolol  Place 1 drop into both eyes in the morning and at bedtime.   DULoxetine  60 MG capsule Commonly known as: CYMBALTA  Take 60 mg by mouth at bedtime.   empagliflozin  25 MG Tabs tablet Commonly known as: JARDIANCE  Take 25 mg by mouth daily.   EPINEPHrine  0.3 mg/0.3 mL Soaj injection Commonly known as: EPI-PEN Inject 0.3 mg into the skin as needed for anaphylaxis.   esomeprazole 40 MG capsule Commonly known as: NEXIUM Take 40 mg by mouth 2 (two) times daily before a meal.   fluticasone  50 MCG/ACT nasal spray Commonly known as: FLONASE  Place 1 spray into both nostrils daily.    furosemide  40 MG tablet Commonly known as: LASIX  Take 40 mg by mouth 2 (two) times daily.   gabapentin  300 MG capsule Commonly known as: NEURONTIN  Take 300-600 mg by mouth 3 (three) times daily.   isosorbide  mononitrate 60 MG 24 hr tablet Commonly known as: IMDUR  TAKE 1.5 TABLETS (90 MG TOTAL) BY MOUTH DAILY   lidocaine  5 % Commonly known as: LIDODERM  Place 2 patches onto the skin daily as needed (pain).   Melatonin 3 MG Caps Take by mouth as needed.   mupirocin  ointment 2 % Commonly known as: BACTROBAN  Place 1 Application into the nose 2 (two) times daily for 60 doses. Use as directed 2 times daily for 5 days every other week for 6 weeks.   Myrbetriq  50 MG Tb24 tablet Generic drug: mirabegron  ER Take 50 mg by mouth daily.   nitroGLYCERIN  0.4 MG SL tablet Commonly known as: NITROSTAT  Place 0.4 mg under the tongue every 5 (five) minutes as needed for chest pain.   ondansetron  4 MG tablet Commonly known as: ZOFRAN  Take 1 tablet (4 mg total) by mouth every 6 (six) hours as needed for nausea.   oxyCODONE  5 MG immediate release tablet Commonly known as: Oxy IR/ROXICODONE  Take 1-2 tablets (5-10 mg total) by mouth every 4 (four) hours as needed for moderate pain (pain score 4-6).   Ozempic (0.25 or 0.5 MG/DOSE) 2 MG/3ML Sopn Generic drug: Semaglutide(0.25 or 0.5MG /DOS) Inject 0.25 mg into the skin every Tuesday.   QC TUMERIC COMPLEX PO Take by mouth daily.   Symbicort 160-4.5 MCG/ACT inhaler Generic drug: budesonide-formoterol  Inhale 2 puffs into the lungs daily as needed (shortness of breath).   tiZANidine  4 MG tablet Commonly known as: ZANAFLEX  Take 4 mg by mouth 3 (three) times daily.   topiramate  50 MG tablet Commonly known as: TOPAMAX  Take 50 mg by mouth at bedtime.               Durable Medical Equipment  (From admission, onward)           Start     Ordered   07/03/24 1149  DME 3 n 1  Once        07/03/24 1148   07/03/24 1149  DME Walker  rolling  Once       Question Answer Comment  Walker: With 5 Inch Wheels   Patient needs a walker to treat with the following condition Status post total hip replacement, left      07/03/24 1148           Diagnostic Studies: DG HIP UNILAT W OR W/O PELVIS 2-3 VIEWS LEFT Result Date: 07/03/2024 CLINICAL DATA:  [id number] Status post total hip replacement, left [id number] EXAM: DG HIP (WITH OR WITHOUT PELVIS) 2-3V LEFT COMPARISON:  02/21/2024. FINDINGS: Status post left total hip arthroplasty with normal alignment. No evidence of acute complication. Expected postoperative soft tissue swelling and soft tissue air along the left hip. Prior right total hip arthroplasty. IMPRESSION: Status post left total hip arthroplasty with normal alignment. No evidence of acute complication. Electronically Signed   By: Harrietta Sherry M.D.   On: 07/03/2024 11:35   Disposition: Plan for discharge home pending progress with PT.     Follow-up Information     Kip Lynwood Double, PA-C Follow up in 14 day(s).   Specialty: Physician Assistant Why: [facility] [place] information: [address] [place] [address] [phone]                Signed: Lynwood LITTIE Kip PA-C 07/07/2024, 11:52 AM

## 2024-07-04 NOTE — Evaluation (Signed)
 Occupational Therapy Evaluation Patient Details Name: Kristine Rangel MRN: [id number] DOB: 1956/06/15 Today's Date: 07/04/2024   History of Present Illness   68 y/o female patient presents to hospital for elective L THA surgery. She is POD 1 at time of evaluation (07/03/2024). PMH of R THA on 02/21/24, asthma, COPD, former smoker, past MI, HTN, anxiety. depression, CVA, s/p brain surgery (anuerysm repair), DMII, renal disease, CHF, rotator cuff surgery, glaucoma, R TKA revision, R TSA 08/02/23.     Clinical Impressions Pt was seen for OT evaluation this date. PTA, pt was living alone in a one level house. She has personal care aides available seemingly as needed who assist with bathing, but will assist with whatever she needs per report. She was ambulating household distances with a RW.   Pt presents with deficits in strength, balance, activity tolerance, and pain management, affecting safe and optimal ADL completion. Pt currently requires Min/Mod A for STS from recliner with cues for pushing up from recliner and L foot placement to reduce pressure on hip when returning to seated position. Pt with increased pain to L hip, but able to stand ~1 min. Seated grooming tasks performed in recliner with set up assist. Edu on posterior hip precautions and use of DME/AE/AD for ADL performance to adhere to hip precautions. Pt is aware and reports her aide will assist with these things. Pt is hoping to return home with aide assist on DC. Pt would benefit from skilled OT services to address noted impairments and functional limitations to maximize safety and independence while minimizing future risk of falls, injury, and readmission. Do anticipate the need for follow up OT services upon acute hospital DC.      If plan is discharge home, recommend the following:   A little help with walking and/or transfers;A lot of help with bathing/dressing/bathroom;Help with stairs or ramp for entrance     Functional Status  Assessment   Patient has had a recent decline in their functional status and demonstrates the ability to make significant improvements in function in a reasonable and predictable amount of time.     Equipment Recommendations   Other (comment) (defer, but appears to have needed equipment)     Recommendations for Other Services         Precautions/Restrictions   Precautions Precautions: Posterior Hip Precaution Booklet Issued: No (will bring next session) Recall of Precautions/Restrictions: Intact Restrictions Weight Bearing Restrictions Per Provider Order: Yes LLE Weight Bearing Per Provider Order: Weight bearing as tolerated     Mobility Bed Mobility               General bed mobility comments: NT up in recliner on entry/end of session    Transfers Overall transfer level: Needs assistance Equipment used: Rolling walker (2 wheels) (BRW) Transfers: Sit to/from Stand Sit to Stand: Min assist, Mod assist           General transfer comment: able to stand from recliner to RW with Min/Mod A and cues for hand placement; able to stand ~1 min      Balance Overall balance assessment: Needs assistance Sitting-balance support: Feet supported Sitting balance-Leahy Scale: Good     Standing balance support: Bilateral upper extremity supported, During functional activity, Reliant on assistive device for balance Standing balance-Leahy Scale: Fair Standing balance comment: no LOB in standing with BUE support on RW  ADL either performed or assessed with clinical judgement   ADL Overall ADL's : Needs assistance/impaired     Grooming: Oral care;Wash/dry face;Sitting;Set up Grooming Details (indicate cue type and reason): in recliner                               General ADL Comments: anticipate Max A for LB ADLs at this time d/t post hip precautions     Vision         Perception         Praxis          Pertinent Vitals/Pain Pain Assessment Pain Assessment: Faces Faces Pain Scale: Hurts whole lot Pain Location: L hip Pain Descriptors / Indicators: Grimacing, Guarding Pain Intervention(s): Monitored during session, Repositioned, Ice applied, Limited activity within patient's tolerance     Extremity/Trunk Assessment Upper Extremity Assessment Upper Extremity Assessment: Generalized weakness   Lower Extremity Assessment Lower Extremity Assessment: Generalized weakness   Cervical / Trunk Assessment Cervical / Trunk Assessment: Normal   Communication Communication Communication: No apparent difficulties   Cognition Arousal: Alert Behavior During Therapy: WFL for tasks assessed/performed                                 Following commands: Intact       Cueing  General Comments   Cueing Techniques: Verbal cues  replaced ice pack to L hip at end of session, reports she would like to DC home as she has an aide who will assist and be able to physically lift her, etc.   Exercises Other Exercises Other Exercises: Edu on role of OT in acute setting, posterior hip precautions, use of AE/AD/DME for safe ADL performance while adhering to hip precautions.   Shoulder Instructions      Home Living Family/patient expects to be discharged to:: Private residence Living Arrangements: Alone Available Help at Discharge: Personal care attendant Type of Home: Apartment Home Access: Level entry     Home Layout: One level     Bathroom Shower/Tub: Tub/shower unit         Home Equipment: Teacher, English as a foreign language (2 wheels);Tub bench;Grab bars - toilet;Grab bars - tub/shower (bari walker)   Additional Comments: Pt reports that she has HH aid to assist with chores and bathing. Help also available on the weekend.      Prior Functioning/Environment Prior Level of Function : Needs assist       Physical Assist : ADLs (physical)   ADLs (physical): Bathing Mobility  Comments: Pt ambulates primarily with RW for short distances. ADLs Comments: Pt reports home health aid assists with bathing    OT Problem List: Decreased strength;Decreased activity tolerance;Impaired balance (sitting and/or standing);Pain   OT Treatment/Interventions: Self-care/ADL training;Therapeutic exercise;Energy conservation;DME and/or AE instruction;Patient/family education;Therapeutic activities;Balance training      OT Goals(Current goals can be found in the care plan section)   Acute Rehab OT Goals Patient Stated Goal: improve function and go home OT Goal Formulation: With patient Time For Goal Achievement: 07/18/24 Potential to Achieve Goals: Good ADL Goals Pt Will Perform Upper Body Bathing: sitting;with set-up Pt Will Perform Lower Body Bathing: with min assist;sit to/from stand;sitting/lateral leans;with adaptive equipment Pt Will Perform Lower Body Dressing: with min assist;sit to/from stand;sitting/lateral leans;with adaptive equipment Pt Will Transfer to Toilet: with contact guard assist;ambulating;bedside commode;grab bars   OT Frequency:  Min 3X/week  Co-evaluation              AM-PAC OT 6 Clicks Daily Activity     Outcome Measure Help from another person eating meals?: None Help from another person taking care of personal grooming?: None Help from another person toileting, which includes using toliet, bedpan, or urinal?: A Lot Help from another person bathing (including washing, rinsing, drying)?: A Lot Help from another person to put on and taking off regular upper body clothing?: A Little Help from another person to put on and taking off regular lower body clothing?: A Lot 6 Click Score: 17   End of Session Equipment Utilized During Treatment: Gait belt;Rolling walker (2 wheels) Nurse Communication: Mobility status  Activity Tolerance: Patient tolerated treatment well Patient left: in chair;with call bell/phone within reach;with chair alarm  set  OT Visit Diagnosis: Other abnormalities of gait and mobility (R26.89);Muscle weakness (generalized) (M62.81)                Time: 1047-1101 OT Time Calculation (min): 14 min Charges:  OT General Charges $OT Visit: 1 Visit OT Evaluation $OT Eval Moderate Complexity: 1 Mod Bosten Newstrom, OTR/L 07/04/24, 1:45 PM  Jarmaine Ehrler E Dquan Cortopassi 07/04/2024, 1:38 PM

## 2024-07-04 NOTE — Progress Notes (Signed)
 Physical Therapy Treatment Patient Details Name: Kristine Rangel MRN: [id number] DOB: 1956/05/31 Today's Date: 07/04/2024   History of Present Illness 68 y/o female patient presents to hospital for elective L THA surgery. She is POD 0 at time of evaluation (07/03/2024).    PT Comments  Pt was still in recliner from earlier session however eager to get back into bed. PT requested to use bariatric BSC prior to returning to supine. Pt was able to stand from recliner however struggles to maintain 90 degree hip precaution during transfers. Once in standing, pt moves well but is pain limited. Pt requires more assistance  to re-enter bed than to exit bed. HEP handout issued but pt requested to wait to perform until later sessions. Author discussed DC recs with pt. Dc recs do remain appropriate however pt is planning to DC directly home with caregiver support + HH services.    If plan is discharge home, recommend the following: A little help with walking and/or transfers;A lot of help with bathing/dressing/bathroom;Assistance with cooking/housework;Direct supervision/assist for medications management;Direct supervision/assist for financial management;Assist for transportation;Help with stairs or ramp for entrance     Equipment Recommendations  None recommended by PT (Pt endorses having all needs DME needs met. lift chair, BRW, BBSC, etc.)       Precautions / Restrictions Precautions Precautions: Posterior Hip Precaution Booklet Issued: No (will bring next session) Recall of Precautions/Restrictions: Intact Restrictions Weight Bearing Restrictions Per Provider Order: Yes LLE Weight Bearing Per Provider Order: Weight bearing as tolerated     Mobility  Bed Mobility Overal bed mobility: Needs Assistance Bed Mobility: Sit to Supine  Supine to sit: Used rails, HOB elevated, Mod assist Sit to supine: Mod assist, Max assist, HOB elevated, Used rails General bed mobility comments: vcs for technique and  sequnecing improvements    Transfers Overall transfer level: Needs assistance Equipment used: Rolling walker (2 wheels) (Bariatric) Transfers: Sit to/from Stand Sit to Stand: Min assist, Mod assist, From elevated surface Step pivot transfers: Min assist, Mod assist, From elevated surface  General transfer comment: Pt was able to stand form recliner and BSC during session. successfully urinated in [facility] prior to standing and perform self hygiene care.    Ambulation/Gait Ambulation/Gait assistance: Min assist Gait Distance (Feet): 8 Feet Assistive device: Rolling walker (2 wheels) (Bariatric) Gait Pattern/deviations: Step-to pattern Gait velocity: decreased  General Gait Details: Pt does well during standing activity but struggles to adhere to hip precautions to achieve standing. no LOB with limited gait. I'll walk further tomorrow when its not hurting so bad.    Balance Overall balance assessment: Needs assistance Sitting-balance support: Feet supported Sitting balance-Leahy Scale: Good Sitting balance - Comments: no LOB while seated EOB with feet supported on floor   Standing balance support: Bilateral upper extremity supported, During functional activity, Reliant on assistive device for balance Standing balance-Leahy Scale: Good Standing balance comment: no LOB in standing with BUE support on RW       Communication Communication Communication: No apparent difficulties  Cognition Arousal: Alert Behavior During Therapy: WFL for tasks assessed/performed   PT - Cognitive impairments: No apparent impairments   PT - Cognition Comments: Pt is A&Ox4. She is pleasant and agreeable to therapy. continues to plabn onDCing directly home form acute admission Following commands: Intact      Cueing Cueing Techniques: Verbal cues     General Comments General comments (skin integrity, edema, etc.): Issued HEP once pt qws back in bed however pt unwilling to perform at  ths time due to  pain/fatigue. will need to review exercises in future sessions      Pertinent Vitals/Pain Pain Assessment Pain Assessment: 0-10 Pain Score: 4  Pain Location: L hip Pain Descriptors / Indicators: Grimacing Pain Intervention(s): Limited activity within patient's tolerance, Monitored during session, Repositioned, Ice applied    Home Living Family/patient expects to be discharged to:: Private residence Living Arrangements: Alone Available Help at Discharge: Personal care attendant Type of Home: Apartment Home Access: Level entry       Home Layout: One level Home Equipment: BSC/3in1;Rolling Environmental consultant (2 wheels);Tub bench;Grab bars - toilet;Grab bars - tub/shower (bari walker) Additional Comments: Pt reports that she has HH aid to assist with chores and bathing. Help also available on the weekend.        PT Goals (current goals can now be found in the care plan section) Acute Rehab PT Goals Patient Stated Goal: get well so I can go home Progress towards PT goals: Progressing toward goals    Frequency    BID       AM-PAC PT 6 Clicks Mobility   Outcome Measure  Help needed turning from your back to your side while in a flat bed without using bedrails?: A Little Help needed moving from lying on your back to sitting on the side of a flat bed without using bedrails?: A Lot Help needed moving to and from a bed to a chair (including a wheelchair)?: A Lot Help needed standing up from a chair using your arms (e.g., wheelchair or bedside chair)?: A Lot Help needed to walk in hospital room?: A Little Help needed climbing 3-5 steps with a railing? : A Lot 6 Click Score: 14    End of Session   Activity Tolerance: Patient tolerated treatment well Patient left: in chair;with nursing/sitter in room;with call bell/phone within reach Nurse Communication: Mobility status;Patient requests pain meds PT Visit Diagnosis: Unsteadiness on feet (R26.81);Muscle weakness (generalized)  (M62.81);Difficulty in walking, not elsewhere classified (R26.2)     Time: 8671-8648 PT Time Calculation (min) (ACUTE ONLY): 23 min  Charges:    $Therapeutic Activity: 23-37 mins PT General Charges $$ ACUTE PT VISIT: 1 Visit                    Rankin Essex PTA 07/04/24, 2:33 PM

## 2024-07-04 NOTE — Progress Notes (Signed)
 Subjective: 1 Day Post-Op Procedure(s) (LRB): ARTHROPLASTY, HIP, TOTAL,POSTERIOR APPROACH (Left) Patient reports pain as moderate.   Patient is well, and has had no acute complaints or problems PT and care management to assist with d/c planning.  Current plan is to return home, possibly today vs. Tomorrow, may need SNF. Negative for chest pain and shortness of breath Fever: no Gastrointestinal:Negative for nausea and vomiting Reports she is passing some gas this Am.  Objective: Vital signs in last 24 hours: Temp:  [96.8 F (36 C)-98.7 F (37.1 C)] 98.7 F (37.1 C) (08/22 0533) Pulse Rate:  [56-73] 64 (08/22 0533) Resp:  [12-20] 20 (08/22 0533) BP: (95-147)/(50-89) 110/54 (08/22 0533) SpO2:  [96 %-100 %] 96 % (08/22 0533)  Intake/Output from previous day:  Intake/Output Summary (Last 24 hours) at 07/04/2024 0747 Last data filed at 07/03/2024 2153 Gross per 24 hour  Intake 1671.64 ml  Output 200 ml  Net 1471.64 ml    Intake/Output this shift: No intake/output data recorded.  Labs: Recent Labs    07/04/24 0443  HGB 10.1*   Recent Labs    07/04/24 0443  WBC 14.3*  RBC 3.79*  HCT 31.5*  PLT 300   Recent Labs    07/04/24 0443  NA 138  K 4.2  CL 108  CO2 24  BUN 18  CREATININE 1.05*  GLUCOSE 139*  CALCIUM  9.3   No results for input(s): LABPT, INR in the last 72 hours.   EXAM General - Patient is Alert, Appropriate, and Oriented Extremity - ABD soft Neurovascular intact Dorsiflexion/Plantar flexion intact Incision: Prevena intact without drainage. No cellulitis present Compartment soft Dressing/Incision - Prevena sponge intact with good seal. Motor Function - intact, moving foot and toes well on exam.  Abdomen soft with intact bowel sounds.  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   Bladder leak    Cardiac murmur    Chest pain    a.) 10/07/2008 Neg [place] Echo; b.) LHC 09/11/2011 - minor lum irregs; no sig CAD; c.) MV 06/02/2015: no ischemia; d.) cCTA 02/2021: Ca2+ = 0. Normal coronaries   CKD (chronic kidney disease), stage III (HCC)    COPD (chronic obstructive pulmonary disease) (HCC)    Depression    Diet-controlled type 2 diabetes mellitus (HCC)    Dyspnea    Essential hypertension    GERD (gastroesophageal reflux disease)    Glaucoma    History of kidney stones    History of left heart catheterization (LHC) 09/11/2011   a.) LHC 09/11/2011: minor luminal irregulaties mLAD, mLCx, mRCA - med mgmt.   Hyperlipidemia    Insomnia    a.) takes suvorexant    MI (myocardial infarction) (HCC)    a.) in the 1990s; details unclear   Migraine with aura    Neuropathy    On apixaban  therapy    OSA on CPAP    Primary osteoarthritis of left hip  Rotator cuff injury    Seasonal allergies    Stroke [employer])    TIA   Tendinitis of right rotator cuff    TIA (transient ischemic attack)     Assessment/Plan: 1 Day Post-Op Procedure(s) (LRB): ARTHROPLASTY, HIP, TOTAL,POSTERIOR APPROACH (Left) Principal Problem:   Status post total hip replacement, left  Estimated body mass index is 47.96 kg/m as calculated from the following:   Height as of 06/26/24: 5' 3 (1.6 m).   Weight as of 06/26/24: 122.8 kg. Advance diet Up with therapy D/C IV fluids when tolerating po intake.  Labs and vitals reviewed, Hg 10.1, WBC 14.3 this AM. Prevena intact with good seal, no drainage noted. Up with therapy today, current plan for  d/c home. Plan for d/c home today vs tomorrow, if no drainage in Prevena can change to honeycomb dressing prior to discharge.  DVT Prophylaxis - TED hose and Eliquis  Weight-Bearing as tolerated to left leg  J. Gustavo Level, PA-C [facility] 07/04/2024, 7:47 AM

## 2024-07-04 NOTE — Progress Notes (Signed)
 Physical Therapy Treatment Patient Details Name: Kristine Rangel MRN: [id number] DOB: June 25, 1956 Today's Date: 07/04/2024   History of Present Illness 68 y/o female patient presents to hospital for elective L THA surgery. She is POD 0 at time of evaluation (07/03/2024).    PT Comments  Pt was long sitting in bed upon arrival. She endorses 8/10 pain however dealing with soft BP so RN holding pain meds at this time. Pt is well known by author from previous admissions. She is agreeable to session and motivated throughout. Pt required increased time + 1 assist to safely exit bed and stand to BRW 2 x EOB. She is incontinent and required max assist for depend/pad to be changed while EOB. Session progressed to taking ~ 5 step to steps form EOB to recliner. No LOB in standing with good wt acceptance on operative LE when advancing non operative leg. Overall pt tolerates session well. Pain limited. She requested to sit in chair until 1pm. Chartered loss adjuster will return at that time for BID/2nd session. DC recs remain appropriate however pt is hopeful to progress throughout the weekend to be able to dc directly home with her personal home aide. Acute PT will continue to follow and progress per current POC.    If plan is discharge home, recommend the following: A little help with walking and/or transfers;A lot of help with bathing/dressing/bathroom;Assistance with cooking/housework;Direct supervision/assist for medications management;Direct supervision/assist for financial management;Assist for transportation;Help with stairs or ramp for entrance     Equipment Recommendations  None recommended by PT (Pt endorses having all needs DME needs met. lift chair, BRW, BBSC, etc.)       Precautions / Restrictions Precautions Precautions: Posterior Hip Precaution Booklet Issued: No (will bring next session) Recall of Precautions/Restrictions: Intact Restrictions Weight Bearing Restrictions Per Provider Order: Yes LLE Weight  Bearing Per Provider Order: Weight bearing as tolerated     Mobility  Bed Mobility Overal bed mobility: Needs Assistance Bed Mobility: Supine to Sit  Supine to sit: Used rails, HOB elevated, Mod assist  General bed mobility comments: Increased time + mod assist required. pt does have lift chair at home and states she is hopeful to retyurn home at DC    Transfers Overall transfer level: Needs assistance Equipment used: Rolling walker (2 wheels) (Bariatric) Transfers: Sit to/from Stand Sit to Stand: Min assist, Mod assist, From elevated surface Step pivot transfers: Min assist, Mod assist, From elevated surface  General transfer comment: Pt stood EOB with +1 min-mod assist. Shoes donned prior as requested. Vcs for handplacement and technique. Pt is incontinent. author assisted with changing pt's personal depends/pad while standing EOB    Ambulation/Gait Ambulation/Gait assistance: Min assist Gait Distance (Feet): 5 Feet Assistive device: Rolling walker (2 wheels) (Bariatric) Gait Pattern/deviations: Step-to pattern Gait velocity: decreased  General Gait Details: Pt was able to take ~ 5 steps form EOB to recliner. no LOB. slow antalgic step to gait however seems to accept wt on LLE well. pain limiting distance. pt currently with soft BP and unable to recieve pain meds at this time    Balance Overall balance assessment: Needs assistance Sitting-balance support: Feet supported Sitting balance-Leahy Scale: Good Sitting balance - Comments: no LOB whiel seated EOB with feet supported on floor   Standing balance support: Bilateral upper extremity supported, During functional activity, Reliant on assistive device for balance Standing balance-Leahy Scale: Fair Standing balance comment: no LOB in standing with BUE support on RW    Communication Communication Communication: No apparent difficulties  Cognition Arousal: Alert Behavior During Therapy: WFL for tasks assessed/performed   PT  - Cognitive impairments: No apparent impairments    PT - Cognition Comments: Pt is A&Ox4. She is pleasant and agreeable to therapy. Following commands: Intact      Cueing Cueing Techniques: Verbal cues     General Comments General comments (skin integrity, edema, etc.): Applied ice pack to LLE as requested. Reviewed pt's home set up and available assistance at home. pt has personal care aide who assist her daily      Pertinent Vitals/Pain Pain Assessment Pain Assessment: 0-10 Pain Score: 8  Pain Location: L hip Pain Descriptors / Indicators: Grimacing Pain Intervention(s): Limited activity within patient's tolerance, Monitored during session, Repositioned, Ice applied     PT Goals (current goals can now be found in the care plan section) Acute Rehab PT Goals Patient Stated Goal: get well so I can go home Progress towards PT goals: Progressing toward goals    Frequency    BID       AM-PAC PT 6 Clicks Mobility   Outcome Measure  Help needed turning from your back to your side while in a flat bed without using bedrails?: A Little Help needed moving from lying on your back to sitting on the side of a flat bed without using bedrails?: A Lot Help needed moving to and from a bed to a chair (including a wheelchair)?: A Lot Help needed standing up from a chair using your arms (e.g., wheelchair or bedside chair)?: A Lot Help needed to walk in hospital room?: A Little Help needed climbing 3-5 steps with a railing? : A Lot 6 Click Score: 14    End of Session   Activity Tolerance: Patient tolerated treatment well Patient left: in chair;with nursing/sitter in room;with call bell/phone within reach Nurse Communication: Mobility status;Patient requests pain meds PT Visit Diagnosis: Unsteadiness on feet (R26.81);Muscle weakness (generalized) (M62.81);Difficulty in walking, not elsewhere classified (R26.2)     Time: 8997-8972 PT Time Calculation (min) (ACUTE ONLY): 25  min  Charges:    $Therapeutic Activity: 23-37 mins PT General Charges $$ ACUTE PT VISIT: 1 Visit                    Rankin Essex PTA 07/04/24, 10:40 AM

## 2024-07-04 NOTE — Plan of Care (Signed)

## 2024-07-04 NOTE — Anesthesia Postprocedure Evaluation (Signed)
 Anesthesia Post Note  Patient: Kristine Rangel  Procedure(s) Performed: ARTHROPLASTY, HIP, TOTAL,POSTERIOR APPROACH (Left: Hip)  Patient location during evaluation: Nursing Unit Anesthesia Type: Spinal Level of consciousness: oriented and awake and alert Pain management: pain level controlled Vital Signs Assessment: post-procedure vital signs reviewed and stable Respiratory status: spontaneous breathing and respiratory function stable Cardiovascular status: blood pressure returned to baseline and stable Postop Assessment: no headache, no backache, no apparent nausea or vomiting and patient able to bend at knees Anesthetic complications: no   No notable events documented.   Last Vitals:  Vitals:   07/04/24 0832 07/04/24 1133  BP: (!) 99/54 128/65  Pulse: 63   Resp: 17   Temp: 37.1 C   SpO2: 100%     Last Pain:  Vitals:   07/04/24 1208  TempSrc:   PainSc: 5                  Tirso Laws

## 2024-07-04 NOTE — Discharge Instructions (Signed)
Instructions after Total Hip Replacement     J. Derald Macleod, M.D.  Valeria Batman, PA-C     Dept. of Orthopaedics & Sports Medicine  [facility]  [address]  [place], [place]  [address]  Phone: [phone]   Fax: [phone]    DIET: Drink plenty of non-alcoholic fluids. Resume your normal diet. Include foods high in fiber.  ACTIVITY:  You may use crutches or a walker with weight-bearing as tolerated, unless instructed otherwise. You may be weaned off of the walker or crutches by your Physical Therapist.  Do NOT reach below the level of your knees or cross your legs until allowed.    Continue doing gentle exercises. Exercising will reduce the pain and swelling, increase motion, and prevent muscle weakness.   Please continue to use the TED compression stockings for 6 weeks. You may remove the stockings at night, but should reapply them in the morning. Do not drive or operate any equipment until instructed.  WOUND CARE:  Continue to use ice packs periodically to reduce pain and swelling. Keep the incision clean and dry. You may bathe or shower after the staples are removed at the first office visit following surgery.  MEDICATIONS: You may resume your regular medications. Please take the pain medication as prescribed on the medication. Do not take pain medication on an empty stomach. You have been given a prescription for a blood thinner to prevent blood clots. Please take the medication as instructed. (NOTE: After completing a 2 week course of Eliquis, take one Enteric-coated aspirin once a day.) Pain medications and iron supplements can cause constipation. Use a stool softener (Senokot or Colace) on a daily basis and a laxative (dulcolax or miralax) as needed. Do not drive or drink alcoholic beverages when taking pain medications.  CALL THE OFFICE FOR: Temperature above 101 degrees Excessive bleeding or drainage on the dressing. Excessive swelling, coldness, or  paleness of the toes. Persistent nausea and vomiting.  FOLLOW-UP:  You should have an appointment to return to the office in 2 weeks after surgery. Arrangements have been made for continuation of Physical Therapy (either home therapy or outpatient therapy).

## 2024-07-05 DIAGNOSIS — M1612 Unilateral primary osteoarthritis, left hip: Secondary | ICD-10-CM | POA: Diagnosis not present

## 2024-07-05 LAB — CBC
HCT: 31.1 % — ABNORMAL LOW (ref 36.0–46.0)
Hemoglobin: 10.2 g/dL — ABNORMAL LOW (ref 12.0–15.0)
MCH: 26.8 pg (ref 26.0–34.0)
MCHC: 32.8 g/dL (ref 30.0–36.0)
MCV: 81.8 fL (ref 80.0–100.0)
Platelets: 300 K/uL (ref 150–400)
RBC: 3.8 MIL/uL — ABNORMAL LOW (ref 3.87–5.11)
RDW: 15.8 % — ABNORMAL HIGH (ref 11.5–15.5)
WBC: 13.2 K/uL — ABNORMAL HIGH (ref 4.0–10.5)
nRBC: 0 % (ref 0.0–0.2)

## 2024-07-05 LAB — GLUCOSE, CAPILLARY
Glucose-Capillary: 102 mg/dL — ABNORMAL HIGH (ref 70–99)
Glucose-Capillary: 109 mg/dL — ABNORMAL HIGH (ref 70–99)
Glucose-Capillary: 115 mg/dL — ABNORMAL HIGH (ref 70–99)
Glucose-Capillary: 127 mg/dL — ABNORMAL HIGH (ref 70–99)

## 2024-07-05 NOTE — Progress Notes (Signed)
 Physical Therapy Treatment Patient Details Name: Kristine Rangel MRN: [id number] DOB: 10-16-1956 Today's Date: 07/05/2024   History of Present Illness 68 y/o female patient presents to hospital for elective L THA surgery. She is POD 0 at time of evaluation (07/03/2024).    PT Comments  Pt received up in chair after lunch and requesting to return to bed. ModA to raise from recliner and after using BSC. Pt has an elevating recliner at home. Gait training in room 10' x 2 with Bariatric RW and CGA. 8/10 c/o L hip pain with mobility, pain meds given 10 minutes prior to PT session. Assist given for B LE's for sit > supine back in bed. Discussed pt's d/c request at length. Pt wishes to d/c to caregiver's home tomorrow am in [place] where she stayed earlier this year after having R THA. All DME needs previously provided. HHPT would be beneficial as ordered on last d/c. Will continue to progress per POC.    If plan is discharge home, recommend the following: A little help with walking and/or transfers;A lot of help with bathing/dressing/bathroom;Assistance with cooking/housework;Direct supervision/assist for medications management;Direct supervision/assist for financial management;Assist for transportation;Help with stairs or ramp for entrance   Can travel by private vehicle     Yes  Equipment Recommendations  None recommended by PT (Pt has all needed DME at home)    Recommendations for Other Services       Precautions / Restrictions Precautions Precautions: Posterior Hip Precaution Booklet Issued: Yes (comment) (Reviewed) Recall of Precautions/Restrictions: Intact Restrictions Weight Bearing Restrictions Per Provider Order: Yes LLE Weight Bearing Per Provider Order: Weight bearing as tolerated     Mobility  Bed Mobility Overal bed mobility: Needs Assistance Bed Mobility: Sit to Supine     Supine to sit: Mod assist, HOB elevated, Used rails Sit to supine: Mod assist, Used rails (ModA  for B LE's)   General bed mobility comments: Increased time and struggle to transition to EOB with heavy reliance on bed rails    Transfers Overall transfer level: Needs assistance Equipment used: Rolling walker (2 wheels) Transfers: Sit to/from Stand Sit to Stand: Mod assist           General transfer comment:  (ModA to stand from Recliner and BSC. Pt has an elevating recliner at home)    Ambulation/Gait Ambulation/Gait assistance: Contact guard assist Gait Distance (Feet): 10 Feet Assistive device: Rolling walker (2 wheels) (Bariatric) Gait Pattern/deviations: Step-to pattern, Decreased weight shift to left, Antalgic, Wide base of support Gait velocity: decreased     General Gait Details: 10' x 2   Stairs             Wheelchair Mobility     Tilt Bed    Modified Rankin (Stroke Patients Only)       Balance Overall balance assessment: Needs assistance Sitting-balance support: Feet supported Sitting balance-Leahy Scale: Good Sitting balance - Comments: no LOB while seated EOB with feet supported on floor   Standing balance support: Bilateral upper extremity supported, During functional activity, Reliant on assistive device for balance Standing balance-Leahy Scale: Fair Standing balance comment: no LOB in standing with BUE support on RW                            Communication Communication Communication: No apparent difficulties  Cognition Arousal: Alert Behavior During Therapy: WFL for tasks assessed/performed   PT - Cognitive impairments: No apparent impairments  PT - Cognition Comments: Pt is A&Ox4. She is pleasant and agreeable to therapy. continues to plan on DCing directly home form acute admission Following commands: Intact      Cueing Cueing Techniques: Verbal cues  Exercises Total Joint Exercises Ankle Circles/Pumps: AROM, Both, 10 reps Long Arc Quad: AAROM, Left, 10 reps, Seated    General  Comments General comments (skin integrity, edema, etc.):  (L hip portable wound vac intact)      Pertinent Vitals/Pain Pain Assessment Pain Assessment: Faces Faces Pain Scale: Hurts whole lot Pain Location: L hip Pain Descriptors / Indicators: Grimacing, Sore, Operative site guarding Pain Intervention(s): Limited activity within patient's tolerance    Home Living                          Prior Function            PT Goals (current goals can now be found in the care plan section) Acute Rehab PT Goals Patient Stated Goal: get well so I can go home Progress towards PT goals: Progressing toward goals    Frequency    BID      PT Plan      Co-evaluation              AM-PAC PT 6 Clicks Mobility   Outcome Measure  Help needed turning from your back to your side while in a flat bed without using bedrails?: A Little Help needed moving from lying on your back to sitting on the side of a flat bed without using bedrails?: A Lot Help needed moving to and from a bed to a chair (including a wheelchair)?: A Lot Help needed standing up from a chair using your arms (e.g., wheelchair or bedside chair)?: A Lot Help needed to walk in hospital room?: A Little Help needed climbing 3-5 steps with a railing? : A Lot 6 Click Score: 14    End of Session Equipment Utilized During Treatment: Gait belt Activity Tolerance: Patient tolerated treatment well Patient left: in bed;with call bell/phone within reach;with bed alarm set Nurse Communication: Mobility status PT Visit Diagnosis: Unsteadiness on feet (R26.81);Muscle weakness (generalized) (M62.81);Difficulty in walking, not elsewhere classified (R26.2)     Time: 8699-8671 PT Time Calculation (min) (ACUTE ONLY): 28 min  Charges:    $Gait Training: 8-22 mins $Therapeutic Exercise: 8-22 mins $Therapeutic Activity: 8-22 mins PT General Charges $$ ACUTE PT VISIT: 1 Visit                    Kristine Rangel,  PTA  Kristine Rangel 07/05/2024, 1:59 PM

## 2024-07-05 NOTE — Plan of Care (Signed)

## 2024-07-05 NOTE — Progress Notes (Signed)
 Physical Therapy Treatment Patient Details Name: Kristine Rangel MRN: [id number] DOB: 01/30/1956 Today's Date: 07/05/2024   History of Present Illness 68 y/o female patient presents to hospital for elective L THA surgery. She is POD 0 at time of evaluation (07/03/2024).    PT Comments  Pt received in bed, pre-medicated prior to session, motivated to progress mobility. Pt continues to require Mod/MinA for bed mobility with heavy use of bed rails to transition to EOB. Cues to comply with Posterior hip precautions upon standing at Bariatric RW. Increased gait distance to 76ft in room with CGA. Slightly improved tolerance for wt shifting onto L LE. Will continue to progress. Pt continues to state she would like to go directly home from hospital and has multiple caregivers. Will f/u for safe d/c.   If plan is discharge home, recommend the following: A little help with walking and/or transfers;A lot of help with bathing/dressing/bathroom;Assistance with cooking/housework;Direct supervision/assist for medications management;Direct supervision/assist for financial management;Assist for transportation;Help with stairs or ramp for entrance   Can travel by private vehicle     No  Equipment Recommendations  None recommended by PT (Pt has DME at home)    Recommendations for Other Services       Precautions / Restrictions Precautions Precautions: Posterior Hip Precaution Booklet Issued: Yes (comment) (Reviewed with pt and will continue to provide education) Recall of Precautions/Restrictions: Intact Restrictions Weight Bearing Restrictions Per Provider Order: Yes LLE Weight Bearing Per Provider Order: Weight bearing as tolerated     Mobility  Bed Mobility Overal bed mobility: Needs Assistance Bed Mobility: Supine to Sit     Supine to sit: Mod assist, HOB elevated, Used rails     General bed mobility comments: Increased time and struggle to transition to EOB with heavy reliance on bed rails     Transfers Overall transfer level: Needs assistance Equipment used: Rolling walker (2 wheels) (Bariatric) Transfers: Sit to/from Stand Sit to Stand: Min assist, Mod assist, From elevated surface           General transfer comment: Cues for hand placement and technique to power up to standing at RW    Ambulation/Gait Ambulation/Gait assistance: Contact guard assist Gait Distance (Feet): 10 Feet Assistive device: Rolling walker (2 wheels) (Bariatric) Gait Pattern/deviations: Step-to pattern, Decreased weight shift to left, Antalgic, Wide base of support Gait velocity: decreased     General Gait Details: Pt does well during standing activity but struggles to adhere to hip precautions to achieve standing. no LOB with limited gait.   Stairs             Wheelchair Mobility     Tilt Bed    Modified Rankin (Stroke Patients Only)       Balance Overall balance assessment: Needs assistance Sitting-balance support: Feet supported Sitting balance-Leahy Scale: Good Sitting balance - Comments: no LOB while seated EOB with feet supported on floor   Standing balance support: Bilateral upper extremity supported, During functional activity, Reliant on assistive device for balance Standing balance-Leahy Scale: Fair Standing balance comment: no LOB in standing with BUE support on RW                            Communication Communication Communication: No apparent difficulties  Cognition Arousal: Alert Behavior During Therapy: WFL for tasks assessed/performed   PT - Cognitive impairments: No apparent impairments  PT - Cognition Comments: Pt is A&Ox4. She is pleasant and agreeable to therapy. continues to plan on DCing directly home form acute admission Following commands: Intact      Cueing Cueing Techniques: Verbal cues  Exercises Total Joint Exercises Ankle Circles/Pumps: AROM, Both, 10 reps Long Arc Quad: AAROM, Left, 10  reps, Seated    General Comments General comments (skin integrity, edema, etc.): Educated on HEP and hip precautions. Packet left with pt in room.      Pertinent Vitals/Pain Pain Assessment Pain Assessment: Faces Faces Pain Scale: Hurts even more Pain Location: L hip Pain Descriptors / Indicators: Grimacing Pain Intervention(s): Premedicated before session    Home Living                          Prior Function            PT Goals (current goals can now be found in the care plan section) Acute Rehab PT Goals Patient Stated Goal: get well so I can go home Progress towards PT goals: Progressing toward goals    Frequency    BID      PT Plan      Co-evaluation              AM-PAC PT 6 Clicks Mobility   Outcome Measure  Help needed turning from your back to your side while in a flat bed without using bedrails?: A Little Help needed moving from lying on your back to sitting on the side of a flat bed without using bedrails?: A Lot Help needed moving to and from a bed to a chair (including a wheelchair)?: A Lot Help needed standing up from a chair using your arms (e.g., wheelchair or bedside chair)?: A Lot Help needed to walk in hospital room?: A Little Help needed climbing 3-5 steps with a railing? : A Lot 6 Click Score: 14    End of Session Equipment Utilized During Treatment: Gait belt Activity Tolerance: Patient tolerated treatment well Patient left: in chair;with nursing/sitter in room;with call bell/phone within reach Nurse Communication: Mobility status PT Visit Diagnosis: Unsteadiness on feet (R26.81);Muscle weakness (generalized) (M62.81);Difficulty in walking, not elsewhere classified (R26.2)     Time: 9063-8994 PT Time Calculation (min) (ACUTE ONLY): 29 min  Charges:    $Gait Training: 8-22 mins $Therapeutic Exercise: 8-22 mins PT General Charges $$ ACUTE PT VISIT: 1 Visit                    Darice Bohr, PTA  Darice JAYSON Bohr 07/05/2024, 10:43 AM

## 2024-07-05 NOTE — Progress Notes (Signed)
 Subjective: 2 Days Post-Op Procedure(s) (LRB): ARTHROPLASTY, HIP, TOTAL,POSTERIOR APPROACH (Left) Patient reports pain as mild.   Patient is well, and has had no acute complaints or problems PT and care management to assist with d/c planning.  Current plan is to return home, possibly today vs. Tomorrow, may need SNF. Negative for chest pain and shortness of breath Fever: no Gastrointestinal:Negative for nausea and vomiting   Objective: Vital signs in last 24 hours: Temp:  [98 F (36.7 C)-98.4 F (36.9 C)] 98.4 F (36.9 C) (08/23 0749) Pulse Rate:  [65-72] 66 (08/23 0749) Resp:  [17-18] 17 (08/23 0749) BP: (110-151)/(52-85) 151/54 (08/23 0749) SpO2:  [98 %-100 %] 100 % (08/23 0749)  Intake/Output from previous day:  Intake/Output Summary (Last 24 hours) at 07/05/2024 0836 Last data filed at 07/04/2024 1900 Gross per 24 hour  Intake 480 ml  Output --  Net 480 ml    Intake/Output this shift: No intake/output data recorded.  Labs: Recent Labs    07/04/24 0443 07/05/24 0348  HGB 10.1* 10.2*   Recent Labs    07/04/24 0443 07/05/24 0348  WBC 14.3* 13.2*  RBC 3.79* 3.80*  HCT 31.5* 31.1*  PLT 300 300   Recent Labs    07/04/24 0443  NA 138  K 4.2  CL 108  CO2 24  BUN 18  CREATININE 1.05*  GLUCOSE 139*  CALCIUM  9.3   No results for input(s): LABPT, INR in the last 72 hours.   EXAM General - Patient is Alert, Appropriate, and Oriented Extremity - ABD soft Neurovascular intact Dorsiflexion/Plantar flexion intact Incision: Prevena intact without drainage. No cellulitis present Compartment soft Dressing/Incision - Prevena sponge intact with good seal.  No drainage in canister Motor Function - intact, moving foot and toes well on exam.  Abdomen soft with intact bowel sounds.  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   Bladder leak    Cardiac murmur    Chest pain    a.) 10/07/2008 Neg Dobuatmine Echo; b.) LHC 09/11/2011 - minor lum irregs; no sig CAD; c.) MV 06/02/2015: no ischemia; d.) cCTA 02/2021: Ca2+ = 0. Normal coronaries   CKD (chronic kidney disease), stage III (HCC)    COPD (chronic obstructive pulmonary disease) (HCC)    Depression    Diet-controlled type 2 diabetes mellitus (HCC)    Dyspnea    Essential hypertension    GERD (gastroesophageal reflux disease)    Glaucoma    History of kidney stones    History of left heart catheterization (LHC) 09/11/2011   a.) LHC 09/11/2011: minor luminal irregulaties mLAD, mLCx, mRCA - med mgmt.   Hyperlipidemia    Insomnia    a.) takes suvorexant    MI (myocardial infarction) (HCC)    a.) in the 1990s; details unclear   Migraine with aura    Neuropathy    On apixaban  therapy    OSA on CPAP    Primary osteoarthritis  of left hip    Rotator cuff injury    Seasonal allergies    Stroke [facility])    TIA   Tendinitis of right rotator cuff    TIA (transient ischemic attack)     Assessment/Plan: 2 Days Post-Op Procedure(s) (LRB): ARTHROPLASTY, HIP, TOTAL,POSTERIOR APPROACH (Left) Principal Problem:   Status post total hip replacement, left  Estimated body mass index is 47.96 kg/m as calculated from the following:   Height as of 06/26/24: 5' 3 (1.6 m).   Weight as of 06/26/24: 122.8 kg. Advance diet Up with therapy  Labs and vital signs are stable Pain well-controlled Slow progress with physical therapy. Care management to assist with discharge to home with home  health PT.  Possible discharge to home today if good progress with physical therapy.  If continued struggles with physical therapy patient may need skilled nursing facility.   DVT Prophylaxis - TED hose and Eliquis  Weight-Bearing as tolerated to left leg  T. Medford Amber, PA-C [facility] 07/05/2024, 8:36 AM

## 2024-07-05 NOTE — TOC Initial Note (Signed)
 Transition of Care [facility]) - Initial/Assessment Note    Patient Details  Name: Kristine Rangel MRN: [id number] Date of Birth: 1955-12-11  [facility] of [facility] CM/SW Contact:    [place] L Troi Bechtold, LCSW Phone Number: 07/05/2024, 4:20 PM  Clinical Narrative:                  CSW met with patient. Physician at bedside. CSW advised that patient would like to discharge home with home health. She reports discharging to [place] where she would receive services to assist her.   Awaiting disposition from PT. Home health set up by surgeon's office prior to surgery. [facility] will be providing HH.    1:49pm: Secure chat received from PTA:   Hi there, not sure if anyone is following her Hospitalist wise, but she is planning to d/c to caregivers home in [place] where she went after her Right hip surgery earlier this year. She has outstanding billing claim with [facility] and was unable to go last time. Her caregiver has all the DME needed and competent to care for her. She can pick her up tomorrow am and hopefully receive HHPT as she previously did. If medically cleared .        Patient Goals and CMS Choice            Expected Discharge Plan and Services                                              Prior Living Arrangements/Services                       Activities of Daily Living      Permission Sought/Granted                  Emotional Assessment              Admission diagnosis:  Primary osteoarthritis of left hip [M16.12] Status post total hip replacement, left [Z96.642] Patient Active Problem List   Diagnosis Date Noted   Status post total hip replacement, left 07/03/2024   Status post total hip replacement, right 02/21/2024   Right sided weakness 05/15/2022   GERD (gastroesophageal reflux disease) 05/15/2022   Peripheral neuropathy 05/15/2022   Hyperlipidemia associated with type 2 diabetes mellitus (HCC) 05/15/2022   OSA (obstructive sleep  apnea) 05/15/2022   Chronic heart failure with preserved ejection fraction (HFpEF) (HCC) 05/15/2022   Asthma, chronic 05/15/2022   Chronic kidney disease, stage 3a (HCC) 05/15/2022   Chest pain 02/05/2021   SOB (shortness of breath) 02/05/2021   Leg swelling 02/05/2021   Nonrheumatic mitral valve regurgitation 02/05/2021   Morbid obesity (HCC) 02/05/2021   S/P revision of total knee, 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 [place] [address] Phone: [phone] Fax: [phone]     Social Drivers of Health (SDOH) Social History: SDOH Screenings   Food Insecurity: Food Insecurity Present (07/03/2024)  Housing: High Risk (07/03/2024)  Transportation Needs: Unmet Transportation Needs (07/03/2024)  Utilities: Not At Risk (07/03/2024)  Financial Resource Strain: Low Risk  (11/30/2023)   Received from [facility]  Social Connections: Moderately Integrated (07/03/2024)  Tobacco Use: High Risk (07/03/2024)   SDOH Interventions:     Readmission Risk Interventions     No data to display

## 2024-07-06 DIAGNOSIS — M1612 Unilateral primary osteoarthritis, left hip: Secondary | ICD-10-CM | POA: Diagnosis not present

## 2024-07-06 LAB — GLUCOSE, CAPILLARY
Glucose-Capillary: 109 mg/dL — ABNORMAL HIGH (ref 70–99)
Glucose-Capillary: 107 mg/dL — ABNORMAL HIGH (ref 70–99)
Glucose-Capillary: 113 mg/dL — ABNORMAL HIGH (ref 70–99)
Glucose-Capillary: 131 mg/dL — ABNORMAL HIGH (ref 70–99)

## 2024-07-06 MED ORDER — MAGNESIUM HYDROXIDE 400 MG/5ML PO SUSP
30.0000 mL | Freq: Once | ORAL | Status: AC
  Administered 2024-07-06: 30 mL via ORAL
  Filled 2024-07-06: qty 30

## 2024-07-06 MED ORDER — FLEET ENEMA RE ENEM: 1.0000 | ENEMA | Freq: Once | RECTAL | Status: DC

## 2024-07-06 MED ORDER — SENNOSIDES-DOCUSATE SODIUM 8.6-50 MG PO TABS
1.0000 | ORAL_TABLET | Freq: Once | ORAL | Status: AC
  Administered 2024-07-06: 1 via ORAL
  Filled 2024-07-06: qty 1

## 2024-07-06 MED ORDER — ORAL CARE MOUTH RINSE: 15.0000 mL | OROMUCOSAL | Status: DC | PRN

## 2024-07-06 NOTE — Progress Notes (Signed)
 Subjective: 3 Days Post-Op Procedure(s) (LRB): ARTHROPLASTY, HIP, TOTAL,POSTERIOR APPROACH (Left) Patient reports pain as mild.   Patient is well, and has had no acute complaints or problems PT and care management to assist with d/c planning.  Current plan is to return home, possibly today if good progress with afternoon PT Negative for chest pain and shortness of breath Fever: no Gastrointestinal:Negative for nausea and vomiting Needs BM. Passing gas. Tolerating po well   Objective: Vital signs in last 24 hours: Temp:  [97.6 F (36.4 C)-98.6 F (37 C)] 98.6 F (37 C) (08/24 0813) Pulse Rate:  [63-78] 63 (08/24 0813) Resp:  [15-18] 16 (08/24 0813) BP: (95-122)/(38-60) 95/38 (08/24 0813) SpO2:  [99 %-100 %] 100 % (08/24 0813)  Intake/Output from previous day:  Intake/Output Summary (Last 24 hours) at 07/06/2024 1216 Last data filed at 07/06/2024 0900 Gross per 24 hour  Intake 780 ml  Output --  Net 780 ml    Intake/Output this shift: Total I/O In: 600 [P.O.:600] Out: -   Labs: Recent Labs    07/04/24 0443 07/05/24 0348  HGB 10.1* 10.2*   Recent Labs    07/04/24 0443 07/05/24 0348  WBC 14.3* 13.2*  RBC 3.79* 3.80*  HCT 31.5* 31.1*  PLT 300 300   Recent Labs    07/04/24 0443  NA 138  K 4.2  CL 108  CO2 24  BUN 18  CREATININE 1.05*  GLUCOSE 139*  CALCIUM  9.3   No results for input(s): LABPT, INR in the last 72 hours.   EXAM General - Patient is Alert, Appropriate, and Oriented Extremity - ABD soft Neurovascular intact Dorsiflexion/Plantar flexion intact Incision: Prevena intact without drainage. No cellulitis present Compartment soft Dressing/Incision - Prevena sponge intact with good seal.  No drainage in canister Motor Function - intact, moving foot and toes well on exam.  Abdomen soft with intact bowel sounds.  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   Bladder leak    Cardiac murmur    Chest pain    a.) 10/07/2008 Neg Dobuatmine Echo; b.) LHC 09/11/2011 - minor lum irregs; no sig CAD; c.) MV 06/02/2015: no ischemia; d.) cCTA 02/2021: Ca2+ = 0. Normal coronaries   CKD (chronic kidney disease), stage III (HCC)    COPD (chronic obstructive pulmonary disease) (HCC)    Depression    Diet-controlled type 2 diabetes mellitus (HCC)    Dyspnea    Essential hypertension    GERD (gastroesophageal reflux disease)    Glaucoma    History of kidney stones    History of left heart catheterization (LHC) 09/11/2011   a.) LHC 09/11/2011: minor luminal irregulaties mLAD, mLCx, mRCA - med mgmt.   Hyperlipidemia    Insomnia    a.) takes suvorexant    MI (myocardial infarction) (HCC)    a.) in the 1990s; details unclear   Migraine with aura    Neuropathy    On apixaban   therapy    OSA on CPAP    Primary osteoarthritis of left hip    Rotator cuff injury    Seasonal allergies    Stroke [facility])    TIA   Tendinitis of right rotator cuff    TIA (transient ischemic attack)     Assessment/Plan: 3 Days Post-Op Procedure(s) (LRB): ARTHROPLASTY, HIP, TOTAL,POSTERIOR APPROACH (Left) Principal Problem:   Status post total hip replacement, left  Estimated body mass index is 47.96 kg/m as calculated from the following:   Height as of 06/26/24: 5' 3 (1.6 m).   Weight as of 06/26/24: 122.8 kg. Advance diet Up with therapy  vital signs are stable Pain well-controlled Work on BM Slow progress with physical  therapy. Care management to assist with discharge to home with home health PT.  Possible discharge to home today if good progress with PM physical therapy.  If continued struggles with physical therapy patient may need skilled nursing facility.   DVT Prophylaxis - TED hose and Eliquis  Weight-Bearing as tolerated to left leg  T. Medford Amber, PA-C [facility] 07/06/2024, 12:16 PM

## 2024-07-06 NOTE — Progress Notes (Addendum)
 Physical Therapy Treatment Patient Details Name: Kristine Rangel MRN: [id number] DOB: [birth year] Today's Date: 07/06/2024   History of Present Illness 68 y/o female patient presents to hospital for elective L THA surgery. She is POD 0 at time of evaluation (07/03/2024).    PT Comments  Pt ready for session.  She requires mod a x 1 to EOB with bed features. Stated she will have electric hospital bed at discharge.  She is steady in sitting but does require mod a x 1 from elevated surface.  Struggles to walk 8' today with heavy reliance on RW  She fatigues and needs visitor chair to sit.  After seated rest, needs min/mod a x 2 to stand from armless chair.  Takes 2 steps then stated she did not have her sneakers on and that was affecting her gait.  Sat and shoes donned with max a.  She is able to complete the last 6' back to recliner but gait remains poor and generally cumbersome.  She does need increased cues for walker position today as she continually steps too far up into walker box. Remained in chair with ice applied.    Plan was for DC home this am.  Pt is struggling a bit more today despite max effort and motivation.  Stated she has one small threshold step into home that given gait quality would be precarious this am.  Will plan to see pt again at 1:00 in hopes of improvement.  Will discuss with ortho PA.     If plan is discharge home, recommend the following: A little help with walking and/or transfers;A lot of help with bathing/dressing/bathroom;Assistance with cooking/housework;Direct supervision/assist for medications management;Direct supervision/assist for financial management;Assist for transportation;Help with stairs or ramp for entrance   Can travel by private vehicle     Yes  Equipment Recommendations  None recommended by PT (Pt has all needed DME at home)    Recommendations for Other Services       Precautions / Restrictions Precautions Precautions: Posterior Hip Precaution  Booklet Issued: Yes (comment) (Reviewed) Recall of Precautions/Restrictions: Intact Restrictions Weight Bearing Restrictions Per Provider Order: Yes LLE Weight Bearing Per Provider Order: Weight bearing as tolerated     Mobility  Bed Mobility Overal bed mobility: Needs Assistance Bed Mobility: Supine to Sit     Supine to sit: Mod assist, HOB elevated, Used rails       Patient Response: Cooperative  Transfers Overall transfer level: Needs assistance Equipment used: Rolling walker (2 wheels)   Sit to Stand: Mod assist, From elevated surface                Ambulation/Gait Ambulation/Gait assistance: Contact guard assist, Min assist Gait Distance (Feet): 8 Feet Assistive device: Rolling walker (2 wheels) Gait Pattern/deviations: Step-to pattern, Decreased weight shift to left, Antalgic, Wide base of support Gait velocity: decreased     General Gait Details: 8', 2', 6'   Stairs             Wheelchair Mobility     Tilt Bed Tilt Bed Patient Response: Cooperative  Modified Rankin (Stroke Patients Only)       Balance Overall balance assessment: Needs assistance Sitting-balance support: Feet supported Sitting balance-Leahy Scale: Good     Standing balance support: Bilateral upper extremity supported, During functional activity, Reliant on assistive device for balance Standing balance-Leahy Scale: Fair Standing balance comment: heavy reliance on RW for support  Communication    Cognition Arousal: Alert Behavior During Therapy: WFL for tasks assessed/performed   PT - Cognitive impairments: No apparent impairments                         Following commands: Intact      Cueing Cueing Techniques: Verbal cues  Exercises      General Comments        Pertinent Vitals/Pain Pain Assessment Pain Assessment: Faces Faces Pain Scale: Hurts whole lot Pain Descriptors / Indicators: Grimacing, Sore,  Operative site guarding Pain Intervention(s): Limited activity within patient's tolerance, Monitored during session, Premedicated before session, Ice applied    Home Living                          Prior Function            PT Goals (current goals can now be found in the care plan section) Progress towards PT goals: Progressing toward goals    Frequency    BID      PT Plan      Co-evaluation              AM-PAC PT 6 Clicks Mobility   Outcome Measure  Help needed turning from your back to your side while in a flat bed without using bedrails?: A Little Help needed moving from lying on your back to sitting on the side of a flat bed without using bedrails?: A Lot Help needed moving to and from a bed to a chair (including a wheelchair)?: A Lot Help needed standing up from a chair using your arms (e.g., wheelchair or bedside chair)?: A Lot Help needed to walk in hospital room?: A Little Help needed climbing 3-5 steps with a railing? : A Lot 6 Click Score: 14    End of Session Equipment Utilized During Treatment: Gait belt Activity Tolerance: Patient tolerated treatment well Patient left: with call bell/phone within reach;in chair;with chair alarm set Nurse Communication: Mobility status PT Visit Diagnosis: Unsteadiness on feet (R26.81);Muscle weakness (generalized) (M62.81);Difficulty in walking, not elsewhere classified (R26.2)     Time: 9154-9086 PT Time Calculation (min) (ACUTE ONLY): 28 min  Charges:    $Gait Training: 23-37 mins PT General Charges $$ ACUTE PT VISIT: 1 Visit                   Lauraine Gills, PTA 07/06/24, 9:23 AM

## 2024-07-06 NOTE — Progress Notes (Signed)
 Physical Therapy Treatment Patient Details Name: Kristine Rangel MRN: [id number] DOB: Oct 22, 1956 Today's Date: 07/06/2024   History of Present Illness 68 y/o female patient presents to hospital for elective L THA surgery. She is POD 0 at time of evaluation (07/03/2024).    PT Comments  Pt ready for extra BID session today to assist with discharge planning and transition home.  Pre-medicated about 30 minutes prior.  She needs assist to don shoes.  Mod a x 1 for bed mobility with bed features.  She stands from elevated bed with mod a x 1 and is able to walk about 10' this session before needing to turn where she begins to void.  BSC is obtained and she is assisted to sitting and pad changes.  Pt wears double briefs and pads which she brings from home.  She is able to stand and walk 8' back to bed before opting to return to supine.    Pt did somewhat better this pm but gait remains quite limited.  Stated she does not have access to wheelchair at home and will need to walk in/out of home.  One small threshold step.  Discussed with PA and will see pt again in AM for continued mobility.   If plan is discharge home, recommend the following: A little help with walking and/or transfers;A lot of help with bathing/dressing/bathroom;Assistance with cooking/housework;Direct supervision/assist for medications management;Direct supervision/assist for financial management;Assist for transportation;Help with stairs or ramp for entrance   Can travel by private vehicle        Equipment Recommendations  None recommended by PT    Recommendations for Other Services       Precautions / Restrictions Precautions Precautions: Posterior Hip Precaution Booklet Issued: Yes (comment) (Reviewed) Recall of Precautions/Restrictions: Intact Restrictions Weight Bearing Restrictions Per Provider Order: Yes LLE Weight Bearing Per Provider Order: Weight bearing as tolerated     Mobility  Bed Mobility Overal bed mobility:  Needs Assistance Bed Mobility: Supine to Sit, Sit to Supine     Supine to sit: Mod assist, HOB elevated, Used rails Sit to supine: Mod assist, Used rails     Patient Response: Cooperative  Transfers Overall transfer level: Needs assistance Equipment used: Rolling walker (2 wheels) Transfers: Sit to/from Stand Sit to Stand: Mod assist, From elevated surface                Ambulation/Gait Ambulation/Gait assistance: Contact guard assist, Min assist Gait Distance (Feet): 10 Feet Assistive device: Rolling walker (2 wheels) Gait Pattern/deviations: Step-to pattern, Decreased weight shift to left, Antalgic, Wide base of support Gait velocity: decreased     General Gait Details: 10' x 1, 8' x 1   Stairs             Wheelchair Mobility     Tilt Bed Tilt Bed Patient Response: Cooperative  Modified Rankin (Stroke Patients Only)       Balance Overall balance assessment: Needs assistance Sitting-balance support: Feet supported Sitting balance-Leahy Scale: Good     Standing balance support: Bilateral upper extremity supported, During functional activity, Reliant on assistive device for balance Standing balance-Leahy Scale: Fair Standing balance comment: heavy reliance on RW for support                            Communication Communication Communication: No apparent difficulties  Cognition Arousal: Alert Behavior During Therapy: WFL for tasks assessed/performed   PT - Cognitive impairments: No apparent impairments  Following commands: Intact      Cueing Cueing Techniques: Verbal cues  Exercises Other Exercises Other Exercises: to [facility] to void    General Comments        Pertinent Vitals/Pain Pain Assessment Pain Assessment: Faces Faces Pain Scale: Hurts even more Pain Location: L hip Pain Descriptors / Indicators: Grimacing, Sore, Operative site guarding Pain Intervention(s): Limited activity  within patient's tolerance, Monitored during session, Premedicated before session    Home Living                          Prior Function            PT Goals (current goals can now be found in the care plan section) Progress towards PT goals: Progressing toward goals    Frequency    BID      PT Plan      Co-evaluation              AM-PAC PT 6 Clicks Mobility   Outcome Measure  Help needed turning from your back to your side while in a flat bed without using bedrails?: A Little Help needed moving from lying on your back to sitting on the side of a flat bed without using bedrails?: A Lot Help needed moving to and from a bed to a chair (including a wheelchair)?: A Lot Help needed standing up from a chair using your arms (e.g., wheelchair or bedside chair)?: A Lot Help needed to walk in hospital room?: A Little Help needed climbing 3-5 steps with a railing? : A Lot 6 Click Score: 14    End of Session Equipment Utilized During Treatment: Gait belt Activity Tolerance: Patient tolerated treatment well Patient left: in bed;with bed alarm set;with call bell/phone within reach Nurse Communication: Mobility status PT Visit Diagnosis: Unsteadiness on feet (R26.81);Muscle weakness (generalized) (M62.81);Difficulty in walking, not elsewhere classified (R26.2)     Time: 8581-8554 PT Time Calculation (min) (ACUTE ONLY): 27 min  Charges:    $Gait Training: 23-37 mins PT General Charges $$ ACUTE PT VISIT: 1 Visit                    Lauraine Gills, PTA 07/06/24, 3:10 PM

## 2024-07-07 DIAGNOSIS — M1612 Unilateral primary osteoarthritis, left hip: Secondary | ICD-10-CM | POA: Diagnosis not present

## 2024-07-07 LAB — GLUCOSE, CAPILLARY
Glucose-Capillary: 88 mg/dL (ref 70–99)
Glucose-Capillary: 92 mg/dL (ref 70–99)

## 2024-07-07 MED ORDER — LACTULOSE 10 GM/15ML PO SOLN
20.0000 g | Freq: Every day | ORAL | Status: DC
  Filled 2024-07-07: qty 30

## 2024-07-07 NOTE — Progress Notes (Signed)
 Subjective: 4 Days Post-Op Procedure(s) (LRB): ARTHROPLASTY, HIP, TOTAL,POSTERIOR APPROACH (Left) Patient reports pain as mild in the lef thip. Patient is well, and has had no acute complaints or problems PT and care management to assist with d/c planning.  Current plan is to return home, possibly today if good progress with  PT Negative for chest pain and shortness of breath Fever: no Gastrointestinal:Negative for nausea and vomiting Needs BM. Passing gas. Tolerating po well  Objective: Vital signs in last 24 hours: Temp:  [97.9 F (36.6 C)-98.6 F (37 C)] 97.9 F (36.6 C) (08/25 0357) Pulse Rate:  [63-70] 64 (08/25 0357) Resp:  [16-18] 18 (08/25 0357) BP: (95-124)/(38-76) 118/60 (08/25 0357) SpO2:  [99 %-100 %] 99 % (08/25 0357)  Intake/Output from previous day:  Intake/Output Summary (Last 24 hours) at 07/07/2024 0747 Last data filed at 07/06/2024 1700 Gross per 24 hour  Intake 1020 ml  Output --  Net 1020 ml    Intake/Output this shift: No intake/output data recorded.  Labs: Recent Labs    07/05/24 0348  HGB 10.2*   Recent Labs    07/05/24 0348  WBC 13.2*  RBC 3.80*  HCT 31.1*  PLT 300   No results for input(s): NA, K, CL, CO2, BUN, CREATININE, GLUCOSE, CALCIUM  in the last 72 hours.  No results for input(s): LABPT, INR in the last 72 hours.   EXAM General - Patient is Alert, Appropriate, and Oriented Extremity - ABD soft Neurovascular intact Dorsiflexion/Plantar flexion intact Incision: Prevena intact without drainage. No cellulitis present Compartment soft Dressing/Incision - Prevena sponge intact with good seal.  No drainage in canister Motor Function - intact, moving foot and toes well on exam.  Abdomen soft with intact bowel sounds.  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   Bladder leak    Cardiac murmur    Chest pain    a.) 10/07/2008 Neg Dobuatmine Echo; b.) LHC 09/11/2011 - minor lum irregs; no sig CAD; c.) MV 06/02/2015: no ischemia; d.) cCTA 02/2021: Ca2+ = 0. Normal coronaries   CKD (chronic kidney disease), stage III (HCC)    COPD (chronic obstructive pulmonary disease) (HCC)    Depression    Diet-controlled type 2 diabetes mellitus (HCC)    Dyspnea    Essential hypertension    GERD (gastroesophageal reflux disease)    Glaucoma    History of kidney stones    History of left heart catheterization (LHC) 09/11/2011   a.) LHC 09/11/2011: minor luminal irregulaties mLAD, mLCx, mRCA - med mgmt.   Hyperlipidemia    Insomnia    a.) takes suvorexant    MI (myocardial infarction) (HCC)    a.) in the 1990s; details unclear   Migraine with aura    Neuropathy    On apixaban  therapy    OSA on CPAP    Primary osteoarthritis of left hip    Rotator cuff injury    Seasonal allergies  Stroke [facility])    TIA   Tendinitis of right rotator cuff    TIA (transient ischemic attack)     Assessment/Plan: 4 Days Post-Op Procedure(s) (LRB): ARTHROPLASTY, HIP, TOTAL,POSTERIOR APPROACH (Left) Principal Problem:   Status post total hip replacement, left  Estimated body mass index is 47.96 kg/m as calculated from the following:   Height as of 06/26/24: 5' 3 (1.6 m).   Weight as of 06/26/24: 122.8 kg. Advance diet Up with therapy  Vital signs are stable Pain well-controlled Work on BM, reports she is passing gas.  Will add lactulose  to regimen today. Slow progress with physical therapy.   Possibly d/c home today pending improvement with PT, if she still struggles with PT will begin process for SNF.  DVT Prophylaxis - TED hose and Eliquis  Weight-Bearing as tolerated to left leg  J. Gustavo Level, PA-C, CAQ-OS [facility] 07/07/2024, 7:47 AM

## 2024-07-07 NOTE — Care Management Obs Status (Signed)
 MEDICARE OBSERVATION STATUS NOTIFICATION   Patient Details  Name: Kristine Rangel MRN: [id number] Date of Birth: 1956-08-30   Medicare Observation Status Notification Given:  Yes    Temima Kutsch W, CMA 07/07/2024, 11:05 AM

## 2024-07-07 NOTE — Progress Notes (Signed)
 Patient is not able to walk the distance required to go the bathroom, or he/she is unable to safely negotiate stairs required to access the bathroom.  A 3in1 BSC will alleviate this problem

## 2024-07-07 NOTE — Progress Notes (Signed)
 Patient to discharge home. PIV removed. Wound vac removed and honeycomb dressing placed. Education provided regarding medications. Follow up instructions given to patient.Questions answered. No concerns voiced

## 2024-07-07 NOTE — Plan of Care (Signed)

## 2024-07-07 NOTE — Progress Notes (Signed)
 Physical Therapy Treatment Patient Details Name: JANITA CAMBEROS MRN: [id number] DOB: Jul 15, 1956 Today's Date: 07/07/2024   History of Present Illness 68 y/o female patient presents to hospital for elective L THA surgery. She is POD 0 at time of evaluation (07/03/2024).    PT Comments  Pt in chair, feeling better.  Stood and walks 10' to [facility] for large formed BM then continues to door and back to recliner.  Overall improved gait quality and distance today.  While gait is still limited she is walking the distances needed to get in/out of home and to navigate inside.  She will have +24 hour help from a friend and feels good discharging at this time.  Communicated with team. Pt does hope to discharge around noon today.   If plan is discharge home, recommend the following: A little help with walking and/or transfers;A lot of help with bathing/dressing/bathroom;Assistance with cooking/housework;Direct supervision/assist for medications management;Direct supervision/assist for financial management;Assist for transportation;Help with stairs or ramp for entrance   Can travel by private vehicle        Equipment Recommendations  None recommended by PT    Recommendations for Other Services       Precautions / Restrictions Precautions Precautions: Posterior Hip Recall of Precautions/Restrictions: Intact Restrictions Weight Bearing Restrictions Per Provider Order: Yes LLE Weight Bearing Per Provider Order: Weight bearing as tolerated     Mobility  Bed Mobility               General bed mobility comments: in chair before and after Patient Response: Cooperative  Transfers Overall transfer level: Needs assistance Equipment used: Rolling walker (2 wheels) Transfers: Sit to/from Stand Sit to Stand: Min assist                Ambulation/Gait Ambulation/Gait assistance: Contact guard assist, Min assist Gait Distance (Feet): 50 Feet Assistive device: Rolling walker (2 wheels) Gait  Pattern/deviations: Step-to pattern, Decreased weight shift to left, Antalgic, Wide base of support Gait velocity: decreased         Stairs             Wheelchair Mobility     Tilt Bed Tilt Bed Patient Response: Cooperative  Modified Rankin (Stroke Patients Only)       Balance Overall balance assessment: Needs assistance Sitting-balance support: Feet supported Sitting balance-Leahy Scale: Good     Standing balance support: Bilateral upper extremity supported, During functional activity, Reliant on assistive device for balance Standing balance-Leahy Scale: Fair Standing balance comment: heavy reliance on RW for support                            Communication Communication Communication: No apparent difficulties  Cognition Arousal: Alert Behavior During Therapy: WFL for tasks assessed/performed   PT - Cognitive impairments: No apparent impairments                         Following commands: Intact      Cueing Cueing Techniques: Verbal cues  Exercises Other Exercises Other Exercises: to bSC for large formed BM    General Comments        Pertinent Vitals/Pain Pain Assessment Pain Assessment: Faces Faces Pain Scale: Hurts little more Pain Location: L hip Pain Descriptors / Indicators: Grimacing, Sore, Operative site guarding Pain Intervention(s): Limited activity within patient's tolerance, Monitored during session, Patient requesting pain meds-RN notified    Home Living  Prior Function            PT Goals (current goals can now be found in the care plan section) Progress towards PT goals: Progressing toward goals    Frequency    BID      PT Plan      Co-evaluation              AM-PAC PT 6 Clicks Mobility   Outcome Measure  Help needed turning from your back to your side while in a flat bed without using bedrails?: A Little Help needed moving from lying on your back  to sitting on the side of a flat bed without using bedrails?: A Lot Help needed moving to and from a bed to a chair (including a wheelchair)?: A Little Help needed standing up from a chair using your arms (e.g., wheelchair or bedside chair)?: A Little Help needed to walk in hospital room?: A Little Help needed climbing 3-5 steps with a railing? : A Lot 6 Click Score: 16    End of Session Equipment Utilized During Treatment: Gait belt Activity Tolerance: Patient tolerated treatment well Patient left: in bed;with bed alarm set;with call bell/phone within reach Nurse Communication: Mobility status PT Visit Diagnosis: Unsteadiness on feet (R26.81);Muscle weakness (generalized) (M62.81);Difficulty in walking, not elsewhere classified (R26.2)     Time: 9164-9144 PT Time Calculation (min) (ACUTE ONLY): 20 min  Charges:    $Gait Training: 8-22 mins PT General Charges $$ ACUTE PT VISIT: 1 Visit                   Lauraine Gills, PTA 07/07/24, 9:01 AM

## 2024-07-18 ENCOUNTER — Other Ambulatory Visit: Payer: Self-pay | Admitting: Internal Medicine

## 2024-08-12 ENCOUNTER — Other Ambulatory Visit: Payer: Self-pay | Admitting: Internal Medicine

## 2024-08-21 ENCOUNTER — Ambulatory Visit: Attending: Nurse Practitioner | Admitting: Physician Assistant

## 2024-08-21 ENCOUNTER — Encounter: Payer: Self-pay | Admitting: Nurse Practitioner

## 2024-08-21 VITALS — BP 113/72 | HR 81 | Ht 63.0 in | Wt 270.2 lb

## 2024-08-21 DIAGNOSIS — E1169 Type 2 diabetes mellitus with other specified complication: Secondary | ICD-10-CM

## 2024-08-21 DIAGNOSIS — I5032 Chronic diastolic (congestive) heart failure: Secondary | ICD-10-CM

## 2024-08-21 DIAGNOSIS — I340 Nonrheumatic mitral (valve) insufficiency: Secondary | ICD-10-CM

## 2024-08-21 DIAGNOSIS — R072 Precordial pain: Secondary | ICD-10-CM

## 2024-08-21 DIAGNOSIS — E785 Hyperlipidemia, unspecified: Secondary | ICD-10-CM

## 2024-08-21 DIAGNOSIS — I10 Essential (primary) hypertension: Secondary | ICD-10-CM | POA: Diagnosis not present

## 2024-08-21 MED ORDER — METOPROLOL TARTRATE 100 MG PO TABS: ORAL_TABLET | ORAL | 0 refills | Status: AC

## 2024-08-21 MED ORDER — ASPIRIN 81 MG PO TBEC: 81.0000 mg | DELAYED_RELEASE_TABLET | Freq: Every day | ORAL | Status: AC

## 2024-08-21 NOTE — Patient Instructions (Signed)
 Medication Instructions:  Your physician recommends the following medication changes.  STOP TAKING: Eliquis    START TAKING: Aspirin  81 mg by mouth daily    *If you need a refill on your cardiac medications before your next appointment, please call your pharmacy*  Lab Work: Your provider would like for you to have following labs drawn today BMP.     Testing/Procedures:   Your cardiac CT will be scheduled at one of the below locations:   [facility] [address] [place], [place] [address] [phone]  If scheduled at [facility] or [facility], please arrive 15 mins early for check-in and test prep.  There is spacious parking and easy access to the radiology department from the [facility] entrance. Please enter here and check-in with the desk attendant.   Please follow these instructions carefully (unless otherwise directed):  An IV will be required for this test and Nitroglycerin  will be given.   On the Night Before the Test: Be sure to Drink plenty of water . Do not consume any caffeinated/decaffeinated beverages or chocolate 12 hours prior to your test. Do not take any antihistamines 12 hours prior to your test.   On the Day of the Test: Drink plenty of water  until 1 hour prior to the test. Do not eat any food 1 hour prior to test. You may take your regular medications prior to the test.  Take metoprolol  (Lopressor ) 100 mg two hours prior to test. Ok to take your Carvedilol  as scheduled  If you take Furosemide , please HOLD on the morning of the test. Patients who wear a continuous glucose monitor MUST remove the device prior to scanning. FEMALES- please wear underwire-free bra if available, avoid dresses & tight clothing       After the Test: Drink plenty of water . After receiving IV contrast, you may experience a mild flushed feeling. This is normal. On occasion, you may  experience a mild rash up to 24 hours after the test. This is not dangerous. If this occurs, you can take Benadryl  25 mg, Zyrtec, Claritin , or Allegra and increase your fluid intake. (Patients taking Tikosyn should avoid Benadryl , and may take Zyrtec, Claritin , or Allegra) If you experience trouble breathing, this can be serious. If it is severe call 911 IMMEDIATELY. If it is mild, please call our office.  We will call to schedule your test 2-4 weeks out understanding that some insurance companies will need an authorization prior to the service being performed.   For more information and frequently asked questions, please visit our website : [email or web address]  For non-scheduling related questions, please contact the cardiac imaging nurse navigator should you have any questions/concerns: Cardiac Imaging Nurse Navigators Direct Office Dial: [phone]   For scheduling needs, including cancellations and rescheduling, please call [place], [phone].    Follow-Up: At [facility], you and your health needs are our priority.  As part of our continuing mission to provide you with exceptional heart care, our providers are all part of one team.  This team includes your primary Cardiologist (physician) and Advanced Practice Providers or APPs (Physician Assistants and Nurse Practitioners) who all work together to provide you with the care you need, when you need it.  Your next appointment:   2 month(s)  Provider:   Lonni Hanson, MD or Lesley Maffucci, PA-C

## 2024-08-21 NOTE — Progress Notes (Signed)
 Cardiology Office Note    Date:  08/21/2024   ID:  Reegan, Mctighe 19-Jan-1956, MRN [id number]  PCP:  [place] HERO, MD  Cardiologist:  Lonni Hanson, MD  Electrophysiologist:  None   Chief Complaint: Follow up  History of Present Illness:   Kristine Rangel is a 68 y.o. female with history of chronic HFpEF, hypertension, hyperlipidemia, type 2 diabetes, OSA, cerebral aneurysm s/p coiling, TIA, and COPD who presents for follow up on HFpEF.    Patient previously underwent dobutamine stress testing in 2009 which showed no evidence of wall motion abnormalities and had previously been followed by Dr. Florencio.  She was seen by Dr. Hanson in 01/2021 with complaints of intermittent chest pain, dyspnea, dizziness, and lower extremity swelling.  Echo showed EF 60 to 65% with G2 DD and RSVP of 32.6 mmHg.  Coronary CTA showed normal coronary arteries calcium  score of 0.  Follow-up echo 05/2022 showed EF 60 to 65%, G1 DD, and no significant valvular abnormalities.   Patient was most recently seen in our office 07/27/2023 by Dr. Hanson and was overall doing well from a cardiac perspective.  She had progressive right shoulder pain for which she was scheduled for surgery the following week.  No further testing was indicated at that time.  No medication changes were made.  She had preoperative cardiovascular risk assessment via televisit 02/2024 for right hip arthroplasty without further testing indicated.  She had left hip arthroplasty 06/2024.  Patient presents today with concern for an episode of chest pressure which occurred about a month ago.  This was not associated with exertion.  It came on suddenly and lasted several minutes before spontaneously resolving.  She has been under a significant amount of stress recently given the passing of 3 close family members.  Since the episode about a month ago she reports intermittent more brief episodes of chest tightness.  She also recently had both of her hips  replaced.  She has been recovering well from this and walking daily.  She denies exertional dyspnea and chest pain.  She denies shortness of breath, lightheadedness, dizziness, palpitations, lower extremity swelling, orthopnea, and PND.  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   Bladder leak    CAD (coronary artery disease) 09/11/2011   a.) LHC 09/11/2011: minor luminal irregulaties mLAD, mLCx, mRCA - med mgmt.   Cardiac murmur    Chest pain    a.) 10/07/2008 Neg [place] Echo; b.) LHC 09/11/2011 - minor lum irregs; no sig CAD; c.) MV 06/02/2015: no ischemia; d.) cCTA 02/2021: Ca2+ = 0. Normal coronaries   CKD (chronic kidney disease), stage III (HCC)    COPD (chronic obstructive pulmonary disease) (HCC)    Depression  Diet-controlled type 2 diabetes mellitus (HCC)    Dyspnea    Essential hypertension    GERD (gastroesophageal reflux disease)    Glaucoma    History of kidney stones    Hyperlipidemia    Insomnia    a.) takes suvorexant    MI (myocardial infarction) (HCC)    a.) in the 1990s; details unclear   Migraine with aura    Neuropathy    On apixaban  therapy    OSA on CPAP    Primary osteoarthritis of left hip    Rotator cuff injury    Seasonal allergies    Tendinitis of right  rotator cuff    TIA (transient ischemic attack)    a. 05/2022 CTA head/neck: No significant stenosis; b. 05/2022 MRI Brain: No acute abnormality. Mild chronic small vessel ischemia.    Current Medications: Current Meds  Medication Sig   acetaminophen  (TYLENOL ) 500 MG tablet Take 2 tablets (1,000 mg total) by mouth every 6 (six) hours. (Patient taking differently: Take 1,000 mg by mouth every 6 (six) hours as needed for moderate pain (pain score 4-6).)   albuterol  (PROVENTIL ) (2.5 MG/3ML) 0.083% nebulizer solution Take 2.5 mg by nebulization every 6 (six) hours as needed for wheezing or shortness of breath.   albuterol  (VENTOLIN  HFA) 108 (90 Base) MCG/ACT inhaler Inhale 1-2 puffs into the lungs every 6 (six) hours as needed for wheezing or shortness of breath.   aspirin  EC 81 MG tablet Take 1 tablet (81 mg total) by mouth daily. Swallow whole.   atorvastatin  (LIPITOR ) 80 MG tablet TAKE 1 TABLET BY MOUTH DAILY   brimonidine -timolol  (COMBIGAN ) 0.2-0.5 % ophthalmic solution Place 1 drop into both eyes in the morning and at bedtime.   carvedilol  (COREG ) 6.25 MG tablet TAKE 1 TABLET BY MOUTH TWICE A DAY   cetirizine (ZYRTEC) 10 MG tablet Take 10 mg by mouth daily.   chlorhexidine  (HIBICLENS ) 4 % external liquid Apply 15 mLs (1 Application total) topically as directed for 30 doses. Use as directed daily for 5 days every other week for 6 weeks.   DULoxetine  (CYMBALTA ) 60 MG capsule Take 60 mg by mouth at bedtime.    empagliflozin  (JARDIANCE ) 25 MG TABS tablet Take 25 mg by mouth daily.   EPINEPHrine  0.3 mg/0.3 mL IJ SOAJ injection Inject 0.3 mg into the skin as needed for anaphylaxis.   esomeprazole (NEXIUM) 40 MG capsule Take 40 mg by mouth 2 (two) times daily before a meal.   fluticasone  (FLONASE ) 50 MCG/ACT nasal spray Place 1 spray into both nostrils daily.   furosemide  (LASIX ) 40 MG tablet Take 40 mg by mouth 2 (two) times daily.    gabapentin  (NEURONTIN ) 300 MG capsule Take 300-600 mg by mouth 3  (three) times daily.   Melatonin 3 MG CAPS Take by mouth as needed.   metoprolol  tartrate (LOPRESSOR ) 100 MG tablet TAKE 1 TABLET 2 HR PRIOR TO CARDIAC PROCEDURE   mirabegron  ER (MYRBETRIQ ) 50 MG TB24 tablet Take 50 mg by mouth daily.   nitroGLYCERIN  (NITROSTAT ) 0.4 MG SL tablet Place 0.4 mg under the tongue every 5 (five) minutes as needed for chest pain.   Semaglutide,0.25 or 0.5MG /DOS, (OZEMPIC, 0.25 OR 0.5 MG/DOSE,) 2 MG/3ML SOPN Inject 0.25 mg into the skin every Tuesday.   Suvorexant  (BELSOMRA ) 20 MG TABS Take 20 mg by mouth at bedtime.   SYMBICORT 160-4.5 MCG/ACT inhaler Inhale 2 puffs into the lungs daily as needed (shortness of breath).   tiZANidine  (ZANAFLEX ) 4 MG tablet Take 4 mg  by mouth 3 (three) times daily.   topiramate  (TOPAMAX ) 50 MG tablet Take 50 mg by mouth at bedtime.   traMADol  (ULTRAM ) 50 MG tablet Take 50 mg by mouth daily.   Turmeric (QC TUMERIC COMPLEX PO) Take by mouth daily.   [DISCONTINUED] apixaban  (ELIQUIS ) 2.5 MG TABS tablet Take 1 tablet (2.5 mg total) by mouth 2 (two) times daily.    Allergies:   Bee venom and Penicillins   Social History   Socioeconomic History   Marital status: Single    Spouse name: Not on file   Number of children: 2   Years of education: Not on file   Highest education level: Not on file  Occupational History   Not on file  Tobacco Use   Smoking status: Former    Types: Cigarettes   Smokeless tobacco: Current    Types: Snuff  Vaping Use   Vaping status: Never Used  Substance and Sexual Activity   Alcohol use: Not Currently    Comment: rarely wine   Drug use: No   Sexual activity: Not Currently    Birth control/protection: Post-menopausal  Other Topics Concern   Not on file  Social History Narrative   Lives alone in apartment; god-daughter assists with care   Social Drivers of Health   Financial Resource Strain: Low Risk  (07/18/2024)   Received from [facility]   Overall Financial Resource Strain  (CARDIA)    Difficulty of Paying Living Expenses: Not hard at all  Food Insecurity: No Food Insecurity (07/18/2024)   Received from [facility]   Hunger Vital Sign    Within the past 12 months, you worried that your food would run out before you got the money to buy more.: Never true    Within the past 12 months, the food you bought just didn't last and you didn't have money to get more.: Never true  Recent Concern: Food Insecurity - Food Insecurity Present (07/03/2024)   Hunger Vital Sign    Worried About Running Out of Food in the Last Year: Never true    Ran Out of Food in the Last Year: Sometimes true  Transportation Needs: No Transportation Needs (07/18/2024)   Received from [facility] - Transportation    In the past 12 months, has lack of transportation kept you from medical appointments or from getting medications?: No    Lack of Transportation (Non-Medical): No  Recent Concern: Transportation Needs - Unmet Transportation Needs (07/03/2024)   PRAPARE - Administrator, Civil Service (Medical): Yes    Lack of Transportation (Non-Medical): Yes  Physical Activity: Not on file  Stress: Not on file  Social Connections: Moderately Integrated (07/03/2024)   Social Connection and Isolation Panel    Frequency of Communication with Friends and Family: More than three times a week    Frequency of Social Gatherings with Friends and Family: Three times a week    Attends Religious Services: 1 to 4 times per year    Active Member of Clubs or Organizations: No    Attends Engineer, structural: 1 to 4 times per year    Marital Status: Divorced     Family History:  The patient's family history includes Breast cancer in her maternal aunt; Diabetes in her mother; Heart attack in her paternal grandfather; Stroke in her paternal grandfather.  ROS:   12-point review of systems is negative unless otherwise noted in the HPI.  EKGs/Other  Studies Reviewed:    Studies reviewed were summarized above. The additional studies were reviewed today:  05/2022 Echo complete 1. Left ventricular ejection fraction, by estimation, is 60 to 65%. The left ventricle has normal function. The left ventricle has no regional wall motion abnormalities. Left ventricular diastolic parameters are consistent with Grade I diastolic dysfunction (impaired relaxation).   2. Right ventricular systolic function is normal. The right ventricular size is normal.   3. The mitral valve is normal in structure. No evidence of mitral valve regurgitation.   4. The aortic valve was not well visualized. Aortic valve regurgitation is not visualized.   02/2021 Coronary CTA 1. Normal coronary calcium  score of 0. Patient is low risk for coronary events. 2. Normal coronary origin with right dominance. 3. No evidence of CAD. 4. CAD-RADS 0. Consider non-atherosclerotic causes of chest pain.  EKG:  EKG personally reviewed by me today EKG Interpretation Date/Time:  Thursday August 21 2024 10:53:51 EDT Ventricular Rate:  81 PR Interval:  142 QRS Duration:  78 QT Interval:  380 QTC Calculation: 441 R Axis:   53  Text Interpretation: Normal sinus rhythm Normal ECG When compared with ECG of 19-Feb-2024 11:00, No significant change was found Confirmed by Lorene Sinclair ([id number]) on 08/21/2024 11:07:36 AM  PHYSICAL EXAM:    VS:  BP 113/72 (BP Location: Left Wrist, Patient Position: Sitting, Cuff Size: Normal)   Pulse 81   Ht 5' 3 (1.6 m)   Wt 270 lb 3.2 oz (122.6 kg)   SpO2 99%   BMI 47.86 kg/m   BMI: Body mass index is 47.86 kg/m.  GEN: Well nourished, well developed in no acute distress NECK: No JVD; No carotid bruits CARDIAC: RRR, no murmurs, rubs, gallops RESPIRATORY:  Clear to auscultation without rales, wheezing or rhonchi  ABDOMEN: Soft, non-tender, non-distended EXTREMITIES: No edema; No deformity  Wt Readings from Last 3 Encounters:  08/21/24 270 lb 3.2 oz  (122.6 kg)  06/26/24 270 lb 11.6 oz (122.8 kg)  03/01/24 275 lb 9.2 oz (125 kg)                  ASSESSMENT & PLAN:   Precordial pain - Prior coronary CTA 02/2021 with calcium  score of 0 and no evidence of CAD. She reports one episode of significant chest pressure about a month ago with intermittent chest tightness since. Not associated with exertion. Recommend repeat coronary CTA to evaluate further. Check BMP.   Chronic HFpEF Mitral regurgitation - Most recent echo 05/2022 with EF 60-65% with G1DD and without evidence of mitral valve regurgitation. She appears euvolemic on exam. She is continued on Jardiance  25 mg daily and Lasix  40 mg twice daily.   Hypertension - BP well controlled on carvedilol  6.25 mg twice daily.   Hyperlipidemia - Most recent lipid panel 07/2023 with LDL 83. She is continued on atorvastatin  80 mg daily.   Obesity - BMI 47. She is on Ozempic. Encourage weight loss through lifestyle modifications.     Disposition: Obtain coronary CTA. Check BMP. F/u with Dr. Mady or an APP in 2 months.   Medication Adjustments/Labs and Tests Ordered: Current medicines are reviewed at length with the patient today.  Concerns regarding medicines are outlined above. Medication changes, Labs and Tests ordered today are summarized above and listed in the Patient Instructions accessible in Encounters.   Signed, Sinclair Lorene, PA-C 08/21/2024 1:32 PM     [facility] [place] [address] [place],  [place] [address] [phone]

## 2024-08-22 ENCOUNTER — Ambulatory Visit: Payer: Self-pay | Admitting: Physician Assistant

## 2024-08-22 LAB — BASIC METABOLIC PANEL WITH GFR
BUN/Creatinine Ratio: 10 — ABNORMAL LOW (ref 12–28)
BUN: 11 mg/dL (ref 8–27)
CO2: 22 mmol/L (ref 20–29)
Calcium: 10.2 mg/dL (ref 8.7–10.3)
Chloride: 104 mmol/L (ref 96–106)
Creatinine, Ser: 1.15 mg/dL — ABNORMAL HIGH (ref 0.57–1.00)
Glucose: 92 mg/dL (ref 70–99)
Potassium: 4 mmol/L (ref 3.5–5.2)
Sodium: 142 mmol/L (ref 134–144)
eGFR: 51 mL/min/1.73 — ABNORMAL LOW (ref >59)

## 2024-08-29 ENCOUNTER — Other Ambulatory Visit: Payer: Self-pay | Admitting: Internal Medicine

## 2024-09-09 ENCOUNTER — Ambulatory Visit
Admission: RE | Admit: 2024-09-09 | Discharge: 2024-09-09 | Disposition: A | Discharge status: Home / Self Care | Source: Ambulatory Visit | Attending: Family Medicine | Admitting: Family Medicine

## 2024-09-09 ENCOUNTER — Encounter (HOSPITAL_COMMUNITY): Payer: Self-pay

## 2024-09-09 DIAGNOSIS — Z1231 Encounter for screening mammogram for malignant neoplasm of breast: Secondary | ICD-10-CM

## 2024-09-09 DIAGNOSIS — Z780 Asymptomatic menopausal state: Secondary | ICD-10-CM | POA: Diagnosis present

## 2024-09-10 ENCOUNTER — Telehealth (HOSPITAL_COMMUNITY): Payer: Self-pay | Admitting: *Deleted

## 2024-09-10 NOTE — Telephone Encounter (Signed)

## 2024-09-11 ENCOUNTER — Ambulatory Visit
Admission: RE | Admit: 2024-09-11 | Discharge: 2024-09-11 | Disposition: A | Discharge status: Home / Self Care | Source: Ambulatory Visit | Attending: Physician Assistant | Admitting: Physician Assistant

## 2024-09-11 DIAGNOSIS — R072 Precordial pain: Secondary | ICD-10-CM | POA: Insufficient documentation

## 2024-09-11 MED ORDER — IOHEXOL 350 MG/ML SOLN
120.0000 mL | Freq: Once | INTRAVENOUS | Status: AC | PRN
  Administered 2024-09-11: 120 mL via INTRAVENOUS

## 2024-09-11 MED ORDER — NITROGLYCERIN 0.4 MG SL SUBL
0.8000 mg | SUBLINGUAL_TABLET | Freq: Once | SUBLINGUAL | Status: AC
  Administered 2024-09-11: 0.8 mg via SUBLINGUAL
  Filled 2024-09-11: qty 25

## 2024-09-11 NOTE — Progress Notes (Signed)
 Patient tolerated procedure well. W/C to lobby.  Ambulate w/o difficulty. Denies light headedness or being dizzy. Encouraged to drink extra water today and reasoning explained. Verbalized understanding. All questions answered. ABC intact. No further needs. Discharge from procedure area w/o issues.

## 2024-10-21 ENCOUNTER — Encounter: Payer: Self-pay | Admitting: Physician Assistant

## 2024-10-21 ENCOUNTER — Ambulatory Visit: Attending: Physician Assistant | Admitting: Physician Assistant

## 2024-10-21 VITALS — BP 110/68 | HR 66 | Ht 63.0 in | Wt 270.6 lb

## 2024-10-21 DIAGNOSIS — E785 Hyperlipidemia, unspecified: Secondary | ICD-10-CM

## 2024-10-21 DIAGNOSIS — I5032 Chronic diastolic (congestive) heart failure: Secondary | ICD-10-CM

## 2024-10-21 DIAGNOSIS — E1169 Type 2 diabetes mellitus with other specified complication: Secondary | ICD-10-CM

## 2024-10-21 DIAGNOSIS — R072 Precordial pain: Secondary | ICD-10-CM

## 2024-10-21 DIAGNOSIS — I10 Essential (primary) hypertension: Secondary | ICD-10-CM

## 2024-10-21 DIAGNOSIS — I340 Nonrheumatic mitral (valve) insufficiency: Secondary | ICD-10-CM

## 2024-10-21 MED ORDER — NITROGLYCERIN 0.4 MG SL SUBL: 0.4000 mg | SUBLINGUAL_TABLET | SUBLINGUAL | 3 refills | Status: AC | PRN

## 2024-10-21 NOTE — Progress Notes (Signed)
 Cardiology Office Note    Date:  10/21/2024   ID:  Karsen, Fellows 1956-05-31, MRN [id number]  PCP:  [place] HERO, MD  Cardiologist:  Lonni Hanson, MD  Electrophysiologist:  None   Chief Complaint: Follow up  History of Present Illness:   Kristine Rangel is a 68 y.o. female with history of  chronic HFpEF, hypertension, hyperlipidemia, type 2 diabetes, OSA, cerebral aneurysm s/p coiling, TIA, and COPD who presents for follow up after testing.     Patient previously underwent dobutamine stress testing in 2009 which showed no evidence of wall motion abnormalities and had previously been followed by Dr. Florencio.  She was seen by Dr. Hanson in 01/2021 with complaints of intermittent chest pain, dyspnea, dizziness, and lower extremity swelling.  Echo showed EF 60 to 65% with G2 DD and RSVP of 32.6 mmHg.  Coronary CTA showed normal coronary arteries calcium  score of 0.  Follow-up echo 05/2022 showed EF 60 to 65%, G1 DD, and no significant valvular abnormalities.  Patient was seen in office by Dr. Hanson 07/2023 and was overall doing well from a cardiac perspective.  She had progressive right shoulder pain for which she was scheduled for surgery the following week.  No further testing is indicated at that time.  She had preoperative cardiovascular risk assessment via telehealth visit 02/2024 for right hip arthroplasty without further testing indicated.  She had left hip arthroplasty 06/2024.    Patient was most recently seen by myself 08/21/2024 with concern for an episode of chest pressure which occurred about a month prior to that visit.  It was not associated with exertion.  She reports it came on suddenly and lasted several minutes before spontaneously resolving.  She had been under significant amount of stress given the passing of 3 close family members.  Since that episode she had reported intermittent more brief episodes of chest tightness.  Coronary CTA 09/11/2024 showed calcium  score of 0 with  no evidence of CAD.  Patient presents today overall doing well from a cardiac perspective without any further chest discomfort. She reports that her pastor recently passed away which is causing her ongoing stress on top of the loss of several family members recently. She is working on keeping herself busy. She gets around with her walking at home and has a home health aid who assists her. She denies chest pain, shortness of breath, lightheadedness, dizziness, and lower extremity swelling.   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   Bladder leak    CAD (coronary artery disease) 09/11/2011   a.) LHC 09/11/2011: minor luminal irregulaties mLAD, mLCx, mRCA - med mgmt.   Cardiac murmur    Chest pain    a.) 10/07/2008 Neg Dobuatmine  Echo; b.) LHC 09/11/2011 - minor lum irregs; no sig CAD; c.) MV 06/02/2015: no ischemia; d.) cCTA 02/2021: Ca2+ = 0. Normal coronaries   CKD (chronic kidney disease), stage III (HCC)    COPD (chronic obstructive pulmonary disease) (HCC)    Depression    Diet-controlled type 2 diabetes mellitus (HCC)    Dyspnea    Essential hypertension    GERD (gastroesophageal reflux disease)    Glaucoma    History of kidney stones    Hyperlipidemia    Insomnia    a.) takes suvorexant     MI (myocardial infarction) (HCC)    a.) in the 1990s; details unclear   Migraine with aura    Neuropathy    On apixaban  therapy    OSA on CPAP    Primary osteoarthritis of left hip    Rotator cuff injury    Seasonal allergies    Tendinitis of right rotator cuff    TIA (transient ischemic attack)    a. 05/2022 CTA head/neck: No significant stenosis; b. 05/2022 MRI Brain: No acute abnormality. Mild chronic small vessel ischemia.    Current Medications: Current Meds  Medication Sig   acetaminophen  (TYLENOL ) 500 MG tablet Take 2 tablets (1,000 mg total) by mouth every 6 (six) hours. (Patient taking differently: Take 1,000 mg by mouth every 6 (six) hours as needed for moderate pain (pain score 4-6).)   albuterol  (PROVENTIL ) (2.5 MG/3ML) 0.083% nebulizer solution Take 2.5 mg by nebulization every 6 (six) hours as needed for wheezing or shortness of breath.   albuterol  (VENTOLIN  HFA) 108 (90 Base) MCG/ACT inhaler Inhale 1-2 puffs into the lungs every 6 (six) hours as needed for wheezing or shortness of breath.   aspirin  EC 81 MG tablet Take 1 tablet (81 mg total) by mouth daily. Swallow whole.   atorvastatin  (LIPITOR ) 80 MG tablet TAKE 1 TABLET BY MOUTH DAILY   brimonidine -timolol  (COMBIGAN ) 0.2-0.5 % ophthalmic solution Place 1 drop into both eyes in the morning and at bedtime.   carvedilol  (COREG ) 6.25 MG tablet TAKE 1 TABLET BY MOUTH TWICE A DAY   cetirizine (ZYRTEC) 10 MG tablet Take 10 mg by mouth daily.   chlorhexidine  (HIBICLENS ) 4 % external liquid Apply 15 mLs (1 Application total) topically as directed for 30 doses. Use as directed daily for 5 days every other week for 6 weeks.   DULoxetine  (CYMBALTA ) 60 MG capsule Take 60 mg by mouth at bedtime.    empagliflozin  (JARDIANCE ) 25 MG TABS tablet Take 25 mg by mouth daily.   EPINEPHrine  0.3 mg/0.3 mL IJ SOAJ injection Inject 0.3 mg into the skin as needed for anaphylaxis.   esomeprazole (NEXIUM) 40 MG capsule Take 40 mg by mouth 2 (two)  times daily before a meal.   fluticasone  (FLONASE ) 50 MCG/ACT nasal spray Place 1 spray into both nostrils daily.   furosemide  (LASIX ) 40 MG tablet Take 40 mg by mouth 2 (two) times daily.    gabapentin  (NEURONTIN ) 300 MG capsule Take 300-600 mg by mouth 3 (three) times daily.   Melatonin 3 MG CAPS Take by mouth as needed.   metoprolol  tartrate (LOPRESSOR ) 100 MG tablet TAKE 1 TABLET 2 HR PRIOR TO CARDIAC PROCEDURE   mirabegron  ER (MYRBETRIQ ) 50 MG TB24 tablet Take 50 mg by mouth daily.   Semaglutide,0.25 or 0.5MG /DOS, (OZEMPIC, 0.25 OR 0.5 MG/DOSE,) 2 MG/3ML SOPN Inject 0.25 mg into the skin every Tuesday.   Suvorexant  (BELSOMRA ) 20 MG TABS Take 20 mg by mouth at bedtime.  SYMBICORT 160-4.5 MCG/ACT inhaler Inhale 2 puffs into the lungs daily as needed (shortness of breath).   tiZANidine  (ZANAFLEX ) 4 MG tablet Take 4 mg by mouth 3 (three) times daily.   topiramate  (TOPAMAX ) 50 MG tablet Take 50 mg by mouth at bedtime.   traMADol  (ULTRAM ) 50 MG tablet Take 50 mg by mouth daily.   Turmeric (QC TUMERIC COMPLEX PO) Take by mouth daily.   [DISCONTINUED] nitroGLYCERIN  (NITROSTAT ) 0.4 MG SL tablet Place 0.4 mg under the tongue every 5 (five) minutes as needed for chest pain.    Allergies:   Bee venom and Penicillins   Social History   Socioeconomic History   Marital status: Single    Spouse name: Not on file   Number of children: 2   Years of education: Not on file   Highest education level: Not on file  Occupational History   Not on file  Tobacco Use   Smoking status: Former    Types: Cigarettes   Smokeless tobacco: Current    Types: Snuff  Vaping Use   Vaping status: Never Used  Substance and Sexual Activity   Alcohol use: Not Currently    Comment: rarely wine   Drug use: No   Sexual activity: Not Currently    Birth control/protection: Post-menopausal  Other Topics Concern   Not on file  Social History Narrative   Lives alone in apartment; god-daughter assists with care    Social Drivers of Health   Financial Resource Strain: Low Risk  (07/18/2024)   Received from [facility]   Overall Financial Resource Strain (CARDIA)    Difficulty of Paying Living Expenses: Not hard at all  Food Insecurity: No Food Insecurity (07/18/2024)   Received from [facility]   Hunger Vital Sign    Within the past 12 months, you worried that your food would run out before you got the money to buy more.: Never true    Within the past 12 months, the food you bought just didn't last and you didn't have money to get more.: Never true  Recent Concern: Food Insecurity - Food Insecurity Present (07/03/2024)   Hunger Vital Sign    Worried About Running Out of Food in the Last Year: Never true    Ran Out of Food in the Last Year: Sometimes true  Transportation Needs: No Transportation Needs (07/18/2024)   Received from [facility] - Transportation    In the past 12 months, has lack of transportation kept you from medical appointments or from getting medications?: No    Lack of Transportation (Non-Medical): No  Recent Concern: Transportation Needs - Unmet Transportation Needs (07/03/2024)   PRAPARE - Administrator, Civil Service (Medical): Yes    Lack of Transportation (Non-Medical): Yes  Physical Activity: Not on file  Stress: Not on file  Social Connections: Moderately Integrated (07/03/2024)   Social Connection and Isolation Panel    Frequency of Communication with Friends and Family: More than three times a week    Frequency of Social Gatherings with Friends and Family: Three times a week    Attends Religious Services: 1 to 4 times per year    Active Member of Clubs or Organizations: No    Attends Engineer, Structural: 1 to 4 times per year    Marital Status: Divorced     Family History:  The patient's family history includes Breast cancer in her maternal aunt; Diabetes in her  mother; Heart attack  in her paternal grandfather; Stroke in her paternal grandfather.  ROS:   12-point review of systems is negative unless otherwise noted in the HPI.  EKGs/Other Studies Reviewed:    Studies reviewed were summarized above. The additional studies were reviewed today:  09/11/2024 Coronary CTA 1. Coronary calcium  score of 0. 2. Normal coronary origin with right dominance. 3. No evidence of CAD. 4. CAD-RADS 0.  Consider non-atherosclerotic causes of chest pain.  05/2022 Echo complete 1. Left ventricular ejection fraction, by estimation, is 60 to 65%. The  left ventricle has normal function. The left ventricle has no regional  wall motion abnormalities. Left ventricular diastolic parameters are  consistent with Grade I diastolic  dysfunction (impaired relaxation).   2. Right ventricular systolic function is normal. The right ventricular  size is normal.   3. The mitral valve is normal in structure. No evidence of mitral valve  regurgitation.   4. The aortic valve was not well visualized. Aortic valve regurgitation  is not visualized.   EKG:  EKG personally reviewed by me today    PHYSICAL EXAM:    VS:  BP 110/68 (BP Location: Left Arm, Patient Position: Sitting, Cuff Size: Normal)   Pulse 66   Ht 5' 3 (1.6 m)   Wt 270 lb 9.6 oz (122.7 kg)   SpO2 97%   BMI 47.93 kg/m   BMI: Body mass index is 47.93 kg/m.  GEN: Well nourished, well developed in no acute distress NECK: No JVD; No carotid bruits CARDIAC: RRR, no murmurs, rubs, gallops RESPIRATORY:  Clear to auscultation without rales, wheezing or rhonchi  ABDOMEN: Soft, non-tender, non-distended EXTREMITIES: No edema; No deformity  Wt Readings from Last 3 Encounters:  10/21/24 270 lb 9.6 oz (122.7 kg)  08/21/24 270 lb 3.2 oz (122.6 kg)  06/26/24 270 lb 11.6 oz (122.8 kg)                  ASSESSMENT & PLAN:   Precordial pain - Coronary CTA with calcium  score of 0 and no evidence of CAD. No further chest discomfort.  Suspect this was due to stress. Continue ASA and statin therapy. As needed nitroglycerin .   Chronic HFpEF Mitral regurgitation - Most recent echo 05/2022 with EF 60 to 65%, G1 DD, and no evidence of mitral valve regurgitation.  Appears euvolemic on exam.  Continue Jardiance  25 mg daily and Lasix  40 mg twice daily.  Hypertension - BP well-controlled on carvedilol  6.25 mg twice daily.  Hyperlipidemia - Most recent lipid panel 07/2023 with LDL 83.  Continue atorvastatin  80 mg daily.  Obesity - BMI 47.  She is on Ozempic.  Encouraged weight loss through lifestyle modifications.    Disposition: F/u with Dr. Mady or an APP in 6 months.   Medication Adjustments/Labs and Tests Ordered: Current medicines are reviewed at length with the patient today.  Concerns regarding medicines are outlined above. Medication changes, Labs and Tests ordered today are summarized above and listed in the Patient Instructions accessible in Encounters.   Bonney Lesley Maffucci, PA-C 10/21/2024 10:00 AM     [facility] [place] [address] [place], [place] [address] [phone]

## 2024-10-21 NOTE — Patient Instructions (Signed)
 Medication Instructions:  Your physician recommends that you continue on your current medications as directed. Please refer to the Current Medication list given to you today.  *If you need a refill on your cardiac medications before your next appointment, please call your pharmacy*  Lab Work: No labs ordered today  If you have labs (blood work) drawn today and your tests are completely normal, you will receive your results only by: MyChart Message (if you have MyChart) OR A paper copy in the mail If you have any lab test that is abnormal or we need to change your treatment, we will call you to review the results.  Testing/Procedures: No test ordered today   Follow-Up: At [facility], you and your health needs are our priority.  As part of our continuing mission to provide you with exceptional heart care, our providers are all part of one team.  This team includes your primary Cardiologist (physician) and Advanced Practice Providers or APPs (Physician Assistants and Nurse Practitioners) who all work together to provide you with the care you need, when you need it.  Your next appointment:   6 month(s)  Provider:   You may see Sammy Crisp, MD or one of the following Advanced Practice Providers on your designated Care Team:   Laneta Pintos, NP Gildardo Labrador, PA-C Varney Gentleman, PA-C Cadence Rewey, PA-C Ronald Cockayne, NP Morey Ar, NP    We recommend signing up for the patient portal called "MyChart".  Sign up information is provided on this After Visit Summary.  MyChart is used to connect with patients for Virtual Visits (Telemedicine).  Patients are able to view lab/test results, encounter notes, upcoming appointments, etc.  Non-urgent messages can be sent to your provider as well.   To learn more about what you can do with MyChart, go to [email or web address].

## 2024-11-03 ENCOUNTER — Ambulatory Visit

## 2024-11-03 DIAGNOSIS — R278 Other lack of coordination: Secondary | ICD-10-CM | POA: Insufficient documentation

## 2024-11-03 DIAGNOSIS — M25542 Pain in joints of left hand: Secondary | ICD-10-CM | POA: Insufficient documentation

## 2024-11-03 DIAGNOSIS — M65332 Trigger finger, left middle finger: Secondary | ICD-10-CM | POA: Insufficient documentation

## 2024-11-03 DIAGNOSIS — M6281 Muscle weakness (generalized): Secondary | ICD-10-CM | POA: Diagnosis present

## 2024-11-03 DIAGNOSIS — M25541 Pain in joints of right hand: Secondary | ICD-10-CM | POA: Diagnosis present

## 2024-11-03 DIAGNOSIS — M65321 Trigger finger, right index finger: Secondary | ICD-10-CM | POA: Insufficient documentation

## 2024-11-03 DIAGNOSIS — M180 Bilateral primary osteoarthritis of first carpometacarpal joints: Secondary | ICD-10-CM | POA: Diagnosis present

## 2024-11-03 NOTE — Therapy (Unsigned)
 " OUTPATIENT OCCUPATIONAL THERAPY ORTHO EVALUATION  Patient Name: Kristine Rangel MRN: [id number] DOB:June 20, 1956, 68 y.o., female Today's Date: 11/04/2024  PCP: Dr. Rock Pounds REFERRING PROVIDER: Dr. Jackquline Barrack (orthopedist)  END OF SESSION:  OT End of Session - 11/04/24 2208     Visit Number 1    Number of Visits 24    Date for Recertification  01/26/25    Progress Note Due on Visit 10    OT Start Time 0930    OT Stop Time 1015    OT Time Calculation (min) 45 min    Activity Tolerance Patient tolerated treatment well    Behavior During Therapy [facility] for tasks assessed/performed         Past Medical History:  Diagnosis Date   (HFpEF) heart failure with preserved ejection fraction (HCC)    a.) TTE 03/03/2021: EF 60-65%, no RWMAs, mild LA dil, RVSP 32.6, G2DD; b.) TTE 05/17/2022: EF 60-65%, no RWMAs, normal RVSF, G1DD   Anemia    when she was younger   Anxiety    Arthritis    Asthma    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   Bladder leak    CAD (coronary artery disease) 09/11/2011   a.) LHC 09/11/2011: minor luminal irregulaties mLAD, mLCx, mRCA - med mgmt.   Cardiac murmur    Chest pain    a.) 10/07/2008 Neg [place] Echo; b.) LHC 09/11/2011 - minor lum irregs; no sig CAD; c.) MV 06/02/2015: no ischemia; d.) cCTA 02/2021: Ca2+ = 0. Normal coronaries   CKD (chronic kidney disease), stage III (HCC)    COPD (chronic obstructive pulmonary disease) (HCC)    Depression    Diet-controlled type 2 diabetes mellitus (HCC)    Dyspnea    Essential hypertension    GERD (gastroesophageal reflux disease)    Glaucoma    History of kidney stones    Hyperlipidemia    Insomnia    a.) takes suvorexant    MI (myocardial infarction) (HCC)    a.) in the 1990s; details unclear    Migraine with aura    Neuropathy    On apixaban  therapy    OSA on CPAP    Primary osteoarthritis of left hip    Rotator cuff injury    Seasonal allergies    Tendinitis of right rotator cuff    TIA (transient ischemic attack)    a. 05/2022 CTA head/neck: No significant stenosis; b. 05/2022 MRI Brain: No acute abnormality. Mild chronic small vessel ischemia.   Past Surgical History:  Procedure Laterality Date   BRAIN SURGERY Right 2010   anuerysm repair   CARPAL TUNNEL RELEASE Right 06/12/2017   Procedure: CARPAL TUNNEL RELEASE;  Surgeon: Marchia Drivers, MD;  Location: [facility];  Service: Orthopedics;  Laterality: Right;   CHOLECYSTECTOMY     COLONOSCOPY WITH PROPOFOL  N/A 08/09/2021   Procedure: COLONOSCOPY WITH PROPOFOL ;  Surgeon: Maryruth Ole DASEN, MD;  Location: [facility];  Service: Endoscopy;  Laterality: N/A;   ESOPHAGOGASTRODUODENOSCOPY (EGD) WITH PROPOFOL  N/A 08/09/2021   Procedure: ESOPHAGOGASTRODUODENOSCOPY (EGD) WITH PROPOFOL ;  Surgeon: Maryruth Ole DASEN, MD;  Location: [facility];  Service: Endoscopy;  Laterality: N/A;   LEFT HEART CATH AND CORONARY ANGIOGRAPHY Left 09/11/2011   Procedure: LEFT HEART CATH AND CORONARY ANGIOGRAPHY; Location: [facility]; Surgeon: Denyse Bathe, MD   REVERSE  SHOULDER ARTHROPLASTY Right 08/02/2023   Procedure: REVERSE SHOULDER ARTHROPLASTY;  Surgeon: Edie Norleen PARAS, MD;  Location: [facility];  Service: Orthopedics;  Laterality: Right;   SHOULDER ARTHROSCOPY WITH OPEN ROTATOR CUFF REPAIR Left 11/23/2016   Procedure: SHOULDER ARTHROSCOPY WITH OPEN ROTATOR CUFF REPAIR, ARTHROSCOPIC SUBACROMIAL DECOMPRESSION, DISTAL CLAVICLE EXCISION;  Surgeon: Franky Cranker, MD;  Location: [facility];  Service: Orthopedics;  Laterality: Left;   SHOULDER ARTHROSCOPY WITH OPEN ROTATOR CUFF REPAIR Right 07/30/2018   Procedure: SHOULDER ARTHROSCOPY WITH OPEN ROTATOR CUFF REPAIR,SUBACROMINAL DECOMPRESSION AND DISTAL CLAVICLE EXCISION;  Surgeon: Cranker Franky, MD;   Location: [facility];  Service: Orthopedics;  Laterality: Right;   TONSILLECTOMY     TOTAL HIP ARTHROPLASTY Right 02/21/2024   Procedure: ARTHROPLASTY, HIP, TOTAL,POSTERIOR APPROACH;  Surgeon: Edie Norleen PARAS, MD;  Location: [facility];  Service: Orthopedics;  Laterality: Right;   TOTAL HIP ARTHROPLASTY Left 07/03/2024   Procedure: ARTHROPLASTY, HIP, TOTAL,POSTERIOR APPROACH;  Surgeon: Edie Norleen PARAS, MD;  Location: [facility];  Service: Orthopedics;  Laterality: Left;   TOTAL KNEE ARTHROPLASTY Bilateral    TOTAL KNEE REVISION Right 05/05/2020   Procedure: right total knee arthroplasty revision;  Surgeon: Leora Lynwood SAUNDERS, MD;  Location: [facility];  Service: Orthopedics;  Laterality: Right;   Patient Active Problem List   Diagnosis Date Noted   Status post total hip replacement, left 07/03/2024   Status post total hip replacement, right 02/21/2024   Right sided weakness 05/15/2022   GERD (gastroesophageal reflux disease) 05/15/2022   Peripheral neuropathy 05/15/2022   Hyperlipidemia associated with type 2 diabetes mellitus (HCC) 05/15/2022   OSA (obstructive sleep apnea) 05/15/2022   Chronic heart failure with preserved ejection fraction (HFpEF) (HCC) 05/15/2022   Asthma, chronic 05/15/2022   Chronic kidney disease, stage 3a (HCC) 05/15/2022   Chest pain 02/05/2021   SOB (shortness of breath) 02/05/2021   Leg swelling 02/05/2021   Nonrheumatic mitral valve regurgitation 02/05/2021   Morbid obesity (HCC) 02/05/2021   S/P revision of total knee, right 05/05/2020   S/P right rotator cuff repair 07/30/2018   S/P left rotator cuff repair 11/23/2016   Essential hypertension 02/05/2003   ONSET DATE: 1.5 months worsening pain in hands.   REFERRING DIAG:  M25.542 (ICD-10-CM) - Pain in joints of left hand  M25.541 (ICD-10-CM) - Pain in joints of right hand  M18.0 (ICD-10-CM) - Bilateral primary osteoarthritis of first carpometacarpal joints  M65.332 (ICD-10-CM) - Trigger finger, left middle finger   M65.321 (ICD-10-CM) - Trigger finger, right index finger   THERAPY DIAG:  Muscle weakness (generalized)  Primary osteoarthritis of first carpometacarpal joints, bilateral  Trigger finger, left middle finger  Trigger finger, right index finger  Joint pain in both hands  Rationale for Evaluation and Treatment: Rehabilitation  SUBJECTIVE:   SUBJECTIVE STATEMENT: Pt reports hands are sore today and agreeable to moist heat for pain management during evaluation. Pt accompanied by: self  PERTINENT HISTORY: Per EMR from visit with Dr. Ezra on 10/23/24: History of Present Illness Hydie Langan Cieslinski is a 68 year old female with carpal tunnel syndrome and diabetes who presents with hand pain and swelling.   She experiences persistent pain and swelling in her hands, particularly in the fingers, for over a month. The pain is constant and accompanied by tingling, more pronounced in the fingers than the thumb.   She has a history of carpal tunnel surgeries on both hands, which provided relief, but the current symptoms differ, focusing more on finger pain. She also underwent trigger finger surgery  on her left middle finger, which previously got stuck and required surgical intervention   She uses topical treatments like Voltaren gel and Taysofy with minimal relief and takes Tylenol  for arthritis, which provides some relief but does not completely alleviate the pain.   The left hand is worse than the right, with the most severe pain in the middle finger. No current locking or popping of the fingers since the trigger finger surgery.   She has diabetes.   No numbness, but reports tingling and constant pain in the fingers. No significant pain in the thumb.   Prior medical records were reviewed.   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 Current symptoms include constant pain in  fingers, particularly right index and left middle fingers, with possible trigger finger involvement. X-rays show arthritis at thumb bases and multiple finger joints.  Discussed steroid injections for symptom relief, with risks including infection, allergic reaction, skin discoloration, and elevated blood sugar. Hand therapy may aid in range of motion and pain management. Warm gloves and thumb braces may provide additional support and pain relief. - Administered steroid injection to left hand middle finger and right hand index finger. - Provided thumb braces for support. - Recommended use of Voltaren gel at the base of the thumbs. - Continue with Tylenol /anti-inflammatories - Referral to therapy for range of motion and pain management modalities placed - Follow-up as needed  PRECAUTIONS: None  RED FLAGS: None   WEIGHT BEARING RESTRICTIONS: No  PAIN:  Are you having pain? Yes: NPRS scale: 7-8, occasionally neck pain that can reach 6-7 1 or 2 x per week  Pain location: bilat hands, L tends to be worse than the R Pain description: achy, irritating, pins and needles  Aggravating factors: activity  Relieving factors: heat, rest, Tramadol , B wrist braces with thumb component to stabilize MCPs, wears braces on/off throughout the day but consistently at night time   FALLS: Has patient fallen in last 6 months? No  LIVING ENVIRONMENT: Lives with: lives alone Lives in: ground level apartment  Stairs: No Has following equipment at home: Vannie - 4 wheeled, shower chair, bed side commode, and Grab bars (3in1 is propped over the toilet) Grab bar by toilet and in shower  PLOF: Needs assistance with ADLs, has paid caregiver 7 days a week who helps with bathing, dressing, shopping, IADLs   PATIENT GOALS: That my hands get better. Decrease pain, be able to pick up things without dropping and breaking them.  NEXT MD VISIT: Follow up with Dr. Ezra as needed.  OBJECTIVE:  Note: Objective measures  were completed at Evaluation unless otherwise noted.  HAND DOMINANCE: Right  ADLs: CNA assist 7 days a week Eating: assist to cut food  Grooming: difficulty squeezing denture paste and toothpaste, can be difficult to hold a comb or brush Upper body dressing: difficulty with clothing fasteners  Lower body dressing: difficulty with clothing fasteners Toileting: Occasional assist for thorough peri care  Bathing: CNA assists Meal prep: Difficulty opening water  bottles, new containers and jars   FUNCTIONAL OUTCOME MEASURES: MAM-20 for musculoskeletal conditions: TBD  UPPER EXTREMITY ROM:     Active ROM Right eval Left eval  Shoulder flexion    Shoulder abduction    Shoulder adduction    Shoulder extension    Shoulder internal rotation    Shoulder external rotation    Elbow flexion    Elbow extension    Wrist  flexion 35 76  Wrist extension 49 60  Wrist ulnar deviation 45 31  Wrist radial deviation 21 33  Wrist pronation 90 90  Wrist supination 60 60  (Blank rows = not tested)  Active ROM Right eval Left eval  Thumb MCP (0-60) 40 50  Thumb IP (0-80) 35 40  Thumb Radial abd/add (0-55)     Thumb Palmar abd/add (0-45)     Thumb Opposition to Small Finger WFL WFL   Index MCP (0-90)     Index PIP (0-100)     Index DIP (0-70)      Long MCP (0-90)      Long PIP (0-100)      Long DIP (0-70)      Ring MCP (0-90)      Ring PIP (0-100)      Ring DIP (0-70)      Little MCP (0-90)      Little PIP (0-100)      Little DIP (0-70)      (Blank rows = not tested)  *L hand: 30* radial drift LF DIP joint, passively to neutral  *Pt reports L LF and R IF no longer triggering, but still sore Able to make full composite fist in each hand   UPPER EXTREMITY MMT:  TBD in follow up session   MMT Right eval Left eval  Shoulder flexion    Shoulder abduction    Shoulder adduction    Shoulder extension    Shoulder internal rotation    Shoulder external rotation    Middle trapezius     Lower trapezius    Elbow flexion    Elbow extension    Wrist flexion    Wrist extension    Wrist ulnar deviation    Wrist radial deviation    Wrist pronation    Wrist supination    (Blank rows = not tested)  HAND FUNCTION: Grip strength: Right: 45 lbs; Left: 39 lbs, Lateral pinch: Right: 14 lbs, Left: 10 lbs, and 3 point pinch: Right: 10 lbs, Left: 8 lbs  COORDINATION: TBD in follow up session 9 Hole Peg test: Right: NT sec; Left: NT sec  SENSATION: Light touch: Impaired tingly in median nerve distribution of R/L hands,   EDEMA: No visible edema  COGNITION: Overall cognitive status: Within functional limits for tasks assessed  TREATMENT DATE: 11/03/24                                                                                                                           Evaluation completed.  Self Care: Review of OT role, goals, poc  Modalities: Heat   Time: 15 min; used intermittently throughout eval for pain management/muscle relaxation Location: bilat hands  PATIENT EDUCATION: Education details: OT role, goals, poc Person educated: Patient Education method: Explanation Education comprehension: verbalized understanding  HOME EXERCISE PROGRAM: To be initiated in upcoming sessions  GOALS: Goals reviewed with patient? Yes  SHORT TERM GOALS: Target date: 12/15/24  Pt will be indep to perform HEP for improving bilat hand flexibility, strength, and coordination for daily tasks. Baseline: Eval: Not yet initiated Goal status: INITIAL  2.  Pt will be indep to verbalize 2-3 joint protection/cumulative trauma prevention strategies to reduce pain/paresthesias in the R/L hands.  Baseline: Eval: Educ not yet initiated Goal status: INITIAL  LONG TERM GOALS: Target date: 01/26/25  Pt will increase MAM-20 score for musculoskeletal conditions by (TBD) or more points to indicate improvement in self perceived functional use of the L hand for daily tasks.   Baseline:  Eval: TBD Goal status: INITIAL  2.  Pt will tolerate manual therapy, therapeutic modalities, and exercises to decrease pain in B hands to a reported 3/10 pain or less with activity.  Baseline: Eval: 7-8/10 with activity  Goal status: INITIAL  3.  Pt will increase B grip strength by 5 or more lbs to ease ability to open containers.  Baseline: R 45 lbs, L 39 lbs Goal status: INITIAL  ASSESSMENT:  CLINICAL IMPRESSION: Patient is a 69 y.o. female who was seen today for occupational therapy evaluation for functional decline related to bilat hand pain.  Pt presents with hx of extensive musculoskeletal issues in bilat hands, including B CMC arthritis, trigger finger and release, B carpal tunnel release.  Pt had trigger finger steroid injections for L LF and R IF last week, and reports soreness but no triggering.  Pt presents with pain/paresthesias in the median nerve distribution of R/L hands, pain in the thumbs, L LF, and R IF, weakness, stiffness in the wrist and hands, all contributing to increased difficulty with manipulation of ADL supplies, specifically when having to grasp/pinch/pull/twist with force in either hand.  Pt will benefit from skilled OT to address above noted deficits, focusing on pain management, activity modification, AE training, and development of HEP for improving strength, flexibility, and coordination in bilat hands to increase indep and tolerance to daily tasks.   PERFORMANCE DEFICITS: in functional skills including ADLs, IADLs, coordination, dexterity, sensation, edema, ROM, strength, pain, fascial restrictions, flexibility, Fine motor control, body mechanics, decreased knowledge of precautions, decreased knowledge of use of DME, skin integrity, and UE functional use, and psychosocial skills including coping strategies, environmental adaptation, habits, and routines and behaviors.   IMPAIRMENTS: are limiting patient from ADLs, IADLs, rest and sleep, leisure, and social  participation.   COMORBIDITIES: has co-morbidities such as arthritis, carpal tunnel release, trigger finger release, neuropathy that affects occupational performance. Patient will benefit from skilled OT to address above impairments and improve overall function.  MODIFICATION OR ASSISTANCE TO COMPLETE EVALUATION: Min-Moderate modification of tasks or assist with assess necessary to complete an evaluation.  OT OCCUPATIONAL PROFILE AND HISTORY: Detailed assessment: Review of records and additional review of physical, cognitive, psychosocial history related to current functional performance.  CLINICAL DECISION MAKING: Moderate - several treatment options, min-mod task modification necessary  REHAB POTENTIAL: Good  EVALUATION COMPLEXITY: High      PLAN:  OT FREQUENCY: 1-2x/week  OT DURATION: 12 weeks  PLANNED INTERVENTIONS: 97168 OT Re-evaluation, 97535 self care/ADL training, 02889 therapeutic exercise, 97530 therapeutic activity, 97112 neuromuscular re-education, 97140 manual therapy, 97035 ultrasound, 97018 paraffin, 02989 moist heat, 97010 cryotherapy, 97034 contrast bath, 97750 Physical Performance Testing, 02239 Orthotic Initial, [id number] Orthotic/Prosthetic subsequent, passive range of motion, psychosocial skills training, energy conservation, coping strategies training, patient/family education, and DME and/or AE instructions  RECOMMENDED OTHER SERVICES: None at this time   CONSULTED AND AGREED WITH PLAN OF CARE: Patient  PLAN  FOR NEXT SESSION: MAM-20, Initiate HEP, MMT  Inocente Blazing, MS, OTR/L   Inocente MARLA Blazing, OT 11/04/2024, 10:10 PM   "

## 2024-11-11 ENCOUNTER — Ambulatory Visit

## 2024-11-11 DIAGNOSIS — M6281 Muscle weakness (generalized): Secondary | ICD-10-CM

## 2024-11-11 DIAGNOSIS — M65321 Trigger finger, right index finger: Secondary | ICD-10-CM

## 2024-11-11 DIAGNOSIS — M180 Bilateral primary osteoarthritis of first carpometacarpal joints: Secondary | ICD-10-CM

## 2024-11-11 DIAGNOSIS — M25541 Pain in joints of right hand: Secondary | ICD-10-CM

## 2024-11-11 DIAGNOSIS — M65332 Trigger finger, left middle finger: Secondary | ICD-10-CM

## 2024-11-11 DIAGNOSIS — R278 Other lack of coordination: Secondary | ICD-10-CM

## 2024-11-11 NOTE — Therapy (Signed)
 " OUTPATIENT OCCUPATIONAL THERAPY ORTHO TREATMENT NOTE  Patient Name: Kristine Rangel MRN: [id number] DOB:January 28, 1956, 68 y.o., female Today's Date: 11/11/2024  PCP: Dr. Rock Pounds REFERRING PROVIDER: Dr. Jackquline Barrack (orthopedist)  END OF SESSION:  OT End of Session - 11/11/24 1258     Visit Number 2    Number of Visits 24    Date for Recertification  01/26/25    Progress Note Due on Visit 10    OT Start Time 1015    OT Stop Time 1100    OT Time Calculation (min) 45 min    Activity Tolerance Patient tolerated treatment well    Behavior During Therapy [facility] for tasks assessed/performed         Past Medical History:  Diagnosis Date   (HFpEF) heart failure with preserved ejection fraction (HCC)    a.) TTE 03/03/2021: EF 60-65%, no RWMAs, mild LA dil, RVSP 32.6, G2DD; b.) TTE 05/17/2022: EF 60-65%, no RWMAs, normal RVSF, G1DD   Anemia    when she was younger   Anxiety    Arthritis    Asthma    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   Bladder leak    CAD (coronary artery disease) 09/11/2011   a.) LHC 09/11/2011: minor luminal irregulaties mLAD, mLCx, mRCA - med mgmt.   Cardiac murmur    Chest pain    a.) 10/07/2008 Neg [place] Echo; b.) LHC 09/11/2011 - minor lum irregs; no sig CAD; c.) MV 06/02/2015: no ischemia; d.) cCTA 02/2021: Ca2+ = 0. Normal coronaries   CKD (chronic kidney disease), stage III (HCC)    COPD (chronic obstructive pulmonary disease) (HCC)    Depression    Diet-controlled type 2 diabetes mellitus (HCC)    Dyspnea    Essential hypertension    GERD (gastroesophageal reflux disease)    Glaucoma    History of kidney stones    Hyperlipidemia    Insomnia    a.) takes suvorexant    MI (myocardial infarction) (HCC)    a.) in the 1990s; details  unclear   Migraine with aura    Neuropathy    On apixaban  therapy    OSA on CPAP    Primary osteoarthritis of left hip    Rotator cuff injury    Seasonal allergies    Tendinitis of right rotator cuff    TIA (transient ischemic attack)    a. 05/2022 CTA head/neck: No significant stenosis; b. 05/2022 MRI Brain: No acute abnormality. Mild chronic small vessel ischemia.   Past Surgical History:  Procedure Laterality Date   BRAIN SURGERY Right 2010   anuerysm repair   CARPAL TUNNEL RELEASE Right 06/12/2017   Procedure: CARPAL TUNNEL RELEASE;  Surgeon: Marchia Drivers, MD;  Location: [facility];  Service: Orthopedics;  Laterality: Right;   CHOLECYSTECTOMY     COLONOSCOPY WITH PROPOFOL  N/A 08/09/2021   Procedure: COLONOSCOPY WITH PROPOFOL ;  Surgeon: Maryruth Ole DASEN, MD;  Location: [facility];  Service: Endoscopy;  Laterality: N/A;   ESOPHAGOGASTRODUODENOSCOPY (EGD) WITH PROPOFOL  N/A 08/09/2021   Procedure: ESOPHAGOGASTRODUODENOSCOPY (EGD) WITH PROPOFOL ;  Surgeon: Maryruth Ole DASEN, MD;  Location: [facility];  Service: Endoscopy;  Laterality: N/A;   LEFT HEART CATH AND CORONARY ANGIOGRAPHY Left 09/11/2011   Procedure: LEFT HEART CATH AND CORONARY ANGIOGRAPHY; Location: [facility]; Surgeon: Denyse Bathe, MD  REVERSE SHOULDER ARTHROPLASTY Right 08/02/2023   Procedure: REVERSE SHOULDER ARTHROPLASTY;  Surgeon: Edie Norleen PARAS, MD;  Location: [facility];  Service: Orthopedics;  Laterality: Right;   SHOULDER ARTHROSCOPY WITH OPEN ROTATOR CUFF REPAIR Left 11/23/2016   Procedure: SHOULDER ARTHROSCOPY WITH OPEN ROTATOR CUFF REPAIR, ARTHROSCOPIC SUBACROMIAL DECOMPRESSION, DISTAL CLAVICLE EXCISION;  Surgeon: Franky Cranker, MD;  Location: [facility];  Service: Orthopedics;  Laterality: Left;   SHOULDER ARTHROSCOPY WITH OPEN ROTATOR CUFF REPAIR Right 07/30/2018   Procedure: SHOULDER ARTHROSCOPY WITH OPEN ROTATOR CUFF REPAIR,SUBACROMINAL DECOMPRESSION AND DISTAL CLAVICLE EXCISION;  Surgeon: Cranker Franky, MD;  Location: [facility];  Service: Orthopedics;  Laterality: Right;   TONSILLECTOMY     TOTAL HIP ARTHROPLASTY Right 02/21/2024   Procedure: ARTHROPLASTY, HIP, TOTAL,POSTERIOR APPROACH;  Surgeon: Edie Norleen PARAS, MD;  Location: [facility];  Service: Orthopedics;  Laterality: Right;   TOTAL HIP ARTHROPLASTY Left 07/03/2024   Procedure: ARTHROPLASTY, HIP, TOTAL,POSTERIOR APPROACH;  Surgeon: Edie Norleen PARAS, MD;  Location: [facility];  Service: Orthopedics;  Laterality: Left;   TOTAL KNEE ARTHROPLASTY Bilateral    TOTAL KNEE REVISION Right 05/05/2020   Procedure: right total knee arthroplasty revision;  Surgeon: Leora Lynwood SAUNDERS, MD;  Location: [facility];  Service: Orthopedics;  Laterality: Right;   Patient Active Problem List   Diagnosis Date Noted   Status post total hip replacement, left 07/03/2024   Status post total hip replacement, right 02/21/2024   Right sided weakness 05/15/2022   GERD (gastroesophageal reflux disease) 05/15/2022   Peripheral neuropathy 05/15/2022   Hyperlipidemia associated with type 2 diabetes mellitus (HCC) 05/15/2022   OSA (obstructive sleep apnea) 05/15/2022   Chronic heart failure with preserved ejection fraction (HFpEF) (HCC) 05/15/2022   Asthma, chronic 05/15/2022   Chronic kidney disease, stage 3a (HCC) 05/15/2022   Chest pain 02/05/2021   SOB (shortness of breath) 02/05/2021   Leg swelling 02/05/2021   Nonrheumatic mitral valve regurgitation 02/05/2021   Morbid obesity (HCC) 02/05/2021   S/P revision of total knee, right 05/05/2020   S/P right rotator cuff repair 07/30/2018   S/P left rotator cuff repair 11/23/2016   Essential hypertension 02/05/2003   ONSET DATE: 1.5 months worsening pain in hands.   REFERRING DIAG:  M25.542 (ICD-10-CM) - Pain in joints of left hand  M25.541 (ICD-10-CM) - Pain in joints of right hand  M18.0 (ICD-10-CM) - Bilateral primary osteoarthritis of first carpometacarpal joints  M65.332 (ICD-10-CM) - Trigger finger, left  middle finger  M65.321 (ICD-10-CM) - Trigger finger, right index finger   THERAPY DIAG:  Muscle weakness (generalized)  Other lack of coordination  Primary osteoarthritis of first carpometacarpal joints, bilateral  Trigger finger, left middle finger  Trigger finger, right index finger  Joint pain in both hands  Rationale for Evaluation and Treatment: Rehabilitation  SUBJECTIVE:   SUBJECTIVE STATEMENT: Pt reports hands are sore today d/t the cold weather, but they were feeling better at home when she used her heating pad on her hands. Pt accompanied by: self  PERTINENT HISTORY: Per EMR from visit with Dr. Ezra on 10/23/24: History of Present Illness Betsaida [place] Gudger is a 68 year old female with carpal tunnel syndrome and diabetes who presents with hand pain and swelling.   She experiences persistent pain and swelling in her hands, particularly in the fingers, for over a month. The pain is constant and accompanied by tingling, more pronounced in the fingers than the thumb.   She has a history of carpal tunnel surgeries on both hands, which provided relief,  but the current symptoms differ, focusing more on finger pain. She also underwent trigger finger surgery on her left middle finger, which previously got stuck and required surgical intervention   She uses topical treatments like Voltaren gel and Taysofy with minimal relief and takes Tylenol  for arthritis, which provides some relief but does not completely alleviate the pain.   The left hand is worse than the right, with the most severe pain in the middle finger. No current locking or popping of the fingers since the trigger finger surgery.   She has diabetes.   No numbness, but reports tingling and constant pain in the fingers. No significant pain in the thumb.   Prior medical records were reviewed.   Assessment & Plan Hand osteoarthritis with bilateral trigger fingers and thumb carpometacarpal arthritis Chronic hand  pain primarily affecting fingers. Current symptoms include constant pain in fingers, particularly right index and left middle fingers, with possible trigger finger involvement. X-rays show arthritis at thumb bases and multiple finger joints.  Discussed steroid injections for symptom relief, with risks including infection, allergic reaction, skin discoloration, and elevated blood sugar. Hand therapy may aid in range of motion and pain management. Warm gloves and thumb braces may provide additional support and pain relief. - Administered steroid injection to left hand middle finger and right hand index finger. - Provided thumb braces for support. - Recommended use of Voltaren gel at the base of the thumbs. - Continue with Tylenol /anti-inflammatories - Referral to therapy for range of motion and pain management modalities placed - Follow-up as needed  PRECAUTIONS: None  RED FLAGS: None   WEIGHT BEARING RESTRICTIONS: No  PAIN: 11/11/24: L hand 7-8/10 pain, R 6/10 pain Are you having pain? Yes: NPRS scale: 7-8, occasionally neck pain that can reach 6-7 1 or 2 x per week  Pain location: bilat hands, L tends to be worse than the R Pain description: achy, irritating, pins and needles  Aggravating factors: activity  Relieving factors: heat, rest, Tramadol , B wrist braces with thumb component to stabilize MCPs, wears braces on/off throughout the day but consistently at night time   FALLS: Has patient fallen in last 6 months? No  LIVING ENVIRONMENT: Lives with: lives alone Lives in: ground level apartment  Stairs: No Has following equipment at home: Vannie - 4 wheeled, shower chair, bed side commode, and Grab bars (3in1 is propped over the toilet) Grab bar by toilet and in shower  PLOF: Needs assistance with ADLs, has paid caregiver 7 days a week who helps with bathing, dressing, shopping, IADLs   PATIENT GOALS: That my hands get better. Decrease pain, be able to pick up things without dropping  and breaking them.  NEXT MD VISIT: Follow up with Dr. Ezra as needed.  OBJECTIVE:  Note: Objective measures were completed at Evaluation unless otherwise noted.  HAND DOMINANCE: Right  ADLs: CNA assist 7 days a week Eating: assist to cut food  Grooming: difficulty squeezing denture paste and toothpaste, can be difficult to hold a comb or brush Upper body dressing: difficulty with clothing fasteners  Lower body dressing: difficulty with clothing fasteners Toileting: Occasional assist for thorough peri care  Bathing: CNA assists Meal prep: Difficulty opening water  bottles, new containers and jars   FUNCTIONAL OUTCOME MEASURES: MAM-20 for musculoskeletal conditions: 41/80  UPPER EXTREMITY ROM:     Active ROM Right eval Left eval  Shoulder flexion    Shoulder abduction    Shoulder adduction    Shoulder extension  Shoulder internal rotation    Shoulder external rotation    Elbow flexion    Elbow extension    Wrist flexion 35 76  Wrist extension 49 60  Wrist ulnar deviation 45 31  Wrist radial deviation 21 33  Wrist pronation 90 90  Wrist supination 60 60  (Blank rows = not tested)  Active ROM Right eval Left eval  Thumb MCP (0-60) 40 50  Thumb IP (0-80) 35 40  Thumb Radial abd/add (0-55)     Thumb Palmar abd/add (0-45)     Thumb Opposition to Small Finger WFL WFL   Index MCP (0-90)     Index PIP (0-100)     Index DIP (0-70)      Long MCP (0-90)      Long PIP (0-100)      Long DIP (0-70)      Ring MCP (0-90)      Ring PIP (0-100)      Ring DIP (0-70)      Little MCP (0-90)      Little PIP (0-100)      Little DIP (0-70)      (Blank rows = not tested)  *L hand: 30* radial drift LF DIP joint, passively to neutral  *Pt reports L LF and R IF no longer triggering, but still sore Able to make full composite fist in each hand   UPPER EXTREMITY MMT:  TBD in follow up session   MMT Right eval Left eval  Shoulder flexion    Shoulder abduction    Shoulder  adduction    Shoulder extension    Shoulder internal rotation    Shoulder external rotation    Middle trapezius    Lower trapezius    Elbow flexion 4+ 4+  Elbow extension 4 4  Wrist flexion 5 4  Wrist extension 4+ 4+  Wrist ulnar deviation 4+ 4+  Wrist radial deviation 4+ 4+  Wrist pronation 4 4  Wrist supination 4 4  (Blank rows = not tested)      R thumb MP flexion 4-/5, radial abd 4-/5, radial add 4+/5, R thumb IP flexion/ext 4-/5   L thumb MP flexion 4-/5, radial abd 4-/5, radial add 4-/5, L thumb IP flexion/ext  4-/5   HAND FUNCTION: Grip strength: Right: 45 lbs; Left: 39 lbs, Lateral pinch: Right: 14 lbs, Left: 10 lbs, and 3 point pinch: Right: 10 lbs, Left: 8 lbs  COORDINATION: TBD in follow up session 9 Hole Peg test: Right: NT sec; Left: NT sec  SENSATION: Light touch: Impaired tingly in median nerve distribution of R/L hands,   EDEMA: No visible edema  COGNITION: Overall cognitive status: Within functional limits for tasks assessed                                                                                                                            TREATMENT DATE: 11/11/24:  Paraffin:  X10 min for R/L hand pain management/muscle  relaxation in prep for therapeutic exercises.  Hands wrapped in towels with moist heat pack over top.   Self Care: -Issued educational handouts with brief review on [facility] OA and joint protection strategies for hands; encouraged pt review and return with questions as needed -Completed MAM-20; see above scoring  Therapeutic Exercise: -Attempted distal median nerve glide on the RUE, with pt requiring max A for positioning.  Downgraded to wrist flexibility exercises as pt was unable to achieve ROM sufficient to complete nerve glide. 7 -Instructed pt in prayer stretch for passive ext of bilat wrists, and self passive wrist flexion stretch, working to increase flexibility/decrease pain from muscle stiffness.   -Continued recommendation for  use of theraputty to tolerance for bilat grip strengthening.  Pt to bring putty next session for OT to add to putty HEP.  PATIENT EDUCATION: Education details: Marketing executive; HEP progression Person educated: Patient Education method: Explanation, Demonstration, Tactile cues, Verbal cues, and Handouts Education comprehension: verbalized understanding, returned demonstration, verbal cues required, tactile cues required, and needs further education  HOME EXERCISE PROGRAM: Green theraputty, wrist flexibility   GOALS: Goals reviewed with patient? Yes  SHORT TERM GOALS: Target date: 12/15/24  Pt will be indep to perform HEP for improving bilat hand flexibility, strength, and coordination for daily tasks. Baseline: Eval: Not yet initiated Goal status: INITIAL  2.  Pt will be indep to verbalize 2-3 joint protection/cumulative trauma prevention strategies to reduce pain/paresthesias in the R/L hands.  Baseline: Eval: Educ not yet initiated Goal status: INITIAL  LONG TERM GOALS: Target date: 01/26/25  Pt will increase MAM-20 score for musculoskeletal conditions by 10 or more points to indicate improvement in self perceived functional use of the L hand for daily tasks.   Baseline: Eval: 41/80 Goal status: INITIAL  2.  Pt will tolerate manual therapy, therapeutic modalities, and exercises to decrease pain in B hands to a reported 3/10 pain or less with activity.  Baseline: Eval: 7-8/10 with activity  Goal status: INITIAL  3.  Pt will increase B grip strength by 5 or more lbs to ease ability to open containers.  Baseline: R 45 lbs, L 39 lbs Goal status: INITIAL  ASSESSMENT:  CLINICAL IMPRESSION: Initiated condition management education today with issue of written handouts; further review needed.  Good tolerance to moist heat today with paraffin and hot packs to bilat hands.  Pt reported moderate-severe pain levels in the R/L hands today, attributing pain to the cold weather, but  reports that she feels benefit from heating pad at home to manage pain.  Initiated passive wrist stretches d/t unsuccessful attempt to complete a median nerve glide on the R hand.  Pt will continue to benefit from skilled OT to address above noted deficits, focusing on pain management, activity modification, AE training, and development of HEP for improving strength, flexibility, and coordination in bilat hands to increase indep and tolerance to daily tasks.   PERFORMANCE DEFICITS: in functional skills including ADLs, IADLs, coordination, dexterity, sensation, edema, ROM, strength, pain, fascial restrictions, flexibility, Fine motor control, body mechanics, decreased knowledge of precautions, decreased knowledge of use of DME, skin integrity, and UE functional use, and psychosocial skills including coping strategies, environmental adaptation, habits, and routines and behaviors.   IMPAIRMENTS: are limiting patient from ADLs, IADLs, rest and sleep, leisure, and social participation.   COMORBIDITIES: has co-morbidities such as arthritis, carpal tunnel release, trigger finger release, neuropathy that affects occupational performance. Patient will benefit from skilled OT to address above impairments and improve  overall function.  MODIFICATION OR ASSISTANCE TO COMPLETE EVALUATION: Min-Moderate modification of tasks or assist with assess necessary to complete an evaluation.  OT OCCUPATIONAL PROFILE AND HISTORY: Detailed assessment: Review of records and additional review of physical, cognitive, psychosocial history related to current functional performance.  CLINICAL DECISION MAKING: Moderate - several treatment options, min-mod task modification necessary  REHAB POTENTIAL: Good  EVALUATION COMPLEXITY: High      PLAN:  OT FREQUENCY: 1-2x/week  OT DURATION: 12 weeks  PLANNED INTERVENTIONS: 97168 OT Re-evaluation, 97535 self care/ADL training, 02889 therapeutic exercise, 97530 therapeutic  activity, 97112 neuromuscular re-education, 97140 manual therapy, 97035 ultrasound, 97018 paraffin, 02989 moist heat, 97010 cryotherapy, 97034 contrast bath, 97750 Physical Performance Testing, 02239 Orthotic Initial, 97763 Orthotic/Prosthetic subsequent, passive range of motion, psychosocial skills training, energy conservation, coping strategies training, patient/family education, and DME and/or AE instructions  RECOMMENDED OTHER SERVICES: None at this time   CONSULTED AND AGREED WITH PLAN OF CARE: Patient  PLAN FOR NEXT SESSION: see above   Inocente Blazing, MS, OTR/L  Inocente MARLA Blazing, OT 11/11/2024, 1:00 PM   "

## 2024-11-17 ENCOUNTER — Ambulatory Visit

## 2024-11-20 ENCOUNTER — Ambulatory Visit: Admitting: Occupational Therapy

## 2024-11-20 DIAGNOSIS — M65321 Trigger finger, right index finger: Secondary | ICD-10-CM | POA: Insufficient documentation

## 2024-11-20 DIAGNOSIS — M180 Bilateral primary osteoarthritis of first carpometacarpal joints: Secondary | ICD-10-CM | POA: Insufficient documentation

## 2024-11-20 DIAGNOSIS — M25542 Pain in joints of left hand: Secondary | ICD-10-CM | POA: Insufficient documentation

## 2024-11-20 DIAGNOSIS — M79641 Pain in right hand: Secondary | ICD-10-CM | POA: Insufficient documentation

## 2024-11-20 DIAGNOSIS — M6281 Muscle weakness (generalized): Secondary | ICD-10-CM | POA: Insufficient documentation

## 2024-11-20 DIAGNOSIS — M65332 Trigger finger, left middle finger: Secondary | ICD-10-CM | POA: Insufficient documentation

## 2024-11-20 DIAGNOSIS — R278 Other lack of coordination: Secondary | ICD-10-CM | POA: Insufficient documentation

## 2024-11-20 DIAGNOSIS — M25541 Pain in joints of right hand: Secondary | ICD-10-CM | POA: Insufficient documentation

## 2024-11-20 NOTE — Therapy (Signed)
 " OUTPATIENT OCCUPATIONAL THERAPY ORTHO TREATMENT NOTE  Patient Name: Kristine Rangel MRN: [id number] DOB:[birth year], 69 y.o., female Today's Date: 11/20/2024  PCP: Dr. Rock Pounds REFERRING PROVIDER: Dr. Jackquline Barrack (orthopedist)  END OF SESSION:  OT End of Session - 11/20/24 0859     Visit Number 3    Number of Visits 24    Date for Recertification  01/26/25    OT Start Time 0845    OT Stop Time 0930    OT Time Calculation (min) 45 min    Activity Tolerance Patient tolerated treatment well    Behavior During Therapy [facility] for tasks assessed/performed         Past Medical History:  Diagnosis Date   (HFpEF) heart failure with preserved ejection fraction (HCC)    a.) TTE 03/03/2021: EF 60-65%, no RWMAs, mild LA dil, RVSP 32.6, G2DD; b.) TTE 05/17/2022: EF 60-65%, no RWMAs, normal RVSF, G1DD   Anemia    when she was younger   Anxiety    Arthritis    Asthma    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   Bladder leak    CAD (coronary artery disease) 09/11/2011   a.) LHC 09/11/2011: minor luminal irregulaties mLAD, mLCx, mRCA - med mgmt.   Cardiac murmur    Chest pain    a.) 10/07/2008 Neg [place] Echo; b.) LHC 09/11/2011 - minor lum irregs; no sig CAD; c.) MV 06/02/2015: no ischemia; d.) cCTA 02/2021: Ca2+ = 0. Normal coronaries   CKD (chronic kidney disease), stage III (HCC)    COPD (chronic obstructive pulmonary disease) (HCC)    Depression    Diet-controlled type 2 diabetes mellitus (HCC)    Dyspnea    Essential hypertension    GERD (gastroesophageal reflux disease)    Glaucoma    History of kidney stones    Hyperlipidemia    Insomnia    a.) takes suvorexant    MI (myocardial infarction) (HCC)    a.) in the 1990s; details unclear   Migraine with aura     Neuropathy    On apixaban  therapy    OSA on CPAP    Primary osteoarthritis of left hip    Rotator cuff injury    Seasonal allergies    Tendinitis of right rotator cuff    TIA (transient ischemic attack)    a. 05/2022 CTA head/neck: No significant stenosis; b. 05/2022 MRI Brain: No acute abnormality. Mild chronic small vessel ischemia.   Past Surgical History:  Procedure Laterality Date   BRAIN SURGERY Right 2010   anuerysm repair   CARPAL TUNNEL RELEASE Right 06/12/2017   Procedure: CARPAL TUNNEL RELEASE;  Surgeon: Marchia Drivers, MD;  Location: [facility];  Service: Orthopedics;  Laterality: Right;   CHOLECYSTECTOMY     COLONOSCOPY WITH PROPOFOL  N/A 08/09/2021   Procedure: COLONOSCOPY WITH PROPOFOL ;  Surgeon: Maryruth Ole DASEN, MD;  Location: [facility];  Service: Endoscopy;  Laterality: N/A;   ESOPHAGOGASTRODUODENOSCOPY (EGD) WITH PROPOFOL  N/A 08/09/2021   Procedure: ESOPHAGOGASTRODUODENOSCOPY (EGD) WITH PROPOFOL ;  Surgeon: Maryruth Ole DASEN, MD;  Location: [facility];  Service: Endoscopy;  Laterality: N/A;   LEFT HEART CATH AND CORONARY ANGIOGRAPHY Left 09/11/2011   Procedure: LEFT HEART CATH AND CORONARY ANGIOGRAPHY; Location: [facility]; Surgeon: Denyse Bathe, MD   REVERSE SHOULDER ARTHROPLASTY Right 08/02/2023   Procedure: REVERSE  SHOULDER ARTHROPLASTY;  Surgeon: Edie Norleen PARAS, MD;  Location: [facility];  Service: Orthopedics;  Laterality: Right;   SHOULDER ARTHROSCOPY WITH OPEN ROTATOR CUFF REPAIR Left 11/23/2016   Procedure: SHOULDER ARTHROSCOPY WITH OPEN ROTATOR CUFF REPAIR, ARTHROSCOPIC SUBACROMIAL DECOMPRESSION, DISTAL CLAVICLE EXCISION;  Surgeon: Franky Cranker, MD;  Location: [facility];  Service: Orthopedics;  Laterality: Left;   SHOULDER ARTHROSCOPY WITH OPEN ROTATOR CUFF REPAIR Right 07/30/2018   Procedure: SHOULDER ARTHROSCOPY WITH OPEN ROTATOR CUFF REPAIR,SUBACROMINAL DECOMPRESSION AND DISTAL CLAVICLE EXCISION;  Surgeon: Cranker Franky, MD;  Location: [facility];   Service: Orthopedics;  Laterality: Right;   TONSILLECTOMY     TOTAL HIP ARTHROPLASTY Right 02/21/2024   Procedure: ARTHROPLASTY, HIP, TOTAL,POSTERIOR APPROACH;  Surgeon: Edie Norleen PARAS, MD;  Location: [facility];  Service: Orthopedics;  Laterality: Right;   TOTAL HIP ARTHROPLASTY Left 07/03/2024   Procedure: ARTHROPLASTY, HIP, TOTAL,POSTERIOR APPROACH;  Surgeon: Edie Norleen PARAS, MD;  Location: [facility];  Service: Orthopedics;  Laterality: Left;   TOTAL KNEE ARTHROPLASTY Bilateral    TOTAL KNEE REVISION Right 05/05/2020   Procedure: right total knee arthroplasty revision;  Surgeon: Leora Lynwood SAUNDERS, MD;  Location: [facility];  Service: Orthopedics;  Laterality: Right;   Patient Active Problem List   Diagnosis Date Noted   Status post total hip replacement, left 07/03/2024   Status post total hip replacement, right 02/21/2024   Right sided weakness 05/15/2022   GERD (gastroesophageal reflux disease) 05/15/2022   Peripheral neuropathy 05/15/2022   Hyperlipidemia associated with type 2 diabetes mellitus (HCC) 05/15/2022   OSA (obstructive sleep apnea) 05/15/2022   Chronic heart failure with preserved ejection fraction (HFpEF) (HCC) 05/15/2022   Asthma, chronic 05/15/2022   Chronic kidney disease, stage 3a (HCC) 05/15/2022   Chest pain 02/05/2021   SOB (shortness of breath) 02/05/2021   Leg swelling 02/05/2021   Nonrheumatic mitral valve regurgitation 02/05/2021   Morbid obesity (HCC) 02/05/2021   S/P revision of total knee, right 05/05/2020   S/P right rotator cuff repair 07/30/2018   S/P left rotator cuff repair 11/23/2016   Essential hypertension 02/05/2003   ONSET DATE: 1.5 months worsening pain in hands.   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 Reports that the cold weather makes her hands very sore. Pt accompanied by: self  PERTINENT HISTORY: Per EMR from visit with Dr. Ezra on 10/23/24: History of Present Illness Nadirah Socorro Cronin is a 69 year old female with carpal tunnel syndrome and diabetes who presents with hand pain and swelling.   She experiences persistent pain and swelling in her hands, particularly in the fingers, for over a month. The pain is constant and accompanied by tingling, more pronounced in the fingers than the thumb.   She has a history of carpal tunnel surgeries on both hands, which provided relief, but the current symptoms differ, focusing more on finger pain. She also underwent trigger finger surgery on her left middle finger, which previously got stuck and required surgical intervention   She uses topical treatments like Voltaren gel and Taysofy with minimal relief and takes Tylenol  for arthritis, which provides  some relief but does not completely alleviate the pain.   The left hand is worse than the right, with the most severe pain in the middle finger. No current locking or popping of the fingers since the trigger finger surgery.   She has diabetes.   No numbness, but reports tingling and constant pain in the fingers. No significant pain in the thumb.   Prior medical records were reviewed.   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 Current symptoms include constant pain in fingers, particularly right index and left middle fingers, with possible trigger finger involvement. X-rays show arthritis at thumb bases and multiple finger joints.  Discussed steroid injections for symptom relief,  with risks including infection, allergic reaction, skin discoloration, and elevated blood sugar. Hand therapy may aid in range of motion and pain management. Warm gloves and thumb braces may provide additional support and pain relief. - Administered steroid injection to left hand middle finger and right hand index finger. - Provided thumb braces for support. - Recommended use of Voltaren gel at the base of the thumbs. - Continue with Tylenol /anti-inflammatories - Referral to therapy for range of motion and pain management modalities placed - Follow-up as needed  PRECAUTIONS: None  RED FLAGS: None   WEIGHT BEARING RESTRICTIONS: No  PAIN:  11/20/24: bilateral hands: 7/10 pain 11/11/24: L hand 7-8/10 pain, R 6/10 pain Are you having pain? Yes: NPRS scale: 7-8, occasionally neck pain that can reach 6-7 1 or 2 x per week  Pain location: bilat hands, L tends to be worse than the R Pain description: achy, irritating, pins and needles  Aggravating factors: activity  Relieving factors: heat, rest, Tramadol , B wrist braces with thumb component to stabilize MCPs, wears braces on/off throughout the day but consistently at night time   FALLS: Has patient fallen in last 6 months? No  LIVING ENVIRONMENT: Lives with: lives alone Lives in: ground level apartment  Stairs: No Has following equipment at home: Vannie - 4 wheeled, shower chair, bed side commode, and Grab bars (3in1 is propped over the toilet) Grab bar by toilet and in shower  PLOF: Needs assistance with ADLs, has paid caregiver 7 days a week who helps with bathing, dressing, shopping, IADLs   PATIENT GOALS: That my hands get better. Decrease pain, be able to pick up things without dropping and breaking them.  NEXT MD VISIT: Follow up with Dr. Ezra as needed.  OBJECTIVE:  Note: Objective measures were completed at Evaluation unless otherwise noted.  HAND DOMINANCE: Right  ADLs: CNA assist 7 days a week Eating: assist to cut  food  Grooming: difficulty squeezing denture paste and toothpaste, can be difficult to hold a comb or brush Upper body dressing: difficulty with clothing fasteners  Lower body dressing: difficulty with clothing fasteners Toileting: Occasional assist for thorough peri care  Bathing: CNA assists Meal prep: Difficulty opening water  bottles, new containers and jars   FUNCTIONAL OUTCOME MEASURES: MAM-20 for musculoskeletal conditions: 41/80  UPPER EXTREMITY ROM:     Active ROM Right eval Left eval  Shoulder flexion    Shoulder abduction    Shoulder adduction    Shoulder extension    Shoulder internal rotation    Shoulder external rotation    Elbow flexion    Elbow extension    Wrist flexion 35 76  Wrist extension 49 60  Wrist ulnar deviation 45 31  Wrist radial deviation 21 33  Wrist pronation 90 90  Wrist supination 60 60  (Blank rows = not tested)  Active ROM Right eval Left eval  Thumb MCP (0-60) 40 50  Thumb IP (0-80) 35 40  Thumb Radial abd/add (0-55)     Thumb Palmar abd/add (0-45)     Thumb Opposition to Small Finger WFL WFL   Index MCP (0-90)     Index PIP (0-100)     Index DIP (0-70)      Long MCP (0-90)      Long PIP (0-100)      Long DIP (0-70)      Ring MCP (0-90)      Ring PIP (0-100)      Ring DIP (0-70)      Little MCP (0-90)      Little PIP (0-100)      Little DIP (0-70)      (Blank rows = not tested)  *L hand: 30* radial drift LF DIP joint, passively to neutral  *Pt reports L LF and R IF no longer triggering, but still sore Able to make full composite fist in each hand   UPPER EXTREMITY MMT:  TBD in follow up session   MMT Right eval Left eval  Shoulder flexion    Shoulder abduction    Shoulder adduction    Shoulder extension    Shoulder internal rotation    Shoulder external rotation    Middle trapezius    Lower trapezius    Elbow flexion 4+ 4+  Elbow extension 4 4  Wrist flexion 5 4  Wrist extension 4+ 4+  Wrist ulnar deviation  4+ 4+  Wrist radial deviation 4+ 4+  Wrist pronation 4 4  Wrist supination 4 4  (Blank rows = not tested)      R thumb MP flexion 4-/5, radial abd 4-/5, radial add 4+/5, R thumb IP flexion/ext 4-/5   L thumb MP flexion 4-/5, radial abd 4-/5, radial add 4-/5, L thumb IP flexion/ext  4-/5   HAND FUNCTION: Grip strength: Right: 45 lbs; Left: 39 lbs, Lateral pinch: Right: 14 lbs, Left: 10 lbs, and 3 point pinch: Right: 10 lbs, Left: 8 lbs  COORDINATION: TBD in follow up session 9 Hole Peg test: Right: NT sec; Left: NT sec  SENSATION: Light touch: Impaired tingly in median nerve distribution of R/L hands,   EDEMA: No visible edema  COGNITION: Overall cognitive status: Within functional limits for tasks assessed                                                                                                                            TREATMENT DATE: 11/20/24  Paraffin:  X10 min for R/L hand pain management/muscle relaxation in prep for therapeutic exercises.  Hands wrapped in towels with moist heat pack over top.   Self Care: -Issued educational handouts with brief review on [facility] OA and joint protection strategies for hands; encouraged pt review and return with questions as  needed -Completed MAM-20; see above scoring  Therapeutic Exercise: -Reviewed distal median nerve glide on the RUE, with pt requiring max A for positioning, and increased consistent cues for for mirroring every aspect of the movement. -Reviewed tendon gliding exercises with step by step cues with mirroring.  PATIENT EDUCATION: Education details: median nerve glides, condition management. Person educated: Patient Education method: Explanation, Demonstration, Tactile cues, Verbal cues, and Handouts Education comprehension: verbalized understanding, returned demonstration, verbal cues required, tactile cues required, and needs further education  HOME EXERCISE PROGRAM: Green theraputty, wrist flexibility. Median  nerve glides  GOALS: Goals reviewed with patient? Yes  SHORT TERM GOALS: Target date: 12/15/24  Pt will be indep to perform HEP for improving bilat hand flexibility, strength, and coordination for daily tasks. Baseline: Eval: Not yet initiated Goal status: INITIAL  2.  Pt will be indep to verbalize 2-3 joint protection/cumulative trauma prevention strategies to reduce pain/paresthesias in the R/L hands.  Baseline: Eval: Educ not yet initiated Goal status: INITIAL  LONG TERM GOALS: Target date: 01/26/25  Pt will increase MAM-20 score for musculoskeletal conditions by 10 or more points to indicate improvement in self perceived functional use of the L hand for daily tasks.   Baseline: Eval: 41/80 Goal status: INITIAL  2.  Pt will tolerate manual therapy, therapeutic modalities, and exercises to decrease pain in B hands to a reported 3/10 pain or less with activity.  Baseline: Eval: 7-8/10 with activity  Goal status: INITIAL  3.  Pt will increase B grip strength by 5 or more lbs to ease ability to open containers.  Baseline: R 45 lbs, L 39 lbs Goal status: INITIAL  ASSESSMENT:  CLINICAL IMPRESSION:  Pt. reports 7/10 pain in her bilateral hands. Pt. responded well to the paraffin, manual therapy, and exercises/stretches well today, reporting that she feels better following. Pt. requires max cues with consistency when mirroring each of the exercises for the direction of hand position. Pt. continues to benefit from skilled OT to address above noted deficits, focusing on pain management, activity modification, AE training, and development of HEP for improving strength, flexibility, and coordination in bilat hands to increase indep and tolerance to daily tasks.   PERFORMANCE DEFICITS: in functional skills including ADLs, IADLs, coordination, dexterity, sensation, edema, ROM, strength, pain, fascial restrictions, flexibility, Fine motor control, body mechanics, decreased knowledge of  precautions, decreased knowledge of use of DME, skin integrity, and UE functional use, and psychosocial skills including coping strategies, environmental adaptation, habits, and routines and behaviors.   IMPAIRMENTS: are limiting patient from ADLs, IADLs, rest and sleep, leisure, and social participation.   COMORBIDITIES: has co-morbidities such as arthritis, carpal tunnel release, trigger finger release, neuropathy that affects occupational performance. Patient will benefit from skilled OT to address above impairments and improve overall function.  MODIFICATION OR ASSISTANCE TO COMPLETE EVALUATION: Min-Moderate modification of tasks or assist with assess necessary to complete an evaluation.  OT OCCUPATIONAL PROFILE AND HISTORY: Detailed assessment: Review of records and additional review of physical, cognitive, psychosocial history related to current functional performance.  CLINICAL DECISION MAKING: Moderate - several treatment options, min-mod task modification necessary  REHAB POTENTIAL: Good  EVALUATION COMPLEXITY: High      PLAN:  OT FREQUENCY: 1-2x/week  OT DURATION: 12 weeks  PLANNED INTERVENTIONS: 97168 OT Re-evaluation, 97535 self care/ADL training, 02889 therapeutic exercise, 97530 therapeutic activity, 97112 neuromuscular re-education, 97140 manual therapy, 97035 ultrasound, 97018 paraffin, 02989 moist heat, 97010 cryotherapy, 97034 contrast bath, 97750 Physical Performance Testing, 02239 Orthotic Initial, [id number]  Orthotic/Prosthetic subsequent, passive range of motion, psychosocial skills training, energy conservation, coping strategies training, patient/family education, and DME and/or AE instructions  RECOMMENDED OTHER SERVICES: None at this time   CONSULTED AND AGREED WITH PLAN OF CARE: Patient  PLAN FOR NEXT SESSION: see above   Richardson Otter, MS, OTR/L   11/20/2024, 10:45 AM   "

## 2024-11-24 ENCOUNTER — Ambulatory Visit

## 2024-11-24 DIAGNOSIS — M6281 Muscle weakness (generalized): Secondary | ICD-10-CM

## 2024-11-24 DIAGNOSIS — M65321 Trigger finger, right index finger: Secondary | ICD-10-CM

## 2024-11-24 DIAGNOSIS — R278 Other lack of coordination: Secondary | ICD-10-CM

## 2024-11-24 DIAGNOSIS — M25541 Pain in joints of right hand: Secondary | ICD-10-CM

## 2024-11-24 DIAGNOSIS — M180 Bilateral primary osteoarthritis of first carpometacarpal joints: Secondary | ICD-10-CM

## 2024-11-24 DIAGNOSIS — M65332 Trigger finger, left middle finger: Secondary | ICD-10-CM

## 2024-11-24 NOTE — Therapy (Signed)
 " OUTPATIENT OCCUPATIONAL THERAPY ORTHO TREATMENT NOTE  Patient Name: Kristine Rangel MRN: [id number] DOB:1956/03/27, 69 y.o., female Today's Date: 11/24/2024  PCP: Dr. Rock Pounds REFERRING PROVIDER: Dr. Jackquline Barrack (orthopedist)  END OF SESSION:  OT End of Session - 11/24/24 0852     Visit Number 4    Number of Visits 24    Date for Recertification  01/26/25    Progress Note Due on Visit 10    OT Start Time 0845    OT Stop Time 0930    OT Time Calculation (min) 45 min    Activity Tolerance Patient tolerated treatment well    Behavior During Therapy [facility] for tasks assessed/performed         Past Medical History:  Diagnosis Date   (HFpEF) heart failure with preserved ejection fraction (HCC)    a.) TTE 03/03/2021: EF 60-65%, no RWMAs, mild LA dil, RVSP 32.6, G2DD; b.) TTE 05/17/2022: EF 60-65%, no RWMAs, normal RVSF, G1DD   Anemia    when she was younger   Anxiety    Arthritis    Asthma    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   Bladder leak    CAD (coronary artery disease) 09/11/2011   a.) LHC 09/11/2011: minor luminal irregulaties mLAD, mLCx, mRCA - med mgmt.   Cardiac murmur    Chest pain    a.) 10/07/2008 Neg [place] Echo; b.) LHC 09/11/2011 - minor lum irregs; no sig CAD; c.) MV 06/02/2015: no ischemia; d.) cCTA 02/2021: Ca2+ = 0. Normal coronaries   CKD (chronic kidney disease), stage III (HCC)    COPD (chronic obstructive pulmonary disease) (HCC)    Depression    Diet-controlled type 2 diabetes mellitus (HCC)    Dyspnea    Essential hypertension    GERD (gastroesophageal reflux disease)    Glaucoma    History of kidney stones    Hyperlipidemia    Insomnia    a.) takes suvorexant    MI (myocardial infarction) (HCC)    a.) in the 1990s; details  unclear   Migraine with aura    Neuropathy    On apixaban  therapy    OSA on CPAP    Primary osteoarthritis of left hip    Rotator cuff injury    Seasonal allergies    Tendinitis of right rotator cuff    TIA (transient ischemic attack)    a. 05/2022 CTA head/neck: No significant stenosis; b. 05/2022 MRI Brain: No acute abnormality. Mild chronic small vessel ischemia.   Past Surgical History:  Procedure Laterality Date   BRAIN SURGERY Right 2010   anuerysm repair   CARPAL TUNNEL RELEASE Right 06/12/2017   Procedure: CARPAL TUNNEL RELEASE;  Surgeon: Marchia Drivers, MD;  Location: [facility];  Service: Orthopedics;  Laterality: Right;   CHOLECYSTECTOMY     COLONOSCOPY WITH PROPOFOL  N/A 08/09/2021   Procedure: COLONOSCOPY WITH PROPOFOL ;  Surgeon: Maryruth Ole DASEN, MD;  Location: [facility];  Service: Endoscopy;  Laterality: N/A;   ESOPHAGOGASTRODUODENOSCOPY (EGD) WITH PROPOFOL  N/A 08/09/2021   Procedure: ESOPHAGOGASTRODUODENOSCOPY (EGD) WITH PROPOFOL ;  Surgeon: Maryruth Ole DASEN, MD;  Location: [facility];  Service: Endoscopy;  Laterality: N/A;   LEFT HEART CATH AND CORONARY ANGIOGRAPHY Left 09/11/2011   Procedure: LEFT HEART CATH AND CORONARY ANGIOGRAPHY; Location: [facility]; Surgeon: Denyse Bathe, MD  REVERSE SHOULDER ARTHROPLASTY Right 08/02/2023   Procedure: REVERSE SHOULDER ARTHROPLASTY;  Surgeon: Edie Norleen PARAS, MD;  Location: [facility];  Service: Orthopedics;  Laterality: Right;   SHOULDER ARTHROSCOPY WITH OPEN ROTATOR CUFF REPAIR Left 11/23/2016   Procedure: SHOULDER ARTHROSCOPY WITH OPEN ROTATOR CUFF REPAIR, ARTHROSCOPIC SUBACROMIAL DECOMPRESSION, DISTAL CLAVICLE EXCISION;  Surgeon: Franky Cranker, MD;  Location: [facility];  Service: Orthopedics;  Laterality: Left;   SHOULDER ARTHROSCOPY WITH OPEN ROTATOR CUFF REPAIR Right 07/30/2018   Procedure: SHOULDER ARTHROSCOPY WITH OPEN ROTATOR CUFF REPAIR,SUBACROMINAL DECOMPRESSION AND DISTAL CLAVICLE EXCISION;  Surgeon: Cranker Franky, MD;  Location: [facility];  Service: Orthopedics;  Laterality: Right;   TONSILLECTOMY     TOTAL HIP ARTHROPLASTY Right 02/21/2024   Procedure: ARTHROPLASTY, HIP, TOTAL,POSTERIOR APPROACH;  Surgeon: Edie Norleen PARAS, MD;  Location: [facility];  Service: Orthopedics;  Laterality: Right;   TOTAL HIP ARTHROPLASTY Left 07/03/2024   Procedure: ARTHROPLASTY, HIP, TOTAL,POSTERIOR APPROACH;  Surgeon: Edie Norleen PARAS, MD;  Location: [facility];  Service: Orthopedics;  Laterality: Left;   TOTAL KNEE ARTHROPLASTY Bilateral    TOTAL KNEE REVISION Right 05/05/2020   Procedure: right total knee arthroplasty revision;  Surgeon: Leora Lynwood SAUNDERS, MD;  Location: [facility];  Service: Orthopedics;  Laterality: Right;   Patient Active Problem List   Diagnosis Date Noted   Status post total hip replacement, left 07/03/2024   Status post total hip replacement, right 02/21/2024   Right sided weakness 05/15/2022   GERD (gastroesophageal reflux disease) 05/15/2022   Peripheral neuropathy 05/15/2022   Hyperlipidemia associated with type 2 diabetes mellitus (HCC) 05/15/2022   OSA (obstructive sleep apnea) 05/15/2022   Chronic heart failure with preserved ejection fraction (HFpEF) (HCC) 05/15/2022   Asthma, chronic 05/15/2022   Chronic kidney disease, stage 3a (HCC) 05/15/2022   Chest pain 02/05/2021   SOB (shortness of breath) 02/05/2021   Leg swelling 02/05/2021   Nonrheumatic mitral valve regurgitation 02/05/2021   Morbid obesity (HCC) 02/05/2021   S/P revision of total knee, right 05/05/2020   S/P right rotator cuff repair 07/30/2018   S/P left rotator cuff repair 11/23/2016   Essential hypertension 02/05/2003   ONSET DATE: 1.5 months worsening pain in hands.   REFERRING DIAG:  M25.542 (ICD-10-CM) - Pain in joints of left hand  M25.541 (ICD-10-CM) - Pain in joints of right hand  M18.0 (ICD-10-CM) - Bilateral primary osteoarthritis of first carpometacarpal joints  M65.332 (ICD-10-CM) - Trigger finger, left  middle finger  M65.321 (ICD-10-CM) - Trigger finger, right index finger   THERAPY DIAG:  Muscle weakness (generalized)  Other lack of coordination  Joint pain in both hands  Trigger finger, left middle finger  Trigger finger, right index finger  Primary osteoarthritis of first carpometacarpal joints, bilateral  Rationale for Evaluation and Treatment: Rehabilitation  SUBJECTIVE:   SUBJECTIVE STATEMENT: Pt reports she's been working on stretching her wrists and fingers at home and using her heating pad to manage pain in the hands.  Pt accompanied by: self  PERTINENT HISTORY: Per EMR from visit with Dr. Ezra on 10/23/24: History of Present Illness Kristine Rangel is a 69 year old female with carpal tunnel syndrome and diabetes who presents with hand pain and swelling.   She experiences persistent pain and swelling in her hands, particularly in the fingers, for over a month. The pain is constant and accompanied by tingling, more pronounced in the fingers than the thumb.   She has a history of carpal tunnel surgeries on both hands, which provided relief, but  the current symptoms differ, focusing more on finger pain. She also underwent trigger finger surgery on her left middle finger, which previously got stuck and required surgical intervention   She uses topical treatments like Voltaren gel and Taysofy with minimal relief and takes Tylenol  for arthritis, which provides some relief but does not completely alleviate the pain.   The left hand is worse than the right, with the most severe pain in the middle finger. No current locking or popping of the fingers since the trigger finger surgery.   She has diabetes.   No numbness, but reports tingling and constant pain in the fingers. No significant pain in the thumb.   Prior medical records were reviewed.   Assessment & Plan Hand osteoarthritis with bilateral trigger fingers and thumb carpometacarpal arthritis Chronic hand  pain primarily affecting fingers. Current symptoms include constant pain in fingers, particularly right index and left middle fingers, with possible trigger finger involvement. X-rays show arthritis at thumb bases and multiple finger joints.  Discussed steroid injections for symptom relief, with risks including infection, allergic reaction, skin discoloration, and elevated blood sugar. Hand therapy may aid in range of motion and pain management. Warm gloves and thumb braces may provide additional support and pain relief. - Administered steroid injection to left hand middle finger and right hand index finger. - Provided thumb braces for support. - Recommended use of Voltaren gel at the base of the thumbs. - Continue with Tylenol /anti-inflammatories - Referral to therapy for range of motion and pain management modalities placed - Follow-up as needed  PRECAUTIONS: None  RED FLAGS: None   WEIGHT BEARING RESTRICTIONS: No  PAIN:  11/24/24: bilateral hands: 7/10 pain, reduced only slightly to 6/10 end of session 11/11/24: L hand 7-8/10 pain, R 6/10 pain Are you having pain? Yes: NPRS scale: 7-8, occasionally neck pain that can reach 6-7 1 or 2 x per week  Pain location: bilat hands, L tends to be worse than the R Pain description: achy, irritating, pins and needles  Aggravating factors: activity  Relieving factors: heat, rest, Tramadol , B wrist braces with thumb component to stabilize MCPs, wears braces on/off throughout the day but consistently at night time   FALLS: Has patient fallen in last 6 months? No  LIVING ENVIRONMENT: Lives with: lives alone Lives in: ground level apartment  Stairs: No Has following equipment at home: Vannie - 4 wheeled, shower chair, bed side commode, and Grab bars (3in1 is propped over the toilet) Grab bar by toilet and in shower  PLOF: Needs assistance with ADLs, has paid caregiver 7 days a week who helps with bathing, dressing, shopping, IADLs   PATIENT GOALS:  That my hands get better. Decrease pain, be able to pick up things without dropping and breaking them.  NEXT MD VISIT: Follow up with Dr. Ezra as needed.  OBJECTIVE:  Note: Objective measures were completed at Evaluation unless otherwise noted.  HAND DOMINANCE: Right  ADLs: CNA assist 7 days a week Eating: assist to cut food  Grooming: difficulty squeezing denture paste and toothpaste, can be difficult to hold a comb or brush Upper body dressing: difficulty with clothing fasteners  Lower body dressing: difficulty with clothing fasteners Toileting: Occasional assist for thorough peri care  Bathing: CNA assists Meal prep: Difficulty opening water  bottles, new containers and jars   FUNCTIONAL OUTCOME MEASURES: MAM-20 for musculoskeletal conditions: 41/80  UPPER EXTREMITY ROM:     Active ROM Right eval Left eval  Shoulder flexion    Shoulder abduction  Shoulder adduction    Shoulder extension    Shoulder internal rotation    Shoulder external rotation    Elbow flexion    Elbow extension    Wrist flexion 35 76  Wrist extension 49 60  Wrist ulnar deviation 45 31  Wrist radial deviation 21 33  Wrist pronation 90 90  Wrist supination 60 60  (Blank rows = not tested)  Active ROM Right eval Left eval  Thumb MCP (0-60) 40 50  Thumb IP (0-80) 35 40  Thumb Radial abd/add (0-55)     Thumb Palmar abd/add (0-45)     Thumb Opposition to Small Finger WFL WFL   Index MCP (0-90)     Index PIP (0-100)     Index DIP (0-70)      Long MCP (0-90)      Long PIP (0-100)      Long DIP (0-70)      Ring MCP (0-90)      Ring PIP (0-100)      Ring DIP (0-70)      Little MCP (0-90)      Little PIP (0-100)      Little DIP (0-70)      (Blank rows = not tested)  *L hand: 30* radial drift LF DIP joint, passively to neutral  *Pt reports L LF and R IF no longer triggering, but still sore Able to make full composite fist in each hand   UPPER EXTREMITY MMT:  TBD in follow up session    MMT Right eval Left eval  Shoulder flexion    Shoulder abduction    Shoulder adduction    Shoulder extension    Shoulder internal rotation    Shoulder external rotation    Middle trapezius    Lower trapezius    Elbow flexion 4+ 4+  Elbow extension 4 4  Wrist flexion 5 4  Wrist extension 4+ 4+  Wrist ulnar deviation 4+ 4+  Wrist radial deviation 4+ 4+  Wrist pronation 4 4  Wrist supination 4 4  (Blank rows = not tested)      R thumb MP flexion 4-/5, radial abd 4-/5, radial add 4+/5, R thumb IP flexion/ext 4-/5   L thumb MP flexion 4-/5, radial abd 4-/5, radial add 4-/5, L thumb IP flexion/ext  4-/5   HAND FUNCTION: Grip strength: Right: 45 lbs; Left: 39 lbs, Lateral pinch: Right: 14 lbs, Left: 10 lbs, and 3 point pinch: Right: 10 lbs, Left: 8 lbs  COORDINATION: TBD in follow up session 9 Hole Peg test: Right: NT sec; Left: NT sec  SENSATION: Light touch: Impaired tingly in median nerve distribution of R/L hands,   EDEMA: No visible edema  COGNITION: Overall cognitive status: Within functional limits for tasks assessed                                                                                                                            TREATMENT DATE: 11/24/24  Moist heat applied to R/L hands x 5 min for pain reduction/muscle relaxation in prep for ROM and manual therapy noted below.  Self Care: -Review of conservative pain management strategies, including heat (microwavable heating pads/electric heating pads), daily stretching, and completing HEP within pain free ranges.   -Reviewed frequency of digits on R/L hands triggering; pt reports no triggering in any digits since injections were done. -Reviewed tolerance for theraputty; pt reports no increases in pain with use during of after.  Encouraged pt continue with putty for increasing hand strength and helping with joint lubrication for ROM.  Manual Therapy: -Soft tissue massage to each joint in bilat thumbs,  thenar and hypothenar eminence R/L, working to increase tissue lengthening for flat hand. -STM to IP, MP, CMC thumb joints bilaterally, and 3rd and 4th digit PIP joints bilaterally for increasing circulation/decreasing pain -STM to flexors in the palm d/t tenderness and stiffness, and tenderness at the base of the MCPs  Therapeutic Exercise: -Reviewed active cervical ROM and scapular mobility all planes with good return demo -Reviewed benefits of wrist and digit passive and active flex/ext exercises for increasing joint ranges to better engage hands into daily tasks.  -Performed passive stretching to bilat wrists, including gentle joint distraction at the wrist to passive stretch into pain free ranges of flex/ext/RD/UD to each hand.   -Performed bilat thumb passive abd (repeat vc for relaxation to reduce guarding), IP flex/ext with joint blocking, and MP flex/ext.   -Performed passive full fisting and digit ext of all digits in each hand, with OT assist to maximize end ranges at each joint, specifically in R/L 2nd digits d/t MCP joint stiffness bilaterally.   PATIENT EDUCATION: Education details: Pain management strategies, HEP review Person educated: Patient Education method: Explanation, Demonstration, Tactile cues, Verbal cues, and Handouts Education comprehension: verbalized understanding, returned demonstration, verbal cues required, tactile cues required, and needs further education  HOME EXERCISE PROGRAM: Green theraputty, wrist flexibility. Median nerve glides  GOALS: Goals reviewed with patient? Yes  SHORT TERM GOALS: Target date: 12/15/24  Pt will be indep to perform HEP for improving bilat hand flexibility, strength, and coordination for daily tasks. Baseline: Eval: Not yet initiated Goal status: INITIAL  2.  Pt will be indep to verbalize 2-3 joint protection/cumulative trauma prevention strategies to reduce pain/paresthesias in the R/L hands.  Baseline: Eval: Educ not yet  initiated Goal status: INITIAL  LONG TERM GOALS: Target date: 01/26/25  Pt will increase MAM-20 score for musculoskeletal conditions by 10 or more points to indicate improvement in self perceived functional use of the L hand for daily tasks.   Baseline: Eval: 41/80 Goal status: INITIAL  2.  Pt will tolerate manual therapy, therapeutic modalities, and exercises to decrease pain in B hands to a reported 3/10 pain or less with activity.  Baseline: Eval: 7-8/10 with activity  Goal status: INITIAL  3.  Pt will increase B grip strength by 5 or more lbs to ease ability to open containers.  Baseline: R 45 lbs, L 39 lbs Goal status: INITIAL  ASSESSMENT:  CLINICAL IMPRESSION: Pt reports she's been working on stretching her wrists and fingers at home and using her heating pad to manage pain in the hands.  Pt. reports 7/10 pain in her bilateral hands today, reducing only slightly by end of session after manual therapy and therapeutic exercises noted above.  Pt continues to have tenderness in the flexors in the palm, limited active thumb flexion, limited MCP flexion in 2nd digits of each  hand, and tenderness in the 3rd and 4th digit PIP joints in each hand.  Pt reports that she's been tolerating theraputty well.  OT encouraged pt continue to work with putty and included benefits of continued wrist flexibility exercises, as well as cervical and scapular mobility.  Pt verbalized and demonstrated understanding of exercises and education provided today.  Pt. continues to benefit from skilled OT to address above noted deficits, focusing on pain management, activity modification, AE training, and development of HEP for improving strength, flexibility, and coordination in bilat hands to increase indep and tolerance to daily tasks.   PERFORMANCE DEFICITS: in functional skills including ADLs, IADLs, coordination, dexterity, sensation, edema, ROM, strength, pain, fascial restrictions, flexibility, Fine motor control,  body mechanics, decreased knowledge of precautions, decreased knowledge of use of DME, skin integrity, and UE functional use, and psychosocial skills including coping strategies, environmental adaptation, habits, and routines and behaviors.   IMPAIRMENTS: are limiting patient from ADLs, IADLs, rest and sleep, leisure, and social participation.   COMORBIDITIES: has co-morbidities such as arthritis, carpal tunnel release, trigger finger release, neuropathy that affects occupational performance. Patient will benefit from skilled OT to address above impairments and improve overall function.  MODIFICATION OR ASSISTANCE TO COMPLETE EVALUATION: Min-Moderate modification of tasks or assist with assess necessary to complete an evaluation.  OT OCCUPATIONAL PROFILE AND HISTORY: Detailed assessment: Review of records and additional review of physical, cognitive, psychosocial history related to current functional performance.  CLINICAL DECISION MAKING: Moderate - several treatment options, min-mod task modification necessary  REHAB POTENTIAL: Good  EVALUATION COMPLEXITY: High      PLAN:  OT FREQUENCY: 1-2x/week  OT DURATION: 12 weeks  PLANNED INTERVENTIONS: 97168 OT Re-evaluation, 97535 self care/ADL training, 02889 therapeutic exercise, 97530 therapeutic activity, 97112 neuromuscular re-education, 97140 manual therapy, 97035 ultrasound, 97018 paraffin, 02989 moist heat, 97010 cryotherapy, 97034 contrast bath, 97750 Physical Performance Testing, 02239 Orthotic Initial, 97763 Orthotic/Prosthetic subsequent, passive range of motion, psychosocial skills training, energy conservation, coping strategies training, patient/family education, and DME and/or AE instructions  RECOMMENDED OTHER SERVICES: None at this time   CONSULTED AND AGREED WITH PLAN OF CARE: Patient  PLAN FOR NEXT SESSION: see above   Inocente Blazing, MS, OTR/L   11/24/2024, 10:01 AM   "

## 2024-11-28 ENCOUNTER — Ambulatory Visit

## 2024-11-28 DIAGNOSIS — M65332 Trigger finger, left middle finger: Secondary | ICD-10-CM

## 2024-11-28 DIAGNOSIS — R278 Other lack of coordination: Secondary | ICD-10-CM

## 2024-11-28 DIAGNOSIS — M6281 Muscle weakness (generalized): Secondary | ICD-10-CM

## 2024-11-28 DIAGNOSIS — M25542 Pain in joints of left hand: Secondary | ICD-10-CM

## 2024-11-28 DIAGNOSIS — M65321 Trigger finger, right index finger: Secondary | ICD-10-CM

## 2024-11-28 DIAGNOSIS — M180 Bilateral primary osteoarthritis of first carpometacarpal joints: Secondary | ICD-10-CM

## 2024-11-28 NOTE — Therapy (Unsigned)
 " OUTPATIENT OCCUPATIONAL THERAPY ORTHO TREATMENT NOTE  Patient Name: Kristine Rangel MRN: [id number] DOB:[birth year], 69 y.o., female Today's Date: 11/29/2024  PCP: Dr. Rock Pounds REFERRING PROVIDER: Dr. Jackquline Barrack (orthopedist)  END OF SESSION:  OT End of Session - 11/29/24 2021     Visit Number 5    Number of Visits 24    Date for Recertification  01/26/25    Progress Note Due on Visit 10    OT Start Time 0816    OT Stop Time 0845    OT Time Calculation (min) 29 min    Activity Tolerance Patient tolerated treatment well    Behavior During Therapy [facility] for tasks assessed/performed          Past Medical History:  Diagnosis Date   (HFpEF) heart failure with preserved ejection fraction (HCC)    a.) TTE 03/03/2021: EF 60-65%, no RWMAs, mild LA dil, RVSP 32.6, G2DD; b.) TTE 05/17/2022: EF 60-65%, no RWMAs, normal RVSF, G1DD   Anemia    when she was younger   Anxiety    Arthritis    Asthma    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   Bladder leak    CAD (coronary artery disease) 09/11/2011   a.) LHC 09/11/2011: minor luminal irregulaties mLAD, mLCx, mRCA - med mgmt.   Cardiac murmur    Chest pain    a.) 10/07/2008 Neg [place] Echo; b.) LHC 09/11/2011 - minor lum irregs; no sig CAD; c.) MV 06/02/2015: no ischemia; d.) cCTA 02/2021: Ca2+ = 0. Normal coronaries   CKD (chronic kidney disease), stage III (HCC)    COPD (chronic obstructive pulmonary disease) (HCC)    Depression    Diet-controlled type 2 diabetes mellitus (HCC)    Dyspnea    Essential hypertension    GERD (gastroesophageal reflux disease)    Glaucoma    History of kidney stones    Hyperlipidemia    Insomnia    a.) takes suvorexant    MI (myocardial infarction) (HCC)    a.) in the 1990s; details  unclear   Migraine with aura    Neuropathy    On apixaban  therapy    OSA on CPAP    Primary osteoarthritis of left hip    Rotator cuff injury    Seasonal allergies    Tendinitis of right rotator cuff    TIA (transient ischemic attack)    a. 05/2022 CTA head/neck: No significant stenosis; b. 05/2022 MRI Brain: No acute abnormality. Mild chronic small vessel ischemia.   Past Surgical History:  Procedure Laterality Date   BRAIN SURGERY Right 2010   anuerysm repair   CARPAL TUNNEL RELEASE Right 06/12/2017   Procedure: CARPAL TUNNEL RELEASE;  Surgeon: Marchia Drivers, MD;  Location: [facility];  Service: Orthopedics;  Laterality: Right;   CHOLECYSTECTOMY     COLONOSCOPY WITH PROPOFOL  N/A 08/09/2021   Procedure: COLONOSCOPY WITH PROPOFOL ;  Surgeon: Maryruth Ole DASEN, MD;  Location: [facility];  Service: Endoscopy;  Laterality: N/A;   ESOPHAGOGASTRODUODENOSCOPY (EGD) WITH PROPOFOL  N/A 08/09/2021   Procedure: ESOPHAGOGASTRODUODENOSCOPY (EGD) WITH PROPOFOL ;  Surgeon: Maryruth Ole DASEN, MD;  Location: [facility];  Service: Endoscopy;  Laterality: N/A;   LEFT HEART CATH AND CORONARY ANGIOGRAPHY Left 09/11/2011   Procedure: LEFT HEART CATH AND CORONARY ANGIOGRAPHY; Location: [facility]; Surgeon: Denyse Bathe, MD  REVERSE SHOULDER ARTHROPLASTY Right 08/02/2023   Procedure: REVERSE SHOULDER ARTHROPLASTY;  Surgeon: Edie Norleen PARAS, MD;  Location: [facility];  Service: Orthopedics;  Laterality: Right;   SHOULDER ARTHROSCOPY WITH OPEN ROTATOR CUFF REPAIR Left 11/23/2016   Procedure: SHOULDER ARTHROSCOPY WITH OPEN ROTATOR CUFF REPAIR, ARTHROSCOPIC SUBACROMIAL DECOMPRESSION, DISTAL CLAVICLE EXCISION;  Surgeon: Franky Cranker, MD;  Location: [facility];  Service: Orthopedics;  Laterality: Left;   SHOULDER ARTHROSCOPY WITH OPEN ROTATOR CUFF REPAIR Right 07/30/2018   Procedure: SHOULDER ARTHROSCOPY WITH OPEN ROTATOR CUFF REPAIR,SUBACROMINAL DECOMPRESSION AND DISTAL CLAVICLE EXCISION;  Surgeon: Cranker Franky, MD;  Location: [facility];  Service: Orthopedics;  Laterality: Right;   TONSILLECTOMY     TOTAL HIP ARTHROPLASTY Right 02/21/2024   Procedure: ARTHROPLASTY, HIP, TOTAL,POSTERIOR APPROACH;  Surgeon: Edie Norleen PARAS, MD;  Location: [facility];  Service: Orthopedics;  Laterality: Right;   TOTAL HIP ARTHROPLASTY Left 07/03/2024   Procedure: ARTHROPLASTY, HIP, TOTAL,POSTERIOR APPROACH;  Surgeon: Edie Norleen PARAS, MD;  Location: [facility];  Service: Orthopedics;  Laterality: Left;   TOTAL KNEE ARTHROPLASTY Bilateral    TOTAL KNEE REVISION Right 05/05/2020   Procedure: right total knee arthroplasty revision;  Surgeon: Leora Lynwood SAUNDERS, MD;  Location: [facility];  Service: Orthopedics;  Laterality: Right;   Patient Active Problem List   Diagnosis Date Noted   Status post total hip replacement, left 07/03/2024   Status post total hip replacement, right 02/21/2024   Right sided weakness 05/15/2022   GERD (gastroesophageal reflux disease) 05/15/2022   Peripheral neuropathy 05/15/2022   Hyperlipidemia associated with type 2 diabetes mellitus (HCC) 05/15/2022   OSA (obstructive sleep apnea) 05/15/2022   Chronic heart failure with preserved ejection fraction (HFpEF) (HCC) 05/15/2022   Asthma, chronic 05/15/2022   Chronic kidney disease, stage 3a (HCC) 05/15/2022   Chest pain 02/05/2021   SOB (shortness of breath) 02/05/2021   Leg swelling 02/05/2021   Nonrheumatic mitral valve regurgitation 02/05/2021   Morbid obesity (HCC) 02/05/2021   S/P revision of total knee, right 05/05/2020   S/P right rotator cuff repair 07/30/2018   S/P left rotator cuff repair 11/23/2016   Essential hypertension 02/05/2003   ONSET DATE: 1.5 months worsening pain in hands.   REFERRING DIAG:  M25.542 (ICD-10-CM) - Pain in joints of left hand  M25.541 (ICD-10-CM) - Pain in joints of right hand  M18.0 (ICD-10-CM) - Bilateral primary osteoarthritis of first carpometacarpal joints  M65.332 (ICD-10-CM) - Trigger finger, left  middle finger  M65.321 (ICD-10-CM) - Trigger finger, right index finger   THERAPY DIAG:  Muscle weakness (generalized)  Other lack of coordination  Trigger finger, right index finger  Primary osteoarthritis of first carpometacarpal joints, bilateral  Joint pain in both hands  Pain in right hand  Trigger finger, left middle finger  Rationale for Evaluation and Treatment: Rehabilitation  SUBJECTIVE:   SUBJECTIVE STATEMENT: Pt apologetic as she arrived to therapy gym at incorrect tx time.  Pt agreeable to shortened tx session.   Pt accompanied by: self  PERTINENT HISTORY: Per EMR from visit with Dr. Ezra on 10/23/24: History of Present Illness Estle Sabella Swarthout is a 69 year old female with carpal tunnel syndrome and diabetes who presents with hand pain and swelling.   She experiences persistent pain and swelling in her hands, particularly in the fingers, for over a month. The pain is constant and accompanied by tingling, more pronounced in the fingers than the thumb.   She has a history of carpal tunnel surgeries on both hands, which provided relief,  but the current symptoms differ, focusing more on finger pain. She also underwent trigger finger surgery on her left middle finger, which previously got stuck and required surgical intervention   She uses topical treatments like Voltaren gel and Taysofy with minimal relief and takes Tylenol  for arthritis, which provides some relief but does not completely alleviate the pain.   The left hand is worse than the right, with the most severe pain in the middle finger. No current locking or popping of the fingers since the trigger finger surgery.   She has diabetes.   No numbness, but reports tingling and constant pain in the fingers. No significant pain in the thumb.   Prior medical records were reviewed.   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 Current symptoms include constant pain in fingers, particularly right index and left middle fingers, with possible trigger finger involvement. X-rays show arthritis at thumb bases and multiple finger joints.  Discussed steroid injections for symptom relief, with risks including infection, allergic reaction, skin discoloration, and elevated blood sugar. Hand therapy may aid in range of motion and pain management. Warm gloves and thumb braces may provide additional support and pain relief. - Administered steroid injection to left hand middle finger and right hand index finger. - Provided thumb braces for support. - Recommended use of Voltaren gel at the base of the thumbs. - Continue with Tylenol /anti-inflammatories - Referral to therapy for range of motion and pain management modalities placed - Follow-up as needed  PRECAUTIONS: None  RED FLAGS: None   WEIGHT BEARING RESTRICTIONS: No  PAIN: 11/28/24: bilat hands 5/10 11/24/24: bilateral hands: 7/10 pain, reduced only slightly to 6/10 end of session 11/11/24: L hand 7-8/10 pain, R 6/10 pain Are you having pain? Yes: NPRS scale: 7-8, occasionally neck pain that can reach 6-7 1 or 2 x per week  Pain location: bilat hands, L tends to be worse than the R Pain description: achy, irritating, pins and needles  Aggravating factors: activity  Relieving factors: heat, rest, Tramadol , B wrist braces with thumb component to stabilize MCPs, wears braces on/off throughout the day but consistently at night time   FALLS: Has patient fallen in last 6 months? No  LIVING ENVIRONMENT: Lives with: lives alone Lives in: ground level apartment  Stairs: No Has following equipment at home: Vannie - 4 wheeled, shower chair, bed side commode, and Grab bars (3in1 is propped over the toilet) Grab bar by toilet and in shower  PLOF: Needs assistance with ADLs, has paid caregiver 7 days a week who helps with bathing, dressing, shopping,  IADLs   PATIENT GOALS: That my hands get better. Decrease pain, be able to pick up things without dropping and breaking them.  NEXT MD VISIT: Follow up with Dr. Ezra as needed.  OBJECTIVE:  Note: Objective measures were completed at Evaluation unless otherwise noted.  HAND DOMINANCE: Right  ADLs: CNA assist 7 days a week Eating: assist to cut food  Grooming: difficulty squeezing denture paste and toothpaste, can be difficult to hold a comb or brush Upper body dressing: difficulty with clothing fasteners  Lower body dressing: difficulty with clothing fasteners Toileting: Occasional assist for thorough peri care  Bathing: CNA assists Meal prep: Difficulty opening water  bottles, new containers and jars   FUNCTIONAL OUTCOME MEASURES: MAM-20 for musculoskeletal conditions: 41/80  UPPER EXTREMITY ROM:     Active ROM Right eval Left eval  Shoulder flexion  Shoulder abduction    Shoulder adduction    Shoulder extension    Shoulder internal rotation    Shoulder external rotation    Elbow flexion    Elbow extension    Wrist flexion 35 76  Wrist extension 49 60  Wrist ulnar deviation 45 31  Wrist radial deviation 21 33  Wrist pronation 90 90  Wrist supination 60 60  (Blank rows = not tested)  Active ROM Right eval Left eval  Thumb MCP (0-60) 40 50  Thumb IP (0-80) 35 40  Thumb Radial abd/add (0-55)     Thumb Palmar abd/add (0-45)     Thumb Opposition to Small Finger WFL WFL   Index MCP (0-90)     Index PIP (0-100)     Index DIP (0-70)      Long MCP (0-90)      Long PIP (0-100)      Long DIP (0-70)      Ring MCP (0-90)      Ring PIP (0-100)      Ring DIP (0-70)      Little MCP (0-90)      Little PIP (0-100)      Little DIP (0-70)      (Blank rows = not tested)  *L hand: 30* radial drift LF DIP joint, passively to neutral  *Pt reports L LF and R IF no longer triggering, but still sore Able to make full composite fist in each hand   UPPER EXTREMITY MMT:   TBD in follow up session   MMT Right eval Left eval  Shoulder flexion    Shoulder abduction    Shoulder adduction    Shoulder extension    Shoulder internal rotation    Shoulder external rotation    Middle trapezius    Lower trapezius    Elbow flexion 4+ 4+  Elbow extension 4 4  Wrist flexion 5 4  Wrist extension 4+ 4+  Wrist ulnar deviation 4+ 4+  Wrist radial deviation 4+ 4+  Wrist pronation 4 4  Wrist supination 4 4  (Blank rows = not tested)      R thumb MP flexion 4-/5, radial abd 4-/5, radial add 4+/5, R thumb IP flexion/ext 4-/5   L thumb MP flexion 4-/5, radial abd 4-/5, radial add 4-/5, L thumb IP flexion/ext  4-/5   HAND FUNCTION: Grip strength: Right: 45 lbs; Left: 39 lbs, Lateral pinch: Right: 14 lbs, Left: 10 lbs, and 3 point pinch: Right: 10 lbs, Left: 8 lbs  COORDINATION: TBD in follow up session 9 Hole Peg test: Right: NT sec; Left: NT sec  SENSATION: Light touch: Impaired tingly in median nerve distribution of R/L hands,   EDEMA: No visible edema  COGNITION: Overall cognitive status: Within functional limits for tasks assessed  TREATMENT DATE: 11/28/24 Moist heat applied to R/L hands for pain reduction/muscle relaxation intermittently throughout ROM and manual therapy noted below.  Manual Therapy: -Soft tissue massage to each joint in bilat thumbs, thenar and hypothenar eminence R/L, working to increase tissue lengthening for flat hand. -STM to IP, MP, CMC thumb joints bilaterally, and 2nd, 3rd, and 4th digit PIP joints bilaterally for increasing circulation/decreasing pain -STM to flexors in the palm d/t tenderness and stiffness, and tenderness at the base of the MCPs (r/t trigger finger)  Therapeutic Exercise: -Performed passive stretching to bilat wrists, including gentle joint distraction at the wrist to passively stretch  into pain free ranges for flex/ext/RD/UD to each hand.   -Performed bilat thumb passive abd (repeat vc for relaxation to reduce guarding), IP flex/ext with joint blocking, and MP flex/ext.   -Performed passive full fisting and digit ext of all digits in each hand, with OT assist to maximize end ranges at each joint, specifically in R/L 2nd digits d/t MCP joint stiffness bilaterally.   PATIENT EDUCATION: Education details: Pain management strategies, HEP review, ice cube massage to volar palms/digits Person educated: Patient Education method: Explanation, Demonstration, Tactile cues, Verbal cues, and Handouts Education comprehension: verbalized understanding, returned demonstration, verbal cues required, tactile cues required, and needs further education  HOME EXERCISE PROGRAM: Green theraputty, wrist flexibility. Median nerve glides  GOALS: Goals reviewed with patient? Yes  SHORT TERM GOALS: Target date: 12/15/24  Pt will be indep to perform HEP for improving bilat hand flexibility, strength, and coordination for daily tasks. Baseline: Eval: Not yet initiated Goal status: INITIAL  2.  Pt will be indep to verbalize 2-3 joint protection/cumulative trauma prevention strategies to reduce pain/paresthesias in the R/L hands.  Baseline: Eval: Educ not yet initiated Goal status: INITIAL  LONG TERM GOALS: Target date: 01/26/25  Pt will increase MAM-20 score for musculoskeletal conditions by 10 or more points to indicate improvement in self perceived functional use of the L hand for daily tasks.   Baseline: Eval: 41/80 Goal status: INITIAL  2.  Pt will tolerate manual therapy, therapeutic modalities, and exercises to decrease pain in B hands to a reported 3/10 pain or less with activity.  Baseline: Eval: 7-8/10 with activity  Goal status: INITIAL  3.  Pt will increase B grip strength by 5 or more lbs to ease ability to open containers.  Baseline: R 45 lbs, L 39 lbs Goal status:  INITIAL  ASSESSMENT:  CLINICAL IMPRESSION: Shortened tx session this date as pt accidentally arrived at incorrect tx time.  Pt continues to respond well to moist heat, but does continue to have significant tenderness with manual therapy within the wrists and hands.  Pt continues to present with joint stiffness, tendon stiffness, and guarding during gentle passive stretching noted above.  Pt reports that she continues to work on her putty and her stretches in home, and has been using heat to manage pain.  Pt receptive to try ice massage to volar palms to target tender areas from trigger finger.  Encouraged 3-4 min each hand.  Pt verbalized understanding.  Pt. continues to benefit from skilled OT to address above noted deficits, focusing on pain management, activity modification, AE training, and development of HEP for improving strength, flexibility, and coordination in bilat hands to increase indep and tolerance to daily tasks.   PERFORMANCE DEFICITS: in functional skills including ADLs, IADLs, coordination, dexterity, sensation, edema, ROM, strength, pain, fascial restrictions, flexibility, Fine motor control, body mechanics, decreased knowledge of precautions, decreased  knowledge of use of DME, skin integrity, and UE functional use, and psychosocial skills including coping strategies, environmental adaptation, habits, and routines and behaviors.   IMPAIRMENTS: are limiting patient from ADLs, IADLs, rest and sleep, leisure, and social participation.   COMORBIDITIES: has co-morbidities such as arthritis, carpal tunnel release, trigger finger release, neuropathy that affects occupational performance. Patient will benefit from skilled OT to address above impairments and improve overall function.  MODIFICATION OR ASSISTANCE TO COMPLETE EVALUATION: Min-Moderate modification of tasks or assist with assess necessary to complete an evaluation.  OT OCCUPATIONAL PROFILE AND HISTORY: Detailed assessment: Review  of records and additional review of physical, cognitive, psychosocial history related to current functional performance.  CLINICAL DECISION MAKING: Moderate - several treatment options, min-mod task modification necessary  REHAB POTENTIAL: Good  EVALUATION COMPLEXITY: High      PLAN:  OT FREQUENCY: 1-2x/week  OT DURATION: 12 weeks  PLANNED INTERVENTIONS: 97168 OT Re-evaluation, 97535 self care/ADL training, 02889 therapeutic exercise, 97530 therapeutic activity, 97112 neuromuscular re-education, 97140 manual therapy, 97035 ultrasound, 97018 paraffin, 02989 moist heat, 97010 cryotherapy, 97034 contrast bath, 97750 Physical Performance Testing, 02239 Orthotic Initial, 97763 Orthotic/Prosthetic subsequent, passive range of motion, psychosocial skills training, energy conservation, coping strategies training, patient/family education, and DME and/or AE instructions  RECOMMENDED OTHER SERVICES: None at this time   CONSULTED AND AGREED WITH PLAN OF CARE: Patient  PLAN FOR NEXT SESSION: see above   Inocente Blazing, MS, OTR/L   11/29/2024, 8:22 PM   "

## 2024-12-01 ENCOUNTER — Ambulatory Visit

## 2024-12-01 DIAGNOSIS — M6281 Muscle weakness (generalized): Secondary | ICD-10-CM

## 2024-12-01 DIAGNOSIS — M65332 Trigger finger, left middle finger: Secondary | ICD-10-CM

## 2024-12-01 DIAGNOSIS — R278 Other lack of coordination: Secondary | ICD-10-CM

## 2024-12-01 DIAGNOSIS — M25541 Pain in joints of right hand: Secondary | ICD-10-CM

## 2024-12-01 DIAGNOSIS — M65321 Trigger finger, right index finger: Secondary | ICD-10-CM

## 2024-12-01 DIAGNOSIS — M180 Bilateral primary osteoarthritis of first carpometacarpal joints: Secondary | ICD-10-CM

## 2024-12-01 NOTE — Therapy (Signed)
 " OUTPATIENT OCCUPATIONAL THERAPY ORTHO TREATMENT NOTE  Patient Name: Kristine Rangel MRN: [id number] DOB:1956-02-01, 69 y.o., female Today's Date: 12/01/2024  PCP: Dr. Rock Pounds REFERRING PROVIDER: Dr. Jackquline Barrack (orthopedist)  END OF SESSION:  OT End of Session - 12/01/24 1305     Visit Number 6    Number of Visits 24    Date for Recertification  01/26/25    Progress Note Due on Visit 10    OT Start Time 0934    OT Stop Time 1015    OT Time Calculation (min) 41 min    Activity Tolerance Patient tolerated treatment well    Behavior During Therapy [facility] for tasks assessed/performed         Past Medical History:  Diagnosis Date   (HFpEF) heart failure with preserved ejection fraction (HCC)    a.) TTE 03/03/2021: EF 60-65%, no RWMAs, mild LA dil, RVSP 32.6, G2DD; b.) TTE 05/17/2022: EF 60-65%, no RWMAs, normal RVSF, G1DD   Anemia    when she was younger   Anxiety    Arthritis    Asthma    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   Bladder leak    CAD (coronary artery disease) 09/11/2011   a.) LHC 09/11/2011: minor luminal irregulaties mLAD, mLCx, mRCA - med mgmt.   Cardiac murmur    Chest pain    a.) 10/07/2008 Neg [place] Echo; b.) LHC 09/11/2011 - minor lum irregs; no sig CAD; c.) MV 06/02/2015: no ischemia; d.) cCTA 02/2021: Ca2+ = 0. Normal coronaries   CKD (chronic kidney disease), stage III (HCC)    COPD (chronic obstructive pulmonary disease) (HCC)    Depression    Diet-controlled type 2 diabetes mellitus (HCC)    Dyspnea    Essential hypertension    GERD (gastroesophageal reflux disease)    Glaucoma    History of kidney stones    Hyperlipidemia    Insomnia    a.) takes suvorexant    MI (myocardial infarction) (HCC)    a.) in the 1990s; details  unclear   Migraine with aura    Neuropathy    On apixaban  therapy    OSA on CPAP    Primary osteoarthritis of left hip    Rotator cuff injury    Seasonal allergies    Tendinitis of right rotator cuff    TIA (transient ischemic attack)    a. 05/2022 CTA head/neck: No significant stenosis; b. 05/2022 MRI Brain: No acute abnormality. Mild chronic small vessel ischemia.   Past Surgical History:  Procedure Laterality Date   BRAIN SURGERY Right 2010   anuerysm repair   CARPAL TUNNEL RELEASE Right 06/12/2017   Procedure: CARPAL TUNNEL RELEASE;  Surgeon: Marchia Drivers, MD;  Location: [facility];  Service: Orthopedics;  Laterality: Right;   CHOLECYSTECTOMY     COLONOSCOPY WITH PROPOFOL  N/A 08/09/2021   Procedure: COLONOSCOPY WITH PROPOFOL ;  Surgeon: Maryruth Ole DASEN, MD;  Location: [facility];  Service: Endoscopy;  Laterality: N/A;   ESOPHAGOGASTRODUODENOSCOPY (EGD) WITH PROPOFOL  N/A 08/09/2021   Procedure: ESOPHAGOGASTRODUODENOSCOPY (EGD) WITH PROPOFOL ;  Surgeon: Maryruth Ole DASEN, MD;  Location: [facility];  Service: Endoscopy;  Laterality: N/A;   LEFT HEART CATH AND CORONARY ANGIOGRAPHY Left 09/11/2011   Procedure: LEFT HEART CATH AND CORONARY ANGIOGRAPHY; Location: [facility]; Surgeon: Denyse Bathe, MD  REVERSE SHOULDER ARTHROPLASTY Right 08/02/2023   Procedure: REVERSE SHOULDER ARTHROPLASTY;  Surgeon: Edie Norleen PARAS, MD;  Location: [facility];  Service: Orthopedics;  Laterality: Right;   SHOULDER ARTHROSCOPY WITH OPEN ROTATOR CUFF REPAIR Left 11/23/2016   Procedure: SHOULDER ARTHROSCOPY WITH OPEN ROTATOR CUFF REPAIR, ARTHROSCOPIC SUBACROMIAL DECOMPRESSION, DISTAL CLAVICLE EXCISION;  Surgeon: Franky Cranker, MD;  Location: [facility];  Service: Orthopedics;  Laterality: Left;   SHOULDER ARTHROSCOPY WITH OPEN ROTATOR CUFF REPAIR Right 07/30/2018   Procedure: SHOULDER ARTHROSCOPY WITH OPEN ROTATOR CUFF REPAIR,SUBACROMINAL DECOMPRESSION AND DISTAL CLAVICLE EXCISION;  Surgeon: Cranker Franky, MD;  Location: [facility];  Service: Orthopedics;  Laterality: Right;   TONSILLECTOMY     TOTAL HIP ARTHROPLASTY Right 02/21/2024   Procedure: ARTHROPLASTY, HIP, TOTAL,POSTERIOR APPROACH;  Surgeon: Edie Norleen PARAS, MD;  Location: [facility];  Service: Orthopedics;  Laterality: Right;   TOTAL HIP ARTHROPLASTY Left 07/03/2024   Procedure: ARTHROPLASTY, HIP, TOTAL,POSTERIOR APPROACH;  Surgeon: Edie Norleen PARAS, MD;  Location: [facility];  Service: Orthopedics;  Laterality: Left;   TOTAL KNEE ARTHROPLASTY Bilateral    TOTAL KNEE REVISION Right 05/05/2020   Procedure: right total knee arthroplasty revision;  Surgeon: Leora Lynwood SAUNDERS, MD;  Location: [facility];  Service: Orthopedics;  Laterality: Right;   Patient Active Problem List   Diagnosis Date Noted   Status post total hip replacement, left 07/03/2024   Status post total hip replacement, right 02/21/2024   Right sided weakness 05/15/2022   GERD (gastroesophageal reflux disease) 05/15/2022   Peripheral neuropathy 05/15/2022   Hyperlipidemia associated with type 2 diabetes mellitus (HCC) 05/15/2022   OSA (obstructive sleep apnea) 05/15/2022   Chronic heart failure with preserved ejection fraction (HFpEF) (HCC) 05/15/2022   Asthma, chronic 05/15/2022   Chronic kidney disease, stage 3a (HCC) 05/15/2022   Chest pain 02/05/2021   SOB (shortness of breath) 02/05/2021   Leg swelling 02/05/2021   Nonrheumatic mitral valve regurgitation 02/05/2021   Morbid obesity (HCC) 02/05/2021   S/P revision of total knee, right 05/05/2020   S/P right rotator cuff repair 07/30/2018   S/P left rotator cuff repair 11/23/2016   Essential hypertension 02/05/2003   ONSET DATE: 1.5 months worsening pain in hands.   REFERRING DIAG:  M25.542 (ICD-10-CM) - Pain in joints of left hand  M25.541 (ICD-10-CM) - Pain in joints of right hand  M18.0 (ICD-10-CM) - Bilateral primary osteoarthritis of first carpometacarpal joints  M65.332 (ICD-10-CM) - Trigger finger, left  middle finger  M65.321 (ICD-10-CM) - Trigger finger, right index finger   THERAPY DIAG:  Muscle weakness (generalized)  Other lack of coordination  Trigger finger, right index finger  Primary osteoarthritis of first carpometacarpal joints, bilateral  Joint pain in both hands  Trigger finger, left middle finger  Rationale for Evaluation and Treatment: Rehabilitation  SUBJECTIVE:   SUBJECTIVE STATEMENT: Pt reports having a difficult weekend d/t high pain levels.  Pt states that she started Tramadol  which does seem to help pain for awhile, and she reports she can take this every 6 hours.   Pt accompanied by: self  PERTINENT HISTORY: Per EMR from visit with Dr. Ezra on 10/23/24: History of Present Illness Johniece Hornbaker Janco is a 69 year old female with carpal tunnel syndrome and diabetes who presents with hand pain and swelling.   She experiences persistent pain and swelling in her hands, particularly in the fingers, for over a month. The pain is constant and accompanied by tingling, more pronounced in the fingers than the thumb.   She has a  history of carpal tunnel surgeries on both hands, which provided relief, but the current symptoms differ, focusing more on finger pain. She also underwent trigger finger surgery on her left middle finger, which previously got stuck and required surgical intervention   She uses topical treatments like Voltaren gel and Taysofy with minimal relief and takes Tylenol  for arthritis, which provides some relief but does not completely alleviate the pain.   The left hand is worse than the right, with the most severe pain in the middle finger. No current locking or popping of the fingers since the trigger finger surgery.   She has diabetes.   No numbness, but reports tingling and constant pain in the fingers. No significant pain in the thumb.   Prior medical records were reviewed.   Assessment & Plan Hand osteoarthritis with bilateral trigger  fingers and thumb carpometacarpal arthritis Chronic hand pain primarily affecting fingers. Current symptoms include constant pain in fingers, particularly right index and left middle fingers, with possible trigger finger involvement. X-rays show arthritis at thumb bases and multiple finger joints.  Discussed steroid injections for symptom relief, with risks including infection, allergic reaction, skin discoloration, and elevated blood sugar. Hand therapy may aid in range of motion and pain management. Warm gloves and thumb braces may provide additional support and pain relief. - Administered steroid injection to left hand middle finger and right hand index finger. - Provided thumb braces for support. - Recommended use of Voltaren gel at the base of the thumbs. - Continue with Tylenol /anti-inflammatories - Referral to therapy for range of motion and pain management modalities placed - Follow-up as needed  PRECAUTIONS: None  RED FLAGS: None   WEIGHT BEARING RESTRICTIONS: No  PAIN: 12/01/24: R hand 4/10, L hand 4.5/10 pain  11/24/24: bilateral hands: 7/10 pain, reduced only slightly to 6/10 end of session 11/11/24: L hand 7-8/10 pain, R 6/10 pain Are you having pain? Yes: NPRS scale: 7-8, occasionally neck pain that can reach 6-7 1 or 2 x per week  Pain location: bilat hands, L tends to be worse than the R Pain description: achy, irritating, pins and needles  Aggravating factors: activity  Relieving factors: heat, rest, Tramadol , B wrist braces with thumb component to stabilize MCPs, wears braces on/off throughout the day but consistently at night time   FALLS: Has patient fallen in last 6 months? No  LIVING ENVIRONMENT: Lives with: lives alone Lives in: ground level apartment  Stairs: No Has following equipment at home: Vannie - 4 wheeled, shower chair, bed side commode, and Grab bars (3in1 is propped over the toilet) Grab bar by toilet and in shower  PLOF: Needs assistance with ADLs,  has paid caregiver 7 days a week who helps with bathing, dressing, shopping, IADLs   PATIENT GOALS: That my hands get better. Decrease pain, be able to pick up things without dropping and breaking them.  NEXT MD VISIT: Follow up with Dr. Ezra as needed.  OBJECTIVE:  Note: Objective measures were completed at Evaluation unless otherwise noted.  HAND DOMINANCE: Right  ADLs: CNA assist 7 days a week Eating: assist to cut food  Grooming: difficulty squeezing denture paste and toothpaste, can be difficult to hold a comb or brush Upper body dressing: difficulty with clothing fasteners  Lower body dressing: difficulty with clothing fasteners Toileting: Occasional assist for thorough peri care  Bathing: CNA assists Meal prep: Difficulty opening water  bottles, new containers and jars   FUNCTIONAL OUTCOME MEASURES: MAM-20 for musculoskeletal conditions: 41/80  UPPER  EXTREMITY ROM:     Active ROM Right eval Left eval  Shoulder flexion    Shoulder abduction    Shoulder adduction    Shoulder extension    Shoulder internal rotation    Shoulder external rotation    Elbow flexion    Elbow extension    Wrist flexion 35 76  Wrist extension 49 60  Wrist ulnar deviation 45 31  Wrist radial deviation 21 33  Wrist pronation 90 90  Wrist supination 60 60  (Blank rows = not tested)  Active ROM Right eval Left eval  Thumb MCP (0-60) 40 50  Thumb IP (0-80) 35 40  Thumb Radial abd/add (0-55)     Thumb Palmar abd/add (0-45)     Thumb Opposition to Small Finger WFL WFL   Index MCP (0-90)     Index PIP (0-100)     Index DIP (0-70)      Long MCP (0-90)      Long PIP (0-100)      Long DIP (0-70)      Ring MCP (0-90)      Ring PIP (0-100)      Ring DIP (0-70)      Little MCP (0-90)      Little PIP (0-100)      Little DIP (0-70)      (Blank rows = not tested)  *L hand: 30* radial drift LF DIP joint, passively to neutral  *Pt reports L LF and R IF no longer triggering, but still  sore Able to make full composite fist in each hand   UPPER EXTREMITY MMT:  TBD in follow up session   MMT Right eval Left eval  Shoulder flexion    Shoulder abduction    Shoulder adduction    Shoulder extension    Shoulder internal rotation    Shoulder external rotation    Middle trapezius    Lower trapezius    Elbow flexion 4+ 4+  Elbow extension 4 4  Wrist flexion 5 4  Wrist extension 4+ 4+  Wrist ulnar deviation 4+ 4+  Wrist radial deviation 4+ 4+  Wrist pronation 4 4  Wrist supination 4 4  (Blank rows = not tested)      R thumb MP flexion 4-/5, radial abd 4-/5, radial add 4+/5, R thumb IP flexion/ext 4-/5   L thumb MP flexion 4-/5, radial abd 4-/5, radial add 4-/5, L thumb IP flexion/ext  4-/5  HAND FUNCTION: Grip strength: Right: 45 lbs; Left: 39 lbs, Lateral pinch: Right: 14 lbs, Left: 10 lbs, and 3 point pinch: Right: 10 lbs, Left: 8 lbs  COORDINATION: TBD in follow up session 9 Hole Peg test: Right: NT sec; Left: NT sec  SENSATION: Light touch: Impaired tingly in median nerve distribution of R/L hands,   EDEMA: No visible edema  COGNITION: Overall cognitive status: Within functional limits for tasks assessed  TREATMENT DATE: 12/01/24 Moist heat applied to R/L hands for pain reduction/muscle relaxation following constrast bath and simultaneous to education noted below.  Contrast Bath: 15 min Alternating 3 min moist heat then 1 min cold pack x3 rounds, beginning and ending with 3 min moist heat to B wrist/hand, working to increase circulation/decrease pain/decrease swelling in the wrists and hands.  Self Care: -Condition management education: -Reviewed joint protection strategies for the hands with issue of written handout; reinforcing benefits of alternating activity and rest, allowing larger muscles to take the lead, avoiding high  repetition, and using AE/activity modification as needed -Reviewed [facility] OA dx with issue of visual handout to understand anatomy and reason for joint pain -Reviewed trigger finger dx with issue of visual handout to understand anatomy and cause of trigger finger -Reviewed recommended grasp patterns when holding a cup; encouraged grasping cup with relaxed hand beneath a large handled cup, use of 2 hands, avoid tight grip  -Reinforced benefits of ice cube massage for pain management/edema reduction within palms; recommended 3-4 min to tender areas  -Issued built up foam grips for eating utensils, grooming utensils, and pen/pencil to reduce hand strain with ADLs.    PATIENT EDUCATION: Education details: Condition management education  Person educated: Patient Education method: Explanation, Verbal cues, and Handouts Education comprehension: verbalized understanding, verbal cues required, and needs further education  HOME EXERCISE PROGRAM: Green theraputty, wrist flexibility. Median nerve glides  GOALS: Goals reviewed with patient? Yes  SHORT TERM GOALS: Target date: 12/15/24  Pt will be indep to perform HEP for improving bilat hand flexibility, strength, and coordination for daily tasks. Baseline: Eval: Not yet initiated Goal status: INITIAL  2.  Pt will be indep to verbalize 2-3 joint protection/cumulative trauma prevention strategies to reduce pain/paresthesias in the R/L hands.  Baseline: Eval: Educ not yet initiated Goal status: INITIAL  LONG TERM GOALS: Target date: 01/26/25  Pt will increase MAM-20 score for musculoskeletal conditions by 10 or more points to indicate improvement in self perceived functional use of the L hand for daily tasks.   Baseline: Eval: 41/80 Goal status: INITIAL  2.  Pt will tolerate manual therapy, therapeutic modalities, and exercises to decrease pain in B hands to a reported 3/10 pain or less with activity.  Baseline: Eval: 7-8/10 with activity  Goal  status: INITIAL  3.  Pt will increase B grip strength by 5 or more lbs to ease ability to open containers.  Baseline: R 45 lbs, L 39 lbs Goal status: INITIAL  ASSESSMENT:  CLINICAL IMPRESSION: Pt reports starting Tramadol  over the weekend d/t consistently high pain levels.  Pt reports Tramadol  does help for awhile.  Good tolerance to contrast bath today; issued written handout for carryover at home as another form of conservative pain management and edema reduction.  Pt receptive to all education today related to OA, trigger finger, and general joint protection strategies for the hands.  Pt encouraged to review handouts further at home and return with questions as needed.  Pt receptive to try built up foam grips over utensils at home, and did report feeling improved comfort and stability with her grip when grasping utensils in tx today.  Pt. continues to benefit from skilled OT to address above noted deficits, focusing on pain management, activity modification, AE training, and development of HEP for improving strength, flexibility, and coordination in bilat hands to increase indep and tolerance to daily tasks.   PERFORMANCE DEFICITS: in functional skills including ADLs, IADLs, coordination, dexterity, sensation, edema,  ROM, strength, pain, fascial restrictions, flexibility, Fine motor control, body mechanics, decreased knowledge of precautions, decreased knowledge of use of DME, skin integrity, and UE functional use, and psychosocial skills including coping strategies, environmental adaptation, habits, and routines and behaviors.   IMPAIRMENTS: are limiting patient from ADLs, IADLs, rest and sleep, leisure, and social participation.   COMORBIDITIES: has co-morbidities such as arthritis, carpal tunnel release, trigger finger release, neuropathy that affects occupational performance. Patient will benefit from skilled OT to address above impairments and improve overall function.  MODIFICATION OR  ASSISTANCE TO COMPLETE EVALUATION: Min-Moderate modification of tasks or assist with assess necessary to complete an evaluation.  OT OCCUPATIONAL PROFILE AND HISTORY: Detailed assessment: Review of records and additional review of physical, cognitive, psychosocial history related to current functional performance.  CLINICAL DECISION MAKING: Moderate - several treatment options, min-mod task modification necessary  REHAB POTENTIAL: Good  EVALUATION COMPLEXITY: High      PLAN:  OT FREQUENCY: 1-2x/week  OT DURATION: 12 weeks  PLANNED INTERVENTIONS: 97168 OT Re-evaluation, 97535 self care/ADL training, 02889 therapeutic exercise, 97530 therapeutic activity, 97112 neuromuscular re-education, 97140 manual therapy, 97035 ultrasound, 97018 paraffin, 02989 moist heat, 97010 cryotherapy, 97034 contrast bath, 97750 Physical Performance Testing, 02239 Orthotic Initial, 97763 Orthotic/Prosthetic subsequent, passive range of motion, psychosocial skills training, energy conservation, coping strategies training, patient/family education, and DME and/or AE instructions  RECOMMENDED OTHER SERVICES: None at this time   CONSULTED AND AGREED WITH PLAN OF CARE: Patient  PLAN FOR NEXT SESSION: see above   Inocente Blazing, MS, OTR/L   12/01/2024, 1:07 PM   "

## 2024-12-03 ENCOUNTER — Ambulatory Visit

## 2024-12-03 DIAGNOSIS — R278 Other lack of coordination: Secondary | ICD-10-CM

## 2024-12-03 DIAGNOSIS — M6281 Muscle weakness (generalized): Secondary | ICD-10-CM

## 2024-12-03 DIAGNOSIS — M65332 Trigger finger, left middle finger: Secondary | ICD-10-CM

## 2024-12-03 DIAGNOSIS — M65321 Trigger finger, right index finger: Secondary | ICD-10-CM

## 2024-12-03 DIAGNOSIS — M25541 Pain in joints of right hand: Secondary | ICD-10-CM

## 2024-12-03 DIAGNOSIS — M180 Bilateral primary osteoarthritis of first carpometacarpal joints: Secondary | ICD-10-CM

## 2024-12-03 NOTE — Therapy (Unsigned)
 " OUTPATIENT OCCUPATIONAL THERAPY ORTHO TREATMENT NOTE  Patient Name: Kristine Rangel MRN: [id number] DOB:07-Apr-1956, 69 y.o., female Today's Date: 12/03/2024  PCP: Dr. Rock Pounds REFERRING PROVIDER: Dr. Jackquline Barrack (orthopedist)  END OF SESSION:  OT End of Session - 12/03/24 0938     Visit Number 7    Number of Visits 24    Date for Recertification  01/26/25    Progress Note Due on Visit 10    OT Start Time 0930    OT Stop Time 1015    OT Time Calculation (min) 45 min    Activity Tolerance Patient tolerated treatment well    Behavior During Therapy [facility] for tasks assessed/performed         Past Medical History:  Diagnosis Date   (HFpEF) heart failure with preserved ejection fraction (HCC)    a.) TTE 03/03/2021: EF 60-65%, no RWMAs, mild LA dil, RVSP 32.6, G2DD; b.) TTE 05/17/2022: EF 60-65%, no RWMAs, normal RVSF, G1DD   Anemia    when she was younger   Anxiety    Arthritis    Asthma    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   Bladder leak    CAD (coronary artery disease) 09/11/2011   a.) LHC 09/11/2011: minor luminal irregulaties mLAD, mLCx, mRCA - med mgmt.   Cardiac murmur    Chest pain    a.) 10/07/2008 Neg [place] Echo; b.) LHC 09/11/2011 - minor lum irregs; no sig CAD; c.) MV 06/02/2015: no ischemia; d.) cCTA 02/2021: Ca2+ = 0. Normal coronaries   CKD (chronic kidney disease), stage III (HCC)    COPD (chronic obstructive pulmonary disease) (HCC)    Depression    Diet-controlled type 2 diabetes mellitus (HCC)    Dyspnea    Essential hypertension    GERD (gastroesophageal reflux disease)    Glaucoma    History of kidney stones    Hyperlipidemia    Insomnia    a.) takes suvorexant    MI (myocardial infarction) (HCC)    a.) in the 1990s; details  unclear   Migraine with aura    Neuropathy    On apixaban  therapy    OSA on CPAP    Primary osteoarthritis of left hip    Rotator cuff injury    Seasonal allergies    Tendinitis of right rotator cuff    TIA (transient ischemic attack)    a. 05/2022 CTA head/neck: No significant stenosis; b. 05/2022 MRI Brain: No acute abnormality. Mild chronic small vessel ischemia.   Past Surgical History:  Procedure Laterality Date   BRAIN SURGERY Right 2010   anuerysm repair   CARPAL TUNNEL RELEASE Right 06/12/2017   Procedure: CARPAL TUNNEL RELEASE;  Surgeon: Marchia Drivers, MD;  Location: [facility];  Service: Orthopedics;  Laterality: Right;   CHOLECYSTECTOMY     COLONOSCOPY WITH PROPOFOL  N/A 08/09/2021   Procedure: COLONOSCOPY WITH PROPOFOL ;  Surgeon: Maryruth Ole DASEN, MD;  Location: [facility];  Service: Endoscopy;  Laterality: N/A;   ESOPHAGOGASTRODUODENOSCOPY (EGD) WITH PROPOFOL  N/A 08/09/2021   Procedure: ESOPHAGOGASTRODUODENOSCOPY (EGD) WITH PROPOFOL ;  Surgeon: Maryruth Ole DASEN, MD;  Location: [facility];  Service: Endoscopy;  Laterality: N/A;   LEFT HEART CATH AND CORONARY ANGIOGRAPHY Left 09/11/2011   Procedure: LEFT HEART CATH AND CORONARY ANGIOGRAPHY; Location: [facility]; Surgeon: Denyse Bathe, MD  REVERSE SHOULDER ARTHROPLASTY Right 08/02/2023   Procedure: REVERSE SHOULDER ARTHROPLASTY;  Surgeon: Edie Norleen PARAS, MD;  Location: [facility];  Service: Orthopedics;  Laterality: Right;   SHOULDER ARTHROSCOPY WITH OPEN ROTATOR CUFF REPAIR Left 11/23/2016   Procedure: SHOULDER ARTHROSCOPY WITH OPEN ROTATOR CUFF REPAIR, ARTHROSCOPIC SUBACROMIAL DECOMPRESSION, DISTAL CLAVICLE EXCISION;  Surgeon: Franky Cranker, MD;  Location: [facility];  Service: Orthopedics;  Laterality: Left;   SHOULDER ARTHROSCOPY WITH OPEN ROTATOR CUFF REPAIR Right 07/30/2018   Procedure: SHOULDER ARTHROSCOPY WITH OPEN ROTATOR CUFF REPAIR,SUBACROMINAL DECOMPRESSION AND DISTAL CLAVICLE EXCISION;  Surgeon: Cranker Franky, MD;  Location: [facility];  Service: Orthopedics;  Laterality: Right;   TONSILLECTOMY     TOTAL HIP ARTHROPLASTY Right 02/21/2024   Procedure: ARTHROPLASTY, HIP, TOTAL,POSTERIOR APPROACH;  Surgeon: Edie Norleen PARAS, MD;  Location: [facility];  Service: Orthopedics;  Laterality: Right;   TOTAL HIP ARTHROPLASTY Left 07/03/2024   Procedure: ARTHROPLASTY, HIP, TOTAL,POSTERIOR APPROACH;  Surgeon: Edie Norleen PARAS, MD;  Location: [facility];  Service: Orthopedics;  Laterality: Left;   TOTAL KNEE ARTHROPLASTY Bilateral    TOTAL KNEE REVISION Right 05/05/2020   Procedure: right total knee arthroplasty revision;  Surgeon: Leora Lynwood SAUNDERS, MD;  Location: [facility];  Service: Orthopedics;  Laterality: Right;   Patient Active Problem List   Diagnosis Date Noted   Status post total hip replacement, left 07/03/2024   Status post total hip replacement, right 02/21/2024   Right sided weakness 05/15/2022   GERD (gastroesophageal reflux disease) 05/15/2022   Peripheral neuropathy 05/15/2022   Hyperlipidemia associated with type 2 diabetes mellitus (HCC) 05/15/2022   OSA (obstructive sleep apnea) 05/15/2022   Chronic heart failure with preserved ejection fraction (HFpEF) (HCC) 05/15/2022   Asthma, chronic 05/15/2022   Chronic kidney disease, stage 3a (HCC) 05/15/2022   Chest pain 02/05/2021   SOB (shortness of breath) 02/05/2021   Leg swelling 02/05/2021   Nonrheumatic mitral valve regurgitation 02/05/2021   Morbid obesity (HCC) 02/05/2021   S/P revision of total knee, right 05/05/2020   S/P right rotator cuff repair 07/30/2018   S/P left rotator cuff repair 11/23/2016   Essential hypertension 02/05/2003   ONSET DATE: 1.5 months worsening pain in hands.   REFERRING DIAG:  M25.542 (ICD-10-CM) - Pain in joints of left hand  M25.541 (ICD-10-CM) - Pain in joints of right hand  M18.0 (ICD-10-CM) - Bilateral primary osteoarthritis of first carpometacarpal joints  M65.332 (ICD-10-CM) - Trigger finger, left  middle finger  M65.321 (ICD-10-CM) - Trigger finger, right index finger   THERAPY DIAG:  Muscle weakness (generalized)  Other lack of coordination  Trigger finger, right index finger  Primary osteoarthritis of first carpometacarpal joints, bilateral  Joint pain in both hands  Trigger finger, left middle finger  Rationale for Evaluation and Treatment: Rehabilitation  SUBJECTIVE:   SUBJECTIVE STATEMENT: Pt reports having tried a contrast bath at home and felt like her pain quieted down on the R hand, but not the L.   Pt accompanied by: self  PERTINENT HISTORY: Per EMR from visit with Dr. Ezra on 10/23/24: History of Present Illness Caidynce Muzyka Madia is a 69 year old female with carpal tunnel syndrome and diabetes who presents with hand pain and swelling.   She experiences persistent pain and swelling in her hands, particularly in the fingers, for over a month. The pain is constant and accompanied by tingling, more pronounced in the fingers than the thumb.   She has a history of carpal tunnel surgeries on both hands, which provided relief,  but the current symptoms differ, focusing more on finger pain. She also underwent trigger finger surgery on her left middle finger, which previously got stuck and required surgical intervention   She uses topical treatments like Voltaren gel and Taysofy with minimal relief and takes Tylenol  for arthritis, which provides some relief but does not completely alleviate the pain.   The left hand is worse than the right, with the most severe pain in the middle finger. No current locking or popping of the fingers since the trigger finger surgery.   She has diabetes.   No numbness, but reports tingling and constant pain in the fingers. No significant pain in the thumb.   Prior medical records were reviewed.   Assessment & Plan Hand osteoarthritis with bilateral trigger fingers and thumb carpometacarpal arthritis Chronic hand pain primarily  affecting fingers. Current symptoms include constant pain in fingers, particularly right index and left middle fingers, with possible trigger finger involvement. X-rays show arthritis at thumb bases and multiple finger joints.  Discussed steroid injections for symptom relief, with risks including infection, allergic reaction, skin discoloration, and elevated blood sugar. Hand therapy may aid in range of motion and pain management. Warm gloves and thumb braces may provide additional support and pain relief. - Administered steroid injection to left hand middle finger and right hand index finger. - Provided thumb braces for support. - Recommended use of Voltaren gel at the base of the thumbs. - Continue with Tylenol /anti-inflammatories - Referral to therapy for range of motion and pain management modalities placed - Follow-up as needed  PRECAUTIONS: None  RED FLAGS: None   WEIGHT BEARING RESTRICTIONS: No  PAIN: 12/03/24: R/L hand pain 5/10  11/11/24: L hand 7-8/10 pain, R 6/10 pain Are you having pain? Yes: NPRS scale: 7-8, occasionally neck pain that can reach 6-7 1 or 2 x per week  Pain location: bilat hands, L tends to be worse than the R Pain description: achy, irritating, pins and needles  Aggravating factors: activity  Relieving factors: heat, rest, Tramadol , B wrist braces with thumb component to stabilize MCPs, wears braces on/off throughout the day but consistently at night time   FALLS: Has patient fallen in last 6 months? No  LIVING ENVIRONMENT: Lives with: lives alone Lives in: ground level apartment  Stairs: No Has following equipment at home: Vannie - 4 wheeled, shower chair, bed side commode, and Grab bars (3in1 is propped over the toilet) Grab bar by toilet and in shower  PLOF: Needs assistance with ADLs, has paid caregiver 7 days a week who helps with bathing, dressing, shopping, IADLs   PATIENT GOALS: That my hands get better. Decrease pain, be able to pick up things  without dropping and breaking them.  NEXT MD VISIT: Follow up with Dr. Ezra as needed.  OBJECTIVE:  Note: Objective measures were completed at Evaluation unless otherwise noted.  HAND DOMINANCE: Right  ADLs: CNA assist 7 days a week Eating: assist to cut food  Grooming: difficulty squeezing denture paste and toothpaste, can be difficult to hold a comb or brush Upper body dressing: difficulty with clothing fasteners  Lower body dressing: difficulty with clothing fasteners Toileting: Occasional assist for thorough peri care  Bathing: CNA assists Meal prep: Difficulty opening water  bottles, new containers and jars   FUNCTIONAL OUTCOME MEASURES: MAM-20 for musculoskeletal conditions: 41/80  UPPER EXTREMITY ROM:     Active ROM Right eval Left eval  Shoulder flexion    Shoulder abduction    Shoulder adduction  Shoulder extension    Shoulder internal rotation    Shoulder external rotation    Elbow flexion    Elbow extension    Wrist flexion 35 76  Wrist extension 49 60  Wrist ulnar deviation 45 31  Wrist radial deviation 21 33  Wrist pronation 90 90  Wrist supination 60 60  (Blank rows = not tested)  Active ROM Right eval Left eval  Thumb MCP (0-60) 40 50  Thumb IP (0-80) 35 40  Thumb Radial abd/add (0-55)     Thumb Palmar abd/add (0-45)     Thumb Opposition to Small Finger WFL WFL   Index MCP (0-90)     Index PIP (0-100)     Index DIP (0-70)      Long MCP (0-90)      Long PIP (0-100)      Long DIP (0-70)      Ring MCP (0-90)      Ring PIP (0-100)      Ring DIP (0-70)      Little MCP (0-90)      Little PIP (0-100)      Little DIP (0-70)      (Blank rows = not tested)  *L hand: 30* radial drift LF DIP joint, passively to neutral  *Pt reports L LF and R IF no longer triggering, but still sore Able to make full composite fist in each hand   UPPER EXTREMITY MMT:  TBD in follow up session   MMT Right eval Left eval  Shoulder flexion    Shoulder  abduction    Shoulder adduction    Shoulder extension    Shoulder internal rotation    Shoulder external rotation    Middle trapezius    Lower trapezius    Elbow flexion 4+ 4+  Elbow extension 4 4  Wrist flexion 5 4  Wrist extension 4+ 4+  Wrist ulnar deviation 4+ 4+  Wrist radial deviation 4+ 4+  Wrist pronation 4 4  Wrist supination 4 4  (Blank rows = not tested)      R thumb MP flexion 4-/5, radial abd 4-/5, radial add 4+/5, R thumb IP flexion/ext 4-/5   L thumb MP flexion 4-/5, radial abd 4-/5, radial add 4-/5, L thumb IP flexion/ext  4-/5  HAND FUNCTION: Grip strength: Right: 45 lbs; Left: 39 lbs, Lateral pinch: Right: 14 lbs, Left: 10 lbs, and 3 point pinch: Right: 10 lbs, Left: 8 lbs  COORDINATION: TBD in follow up session 9 Hole Peg test: Right: NT sec; Left: NT sec  SENSATION: Light touch: Impaired tingly in median nerve distribution of R/L hands,   EDEMA: No visible edema  COGNITION: Overall cognitive status: Within functional limits for tasks assessed                                                                                                                            TREATMENT DATE: 12/03/24 Paraffin:  X10 min for R/L  hand pain management/muscle relaxation in prep for therapeutic exercises.  Manual Therapy: -Ice massage to R/L volar palms/wrists x4 min each, targeting areas noted below for reducing pain/inflammation  -STM to volar wrist and hand, using Stickon bar for deeper tissue massage, short strokes, specifically to thenar and hypothenar eminence, carpal tunnel/volar wrist, base of L hand 3rd digit MCP (trigger finger tenderness) and R 2nd digit MCP (trigger finger tenderness) on volar palm, and long strokes up the flexors in the forearm, working to reduce fascial restrictions, increase tissue extensibility, and increase circulation.   Therapeutic Exercise: -Passive wrist flex/ext/RD/UD with gentle joint distraction, passive MP ext of all digits, digit  abd of all digits, and isolated thumb abd, distal medial nerve glide with 20 sec hold; passive stretching to maximize end range without substitution.  PATIENT EDUCATION: Education details: HEP, conservative pain management strategies  Person educated: Patient Education method: Explanation and Verbal cues Education comprehension: verbalized understanding  HOME EXERCISE PROGRAM: Green theraputty, wrist flexibility. Median nerve glides  GOALS: Goals reviewed with patient? Yes  SHORT TERM GOALS: Target date: 12/15/24  Pt will be indep to perform HEP for improving bilat hand flexibility, strength, and coordination for daily tasks. Baseline: Eval: Not yet initiated Goal status: INITIAL  2.  Pt will be indep to verbalize 2-3 joint protection/cumulative trauma prevention strategies to reduce pain/paresthesias in the R/L hands.  Baseline: Eval: Educ not yet initiated Goal status: INITIAL  LONG TERM GOALS: Target date: 01/26/25  Pt will increase MAM-20 score for musculoskeletal conditions by 10 or more points to indicate improvement in self perceived functional use of the L hand for daily tasks.   Baseline: Eval: 41/80 Goal status: INITIAL  2.  Pt will tolerate manual therapy, therapeutic modalities, and exercises to decrease pain in B hands to a reported 3/10 pain or less with activity.  Baseline: Eval: 7-8/10 with activity  Goal status: INITIAL  3.  Pt will increase B grip strength by 5 or more lbs to ease ability to open containers.  Baseline: R 45 lbs, L 39 lbs Goal status: INITIAL  ASSESSMENT:  CLINICAL IMPRESSION: Pt reports that she was able to review educational handouts issued last session for joint protection/cumulative trauma prevention, and reports that she is working to implement recommended strategies at home, including intermittent use of wrist braces, stretching and strengthening exercises, and practicing good body mechanics when grasping ADL supplies.  Pt reports 5/10  pain in bilat hands today.  Pt tolerated paraffin wax, manual therapy, and therapeutic exercises well this date without reports of increased pain.  Pt. continues to benefit from skilled OT to address above noted deficits, focusing on pain management, activity modification, AE training, and development of HEP for improving strength, flexibility, and coordination in bilat hands to increase indep and tolerance to daily tasks.   PERFORMANCE DEFICITS: in functional skills including ADLs, IADLs, coordination, dexterity, sensation, edema, ROM, strength, pain, fascial restrictions, flexibility, Fine motor control, body mechanics, decreased knowledge of precautions, decreased knowledge of use of DME, skin integrity, and UE functional use, and psychosocial skills including coping strategies, environmental adaptation, habits, and routines and behaviors.   IMPAIRMENTS: are limiting patient from ADLs, IADLs, rest and sleep, leisure, and social participation.   COMORBIDITIES: has co-morbidities such as arthritis, carpal tunnel release, trigger finger release, neuropathy that affects occupational performance. Patient will benefit from skilled OT to address above impairments and improve overall function.  MODIFICATION OR ASSISTANCE TO COMPLETE EVALUATION: Min-Moderate modification of tasks or assist with assess necessary  to complete an evaluation.  OT OCCUPATIONAL PROFILE AND HISTORY: Detailed assessment: Review of records and additional review of physical, cognitive, psychosocial history related to current functional performance.  CLINICAL DECISION MAKING: Moderate - several treatment options, min-mod task modification necessary  REHAB POTENTIAL: Good  EVALUATION COMPLEXITY: High      PLAN:  OT FREQUENCY: 1-2x/week  OT DURATION: 12 weeks  PLANNED INTERVENTIONS: 97168 OT Re-evaluation, 97535 self care/ADL training, 02889 therapeutic exercise, 97530 therapeutic activity, 97112 neuromuscular re-education,  97140 manual therapy, 97035 ultrasound, 97018 paraffin, 02989 moist heat, 97010 cryotherapy, 97034 contrast bath, 97750 Physical Performance Testing, 02239 Orthotic Initial, 97763 Orthotic/Prosthetic subsequent, passive range of motion, psychosocial skills training, energy conservation, coping strategies training, patient/family education, and DME and/or AE instructions  RECOMMENDED OTHER SERVICES: None at this time   CONSULTED AND AGREED WITH PLAN OF CARE: Patient  PLAN FOR NEXT SESSION: see above   Inocente Blazing, MS, OTR/L   12/03/2024, 9:39 AM   "

## 2024-12-09 ENCOUNTER — Ambulatory Visit

## 2024-12-11 ENCOUNTER — Ambulatory Visit

## 2024-12-15 ENCOUNTER — Ambulatory Visit

## 2024-12-16 ENCOUNTER — Other Ambulatory Visit: Payer: Self-pay | Admitting: Internal Medicine

## 2024-12-17 ENCOUNTER — Ambulatory Visit: Admitting: Occupational Therapy

## 2024-12-18 ENCOUNTER — Ambulatory Visit

## 2024-12-22 ENCOUNTER — Ambulatory Visit

## 2024-12-24 ENCOUNTER — Ambulatory Visit: Admitting: Occupational Therapy

## 2024-12-29 ENCOUNTER — Ambulatory Visit

## 2025-01-01 ENCOUNTER — Ambulatory Visit: Admitting: Occupational Therapy

## 2025-01-05 ENCOUNTER — Ambulatory Visit

## 2025-01-08 ENCOUNTER — Ambulatory Visit

## 2025-01-12 ENCOUNTER — Ambulatory Visit

## 2025-01-15 ENCOUNTER — Ambulatory Visit: Admitting: Occupational Therapy

## 2025-01-19 ENCOUNTER — Ambulatory Visit

## 2025-01-21 ENCOUNTER — Ambulatory Visit

## 2025-01-26 ENCOUNTER — Ambulatory Visit

## 2025-01-28 ENCOUNTER — Ambulatory Visit

## 2025-02-02 ENCOUNTER — Ambulatory Visit

## 2025-02-05 ENCOUNTER — Ambulatory Visit

## 2025-02-09 ENCOUNTER — Ambulatory Visit

## 2025-02-11 ENCOUNTER — Ambulatory Visit

## 2025-02-16 ENCOUNTER — Ambulatory Visit

## 2025-02-18 ENCOUNTER — Ambulatory Visit

## 2025-02-23 ENCOUNTER — Ambulatory Visit

## 2025-02-25 ENCOUNTER — Ambulatory Visit: Admitting: Occupational Therapy

## 2025-04-09 ENCOUNTER — Ambulatory Visit: Admitting: Physician Assistant
